# Patient Record
Sex: Male | Born: 1935 | Race: White | Hispanic: No | Marital: Married | Smoking: Former smoker
Health system: Southern US, Community
[De-identification: ages and names within clinical notes are randomized; demographics above are authoritative.]

## PROBLEM LIST (undated history)

## (undated) DIAGNOSIS — E785 Hyperlipidemia, unspecified: Secondary | ICD-10-CM

## (undated) DIAGNOSIS — Z860101 Personal history of adenomatous and serrated colon polyps: Secondary | ICD-10-CM

## (undated) DIAGNOSIS — J302 Other seasonal allergic rhinitis: Secondary | ICD-10-CM

## (undated) DIAGNOSIS — E669 Obesity, unspecified: Secondary | ICD-10-CM

## (undated) DIAGNOSIS — E119 Type 2 diabetes mellitus without complications: Secondary | ICD-10-CM

## (undated) DIAGNOSIS — L409 Psoriasis, unspecified: Secondary | ICD-10-CM

## (undated) DIAGNOSIS — N529 Male erectile dysfunction, unspecified: Secondary | ICD-10-CM

## (undated) DIAGNOSIS — R059 Cough, unspecified: Secondary | ICD-10-CM

## (undated) DIAGNOSIS — F17200 Nicotine dependence, unspecified, uncomplicated: Secondary | ICD-10-CM

## (undated) DIAGNOSIS — H3530 Unspecified macular degeneration: Secondary | ICD-10-CM

## (undated) DIAGNOSIS — R233 Spontaneous ecchymoses: Secondary | ICD-10-CM

## (undated) DIAGNOSIS — I10 Essential (primary) hypertension: Secondary | ICD-10-CM

## (undated) DIAGNOSIS — C4491 Basal cell carcinoma of skin, unspecified: Secondary | ICD-10-CM

## (undated) DIAGNOSIS — R05 Cough: Secondary | ICD-10-CM

## (undated) DIAGNOSIS — R238 Other skin changes: Secondary | ICD-10-CM

## (undated) DIAGNOSIS — Z86010 Personal history of colonic polyps: Secondary | ICD-10-CM

## (undated) HISTORY — DX: Hyperlipidemia, unspecified: E78.5

## (undated) HISTORY — PX: TONSILLECTOMY: SUR1361

## (undated) HISTORY — DX: Essential (primary) hypertension: I10

## (undated) HISTORY — DX: Nicotine dependence, unspecified, uncomplicated: F17.200

## (undated) HISTORY — DX: Personal history of adenomatous and serrated colon polyps: Z86.0101

## (undated) HISTORY — PX: POLYPECTOMY: SHX149

## (undated) HISTORY — DX: Psoriasis, unspecified: L40.9

## (undated) HISTORY — DX: Basal cell carcinoma of skin, unspecified: C44.91

## (undated) HISTORY — DX: Personal history of colonic polyps: Z86.010

## (undated) HISTORY — DX: Obesity, unspecified: E66.9

## (undated) HISTORY — DX: Male erectile dysfunction, unspecified: N52.9

---

## 1991-06-29 HISTORY — PX: SKIN CANCER EXCISION: SHX779

## 2003-06-29 HISTORY — PX: COLONOSCOPY: SHX174

## 2003-10-28 LAB — HM COLONOSCOPY

## 2006-04-21 ENCOUNTER — Ambulatory Visit: Payer: Self-pay | Admitting: Family Medicine

## 2007-11-02 ENCOUNTER — Ambulatory Visit: Payer: Self-pay | Admitting: Family Medicine

## 2008-12-02 ENCOUNTER — Ambulatory Visit: Payer: Self-pay | Admitting: Family Medicine

## 2009-01-28 ENCOUNTER — Ambulatory Visit: Payer: Self-pay | Admitting: Family Medicine

## 2009-02-04 ENCOUNTER — Ambulatory Visit: Payer: Self-pay | Admitting: Family Medicine

## 2009-04-22 ENCOUNTER — Ambulatory Visit: Payer: Self-pay | Admitting: Family Medicine

## 2009-09-16 ENCOUNTER — Ambulatory Visit: Payer: Self-pay | Admitting: Family Medicine

## 2010-01-07 ENCOUNTER — Ambulatory Visit: Payer: Self-pay | Admitting: Family Medicine

## 2010-08-03 ENCOUNTER — Ambulatory Visit: Payer: Self-pay | Admitting: Adult Health

## 2011-05-12 ENCOUNTER — Other Ambulatory Visit (INDEPENDENT_AMBULATORY_CARE_PROVIDER_SITE_OTHER): Payer: Medicare Other

## 2011-05-12 DIAGNOSIS — Z23 Encounter for immunization: Secondary | ICD-10-CM

## 2011-06-29 HISTORY — PX: CATARACT EXTRACTION, BILATERAL: SHX1313

## 2011-10-05 ENCOUNTER — Encounter: Payer: Self-pay | Admitting: Internal Medicine

## 2011-10-14 ENCOUNTER — Ambulatory Visit (INDEPENDENT_AMBULATORY_CARE_PROVIDER_SITE_OTHER): Payer: Medicare Other | Admitting: Family Medicine

## 2011-10-14 ENCOUNTER — Encounter: Payer: Self-pay | Admitting: Family Medicine

## 2011-10-14 ENCOUNTER — Telehealth: Payer: Self-pay | Admitting: Internal Medicine

## 2011-10-14 VITALS — BP 150/90 | HR 80 | Ht 73.0 in | Wt 245.0 lb

## 2011-10-14 DIAGNOSIS — Z720 Tobacco use: Secondary | ICD-10-CM | POA: Insufficient documentation

## 2011-10-14 DIAGNOSIS — M25519 Pain in unspecified shoulder: Secondary | ICD-10-CM

## 2011-10-14 DIAGNOSIS — Z86010 Personal history of colonic polyps: Secondary | ICD-10-CM

## 2011-10-14 DIAGNOSIS — J301 Allergic rhinitis due to pollen: Secondary | ICD-10-CM

## 2011-10-14 DIAGNOSIS — Z860101 Personal history of adenomatous and serrated colon polyps: Secondary | ICD-10-CM

## 2011-10-14 DIAGNOSIS — E785 Hyperlipidemia, unspecified: Secondary | ICD-10-CM

## 2011-10-14 DIAGNOSIS — E669 Obesity, unspecified: Secondary | ICD-10-CM

## 2011-10-14 DIAGNOSIS — F17200 Nicotine dependence, unspecified, uncomplicated: Secondary | ICD-10-CM

## 2011-10-14 DIAGNOSIS — I10 Essential (primary) hypertension: Secondary | ICD-10-CM

## 2011-10-14 DIAGNOSIS — E1169 Type 2 diabetes mellitus with other specified complication: Secondary | ICD-10-CM | POA: Insufficient documentation

## 2011-10-14 DIAGNOSIS — Z87891 Personal history of nicotine dependence: Secondary | ICD-10-CM | POA: Insufficient documentation

## 2011-10-14 DIAGNOSIS — M25512 Pain in left shoulder: Secondary | ICD-10-CM

## 2011-10-14 DIAGNOSIS — L408 Other psoriasis: Secondary | ICD-10-CM

## 2011-10-14 DIAGNOSIS — E1159 Type 2 diabetes mellitus with other circulatory complications: Secondary | ICD-10-CM | POA: Insufficient documentation

## 2011-10-14 DIAGNOSIS — L409 Psoriasis, unspecified: Secondary | ICD-10-CM | POA: Insufficient documentation

## 2011-10-14 LAB — COMPREHENSIVE METABOLIC PANEL
Albumin: 4.7 g/dL (ref 3.5–5.2)
BUN: 15 mg/dL (ref 6–23)
CO2: 26 mEq/L (ref 19–32)
Calcium: 9.3 mg/dL (ref 8.4–10.5)
Glucose, Bld: 100 mg/dL — ABNORMAL HIGH (ref 70–99)
Potassium: 4.6 mEq/L (ref 3.5–5.3)
Sodium: 138 mEq/L (ref 135–145)
Total Protein: 7 g/dL (ref 6.0–8.3)

## 2011-10-14 LAB — CBC WITH DIFFERENTIAL/PLATELET
Lymphocytes Relative: 33 % (ref 12–46)
Lymphs Abs: 2 10*3/uL (ref 0.7–4.0)
MCV: 94.8 fL (ref 78.0–100.0)
Neutro Abs: 3 10*3/uL (ref 1.7–7.7)
Neutrophils Relative %: 52 % (ref 43–77)
Platelets: 272 10*3/uL (ref 150–400)
RBC: 4.84 MIL/uL (ref 4.22–5.81)
WBC: 5.9 10*3/uL (ref 4.0–10.5)

## 2011-10-14 LAB — LIPID PANEL
Cholesterol: 201 mg/dL — ABNORMAL HIGH (ref 0–200)
HDL: 38 mg/dL — ABNORMAL LOW (ref >39)
Triglycerides: 186 mg/dL — ABNORMAL HIGH (ref <150)

## 2011-10-14 MED ORDER — AMLODIPINE BESYLATE 5 MG PO TABS: 5.0000 mg | ORAL_TABLET | Freq: Every day | ORAL | Status: DC

## 2011-10-14 MED ORDER — PIROXICAM 20 MG PO CAPS: 20.0000 mg | ORAL_CAPSULE | Freq: Every day | ORAL | Status: DC

## 2011-10-14 NOTE — Telephone Encounter (Signed)
[place] please schedule this pt to see Dr. Russella Dar. Leone Payor and Russella Dar approved the switch.

## 2011-10-14 NOTE — Telephone Encounter (Signed)
Dr. Russella Dar will you accept this patient? See request below. Please advise.

## 2011-10-14 NOTE — Progress Notes (Signed)
Subjective:    Patient ID: Alex Edwards, male    DOB: [birth year], 76 y.o.   MRN: [id number]  HPI He is here for an interval evaluation. He has not been here in several years. He was on medications for blood pressure but stopped due to unacceptable side effects. He does complain of left shoulder pain he states he's had for 25 years. He was told that there was some calcium in it and he states that Feldene use for approximately one month in the past did help with this. He states the pain does get better when he slightly abducts it. He also has a left neck mass that has been there for over 20 years. He states that it does tend to come and go but never go away completely. He does have underlying psoriasis and uses over-the-counter medications for this. He does have underlying allergies and treats this with OTC medications. He does not complain of any erectile dysfunction. He continues to smoke and has no desire to quit. His marriage is going well. He would like to get his eyes checked since it has been quite some time.   Review of Systems Negative except as above.    Objective:   Physical Exam BP 150/90  Pulse 80  Ht 6\' 1"  (1.854 m)  Wt 245 lb (111.131 kg)  BMI 32.32 kg/m2  General Appearance:    Alert, cooperative, no distress, appears stated age  Head:    Normocephalic, without obvious abnormality, atraumatic  Eyes:    PERRL, conjunctiva/corneas clear, EOM's intact, fundi    benign  Ears:    Normal TM's and external ear canals  Nose:   Nares normal, mucosa normal, no drainage or sinus   tenderness  Throat:   Lips, mucosa, and tongue normal; teeth and gums normal  Neck:   Supple, no lymphadenopathy;  thyroid:  no   enlargement/tenderness/nodules; no carotid   bruit or JVD.left neck area has a 3 x 5 cm smooth round fluctuant lesion   Back:    Spine nontender, no curvature, ROM normal, no CVA     tenderness  Lungs:     Clear to auscultation bilaterally without wheezes, rales or     ronchi;  respirations unlabored  Chest Wall:    No tenderness or deformity   Heart:    Regular rate and rhythm, S1 and S2 normal, no murmur, rub   or gallop  Breast Exam:    No chest wall tenderness, masses or gynecomastia  Abdomen:     Soft, non-tender, nondistended, normoactive bowel sounds,    no masses, no hepatosplenomegaly  Genitalia:   deferred   Rectal:   deferred   Extremities:   No clubbing, cyanosis or edema.left shoulder exam shows good motion without crepitus or laxity.   Pulses:   2+ and symmetric all extremities  Skin:   Skin color, texture, turgor normal, no rashes or lesions.2 cm somewhat firm round smooth lesion noted on the mid upper back area.   Lymph nodes:   Cervical, supraclavicular, and axillary nodes normal  Neurologic:   CNII-XII intact, normal strength, sensation and gait; reflexes 2+ and symmetric throughout          Psych:   Normal mood, affect, hygiene and grooming.           Assessment & Plan:   1. Hypertension  Ambulatory referral to Ophthalmology, amLODipine (NORVASC) 5 MG tablet, CBC with Differential, Comprehensive metabolic panel, Lipid panel  2. Hyperlipidemia LDL goal <  100  Lipid panel  3. Psoriasis  CBC with Differential, Comprehensive metabolic panel  4. Obesity (BMI 30-39.9)  CBC with Differential, Comprehensive metabolic panel, Lipid panel  5. Allergic rhinitis due to pollen    6. Current smoker    7. Hx of adenomatous colonic polyps  [facility] COLONOSCOPY  8. Left shoulder pain  piroxicam (FELDENE) 20 MG capsule  9  left neck mass                                                      thus further options concerning this with him and he would prefer not to pursue any further evaluation at this time. He is to let me know if he has any difficulty with his blood pressure medication. At this time he has no interest in quitting smoking.

## 2011-10-14 NOTE — Telephone Encounter (Signed)
OK with me.

## 2011-10-14 NOTE — Patient Instructions (Signed)
Use the Feldene for one month to see if this will help with your shoulder pain. If not, we will get an x-ray. If you have problems with the blood pressure medication, let me know otherwise I will see you in one month.

## 2011-10-14 NOTE — Telephone Encounter (Signed)
Sure

## 2011-10-14 NOTE — Telephone Encounter (Signed)
Dr. Leone Payor, pt would like to switch care to Dr. Russella Dar because that is the doctor his spouse sees. Will you approve the switch? Please advise.

## 2011-10-15 ENCOUNTER — Encounter: Payer: Self-pay | Admitting: Gastroenterology

## 2011-10-15 NOTE — Telephone Encounter (Signed)
Pt scheduled with Russella Dar for colonoscopy and previsit

## 2011-11-10 ENCOUNTER — Encounter: Payer: Self-pay | Admitting: Gastroenterology

## 2011-11-10 ENCOUNTER — Ambulatory Visit (AMBULATORY_SURGERY_CENTER): Payer: Medicare Other | Admitting: *Deleted

## 2011-11-10 VITALS — Ht 73.0 in | Wt 246.5 lb

## 2011-11-10 DIAGNOSIS — Z1211 Encounter for screening for malignant neoplasm of colon: Secondary | ICD-10-CM

## 2011-11-10 MED ORDER — MOVIPREP 100 G PO SOLR: ORAL | Status: DC

## 2011-11-17 ENCOUNTER — Ambulatory Visit (INDEPENDENT_AMBULATORY_CARE_PROVIDER_SITE_OTHER): Payer: Medicare Other | Admitting: Family Medicine

## 2011-11-17 ENCOUNTER — Encounter: Payer: Self-pay | Admitting: Family Medicine

## 2011-11-17 VITALS — BP 136/78 | HR 87 | Wt 247.0 lb

## 2011-11-17 DIAGNOSIS — Z79899 Other long term (current) drug therapy: Secondary | ICD-10-CM

## 2011-11-17 DIAGNOSIS — I10 Essential (primary) hypertension: Secondary | ICD-10-CM

## 2011-11-17 NOTE — Progress Notes (Signed)
  Subjective:    Patient ID: Alex Edwards, male    DOB: Feb 14, 1936, 76 y.o.   MRN: [id number]  HPI He is here for recheck. His had no difficulty from taking Norvasc.   Review of Systems     Objective:   Physical Exam Alert and in no distress. Blood pressure is recorded.       Assessment & Plan:   1. Hypertension   2. Encounter for long-term (current) use of other medications    continue on present medication dosing. Recheck this fall for flu shot

## 2011-11-24 ENCOUNTER — Ambulatory Visit (AMBULATORY_SURGERY_CENTER): Payer: Medicare Other | Admitting: Gastroenterology

## 2011-11-24 ENCOUNTER — Other Ambulatory Visit: Payer: Self-pay | Admitting: Gastroenterology

## 2011-11-24 ENCOUNTER — Encounter: Payer: Self-pay | Admitting: Gastroenterology

## 2011-11-24 VITALS — BP 113/66 | HR 85 | Temp 97.7°F | Resp 20 | Ht 73.0 in | Wt 246.0 lb

## 2011-11-24 DIAGNOSIS — Z860100 Personal history of colon polyps, unspecified: Secondary | ICD-10-CM

## 2011-11-24 DIAGNOSIS — D126 Benign neoplasm of colon, unspecified: Secondary | ICD-10-CM

## 2011-11-24 DIAGNOSIS — Z1211 Encounter for screening for malignant neoplasm of colon: Secondary | ICD-10-CM

## 2011-11-24 DIAGNOSIS — D129 Benign neoplasm of anus and anal canal: Secondary | ICD-10-CM

## 2011-11-24 DIAGNOSIS — D128 Benign neoplasm of rectum: Secondary | ICD-10-CM

## 2011-11-24 MED ORDER — SODIUM CHLORIDE 0.9 % IV SOLN: 500.0000 mL | INTRAVENOUS | Status: DC

## 2011-11-24 NOTE — Progress Notes (Signed)
Patient did not have preoperative order for IV antibiotic SSI prophylaxis. (G8918)  Patient did not experience any of the following events: a burn prior to discharge; a fall within the facility; wrong site/side/patient/procedure/implant event; or a hospital transfer or hospital admission upon discharge from the facility. (G8907)  

## 2011-11-24 NOTE — Patient Instructions (Signed)
YOU HAD AN ENDOSCOPIC PROCEDURE TODAY AT THE [facility]: Refer to the procedure report that was given to you for any specific questions about what was found during the examination.  If the procedure report does not answer your questions, please call your gastroenterologist to clarify.  If you requested that your care partner not be given the details of your procedure findings, then the procedure report has been included in a sealed envelope for you to review at your convenience later.  YOU SHOULD EXPECT: Some feelings of bloating in the abdomen. Passage of more gas than usual.  Walking can help get rid of the air that was put into your GI tract during the procedure and reduce the bloating. If you had a lower endoscopy (such as a colonoscopy or flexible sigmoidoscopy) you may notice spotting of blood in your stool or on the toilet paper. If you underwent a bowel prep for your procedure, then you may not have a normal bowel movement for a few days.  DIET: Your first meal following the procedure should be a light meal and then it is ok to progress to your normal diet.  A half-sandwich or bowl of soup is an example of a good first meal.  Heavy or fried foods are harder to digest and may make you feel nauseous or bloated.  Likewise meals heavy in dairy and vegetables can cause extra gas to form and this can also increase the bloating.  Drink plenty of fluids but you should avoid alcoholic beverages for 24 hours.  ACTIVITY: Your care partner should take you home directly after the procedure.  You should plan to take it easy, moving slowly for the rest of the day.  You can resume normal activity the day after the procedure however you should NOT DRIVE or use heavy machinery for 24 hours (because of the sedation medicines used during the test).    SYMPTOMS TO REPORT IMMEDIATELY: A gastroenterologist can be reached at any hour.  During normal business hours, 8:30 AM to 5:00 PM Monday through Friday,  call [phone].  After hours and on weekends, please call the GI answering service at [phone] who will take a message and have the physician on call contact you.   Following lower endoscopy (colonoscopy or flexible sigmoidoscopy):  Excessive amounts of blood in the stool  Significant tenderness or worsening of abdominal pains  Swelling of the abdomen that is new, acute  Fever of 100F or higher    FOLLOW UP: If any biopsies were taken you will be contacted by phone or by letter within the next 1-3 weeks.  Call your gastroenterologist if you have not heard about the biopsies in 3 weeks.  Our staff will call the home number listed on your records the next business day following your procedure to check on you and address any questions or concerns that you may have at that time regarding the information given to you following your procedure. This is a courtesy call and so if there is no answer at the home number and we have not heard from you through the emergency physician on call, we will assume that you have returned to your regular daily activities without incident.  SIGNATURES/CONFIDENTIALITY: You and/or your care partner have signed paperwork which will be entered into your electronic medical record.  These signatures attest to the fact that that the information above on your After Visit Summary has been reviewed and is understood.  Full responsibility of the confidentiality   of this discharge information lies with you and/or your care-partner.     

## 2011-11-24 NOTE — Op Note (Signed)
[facility] [address] [place], [place]  [address]  COLONOSCOPY PROCEDURE REPORT PATIENT:  Alex Edwards, Alex Edwards  MR#:  [id number] BIRTHDATE:  [birth year], 76 yrs. old  GENDER:  male ENDOSCOPIST:  Judie Petit T. Russella Dar, MD, [facility]  PROCEDURE DATE:  11/24/2011 PROCEDURE:  Colonoscopy with snare polypectomy ASA CLASS:  Class II INDICATIONS:  1) surveillance and high-risk screening  2) history of pre-cancerous (adenomatous) colon polyps : 2005 MEDICATIONS:   MAC sedation, administered by CRNA, propofol (Diprivan) 200 mg IV DESCRIPTION OF PROCEDURE:   After the risks benefits and alternatives of the procedure were thoroughly explained, informed consent was obtained.  Digital rectal exam was performed and revealed no abnormalities.   The LB CF-[id number] endoscope was introduced through the anus and advanced to the cecum, which was identified by both the appendix and ileocecal valve, without limitations.  The quality of the prep was good, using MoviPrep. The instrument was then slowly withdrawn as the colon was fully examined. <<PROCEDUREIMAGES>> FINDINGS:  A sessile polyp was found in the cecum. It was 6 mm in size. Polyp was snared without cautery. Retrieval was successful. A sessile polyp was found in the sigmoid colon. It was 4 mm in size. Polyp was snared without cautery. Retrieval was successful. A pedunculated polyp was found in the rectum. It was 12 mm in size. Polyp was snared, then cauterized with monopolar cautery. Retrieval was successful. Moderate diverticulosis was found in the sigmoid colon. Otherwise normal colonoscopy without other polyps, masses, vascular ectasias, or inflammatory changes. Retroflexed views in the rectum revealed no abnormalities.  The time to cecum =  2.75  minutes. The scope was then withdrawn (time =  11.75 min) from the patient and the procedure completed. COMPLICATIONS:  None  ENDOSCOPIC IMPRESSION: 1) 6 mm sessile polyp in the cecum 2) 4 mm  sessile polyp in the sigmoid colon 3) 12 mm pedunculated polyp in the rectum 4) Moderate diverticulosis in the sigmoid colon  RECOMMENDATIONS: 1) Hold aspirin, aspirin products, and anti-inflammatory medication for 2 weeks. 2) Await pathology results 3) High fiber diet with liberal fluid intake. 4) Repeat Colonoscopy in 3 years.  Venita Lick. Russella Dar, MD, [facility]  CC:  Sharlot Gowda, MD  n. Rosalie DoctorVenita Lick. Tyrena Gohr at 11/24/2011 11:23 AM  Dorie Rank, [phone]

## 2011-11-25 ENCOUNTER — Telehealth: Payer: Self-pay | Admitting: *Deleted

## 2011-11-25 NOTE — Telephone Encounter (Signed)
  Follow up Call-  Call back number 11/24/2011  Post procedure Call Back phone  # [phone]  Permission to leave phone message Yes     Patient questions:  Do you have a fever, pain , or abdominal swelling? no Pain Score  0 *  Have you tolerated food without any problems? yes  Have you been able to return to your normal activities? yes  Do you have any questions about your discharge instructions: Diet   no Medications  no Follow up visit  no  Do you have questions or concerns about your Care? no  Actions: * If pain score is 4 or above: No action needed, pain <4.

## 2011-11-29 ENCOUNTER — Encounter: Payer: Self-pay | Admitting: Gastroenterology

## 2012-02-11 ENCOUNTER — Ambulatory Visit (INDEPENDENT_AMBULATORY_CARE_PROVIDER_SITE_OTHER): Payer: Medicare Other | Admitting: Medical

## 2012-02-11 ENCOUNTER — Encounter: Payer: Self-pay | Admitting: Medical

## 2012-02-11 VITALS — BP 140/80 | HR 93 | Temp 98.1°F | Resp 16 | Wt 244.0 lb

## 2012-02-11 DIAGNOSIS — Z23 Encounter for immunization: Secondary | ICD-10-CM

## 2012-02-11 DIAGNOSIS — S91109A Unspecified open wound of unspecified toe(s) without damage to nail, initial encounter: Secondary | ICD-10-CM

## 2012-02-11 DIAGNOSIS — S91139A Puncture wound without foreign body of unspecified toe(s) without damage to nail, initial encounter: Secondary | ICD-10-CM

## 2012-02-11 MED ORDER — AMOXICILLIN-POT CLAVULANATE 875-125 MG PO TABS: 1.0000 | ORAL_TABLET | Freq: Two times a day (BID) | ORAL | Status: AC

## 2012-02-11 NOTE — Progress Notes (Signed)
Subjective: Here for wound.  He was out in the shrubs at his house this morning and stepped on a roofing nail.  He just had his roof replaced recently.  The nail entered his left great toe.  He cleaned the toe and came in here for tetanus booster.   No other c/o.  He does report having right eye cataract surgery recently and had an injection in his eye for macular degeneration.    ROS as noted in HPI  Objective: Gen: wd, wn, nad Skin: left great toe volar surface with 2mm diameter puncture wound.  Some bloody drainage.  No pus, erythema, induration, fluctuance.  Rest of toes intact.  Normal pedal pulse and normal cap refill  Assessment: Encounter Diagnoses  Name Primary?  . Puncture wound of toe Yes  . Need for Tdap vaccination    Plan: Puncture wound -advised he clean the area with soap and water, use epsom salt soaks, updated Tdap today.  Wrote script for Augmentin in the event he begins to have signs of infection as discussed.  otherwise elevate the leg, and call/return if worse or not improving.

## 2012-03-22 ENCOUNTER — Ambulatory Visit: Payer: Medicare Other | Admitting: Family Medicine

## 2012-03-28 ENCOUNTER — Ambulatory Visit
Admission: RE | Admit: 2012-03-28 | Discharge: 2012-03-28 | Disposition: A | Discharge status: Home / Self Care | Payer: Medicare Other | Source: Ambulatory Visit | Attending: Family Medicine | Admitting: Family Medicine

## 2012-03-28 ENCOUNTER — Encounter: Payer: Self-pay | Admitting: Family Medicine

## 2012-03-28 ENCOUNTER — Ambulatory Visit (INDEPENDENT_AMBULATORY_CARE_PROVIDER_SITE_OTHER): Payer: Medicare Other | Admitting: Family Medicine

## 2012-03-28 VITALS — BP 140/80 | HR 84 | Wt 247.0 lb

## 2012-03-28 DIAGNOSIS — M25552 Pain in left hip: Secondary | ICD-10-CM

## 2012-03-28 DIAGNOSIS — N529 Male erectile dysfunction, unspecified: Secondary | ICD-10-CM

## 2012-03-28 DIAGNOSIS — M25559 Pain in unspecified hip: Secondary | ICD-10-CM

## 2012-03-28 DIAGNOSIS — F17200 Nicotine dependence, unspecified, uncomplicated: Secondary | ICD-10-CM

## 2012-03-28 DIAGNOSIS — Z23 Encounter for immunization: Secondary | ICD-10-CM

## 2012-03-28 MED ORDER — INFLUENZA VIRUS VACC SPLIT PF IM SUSP: 0.5000 mL | Freq: Once | INTRAMUSCULAR | Status: DC

## 2012-03-28 MED ORDER — SILDENAFIL CITRATE 100 MG PO TABS: 50.0000 mg | ORAL_TABLET | Freq: Every day | ORAL | Status: DC | PRN

## 2012-03-28 NOTE — Progress Notes (Signed)
  Subjective:    Patient ID: Alex Edwards, male    DOB: 1936-02-26, 76 y.o.   MRN: [id number]  HPI He is here for a followup visit. He has noted increased difficulty with left hip pain but does get relief with using Aleve on an as-needed basis. He also has had difficulty with left shoulder pain and states he has a previous history of calcific tendinitis. At this time he is not having a great deal of difficulty with ADLs. He also has erectile dysfunction and would like to try Viagra. He has tried Levitra in the past without success. He continues to smoke and at this time is not interested in quitting.  Review of Systems     Objective:   Physical Exam Alert and in no distress. Good motion of the shoulder. No palpable tenderness noted. Hip motion shows some lack of abduction but otherwise good motion.       Assessment & Plan:   1. Need for prophylactic vaccination and inoculation against influenza  influenza  inactive virus vaccine (FLUZONE/FLUARIX) injection 0.5 mL, influenza  inactive virus vaccine (FLUZONE/FLUARIX) injection 0.5 mL  2. Current smoker    3. ED (erectile dysfunction)  sildenafil (VIAGRA) 100 MG tablet  4. Left hip pain  DG Hip Complete Left   hip x-ray shows mild degenerative changes. Recommend conservative care. Also recommend conservative care for the shoulder which is in agreement with. He is not interested in quitting smoking. Sample of Viagra with instructions on use and possible side effects was given.

## 2012-03-28 NOTE — Patient Instructions (Signed)
You can take 2 Tylenol 4 times per day if you need to for the pain as well as Aleve

## 2012-04-10 ENCOUNTER — Telehealth: Payer: Self-pay | Admitting: Family Medicine

## 2012-04-10 ENCOUNTER — Other Ambulatory Visit: Payer: Self-pay | Admitting: Family Medicine

## 2012-04-10 NOTE — Telephone Encounter (Signed)
Pt request [facility]

## 2012-04-10 NOTE — Telephone Encounter (Signed)
If he is having as much difficulty, refer him to orthopedics

## 2012-04-10 NOTE — Telephone Encounter (Signed)
Pt called and stated that he is still having a lot of pain with is hip. He states that he needs "releif". Pt want to know what next. Are you going to treat him or does he need to go to a specialist? Please call pt. Pt uses [facility].

## 2012-06-26 ENCOUNTER — Encounter: Payer: Self-pay | Admitting: Medical

## 2012-06-26 ENCOUNTER — Ambulatory Visit (INDEPENDENT_AMBULATORY_CARE_PROVIDER_SITE_OTHER): Payer: Medicare Other | Admitting: Medical

## 2012-06-26 VITALS — BP 152/70 | HR 80 | Temp 98.0°F | Resp 16 | Wt 259.0 lb

## 2012-06-26 DIAGNOSIS — R21 Rash and other nonspecific skin eruption: Secondary | ICD-10-CM

## 2012-06-26 MED ORDER — METHYLPREDNISOLONE ACETATE 80 MG/ML IJ SUSP
60.0000 mg | Freq: Once | INTRAMUSCULAR | Status: AC
  Administered 2012-06-26: 60 mg via INTRAMUSCULAR

## 2012-06-26 MED ORDER — HYDROCORTISONE 2.5 % EX CREA: TOPICAL_CREAM | Freq: Two times a day (BID) | CUTANEOUS | Status: DC

## 2012-06-26 NOTE — Progress Notes (Signed)
Subjective: Here for rash on both hands.  He notes hx/o psoriasis, usually with problems of scalp and elbows, but for the last few weeks having rash on both hands, raw red and irritated.  He has been using selsun blue shampoo on scalp and hand rash seemed to get worse after this.   He also had an Fish farm manager at his house recently on Christmas eve, used Government social research officer and the right hand rash worsened after this.   Used some halobetasol but this causes skin to peel off.   He sees Dr. Margo Aye dermatology in the past and had similar hand rash in the past that cleared with steroid shot which is what he requests today.  other prior improvements include "glove in a bottle" lotion.   Objective: Gen: wd, wn, nad Skin: bilat hands dorsla and volar surfaces throughout with erythema, some flaky/scaly skin of dorsal fingers, but mostly irritated inflamed appearing fingers and hands.     Assessment: Encounter Diagnosis  Name Primary?  . Rash Yes   Plan: Depo medrol 60mg  IM given today, script for Hydrocortisone 2.5% cream topically advised to hands.   In general use warm gloves when out in the cold. . Use daily moisturizing lotion such as gloves in a bottle or lubriderm.  Avoid cold temperatures in general.   Recheck if worse or not improving.

## 2012-07-10 ENCOUNTER — Encounter: Payer: Self-pay | Admitting: Medical

## 2012-07-10 ENCOUNTER — Ambulatory Visit (INDEPENDENT_AMBULATORY_CARE_PROVIDER_SITE_OTHER): Payer: Medicare Other | Admitting: Medical

## 2012-07-10 VITALS — BP 130/70 | HR 72 | Temp 97.8°F | Resp 16 | Wt 245.0 lb

## 2012-07-10 DIAGNOSIS — R21 Rash and other nonspecific skin eruption: Secondary | ICD-10-CM

## 2012-07-10 DIAGNOSIS — R221 Localized swelling, mass and lump, neck: Secondary | ICD-10-CM

## 2012-07-10 DIAGNOSIS — M129 Arthropathy, unspecified: Secondary | ICD-10-CM

## 2012-07-10 DIAGNOSIS — J40 Bronchitis, not specified as acute or chronic: Secondary | ICD-10-CM

## 2012-07-10 DIAGNOSIS — R220 Localized swelling, mass and lump, head: Secondary | ICD-10-CM

## 2012-07-10 DIAGNOSIS — M1990 Unspecified osteoarthritis, unspecified site: Secondary | ICD-10-CM

## 2012-07-10 MED ORDER — CLARITHROMYCIN ER 500 MG PO TB24: 1000.0000 mg | ORAL_TABLET | Freq: Every day | ORAL | Status: DC

## 2012-07-10 MED ORDER — ACETAMINOPHEN-CODEINE #3 300-30 MG PO TABS: ORAL_TABLET | ORAL | Status: DC

## 2012-07-10 MED ORDER — METHYLPREDNISOLONE ACETATE 40 MG/ML INJ SUSP (RADIOLOG
120.0000 mg | Freq: Once | INTRAMUSCULAR | Status: AC
  Administered 2012-07-10: 120 mg via INTRAMUSCULAR

## 2012-07-10 MED ORDER — AZITHROMYCIN 250 MG PO TABS: ORAL_TABLET | ORAL | Status: DC

## 2012-07-10 NOTE — Progress Notes (Signed)
I called out [facility] per Crosby Oyster PA-C because the Biaxin medication cost to much. CLS

## 2012-07-10 NOTE — Progress Notes (Signed)
Subjective: Here for 2+ week hx/o cough, chest congestion, some colored sputum.  Wife has same.  She was seen by me today for the same.   Neither of them seem to be getting over it.   She is a little worse than him.    He wants refill on tylenol #3.  Uses occasionally for arthritis pain, not daily though.    Still has rash on hands.   After last visit had some improvement, but not as good as in the past with stronger steroid shot.  He did use the hydrocortisone cream.   Would like another round of shot. Has hx/o psoriasis, usually with problems of scalp and elbows, but for the last few weeks having rash on both hands, raw red and irritated.    Has lump of left neck x 12 years.  curious about this.   Doesn't bother him.  Had discussed with Dr. Susann Givens before, but just curious about this.    Objective: Gen: wd, wn, nad Heent: sinus nontender, TMs pearly, nares patent, pharynx normal Heart: RRR, normal S1, S2, no mumrus Lungs; left lower rhonchi, no rales, no wheezes, no consolidations or dullness to percussion Neck: left neck with 4+cm round large mass, seems round and cystic like, possible cyst or enlarged lymph node, nontender, but no erythema, warmth, fluctuance, otherwise no thyromegaly or mass, nontender Skin: bilat hands dorsal and volar surfaces throughout with erythema, some flaky/scaly skin of dorsal fingers, but mostly irritated inflamed appearing fingers and hands.     Assessment: Encounter Diagnoses  Name Primary?  . Bronchitis Yes  . Mass of left side of neck   . Rash of hands   . Arthritis    Plan: Bronchitis - shot of depo medrol today, begin Biaxin, call/return if not much improved by end of the week  Mass of neck - offered soft tissue ultrasound, but he declines today.   Rash of hands - depo medrol 120mg  today.  F/u with dermatology if this doesn't clear.  C/t daily moisturizing lotion, wears gloves in the cold  Arthritis - refill on tylenol #3.  Return soon for  physical

## 2012-07-10 NOTE — Addendum Note (Signed)
Addended by: Janeice Robinson on: 07/10/2012 03:29 PM   Modules accepted: Orders

## 2012-09-08 ENCOUNTER — Encounter: Payer: Self-pay | Admitting: Family Medicine

## 2012-09-08 ENCOUNTER — Ambulatory Visit: Payer: Medicare Other | Admitting: Family Medicine

## 2012-09-08 VITALS — BP 170/80 | HR 80 | Wt 249.0 lb

## 2012-09-08 DIAGNOSIS — L258 Unspecified contact dermatitis due to other agents: Secondary | ICD-10-CM

## 2012-09-08 DIAGNOSIS — L309 Dermatitis, unspecified: Secondary | ICD-10-CM

## 2012-09-08 MED ORDER — METHYLPREDNISOLONE SODIUM SUCC 125 MG IJ SOLR: 125.0000 mg | Freq: Once | INTRAMUSCULAR | Status: DC

## 2012-09-08 MED ORDER — PREDNISONE 5 MG PO KIT: PACK | ORAL | Status: DC

## 2012-09-08 MED ORDER — METHYLPREDNISOLONE SODIUM SUCC 125 MG IJ SOLR
125.0000 mg | Freq: Once | INTRAMUSCULAR | Status: AC
  Administered 2012-09-08: 125 mg via INTRAMUSCULAR

## 2012-09-08 NOTE — Progress Notes (Signed)
  Subjective:    Patient ID: Alex Edwards, male    DOB: 28-Mar-1936, 78 y.o.   MRN: [id number]  HPI He is here for evaluation of continued difficulty with redness swelling dryness and cracking of his hands. He has been seen in the past by Dr. Margo Aye. Apparently the only thing that helps clear the problem is steroids orally.   Review of Systems     Objective:   Physical Exam Both hands are erythematous slightly warm, dry with fissuring on the palmar surface.       Assessment & Plan:  Dermatitis - Plan: PredniSONE 5 MG KIT, methylPREDNISolone sodium succinate (SOLU-MEDROL) 125 mg/2 mL injection 125 mg, methylPREDNISolone sodium succinate (SOLU-MEDROL) 125 mg/2 mL injection 125 mg, DISCONTINUED: methylPREDNISolone sodium succinate (SOLU-MEDROL) 125 mg/2 mL injection, CANCELED: PR METHYLPREDNISOLONE INJECTION if he continues to have difficulty, I will refer to dermatology

## 2013-01-12 ENCOUNTER — Ambulatory Visit (INDEPENDENT_AMBULATORY_CARE_PROVIDER_SITE_OTHER): Payer: Medicare Other | Admitting: Family Medicine

## 2013-01-12 ENCOUNTER — Encounter: Payer: Self-pay | Admitting: Family Medicine

## 2013-01-12 ENCOUNTER — Telehealth: Payer: Self-pay | Admitting: Family Medicine

## 2013-01-12 VITALS — BP 150/80 | HR 82 | Temp 98.0°F | Resp 16 | Wt 248.0 lb

## 2013-01-12 DIAGNOSIS — J301 Allergic rhinitis due to pollen: Secondary | ICD-10-CM

## 2013-01-12 DIAGNOSIS — F17200 Nicotine dependence, unspecified, uncomplicated: Secondary | ICD-10-CM

## 2013-01-12 DIAGNOSIS — L409 Psoriasis, unspecified: Secondary | ICD-10-CM

## 2013-01-12 DIAGNOSIS — Z79899 Other long term (current) drug therapy: Secondary | ICD-10-CM

## 2013-01-12 DIAGNOSIS — I10 Essential (primary) hypertension: Secondary | ICD-10-CM

## 2013-01-12 DIAGNOSIS — L408 Other psoriasis: Secondary | ICD-10-CM

## 2013-01-12 NOTE — Telephone Encounter (Signed)
WILL BE SENT MONDAY

## 2013-01-12 NOTE — Progress Notes (Signed)
  Subjective:    Patient ID: Alex Edwards, male    DOB: 1936-04-17, 77 y.o.   MRN: [id number]  HPI He is here for medication check. He continues on his blood pressure medications without problem although he did stop it for a while thinking it might be contributing to his skin problem. His allergies are under good control. He continues to smoke and is not interested in quitting. He has been having difficulty with psoriasis especially of the hands. He has seen his dermatologist was placed in a Sterapred dose pack which did help however followup steroid creams apparently did not. He is interested in another steroid dose pack.   Review of Systems     Objective:   Physical Exam Alert and in no distress. Both hands show redness, scaling and dryness with some cracking. He also has psoriatic lesions present on his arms and legs.       Assessment & Plan:  Psoriasis - Plan: CBC with Differential, Comprehensive metabolic panel  Hypertension - Plan: CBC with Differential, Comprehensive metabolic panel  Allergic rhinitis due to pollen  Smoker  Encounter for long-term (current) use of other medications - Plan: CBC with Differential, Comprehensive metabolic panel  I explained that I did not feel comfortable giving him another steroid dose pack and have him check with his dermatologist. She is considering using methotrexate and I explained that I thought that would be a good option. He will continue on his other medications.

## 2013-01-13 LAB — CBC WITH DIFFERENTIAL/PLATELET
Basophils Relative: 0 % (ref 0–1)
Eosinophils Absolute: 0.2 10*3/uL (ref 0.0–0.7)
Eosinophils Relative: 5 % (ref 0–5)
Lymphs Abs: 1.5 10*3/uL (ref 0.7–4.0)
MCH: 31.4 pg (ref 26.0–34.0)
MCHC: 34.7 g/dL (ref 30.0–36.0)
MCV: 90.5 fL (ref 78.0–100.0)
Monocytes Relative: 13 % — ABNORMAL HIGH (ref 3–12)
Neutrophils Relative %: 49 % (ref 43–77)
Platelets: 237 10*3/uL (ref 150–400)
RBC: 4.72 MIL/uL (ref 4.22–5.81)

## 2013-01-13 LAB — COMPREHENSIVE METABOLIC PANEL
Alkaline Phosphatase: 58 U/L (ref 39–117)
CO2: 28 mEq/L (ref 19–32)
Creat: 0.95 mg/dL (ref 0.50–1.35)
Glucose, Bld: 98 mg/dL (ref 70–99)
Sodium: 138 mEq/L (ref 135–145)
Total Bilirubin: 0.5 mg/dL (ref 0.3–1.2)

## 2013-01-15 NOTE — Progress Notes (Signed)
Quick Note:  MAILED PT LETTER WITH LAB RESULTS PER PT REQUEST ______

## 2013-01-16 NOTE — Telephone Encounter (Signed)
DONE

## 2013-04-02 ENCOUNTER — Ambulatory Visit (INDEPENDENT_AMBULATORY_CARE_PROVIDER_SITE_OTHER): Payer: Medicare Other | Admitting: Family Medicine

## 2013-04-02 DIAGNOSIS — Z23 Encounter for immunization: Secondary | ICD-10-CM | POA: Diagnosis not present

## 2013-04-17 ENCOUNTER — Encounter: Payer: Self-pay | Admitting: Family Medicine

## 2013-04-17 ENCOUNTER — Ambulatory Visit (INDEPENDENT_AMBULATORY_CARE_PROVIDER_SITE_OTHER): Payer: Medicare Other | Admitting: Family Medicine

## 2013-04-17 VITALS — BP 132/78 | HR 108 | Temp 99.4°F | Wt 246.0 lb

## 2013-04-17 DIAGNOSIS — J209 Acute bronchitis, unspecified: Secondary | ICD-10-CM

## 2013-04-17 MED ORDER — AMOXICILLIN 875 MG PO TABS: 875.0000 mg | ORAL_TABLET | Freq: Two times a day (BID) | ORAL | Status: DC

## 2013-04-17 NOTE — Progress Notes (Signed)
  Subjective:    Patient ID: Alex Frederickson Sr., male    DOB: 01-May-1936, 77 y.o.   MRN: [id number]  HPI About 8 days ago he started having difficulty with chest congestion and coughing some fever and chills. No sore or earache. Today he did note difficulty with shortness of breath when he was in the shower . He states that he quit smoking 2 days ago.   Review of Systems     Objective:   Physical Exam alert and in no distress. Tympanic membranes and canals are normal. Throat is clear. Tonsils are normal. Neck is supple without adenopathy or thyromegaly. Cardiac exam shows a regular sinus rhythm without murmurs or gallops. Lungs are clear to auscultation.        Assessment & Plan:  Acute bronchitis - Plan: amoxicillin (AMOXIL) 875 MG tablet  he will call if not entirely better.

## 2013-04-17 NOTE — Patient Instructions (Signed)
Take all the medicine and if not any better, call me.

## 2013-04-18 ENCOUNTER — Encounter: Payer: Self-pay | Admitting: Family Medicine

## 2013-04-19 ENCOUNTER — Encounter: Payer: Self-pay | Admitting: Family Medicine

## 2013-05-14 ENCOUNTER — Other Ambulatory Visit: Payer: Self-pay | Admitting: Family Medicine

## 2013-11-23 ENCOUNTER — Other Ambulatory Visit (HOSPITAL_COMMUNITY): Payer: Self-pay | Admitting: Orthopaedic Surgery

## 2013-12-19 ENCOUNTER — Encounter (HOSPITAL_COMMUNITY): Payer: Self-pay | Admitting: Pharmacy Technician

## 2013-12-25 ENCOUNTER — Telehealth: Payer: Self-pay

## 2013-12-25 ENCOUNTER — Encounter (HOSPITAL_COMMUNITY)
Admission: RE | Admit: 2013-12-25 | Discharge: 2013-12-25 | Disposition: A | Discharge status: Home / Self Care | Payer: Medicare Other | Source: Ambulatory Visit | Attending: Orthopaedic Surgery | Admitting: Orthopaedic Surgery

## 2013-12-25 ENCOUNTER — Encounter (INDEPENDENT_AMBULATORY_CARE_PROVIDER_SITE_OTHER): Payer: Self-pay

## 2013-12-25 ENCOUNTER — Ambulatory Visit (HOSPITAL_COMMUNITY)
Admission: RE | Admit: 2013-12-25 | Discharge: 2013-12-25 | Disposition: A | Discharge status: Home / Self Care | Payer: Medicare Other | Source: Ambulatory Visit | Attending: Orthopaedic Surgery | Admitting: Orthopaedic Surgery

## 2013-12-25 ENCOUNTER — Encounter (HOSPITAL_COMMUNITY): Payer: Self-pay

## 2013-12-25 DIAGNOSIS — Z01818 Encounter for other preprocedural examination: Secondary | ICD-10-CM | POA: Insufficient documentation

## 2013-12-25 DIAGNOSIS — Z01812 Encounter for preprocedural laboratory examination: Secondary | ICD-10-CM | POA: Insufficient documentation

## 2013-12-25 DIAGNOSIS — Z01810 Encounter for preprocedural cardiovascular examination: Secondary | ICD-10-CM | POA: Insufficient documentation

## 2013-12-25 HISTORY — DX: Cough, unspecified: R05.9

## 2013-12-25 HISTORY — DX: Cough: R05

## 2013-12-25 LAB — SURGICAL PCR SCREEN
MRSA, PCR: NEGATIVE
STAPHYLOCOCCUS AUREUS: NEGATIVE

## 2013-12-25 LAB — BASIC METABOLIC PANEL
BUN: 16 mg/dL (ref 6–23)
CALCIUM: 9.5 mg/dL (ref 8.4–10.5)
CHLORIDE: 101 meq/L (ref 96–112)
CO2: 26 mEq/L (ref 19–32)
Creatinine, Ser: 0.94 mg/dL (ref 0.50–1.35)
GFR calc non Af Amer: 79 mL/min — ABNORMAL LOW (ref >90)
Glucose, Bld: 88 mg/dL (ref 70–99)
Potassium: 4.6 mEq/L (ref 3.7–5.3)
Sodium: 139 mEq/L (ref 137–147)

## 2013-12-25 LAB — URINALYSIS, ROUTINE W REFLEX MICROSCOPIC
Bilirubin Urine: NEGATIVE
Glucose, UA: NEGATIVE mg/dL
Hgb urine dipstick: NEGATIVE
Ketones, ur: NEGATIVE mg/dL
LEUKOCYTES UA: NEGATIVE
Nitrite: NEGATIVE
Protein, ur: NEGATIVE mg/dL
SPECIFIC GRAVITY, URINE: 1.003 — AB (ref 1.005–1.030)
UROBILINOGEN UA: 0.2 mg/dL (ref 0.0–1.0)
pH: 6 (ref 5.0–8.0)

## 2013-12-25 LAB — PROTIME-INR
INR: 0.92 (ref 0.00–1.49)
Prothrombin Time: 12.4 seconds (ref 11.6–15.2)

## 2013-12-25 LAB — CBC
HCT: 45.1 % (ref 39.0–52.0)
Hemoglobin: 15.6 g/dL (ref 13.0–17.0)
MCH: 31.9 pg (ref 26.0–34.0)
MCHC: 34.6 g/dL (ref 30.0–36.0)
MCV: 92.2 fL (ref 78.0–100.0)
Platelets: 237 10*3/uL (ref 150–400)
RBC: 4.89 MIL/uL (ref 4.22–5.81)
RDW: 12.4 % (ref 11.5–15.5)
WBC: 5.2 10*3/uL (ref 4.0–10.5)

## 2013-12-25 LAB — APTT: aPTT: 33 seconds (ref 24–37)

## 2013-12-25 NOTE — Pre-Procedure Instructions (Signed)
12-25-13 1635 CXR abnormal results- viewable in Epic.

## 2013-12-25 NOTE — Telephone Encounter (Signed)
CALLED AND TALKED WITH PT THAT DR.LALONDE WANTED HIM TO COME IN AND DISCUSS THE OSA PT SAID THERE WAS NOTHING TO DISCUSS IF HE WANTED HIM TO COME IN FOR SOMETHING ELSE HE WOULD BUT HE IS HAVING LEFT HIP REPLACEMENT ON 01/04/14

## 2013-12-25 NOTE — Patient Instructions (Addendum)
[place]  12/25/2013   Your procedure is scheduled on:  7-10 -2015  Enter through [facility] Entrance and follow signs to [facility]. Arrive at    0930    AM.  Call this number if you have problems the morning of surgery: [phone]  Or Presurgical Testing [phone]([place]) For Living Will and/or Health Care Power Attorney Forms: please provide copy for your medical record,may bring AM of surgery(Forms should be already notarized -we do not provide this service).(12-25-13 No information preferred today).     Do not eat food:After Midnight.    Take these medicines the morning of surgery with A SIP OF WATER: Amlodipine. Loratidine.  Pain med   Do not wear jewelry, make-up or nail polish.  Do not wear lotions, powders, or perfumes. You may wear deodorant.  Do not shave 48 hours(2 days) prior to first CHG shower(legs and under arms).(Shaving face and neck okay.)  Do not bring valuables to the hospital.(Hospital is not responsible for lost valuables).  Contacts, dentures or removable bridgework, body piercing, hair pins may not be worn into surgery.  Leave suitcase in the car. After surgery it may be brought to your room.  For patients admitted to the hospital, checkout time is 11:00 AM the day of discharge.(Restricted visitors-Any Persons displaying flu-like symptoms or illness).    Patients discharged the day of surgery will not be allowed to drive home. Must have responsible person with you x 24 hours once discharged.  Name and phone number of your driver: Cyndia Diver [phone] spouse  Special Instructions: CHG(Chlorhedine 4%-"Hibiclens","Betasept","Aplicare") Shower Use Special Wash: see special instructions.(avoid face and genitals)   Please read over the following fact sheets that you were given: MRSA Information, Blood Transfusion fact sheet, Incentive Spirometry Instruction.  Remember : Type/Screen "Blue armbands" - may not be removed once  applied(would result in being retested AM of surgery, if removed).    ________________________    [facility] - Preparing for Surgery Before surgery, you can play an important role.  Because skin is not sterile, your skin needs to be as free of germs as possible.  You can reduce the number of germs on your skin by washing with CHG (chlorahexidine gluconate) soap before surgery.  CHG is an antiseptic cleaner which kills germs and bonds with the skin to continue killing germs even after washing. Please DO NOT use if you have an allergy to CHG or antibacterial soaps.  If your skin becomes reddened/irritated stop using the CHG and inform your nurse when you arrive at Short Stay. Do not shave (including legs and underarms) for at least 48 hours prior to the first CHG shower.  You may shave your face/neck. Please follow these instructions carefully:  1.  Shower with CHG Soap the night before surgery and the  morning of Surgery.  2.  If you choose to wash your hair, wash your hair first as usual with your  normal  shampoo.  3.  After you shampoo, rinse your hair and body thoroughly to remove the  shampoo.                           4.  Use CHG as you would any other liquid soap.  You can apply chg directly  to the skin and wash  Gently with a scrungie or clean washcloth.  5.  Apply the CHG Soap to your body ONLY FROM THE NECK DOWN.   Do not use on face/ open                           Wound or open sores. Avoid contact with eyes, ears mouth and genitals (private parts).                       Wash face,  Genitals (private parts) with your normal soap.             6.  Wash thoroughly, paying special attention to the area where your surgery  will be performed.  7.  Thoroughly rinse your body with warm water from the neck down.  8.  DO NOT shower/wash with your normal soap after using and rinsing off  the CHG Soap.                9.  Pat yourself dry with a clean towel.            10.   Wear clean pajamas.            11.  Place clean sheets on your bed the night of your first shower and do not  sleep with pets. Day of Surgery : Do not apply any lotions/deodorants the morning of surgery.  Please wear clean clothes to the hospital/surgery center.  FAILURE TO FOLLOW THESE INSTRUCTIONS MAY RESULT IN THE CANCELLATION OF YOUR SURGERY PATIENT SIGNATURE_________________________________  NURSE SIGNATURE__________________________________  ________________________________________________________________________   Adam Phenix  An incentive spirometer is a tool that can help keep your lungs clear and active. This tool measures how well you are filling your lungs with each breath. Taking long deep breaths may help reverse or decrease the chance of developing breathing (pulmonary) problems (especially infection) following:  A long period of time when you are unable to move or be active. BEFORE THE PROCEDURE   If the spirometer includes an indicator to show your best effort, your nurse or respiratory therapist will set it to a desired goal.  If possible, sit up straight or lean slightly forward. Try not to slouch.  Hold the incentive spirometer in an upright position. INSTRUCTIONS FOR USE  1. Sit on the edge of your bed if possible, or sit up as far as you can in bed or on a chair. 2. Hold the incentive spirometer in an upright position. 3. Breathe out normally. 4. Place the mouthpiece in your mouth and seal your lips tightly around it. 5. Breathe in slowly and as deeply as possible, raising the piston or the ball toward the top of the column. 6. Hold your breath for 3-5 seconds or for as long as possible. Allow the piston or ball to fall to the bottom of the column. 7. Remove the mouthpiece from your mouth and breathe out normally. 8. Rest for a few seconds and repeat Steps 1 through 7 at least 10 times every 1-2 hours when you are awake. Take your time and take a few  normal breaths between deep breaths. 9. The spirometer may include an indicator to show your best effort. Use the indicator as a goal to work toward during each repetition. 10. After each set of 10 deep breaths, practice coughing to be sure your lungs are clear. If you have an incision (the cut made at the time of  surgery), support your incision when coughing by placing a pillow or rolled up towels firmly against it. Once you are able to get out of bed, walk around indoors and cough well. You may stop using the incentive spirometer when instructed by your caregiver.  RISKS AND COMPLICATIONS  Take your time so you do not get dizzy or light-headed.  If you are in pain, you may need to take or ask for pain medication before doing incentive spirometry. It is harder to take a deep breath if you are having pain. AFTER USE  Rest and breathe slowly and easily.  It can be helpful to keep track of a log of your progress. Your caregiver can provide you with a simple table to help with this. If you are using the spirometer at home, follow these instructions: [place] IF:   You are having difficultly using the spirometer.  You have trouble using the spirometer as often as instructed.  Your pain medication is not giving enough relief while using the spirometer.  You develop fever of 100.5 F (38.1 C) or higher. SEEK IMMEDIATE MEDICAL CARE IF:   You cough up bloody sputum that had not been present before.  You develop fever of 102 F (38.9 C) or greater.  You develop worsening pain at or near the incision site. MAKE SURE YOU:   Understand these instructions.  Will watch your condition.  Will get help right away if you are not doing well or get worse. Document Released: 10/25/2006 Document Revised: 09/06/2011 Document Reviewed: 12/26/2006 Exit[employer] Patient Information 2014 Exit[employer], [place].   ________________________________________________________________________  WHAT IS A  BLOOD TRANSFUSION? Blood Transfusion Information  A transfusion is the replacement of blood or some of its parts. Blood is made up of multiple cells which provide different functions.  Red blood cells carry oxygen and are used for blood loss replacement.  White blood cells fight against infection.  Platelets control bleeding.  Plasma helps clot blood.  Other blood products are available for specialized needs, such as hemophilia or other clotting disorders. BEFORE THE TRANSFUSION  Who gives blood for transfusions?   Healthy volunteers who are fully evaluated to make sure their blood is safe. This is blood bank blood. Transfusion therapy is the safest it has ever been in the practice of medicine. Before blood is taken from a donor, a complete history is taken to make sure that person has no history of diseases nor engages in risky social behavior (examples are intravenous drug use or sexual activity with multiple partners). The donor's travel history is screened to minimize risk of transmitting infections, such as malaria. The donated blood is tested for signs of infectious diseases, such as HIV and hepatitis. The blood is then tested to be sure it is compatible with you in order to minimize the chance of a transfusion reaction. If you or a relative donates blood, this is often done in anticipation of surgery and is not appropriate for emergency situations. It takes many days to process the donated blood. RISKS AND COMPLICATIONS Although transfusion therapy is very safe and saves many lives, the main dangers of transfusion include:   Getting an infectious disease.  Developing a transfusion reaction. This is an allergic reaction to something in the blood you were given. Every precaution is taken to prevent this. The decision to have a blood transfusion has been considered carefully by your caregiver before blood is given. Blood is not given unless the benefits outweigh the risks. AFTER THE  TRANSFUSION  Right after receiving a blood transfusion, you will usually feel much better and more energetic. This is especially true if your red blood cells have gotten low (anemic). The transfusion raises the level of the red blood cells which carry oxygen, and this usually causes an energy increase.  The nurse administering the transfusion will monitor you carefully for complications. HOME CARE INSTRUCTIONS  No special instructions are needed after a transfusion. You may find your energy is better. Speak with your caregiver about any limitations on activity for underlying diseases you may have. SEEK MEDICAL CARE IF:   Your condition is not improving after your transfusion.  You develop redness or irritation at the intravenous (IV) site. SEEK IMMEDIATE MEDICAL CARE IF:  Any of the following symptoms occur over the next 12 hours:  Shaking chills.  You have a temperature by mouth above 102 F (38.9 C), not controlled by medicine.  Chest, back, or muscle pain.  People around you feel you are not acting correctly or are confused.  Shortness of breath or difficulty breathing.  Dizziness and fainting.  You get a rash or develop hives.  You have a decrease in urine output.  Your urine turns a dark color or changes to pink, red, or brown. Any of the following symptoms occur over the next 10 days:  You have a temperature by mouth above 102 F (38.9 C), not controlled by medicine.  Shortness of breath.  Weakness after normal activity.  The white part of the eye turns yellow (jaundice).  You have a decrease in the amount of urine or are urinating less often.  Your urine turns a dark color or changes to pink, red, or brown. Document Released: 06/11/2000 Document Revised: 09/06/2011 Document Reviewed: 01/29/2008 [facility] Patient Information 2014 [place], [place].  _______________________________________________________________________

## 2013-12-25 NOTE — Progress Notes (Signed)
Your Pt has screened with an elevated risk for obstructive sleep apnea using the Stop-Bang tool during a presurgical  Visit. A score of four or greater is an elevated risk.

## 2013-12-26 ENCOUNTER — Encounter: Payer: Self-pay | Admitting: Family Medicine

## 2013-12-26 ENCOUNTER — Ambulatory Visit (INDEPENDENT_AMBULATORY_CARE_PROVIDER_SITE_OTHER): Payer: Medicare Other | Admitting: Family Medicine

## 2013-12-26 VITALS — BP 120/70 | HR 94

## 2013-12-26 DIAGNOSIS — R0989 Other specified symptoms and signs involving the circulatory and respiratory systems: Secondary | ICD-10-CM

## 2013-12-26 DIAGNOSIS — R0683 Snoring: Secondary | ICD-10-CM

## 2013-12-26 DIAGNOSIS — R0609 Other forms of dyspnea: Secondary | ICD-10-CM

## 2013-12-26 NOTE — Patient Instructions (Signed)
Use Flonase nasal spray regularly for the next several weeks see what that does for your breathing

## 2013-12-26 NOTE — Progress Notes (Signed)
   Subjective:    Patient ID: Alex Edwards, male    DOB: [birth year], 78 y.o.   MRN: [id number]  HPI Is here for consult. He recently had an evaluation prior to hip surgery and there is a question of sleep apnea. He does admit to snoring and especially having difficulty lying on the right side.   Review of Systems     Objective:   Physical Exam Alert and in no distress. Epworth Sleepiness Scale was 2       Assessment & Plan:  Snoring  I seriously doubt that he has sleep apnea. He did recommend that he use Flonase to see if this will help with nasal breathing and cut down on his snoring.

## 2013-12-27 ENCOUNTER — Other Ambulatory Visit: Payer: Self-pay | Admitting: Orthopaedic Surgery

## 2013-12-27 DIAGNOSIS — R918 Other nonspecific abnormal finding of lung field: Secondary | ICD-10-CM

## 2014-01-03 ENCOUNTER — Ambulatory Visit
Admission: RE | Admit: 2014-01-03 | Discharge: 2014-01-03 | Disposition: A | Discharge status: Home / Self Care | Payer: Medicare Other | Source: Ambulatory Visit | Attending: Orthopaedic Surgery | Admitting: Orthopaedic Surgery

## 2014-01-03 DIAGNOSIS — R918 Other nonspecific abnormal finding of lung field: Secondary | ICD-10-CM

## 2014-01-03 MED ORDER — IOHEXOL 300 MG/ML  SOLN
75.0000 mL | Freq: Once | INTRAMUSCULAR | Status: AC | PRN
  Administered 2014-01-03: 75 mL via INTRAVENOUS

## 2014-01-04 ENCOUNTER — Encounter (HOSPITAL_COMMUNITY): Payer: Self-pay | Admitting: *Deleted

## 2014-01-04 ENCOUNTER — Inpatient Hospital Stay (HOSPITAL_COMMUNITY): Payer: Medicare Other

## 2014-01-04 ENCOUNTER — Inpatient Hospital Stay (HOSPITAL_COMMUNITY): Payer: Medicare Other | Admitting: Anesthesiology

## 2014-01-04 ENCOUNTER — Encounter (HOSPITAL_COMMUNITY): Payer: Medicare Other | Admitting: Anesthesiology

## 2014-01-04 ENCOUNTER — Inpatient Hospital Stay (HOSPITAL_COMMUNITY)
Admission: RE | Admit: 2014-01-04 | Discharge: 2014-01-06 | DRG: 470 | Disposition: A | Discharge status: Home Health | Payer: Medicare Other | Source: Ambulatory Visit | Attending: Orthopaedic Surgery | Admitting: Orthopaedic Surgery

## 2014-01-04 ENCOUNTER — Encounter (HOSPITAL_COMMUNITY)
Admission: RE | Disposition: A | Discharge status: Home Health | Payer: Self-pay | Source: Ambulatory Visit | Attending: Orthopaedic Surgery

## 2014-01-04 DIAGNOSIS — E785 Hyperlipidemia, unspecified: Secondary | ICD-10-CM | POA: Diagnosis present

## 2014-01-04 DIAGNOSIS — Z6832 Body mass index (BMI) 32.0-32.9, adult: Secondary | ICD-10-CM | POA: Diagnosis not present

## 2014-01-04 DIAGNOSIS — M169 Osteoarthritis of hip, unspecified: Secondary | ICD-10-CM | POA: Diagnosis present

## 2014-01-04 DIAGNOSIS — Z96649 Presence of unspecified artificial hip joint: Secondary | ICD-10-CM

## 2014-01-04 DIAGNOSIS — M1612 Unilateral primary osteoarthritis, left hip: Secondary | ICD-10-CM

## 2014-01-04 DIAGNOSIS — Z860100 Personal history of colon polyps, unspecified: Secondary | ICD-10-CM

## 2014-01-04 DIAGNOSIS — I10 Essential (primary) hypertension: Secondary | ICD-10-CM | POA: Diagnosis present

## 2014-01-04 DIAGNOSIS — M25559 Pain in unspecified hip: Secondary | ICD-10-CM | POA: Diagnosis present

## 2014-01-04 DIAGNOSIS — M1610 Unilateral primary osteoarthritis, unspecified hip: Secondary | ICD-10-CM | POA: Diagnosis present

## 2014-01-04 HISTORY — PX: TOTAL HIP ARTHROPLASTY: SHX124

## 2014-01-04 LAB — TYPE AND SCREEN
ABO/RH(D): O POS
Antibody Screen: NEGATIVE

## 2014-01-04 LAB — ABO/RH: ABO/RH(D): O POS

## 2014-01-04 SURGERY — ARTHROPLASTY, HIP, TOTAL, ANTERIOR APPROACH
Anesthesia: Spinal | Laterality: Left

## 2014-01-04 MED ORDER — ZOLPIDEM TARTRATE 5 MG PO TABS: 5.0000 mg | ORAL_TABLET | Freq: Every evening | ORAL | Status: DC | PRN

## 2014-01-04 MED ORDER — OXYCODONE HCL 5 MG PO TABS
5.0000 mg | ORAL_TABLET | ORAL | Status: DC | PRN
  Administered 2014-01-04: 5 mg via ORAL
  Administered 2014-01-04 – 2014-01-06 (×5): 10 mg via ORAL
  Filled 2014-01-04 (×2): qty 1
  Filled 2014-01-04 (×2): qty 2
  Filled 2014-01-04: qty 1
  Filled 2014-01-04 (×2): qty 2

## 2014-01-04 MED ORDER — DOCUSATE SODIUM 100 MG PO CAPS
100.0000 mg | ORAL_CAPSULE | Freq: Two times a day (BID) | ORAL | Status: DC
  Administered 2014-01-04 – 2014-01-06 (×4): 100 mg via ORAL

## 2014-01-04 MED ORDER — SODIUM CHLORIDE 0.9 % IR SOLN
Status: DC | PRN
  Administered 2014-01-04 (×2): 1000 mL

## 2014-01-04 MED ORDER — MENTHOL 3 MG MT LOZG: 1.0000 | LOZENGE | OROMUCOSAL | Status: DC | PRN

## 2014-01-04 MED ORDER — AMLODIPINE BESYLATE 5 MG PO TABS
5.0000 mg | ORAL_TABLET | Freq: Every morning | ORAL | Status: DC
  Filled 2014-01-04 (×2): qty 1

## 2014-01-04 MED ORDER — ONDANSETRON HCL 4 MG/2ML IJ SOLN: 4.0000 mg | Freq: Four times a day (QID) | INTRAMUSCULAR | Status: DC | PRN

## 2014-01-04 MED ORDER — ACETAMINOPHEN 325 MG PO TABS
650.0000 mg | ORAL_TABLET | Freq: Four times a day (QID) | ORAL | Status: DC | PRN
  Filled 2014-01-04: qty 2

## 2014-01-04 MED ORDER — PROPOFOL INFUSION 10 MG/ML OPTIME
INTRAVENOUS | Status: DC | PRN
  Administered 2014-01-04: 75 ug/kg/min via INTRAVENOUS

## 2014-01-04 MED ORDER — DIPHENHYDRAMINE HCL 12.5 MG/5ML PO ELIX: 12.5000 mg | ORAL_SOLUTION | ORAL | Status: DC | PRN

## 2014-01-04 MED ORDER — TRANEXAMIC ACID 100 MG/ML IV SOLN
1000.0000 mg | INTRAVENOUS | Status: AC
  Administered 2014-01-04: 1000 mg via INTRAVENOUS
  Filled 2014-01-04: qty 10

## 2014-01-04 MED ORDER — PROPOFOL 10 MG/ML IV BOLUS
INTRAVENOUS | Status: AC
  Filled 2014-01-04: qty 20

## 2014-01-04 MED ORDER — PHENYLEPHRINE 40 MCG/ML (10ML) SYRINGE FOR IV PUSH (FOR BLOOD PRESSURE SUPPORT)
PREFILLED_SYRINGE | INTRAVENOUS | Status: AC
  Filled 2014-01-04: qty 10

## 2014-01-04 MED ORDER — MIDAZOLAM HCL 2 MG/2ML IJ SOLN
INTRAMUSCULAR | Status: AC
  Filled 2014-01-04: qty 2

## 2014-01-04 MED ORDER — MIDAZOLAM HCL 5 MG/5ML IJ SOLN
INTRAMUSCULAR | Status: DC | PRN
  Administered 2014-01-04 (×2): 1 mg via INTRAVENOUS

## 2014-01-04 MED ORDER — DEXAMETHASONE SODIUM PHOSPHATE 10 MG/ML IJ SOLN
INTRAMUSCULAR | Status: DC | PRN
  Administered 2014-01-04: 10 mg via INTRAVENOUS

## 2014-01-04 MED ORDER — ONDANSETRON HCL 4 MG PO TABS: 4.0000 mg | ORAL_TABLET | Freq: Four times a day (QID) | ORAL | Status: DC | PRN

## 2014-01-04 MED ORDER — ONDANSETRON HCL 4 MG/2ML IJ SOLN
INTRAMUSCULAR | Status: DC | PRN
  Administered 2014-01-04: 4 mg via INTRAVENOUS

## 2014-01-04 MED ORDER — 0.9 % SODIUM CHLORIDE (POUR BTL) OPTIME
TOPICAL | Status: DC | PRN
  Administered 2014-01-04: 1000 mL

## 2014-01-04 MED ORDER — LACTATED RINGERS IV SOLN
INTRAVENOUS | Status: DC
  Administered 2014-01-04 (×2): via INTRAVENOUS
  Administered 2014-01-04: 1000 mL via INTRAVENOUS

## 2014-01-04 MED ORDER — CEFAZOLIN SODIUM-DEXTROSE 2-3 GM-% IV SOLR
2.0000 g | INTRAVENOUS | Status: AC
  Administered 2014-01-04: 2 g via INTRAVENOUS

## 2014-01-04 MED ORDER — PHENYLEPHRINE HCL 10 MG/ML IJ SOLN
INTRAMUSCULAR | Status: DC | PRN
  Administered 2014-01-04 (×4): 80 ug via INTRAVENOUS

## 2014-01-04 MED ORDER — METHOCARBAMOL 1000 MG/10ML IJ SOLN
500.0000 mg | Freq: Four times a day (QID) | INTRAVENOUS | Status: DC | PRN
  Administered 2014-01-04: 500 mg via INTRAVENOUS
  Filled 2014-01-04: qty 5

## 2014-01-04 MED ORDER — FENTANYL CITRATE 0.05 MG/ML IJ SOLN
INTRAMUSCULAR | Status: AC
  Filled 2014-01-04: qty 5

## 2014-01-04 MED ORDER — LORATADINE 10 MG PO TABS
10.0000 mg | ORAL_TABLET | Freq: Every day | ORAL | Status: DC
  Administered 2014-01-05 – 2014-01-06 (×2): 10 mg via ORAL
  Filled 2014-01-04 (×2): qty 1

## 2014-01-04 MED ORDER — ASPIRIN EC 325 MG PO TBEC
325.0000 mg | DELAYED_RELEASE_TABLET | Freq: Two times a day (BID) | ORAL | Status: DC
  Administered 2014-01-05 – 2014-01-06 (×3): 325 mg via ORAL
  Filled 2014-01-04 (×5): qty 1

## 2014-01-04 MED ORDER — FENTANYL CITRATE 0.05 MG/ML IJ SOLN
INTRAMUSCULAR | Status: AC
  Filled 2014-01-04: qty 2

## 2014-01-04 MED ORDER — SODIUM CHLORIDE 0.9 % IV SOLN
INTRAVENOUS | Status: DC
  Administered 2014-01-04: 17:00:00 via INTRAVENOUS

## 2014-01-04 MED ORDER — ONDANSETRON HCL 4 MG/2ML IJ SOLN
INTRAMUSCULAR | Status: AC
  Filled 2014-01-04: qty 2

## 2014-01-04 MED ORDER — HYDROMORPHONE HCL PF 1 MG/ML IJ SOLN: 0.2500 mg | INTRAMUSCULAR | Status: DC | PRN

## 2014-01-04 MED ORDER — EPHEDRINE SULFATE 50 MG/ML IJ SOLN
INTRAMUSCULAR | Status: DC | PRN
  Administered 2014-01-04 (×2): 10 mg via INTRAVENOUS

## 2014-01-04 MED ORDER — VITAMIN C 500 MG PO TABS
1000.0000 mg | ORAL_TABLET | Freq: Every day | ORAL | Status: DC
  Administered 2014-01-05 – 2014-01-06 (×2): 1000 mg via ORAL
  Filled 2014-01-04 (×2): qty 2

## 2014-01-04 MED ORDER — FERROUS SULFATE 325 (65 FE) MG PO TABS
325.0000 mg | ORAL_TABLET | Freq: Three times a day (TID) | ORAL | Status: DC
  Administered 2014-01-05 (×2): 325 mg via ORAL
  Filled 2014-01-04 (×4): qty 1

## 2014-01-04 MED ORDER — PHENOL 1.4 % MT LIQD: 1.0000 | OROMUCOSAL | Status: DC | PRN

## 2014-01-04 MED ORDER — METHOCARBAMOL 500 MG PO TABS
500.0000 mg | ORAL_TABLET | Freq: Four times a day (QID) | ORAL | Status: DC | PRN
  Administered 2014-01-04 – 2014-01-05 (×3): 500 mg via ORAL
  Filled 2014-01-04 (×3): qty 1

## 2014-01-04 MED ORDER — DEXAMETHASONE SODIUM PHOSPHATE 10 MG/ML IJ SOLN
INTRAMUSCULAR | Status: AC
  Filled 2014-01-04: qty 1

## 2014-01-04 MED ORDER — METOCLOPRAMIDE HCL 5 MG/ML IJ SOLN: 5.0000 mg | Freq: Three times a day (TID) | INTRAMUSCULAR | Status: DC | PRN

## 2014-01-04 MED ORDER — CEFAZOLIN SODIUM 1-5 GM-% IV SOLN
1.0000 g | Freq: Four times a day (QID) | INTRAVENOUS | Status: AC
  Administered 2014-01-04 – 2014-01-05 (×2): 1 g via INTRAVENOUS
  Filled 2014-01-04 (×2): qty 50

## 2014-01-04 MED ORDER — METOCLOPRAMIDE HCL 10 MG PO TABS: 5.0000 mg | ORAL_TABLET | Freq: Three times a day (TID) | ORAL | Status: DC | PRN

## 2014-01-04 MED ORDER — ALUM & MAG HYDROXIDE-SIMETH 200-200-20 MG/5ML PO SUSP: 30.0000 mL | ORAL | Status: DC | PRN

## 2014-01-04 MED ORDER — PROMETHAZINE HCL 25 MG/ML IJ SOLN: 6.2500 mg | INTRAMUSCULAR | Status: DC | PRN

## 2014-01-04 MED ORDER — LACTATED RINGERS IV SOLN: INTRAVENOUS | Status: DC

## 2014-01-04 MED ORDER — POLYETHYLENE GLYCOL 3350 17 G PO PACK: 17.0000 g | PACK | Freq: Every day | ORAL | Status: DC | PRN

## 2014-01-04 MED ORDER — HYDROMORPHONE HCL PF 1 MG/ML IJ SOLN
1.0000 mg | INTRAMUSCULAR | Status: DC | PRN
  Administered 2014-01-04: 1 mg via INTRAVENOUS
  Filled 2014-01-04: qty 1

## 2014-01-04 MED ORDER — FENTANYL CITRATE 0.05 MG/ML IJ SOLN
INTRAMUSCULAR | Status: DC | PRN
  Administered 2014-01-04 (×2): 50 ug via INTRAVENOUS

## 2014-01-04 MED ORDER — ACETAMINOPHEN 650 MG RE SUPP: 650.0000 mg | Freq: Four times a day (QID) | RECTAL | Status: DC | PRN

## 2014-01-04 SURGICAL SUPPLY — 40 items
BAG ZIPLOCK 12X15 (MISCELLANEOUS) IMPLANT
BENZOIN TINCTURE PRP APPL 2/3 (GAUZE/BANDAGES/DRESSINGS) ×3 IMPLANT
BLADE SAW SGTL 18X1.27X75 (BLADE) ×2 IMPLANT
BLADE SAW SGTL 18X1.27X75MM (BLADE) ×1
CAPT HIP PF COP ×3 IMPLANT
CELLS DAT CNTRL 66122 CELL SVR (MISCELLANEOUS) ×1 IMPLANT
CLOSURE WOUND 1/2 X4 (GAUZE/BANDAGES/DRESSINGS) ×1
COVER PERINEAL POST (MISCELLANEOUS) ×3 IMPLANT
DRAPE C-ARM 42X120 X-RAY (DRAPES) ×3 IMPLANT
DRAPE STERI IOBAN 125X83 (DRAPES) ×3 IMPLANT
DRAPE U-SHAPE 47X51 STRL (DRAPES) ×9 IMPLANT
DRSG AQUACEL AG ADV 3.5X10 (GAUZE/BANDAGES/DRESSINGS) ×3 IMPLANT
DURAPREP 26ML APPLICATOR (WOUND CARE) ×3 IMPLANT
ELECT BLADE TIP CTD 4 INCH (ELECTRODE) ×3 IMPLANT
ELECT REM PT RETURN 9FT ADLT (ELECTROSURGICAL) ×3
ELECTRODE REM PT RTRN 9FT ADLT (ELECTROSURGICAL) ×1 IMPLANT
FACESHIELD WRAPAROUND (MASK) ×12 IMPLANT
GAUZE XEROFORM 1X8 LF (GAUZE/BANDAGES/DRESSINGS) IMPLANT
GLOVE BIO SURGEON STRL SZ7.5 (GLOVE) ×3 IMPLANT
GLOVE BIOGEL PI IND STRL 8 (GLOVE) ×2 IMPLANT
GLOVE BIOGEL PI INDICATOR 8 (GLOVE) ×4
GLOVE ECLIPSE 8.0 STRL XLNG CF (GLOVE) ×3 IMPLANT
GOWN STRL REUS W/TWL XL LVL3 (GOWN DISPOSABLE) ×6 IMPLANT
HANDPIECE INTERPULSE COAX TIP (DISPOSABLE) ×2
KIT BASIN OR (CUSTOM PROCEDURE TRAY) ×3 IMPLANT
PACK TOTAL JOINT (CUSTOM PROCEDURE TRAY) ×3 IMPLANT
RTRCTR WOUND ALEXIS 18CM MED (MISCELLANEOUS) ×3
SET HNDPC FAN SPRY TIP SCT (DISPOSABLE) ×1 IMPLANT
STAPLER VISISTAT 35W (STAPLE) IMPLANT
STRIP CLOSURE SKIN 1/2X4 (GAUZE/BANDAGES/DRESSINGS) ×2 IMPLANT
SUT ETHIBOND NAB CT1 #1 30IN (SUTURE) ×3 IMPLANT
SUT ETHILON 3 0 PS 1 (SUTURE) ×3 IMPLANT
SUT MNCRL AB 4-0 PS2 18 (SUTURE) ×3 IMPLANT
SUT VIC AB 0 CT1 36 (SUTURE) ×3 IMPLANT
SUT VIC AB 1 CT1 36 (SUTURE) ×3 IMPLANT
SUT VIC AB 2-0 CT1 27 (SUTURE) ×4
SUT VIC AB 2-0 CT1 TAPERPNT 27 (SUTURE) ×2 IMPLANT
TOWEL OR 17X26 10 PK STRL BLUE (TOWEL DISPOSABLE) ×3 IMPLANT
TOWEL OR NON WOVEN STRL DISP B (DISPOSABLE) ×3 IMPLANT
TRAY FOLEY CATH 14FRSI W/METER (CATHETERS) IMPLANT

## 2014-01-04 NOTE — Anesthesia Postprocedure Evaluation (Signed)
Anesthesia Post Note  Patient: Alex Edwards  Procedure(s) Performed: Procedure(s) (LRB): LEFT TOTAL HIP ARTHROPLASTY ANTERIOR APPROACH (Left)  Anesthesia type: Spinal  Patient location: PACU  Post pain: Pain level controlled  Post assessment: Post-op Vital signs reviewed  Last Vitals:  Filed Vitals:   01/04/14 1728  BP: 161/83  Pulse: 87  Temp: 36.6 C  Resp: 20    Post vital signs: Reviewed  Level of consciousness: sedated  Complications: No apparent anesthesia complications

## 2014-01-04 NOTE — Anesthesia Preprocedure Evaluation (Signed)
Anesthesia Evaluation  Patient identified by MRN, date of birth, ID band Patient awake    Reviewed: Allergy & Precautions, H&P , NPO status , Patient's Chart, lab work & pertinent test results  Airway Mallampati: III TM Distance: >3 FB Neck ROM: Full    Dental  (+) Edentulous Upper, Edentulous Lower   Pulmonary Current Smoker, former smoker,  breath sounds clear to auscultation  Pulmonary exam normal       Cardiovascular hypertension, Pt. on medications Rhythm:Regular Rate:Normal     Neuro/Psych negative neurological ROS  negative psych ROS   GI/Hepatic negative GI ROS, Neg liver ROS,   Endo/Other  Morbid obesity  Renal/GU negative Renal ROS  negative genitourinary   Musculoskeletal negative musculoskeletal ROS (+)   Abdominal   Peds negative pediatric ROS (+)  Hematology negative hematology ROS (+)   Anesthesia Other Findings   Reproductive/Obstetrics                           Anesthesia Physical Anesthesia Plan  ASA: III  Anesthesia Plan: Spinal   Post-op Pain Management:    Induction: Intravenous  Airway Management Planned: Simple Face Mask  Additional Equipment:   Intra-op Plan:   Post-operative Plan:   Informed Consent: I have reviewed the patients History and Physical, chart, labs and discussed the procedure including the risks, benefits and alternatives for the proposed anesthesia with the patient or authorized representative who has indicated his/her understanding and acceptance.   Dental advisory given  Plan Discussed with: CRNA  Anesthesia Plan Comments:         Anesthesia Quick Evaluation

## 2014-01-04 NOTE — Brief Op Note (Signed)
01/04/2014  2:47 PM  PATIENT:  Marcelino Duster  78 y.o. male  PRE-OPERATIVE DIAGNOSIS:  Severe osteoarthritis left hip  POST-OPERATIVE DIAGNOSIS:  Severe osteoarthritis left hip  PROCEDURE:  Procedure(s): LEFT TOTAL HIP ARTHROPLASTY ANTERIOR APPROACH (Left)  SURGEON:  Surgeon(s) and Role:    * Mcarthur Rossetti, MD - Primary  PHYSICIAN ASSISTANT: Benita Stabile, PA-C  ANESTHESIA:   spinal  EBL:  Total I/O In: 2000 [I.V.:2000] Out: 400 [Urine:100; Blood:300]  BLOOD ADMINISTERED:none  DRAINS: none   LOCAL MEDICATIONS USED:  NONE  SPECIMEN:  No Specimen  DISPOSITION OF SPECIMEN:  N/A  COUNTS:  YES  TOURNIQUET:  * No tourniquets in log *  DICTATION: .Other Dictation: Dictation Number [id number]  PLAN OF CARE: Admit to inpatient   PATIENT DISPOSITION:  PACU - hemodynamically stable.   Delay start of Pharmacological VTE agent (>24hrs) due to surgical blood loss or risk of bleeding: no

## 2014-01-04 NOTE — H&P (Signed)
TOTAL HIP ADMISSION H&P  Patient is admitted for left total hip arthroplasty.  Subjective:  Chief Complaint: left hip pain  HPI: Alex Edwards, 78 y.o. male, has a history of pain and functional disability in the left hip(s) due to arthritis and patient has failed non-surgical conservative treatments for greater than 12 weeks to include NSAID's and/or analgesics, corticosteriod injections, use of assistive devices, weight reduction as appropriate and activity modification.  Onset of symptoms was gradual starting 4 years ago with gradually worsening course since that time.The patient noted no past surgery on the left hip(s).  Patient currently rates pain in the left hip at 10 out of 10 with activity. Patient has night pain, worsening of pain with activity and weight bearing, trendelenberg gait, pain that interfers with activities of daily living, pain with passive range of motion and crepitus. Patient has evidence of subchondral cysts, subchondral sclerosis, periarticular osteophytes and joint space narrowing by imaging studies. This condition presents safety issues increasing the risk of falls.  There is no current active infection.  Patient Active Problem List   Diagnosis Date Noted  . Arthritis of left hip 01/04/2014  . Hypertension 10/14/2011  . Hyperlipidemia LDL goal < 100 10/14/2011  . Psoriasis 10/14/2011  . Obesity (BMI 30-39.9) 10/14/2011  . Allergic rhinitis due to pollen 10/14/2011  . Former smoker 10/14/2011   Past Medical History  Diagnosis Date  . BCE (basal cell epithelioma)   . Dyslipidemia   . Hx of adenomatous colonic polyps   . Psoriasis   . ED (erectile dysfunction)   . Allergic rhinitis   . Smoker   . Obesity   . Hypertension   . Cancer     SQ cell  . Cough     smokers cough or perfurmes    Past Surgical History  Procedure Laterality Date  . Colonoscopy  2005    [facility]  . Polypectomy    . Cataract extraction, bilateral Bilateral     No prescriptions  prior to admission   No Known Allergies  History  Substance Use Topics  . Smoking status: Former Smoker    Quit date: 04/15/2013  . Smokeless tobacco: Never Used     Comment: started back, now trying to quit again 12-25-13  . Alcohol Use: Yes     Comment: rare    Family History  Problem Relation Age of Onset  . Heart disease Brother      Review of Systems  Musculoskeletal: Positive for joint pain.  All other systems reviewed and are negative.   Objective:  Physical Exam  Constitutional: He is oriented to person, place, and time. He appears well-developed and well-nourished.  HENT:  Head: Normocephalic and atraumatic.  Eyes: EOM are normal. Pupils are equal, round, and reactive to light.  Neck: Normal range of motion. Neck supple.  Cardiovascular: Normal rate and regular rhythm.   Respiratory: Effort normal and breath sounds normal.  GI: Soft. Bowel sounds are normal.  Musculoskeletal:       Left hip: He exhibits decreased range of motion, decreased strength, tenderness, bony tenderness and crepitus.  Neurological: He is alert and oriented to person, place, and time.  Skin: Skin is warm and dry.  Psychiatric: He has a normal mood and affect.    Vital signs in last 24 hours:    Labs:   Estimated body mass index is 32.46 kg/(m^2) as calculated from the following:   Height as of 11/24/11: 6\' 1"  (1.854 m).   Weight  as of 04/17/13: 111.585 kg (246 lb).   Imaging Review Plain radiographs demonstrate severe degenerative joint disease of the left hip(s). The bone quality appears to be good for age and reported activity level.  Assessment/Plan:  End stage arthritis, left hip(s)  The patient history, physical examination, clinical judgement of the provider and imaging studies are consistent with end stage degenerative joint disease of the left hip(s) and total hip arthroplasty is deemed medically necessary. The treatment options including medical management, injection  therapy, arthroscopy and arthroplasty were discussed at length. The risks and benefits of total hip arthroplasty were presented and reviewed. The risks due to aseptic loosening, infection, stiffness, dislocation/subluxation,  thromboembolic complications and other imponderables were discussed.  The patient acknowledged the explanation, agreed to proceed with the plan and consent was signed. Patient is being admitted for inpatient treatment for surgery, pain control, PT, OT, prophylactic antibiotics, VTE prophylaxis, progressive ambulation and ADL's and discharge planning.The patient is planning to be discharged home with home health services

## 2014-01-04 NOTE — Transfer of Care (Signed)
Immediate Anesthesia Transfer of Care Note  Patient: Alex Edwards  Procedure(s) Performed: Procedure(s) (LRB): LEFT TOTAL HIP ARTHROPLASTY ANTERIOR APPROACH (Left)  Patient Location: PACU  Anesthesia Type: Spinal  Level of Consciousness: sedated, patient cooperative and responds to stimulation  Airway & Oxygen Therapy: Patient Spontanous Breathing and Patient connected to face mask oxgen  Post-op Assessment: Report given to PACU RN and Post -op Vital signs reviewed and stable  Post vital signs: Reviewed and stable  Complications: No apparent anesthesia complications

## 2014-01-04 NOTE — H&P (Deleted)
Alex Edwards is an 78 y.o. male.   Chief Complaint:   Right elbow/arm mass HPI:   78 yo male with slow growing mass on his right elbow area just medial.  He states that it has been there at least 12 years, but has gotten bigger.  It does not hurt him and there is no evidence of infection of other malignant process.  He wishes now to have it removed due to its size.  He understands fully the risks of surgery given his age and cardia issues in the past.  Past Medical History  Diagnosis Date  . BCE (basal cell epithelioma)   . Dyslipidemia   . Hx of adenomatous colonic polyps   . Psoriasis   . ED (erectile dysfunction)   . Allergic rhinitis   . Smoker   . Obesity   . Hypertension   . Cancer     SQ cell  . Cough     smokers cough or perfurmes    Past Surgical History  Procedure Laterality Date  . Colonoscopy  2005    Gessner  . Polypectomy    . Cataract extraction, bilateral Bilateral     Family History  Problem Relation Age of Onset  . Heart disease Brother    Social History:  reports that he quit smoking about 8 months ago. He has never used smokeless tobacco. He reports that he drinks alcohol. He reports that he does not use illicit drugs.  Allergies: No Known Allergies  No prescriptions prior to admission    No results found for this or any previous visit (from the past 48 hour(s)). Ct Chest W Contrast  01/03/2014   CLINICAL DATA:  Followup 5 mm nodule.  EXAM: CT CHEST WITH CONTRAST  TECHNIQUE: Multidetector CT imaging of the chest was performed during intravenous contrast administration.  CONTRAST:  4mL OMNIPAQUE IOHEXOL 300 MG/ML  SOLN  COMPARISON:  Chest x-ray 12/25/2013  FINDINGS: Thoracic aorta is atherosclerotic. No aneurysm or dissection. Pulmonary arteries are normal. Heart size normal. Coronary artery disease.  Shotty mediastinal lymph nodes are present. Thoracic esophagus unremarkable.  Large airways are patent. Calcified pulmonary nodules noted right mid lung  field. One of the calcified nodules corresponds to the lesion identified on recent chest x-ray . These findings are consistent with calcified granulomas. Tiny calcified nodule consistent with granuloma right lung base. Biapical pleural thickening noted consistent with scarring.  1.8 cm nodular density in the left adrenal. Statistically this is most likely an adrenal adenoma. MRI can be obtained for confirmation.  Thyroid is unremarkable. Small axillary lymph nodes are present. Degenerative changes thoracic spine. Old left rib fractures present.  IMPRESSION: 1. Previously identified nodule right midlung field represents calcified nodule. Multiple calcified nodules are present and consistent with granulomas. No further evaluation of the pulmonary nodules needed. 2. A 1.8 cm nodular density left adrenal, statistically this is most likely an adrenal adenoma. Confirmation with adrenal MRI can be obtained.   Electronically Signed   By: Marcello Moores  Register   On: 01/03/2014 13:44    Review of Systems  All other systems reviewed and are negative.   There were no vitals taken for this visit. Physical Exam  Constitutional: He is oriented to person, place, and time. He appears well-developed and well-nourished.  HENT:  Head: Normocephalic and atraumatic.  Eyes: EOM are normal. Pupils are equal, round, and reactive to light.  Neck: Normal range of motion. Neck supple.  Cardiovascular: Normal rate.   Respiratory: Effort normal  and breath sounds normal.  GI: Soft. Bowel sounds are normal.  Musculoskeletal:       Arms: Neurological: He is alert and oriented to person, place, and time.  Skin: Skin is warm and dry.  Psychiatric: He has a normal mood and affect.    The right elbow mass is about the size of a golf ball.   Assessment/Plan Right elbow/arm with slow growing soft-tissue mass 1)  To the OR today for exploration and excision of his right arm/elbow mass.  Will send the specimen for ID by  Pathology.  Kensli Bowley Y 01/04/2014, 7:29 AM

## 2014-01-05 LAB — BASIC METABOLIC PANEL
Anion gap: 12 (ref 5–15)
BUN: 17 mg/dL (ref 6–23)
CHLORIDE: 99 meq/L (ref 96–112)
CO2: 26 mEq/L (ref 19–32)
Calcium: 8.8 mg/dL (ref 8.4–10.5)
Creatinine, Ser: 0.87 mg/dL (ref 0.50–1.35)
GFR calc non Af Amer: 82 mL/min — ABNORMAL LOW (ref >90)
Glucose, Bld: 172 mg/dL — ABNORMAL HIGH (ref 70–99)
POTASSIUM: 4.8 meq/L (ref 3.7–5.3)
Sodium: 137 mEq/L (ref 137–147)

## 2014-01-05 LAB — CBC
HEMATOCRIT: 38.5 % — AB (ref 39.0–52.0)
HEMOGLOBIN: 13.2 g/dL (ref 13.0–17.0)
MCH: 31.7 pg (ref 26.0–34.0)
MCHC: 34.3 g/dL (ref 30.0–36.0)
MCV: 92.5 fL (ref 78.0–100.0)
Platelets: 233 10*3/uL (ref 150–400)
RBC: 4.16 MIL/uL — AB (ref 4.22–5.81)
RDW: 12.4 % (ref 11.5–15.5)
WBC: 15.1 10*3/uL — AB (ref 4.0–10.5)

## 2014-01-05 MED ORDER — OXYCODONE-ACETAMINOPHEN 5-325 MG PO TABS: 1.0000 | ORAL_TABLET | ORAL | Status: DC | PRN

## 2014-01-05 MED ORDER — ASPIRIN 325 MG PO TBEC: 325.0000 mg | DELAYED_RELEASE_TABLET | Freq: Two times a day (BID) | ORAL | Status: DC

## 2014-01-05 NOTE — Op Note (Signed)
NAMEDOYT, CASTELLANA NO.:  [id number]  MEDICAL RECORD NO.:  [id number]  LOCATION:  25                         FACILITY:  [facility]  PHYSICIAN:  Lind Guest. Ninfa Linden, M.D.DATE OF BIRTH:  14-Dec-1935  DATE OF PROCEDURE:  01/04/2014 DATE OF DISCHARGE:                              OPERATIVE REPORT   PREOPERATIVE DIAGNOSIS:  Severe end-stage arthritis and degenerative joint disease, left hip.  POSTOPERATIVE DIAGNOSIS:  Severe end-stage arthritis and degenerative joint disease, left hip.  PROCEDURE:  Left total hip arthroplasty through direct anterior approach.  IMPLANTS:  DePuy Sector Gription acetabular component size 58, 36 +4 neutral polyethylene liner, size 15 Corail femoral component with standard offset, size 36+ 8.5 hip ball.  SURGEON:  Lind Guest. Ninfa Linden, MD  ASSISTANTS: 1. Erskine [name], PA-C 2. Shary Decamp, physician assistant student II.  ANESTHESIA:  Spinal.  ANTIBIOTICS:  A 2 g IV Ancef.  BLOOD LOSS:  250 mL.  COMPLICATIONS:  None.  INDICATIONS:  Alex Edwards is a 78 year old gentleman well known to me.  He had at least a 4-5 year history of worsening left hip pain.  His x-rays had showed complete loss of his left joint space with periarticular osteophytes and sclerotic change as well.  His pain is daily and his affecting his mobility and quality of life.  At this point, he wished to proceed with a total hip arthroplasty given failure of conservative treatment.  He understands the risks of acute blood loss anemia, nerve and vessel injury, infection, fracture, dislocation and DVT.  He understands the goals are decreased pain, improved mobility, and overall improved quality of life.  PROCEDURE DESCRIPTION:  After informed consent was obtained, appropriate left hip was marked, he was brought to the operating room and spinal anesthesia was obtained while he was on a stretcher.  A Foley catheter was placed and then both feet had traction  boots applied.  He was placed supine on the Hana fracture table with perineal post in place and both legs in inline skeletal traction devices but no traction applied.  His left operative hip was then prepped and draped with DuraPrep and sterile drapes.  A time-out was called to identify the correct patient and correct left hip.  I then made an incision just inferior and posterior to the anterior-superior iliac spine and carried this obliquely down the leg.  I dissected down to the tensor fascia lata muscle and tensor fascia was then divided longitudinally so I could proceed with a direct anterior approach to the hip.  I placed a Cobra retractor around the lateral neck and up underneath the rectus femoris, placed the Cobra retractor medially.  I cauterized the lateral femoral circumflex vessels.  I then opened up the hip capsule in an L-type format finding a large joint effusion.  I placed my Cobra retractor within the joint capsule.  I then made my femoral neck cut proximal to the lesser trochanter using an oscillating saw and completed this with an osteotome.  I placed a corkscrew guide the femoral head and removed the femoral head in its entirety and found to be significantly devoid of cartilage.  It was very large as well.  I then cleaned the remnants of acetabulum, labrum and debris.  I placed a bent Hohmann medially and a Cobra retractor laterally.  I then began reaming from a size 42 reamer in 2 mm increments under direct visualization all the way up to a size 58 with fall reamers under direct visualization, the last reamer also under direct fluoroscopy so we could obtain our depth of inclination and anteversion.  Once this was done, I placed a real DePuy Sector Gription acetabular component size 58 and apex hole eliminator guide and a 36+ 4 neutral polyethylene liner for size 58 cup.  Attention was then turned to the femur.  With the leg externally rotated to 100 degrees,  extended and adducted, I then placed Mueller retractor medially and a Cobra retractor behind the greater trochanter.  I used a box cutting osteotome in the femoral canal.  I also released the lateral joint capsule.  I used a rongeur to lateralize the medium broaching from a size 8 broach using Corail broaching system all the way up to a size 15.  With the size 15 broach in place, I placed a standard neck and a 36+ 1.5 hip ball.  We reduced it in the acetabulum and it was decently stable.  We felt like it was significantly short so I dislocated the hip and trialed all the way up to a size 8.5 hip ball.  I was definitely pleased with this once we reduced in acetabulum in terms of offset, leg length and stability.  We then dislocated the hip and removed the trial components. We placed the real Corail femoral component size 15 with standard offset.  We also placed the real 36+ 8.5 ceramic hip ball.  We reduced this back and the acetabulum was completely stable.  We copiously irrigated the soft tissues with normal saline solution using pulsatile lavage.  We closed the joint capsule with interrupted #1 Ethibond suture followed by a running #1 Vicryl in the tensor fascia, 0 Vicryl in the deep tissue, 2-0 Vicryl in subcutaneous tissue, 4-0 Monocryl subcuticular stitch and Steri-Strips on the skin.  An Aquacel dressing was applied.  He was taken off the Hana table to the recovery room in stable condition.  Of note, Alex Stabile, PA-C assisted in the entire and his assistance was crucial for facilitating this case throughout all aspects.     Lind Guest. Ninfa Linden, M.D.     CYB/MEDQ  D:  01/04/2014  T:  01/05/2014  Job:  [id number]

## 2014-01-05 NOTE — Plan of Care (Signed)
Problem: Phase III Progression Outcomes Goal: Anticoagulant follow-up in place Outcome: Not Applicable Date Met:  01/05/14 asa     

## 2014-01-05 NOTE — Discharge Summary (Signed)
Patient ID: Alex Edwards MRN: [id number] DOB/AGE: [birth year] 76 y.o.  Admit date: 01/04/2014 Discharge date: 01/05/2014  Admission Diagnoses:  Principal Problem:   Arthritis of left hip Active Problems:   Status post THR (total hip replacement)   Discharge Diagnoses:  Same  Past Medical History  Diagnosis Date  . BCE (basal cell epithelioma)   . Dyslipidemia   . Hx of adenomatous colonic polyps   . Psoriasis   . ED (erectile dysfunction)   . Allergic rhinitis   . Smoker   . Obesity   . Hypertension   . Cancer     SQ cell  . Cough     smokers cough or perfurmes    Surgeries: Procedure(s): LEFT TOTAL HIP ARTHROPLASTY ANTERIOR APPROACH on 01/04/2014   Consultants:    Discharged Condition: Improved  Hospital Course: Alex Edwards is an 78 y.o. male who was admitted 01/04/2014 for operative treatment ofArthritis of left hip. Patient has severe unremitting pain that affects sleep, daily activities, and work/hobbies. After pre-op clearance the patient was taken to the operating room on 01/04/2014 and underwent  Procedure(s): LEFT TOTAL HIP ARTHROPLASTY ANTERIOR APPROACH.    Patient was given perioperative antibiotics: Anti-infectives   Start     Dose/Rate Route Frequency Ordered Stop   01/04/14 2000  ceFAZolin (ANCEF) IVPB 1 g/50 mL premix     1 g 100 mL/hr over 30 Minutes Intravenous Every 6 hours 01/04/14 1629 01/05/14 0236   01/04/14 0934  ceFAZolin (ANCEF) IVPB 2 g/50 mL premix     2 g 100 mL/hr over 30 Minutes Intravenous On call to O.R. 01/04/14 0934 01/04/14 1315       Patient was given sequential compression devices, early ambulation, and chemoprophylaxis to prevent DVT.  Patient benefited maximally from hospital stay and there were no complications.    Recent vital signs: Patient Vitals for the past 24 hrs:  BP Temp Temp src Pulse Resp SpO2 Height Weight  01/05/14 0900 150/79 mmHg 97.8 F (36.6 C) Oral 100 18 98 % - -  01/05/14 0500 102/67 mmHg 98.7 F (37.1  C) Oral 86 18 96 % - -  01/05/14 0210 119/72 mmHg 97.6 F (36.4 C) Oral 89 18 95 % - -  01/04/14 2205 131/72 mmHg 97.5 F (36.4 C) Oral 95 18 95 % - -  01/04/14 1938 143/76 mmHg 97.8 F (36.6 C) Oral 88 19 97 % - -  01/04/14 1830 160/86 mmHg 98.1 F (36.7 C) Oral 92 18 97 % - -  01/04/14 1728 161/83 mmHg 97.9 F (36.6 C) Axillary 87 20 99 % - -  01/04/14 1621 130/74 mmHg 98.1 F (36.7 C) Oral 78 - 95 % 6\' 1"  (1.854 m) 114.873 kg (253 lb 4 oz)  01/04/14 1600 126/64 mmHg - - 75 22 98 % - -  01/04/14 1545 123/65 mmHg 97.9 F (36.6 C) - 75 22 98 % - -  01/04/14 1530 115/55 mmHg - - 74 18 99 % - -  01/04/14 1515 116/61 mmHg - - 75 21 97 % - -  01/04/14 1510 - - - 74 20 99 % - -  01/04/14 1503 - 97.5 F (36.4 C) - 75 17 100 % - -  01/04/14 1500 128/57 mmHg - - - - - - -     Recent laboratory studies:  Recent Labs  01/05/14 0416  WBC 15.1*  HGB 13.2  HCT 38.5*  PLT 233  NA 137  K 4.8  CL  99  CO2 26  BUN 17  CREATININE 0.87  GLUCOSE 172*  CALCIUM 8.8     Discharge Medications:     Medication List    STOP taking these medications       naproxen sodium 220 MG tablet  Commonly known as:  ANAPROX      TAKE these medications       amLODipine 5 MG tablet  Commonly known as:  NORVASC  Take 5 mg by mouth every morning.     aspirin 325 MG EC tablet  Take 1 tablet (325 mg total) by mouth 2 (two) times daily after a meal.     clobetasol cream 0.05 %  Commonly known as:  TEMOVATE  Apply 1 application topically daily. For psoriasis on hands     [employer] CRANBERRY 84-20 MG Caps  Generic drug:  Cranberry-Vitamin C  Take 1 tablet by mouth daily.     loratadine 10 MG tablet  Commonly known as:  CLARITIN  Take 10 mg by mouth daily.     LUCENTIS IO  Place into the right eye as directed. Every 5 to 6 weeks injections     oxyCODONE-acetaminophen 5-325 MG per tablet  Commonly known as:  ROXICET  Take 1-2 tablets by mouth every 4 (four) hours as needed.     vitamin C  1000 MG tablet  Take 1,000 mg by mouth daily.        Diagnostic Studies: Dg Chest 2 View  12/25/2013   CLINICAL DATA:  Preop total left hip arthroplasty  EXAM: CHEST  2 VIEW  COMPARISON:  None.  FINDINGS: The heart size and vascular pattern are normal. There is no infiltrate or consolidation. Old left posterior lateral rib deformities suggest prior fracture. 5 mm nodular opacity right mid lung zone. Left lung is clear.  IMPRESSION: Consider CT thorax for 5 mm nodule right midlung.   Electronically Signed   By: Skipper Cliche M.D.   On: 12/25/2013 15:24   Dg Hip Complete Left  01/04/2014   CLINICAL DATA:  Total hip arthroplasty  EXAM: DG C-ARM 1-60 MIN - NRPT [facility]; LEFT HIP - COMPLETE 2+ VIEW  COMPARISON:  March 28, 2012  FINDINGS: Frontal left hip image and frontal pubic symphysis image obtained. There is a total hip prosthesis on the left which appears well seated. No fracture or dislocation. No lesions are identified in either pubic ramus area.  IMPRESSION: Left total hip prosthesis appears well seated. No acute fracture or dislocation.   Electronically Signed   By: Lowella Grip M.D.   On: 01/04/2014 14:43   Ct Chest W Contrast  01/03/2014   CLINICAL DATA:  Followup 5 mm nodule.  EXAM: CT CHEST WITH CONTRAST  TECHNIQUE: Multidetector CT imaging of the chest was performed during intravenous contrast administration.  CONTRAST:  82mL OMNIPAQUE IOHEXOL 300 MG/ML  SOLN  COMPARISON:  Chest x-ray 12/25/2013  FINDINGS: Thoracic aorta is atherosclerotic. No aneurysm or dissection. Pulmonary arteries are normal. Heart size normal. Coronary artery disease.  Shotty mediastinal lymph nodes are present. Thoracic esophagus unremarkable.  Large airways are patent. Calcified pulmonary nodules noted right mid lung field. One of the calcified nodules corresponds to the lesion identified on recent chest x-ray . These findings are consistent with calcified granulomas. Tiny calcified nodule consistent with granuloma  right lung base. Biapical pleural thickening noted consistent with scarring.  1.8 cm nodular density in the left adrenal. Statistically this is most likely an adrenal adenoma. MRI can be obtained  for confirmation.  Thyroid is unremarkable. Small axillary lymph nodes are present. Degenerative changes thoracic spine. Old left rib fractures present.  IMPRESSION: 1. Previously identified nodule right midlung field represents calcified nodule. Multiple calcified nodules are present and consistent with granulomas. No further evaluation of the pulmonary nodules needed. 2. A 1.8 cm nodular density left adrenal, statistically this is most likely an adrenal adenoma. Confirmation with adrenal MRI can be obtained.   Electronically Signed   By: Marcello Moores  Register   On: 01/03/2014 13:44   Dg Pelvis Portable  01/04/2014   CLINICAL DATA:  Postop left hip replacement.  EXAM: PORTABLE PELVIS 1-2 VIEWS  COMPARISON:  None.  FINDINGS: Patient is status post left total hip arthroplasty. The prosthesis is located. The acetabular and femoral components are well seated. There is gas in the joint space, as expected. The visualized pelvis is otherwise unremarkable.  IMPRESSION: Left hip total arthroplasty without radiographic evidence for complication.   Electronically Signed   By: Lawrence Santiago M.D.   On: 01/04/2014 15:36   Dg Hip Portable 1 View Left  01/04/2014   CLINICAL DATA:  Status post left hip arthroplasty  EXAM: PORTABLE LEFT HIP - 1 VIEW  COMPARISON:  Fluoro spot films of today's date  FINDINGS: A single cross-table lateral view of the left hip prosthesis reveals normal positioning of the prosthetic components. The interface with the native bone is normal.  IMPRESSION: The patient is status post left hip arthroplasty without evidence of immediate postprocedure complication.   Electronically Signed   By: David  Martinique   On: 01/04/2014 15:38   Dg C-arm 1-60 Min-no Report  01/04/2014   CLINICAL DATA:  Total hip arthroplasty   EXAM: DG C-ARM 1-60 MIN - NRPT [facility]; LEFT HIP - COMPLETE 2+ VIEW  COMPARISON:  March 28, 2012  FINDINGS: Frontal left hip image and frontal pubic symphysis image obtained. There is a total hip prosthesis on the left which appears well seated. No fracture or dislocation. No lesions are identified in either pubic ramus area.  IMPRESSION: Left total hip prosthesis appears well seated. No acute fracture or dislocation.   Electronically Signed   By: Lowella Grip M.D.   On: 01/04/2014 14:43    Disposition: to home      Discharge Instructions   Call MD / Call 911    Complete by:  As directed   If you experience chest pain or shortness of breath, CALL 911 and be transported to the hospital emergency room.  If you develope a fever above 101 F, pus (white drainage) or increased drainage or redness at the wound, or calf pain, call your surgeon's office.     Constipation Prevention    Complete by:  As directed   Drink plenty of fluids.  Prune juice may be helpful.  You may use a stool softener, such as Colace (over the counter) 100 mg twice a day.  Use MiraLax (over the counter) for constipation as needed.     Diet - low sodium heart healthy    Complete by:  As directed      Discharge instructions    Complete by:  As directed   Increase your activities as comfort allows. Expect left thigh and knee pain and swelling. Ice as needed. You can get your current dressing wet daily in the shower; leave your current dressing on for the next 6 days. In 6 days, you can remove your current dressing and start getting your actual incision and steristreips  wet daily; then place a new dry dressing after each shower. Take an over-the-counter stool softener daily     Increase activity slowly as tolerated    Complete by:  As directed            Follow-up Information   Follow up with Mcarthur Rossetti, MD In 2 weeks.   Specialty:  Orthopedic Surgery   Contact information:   [place] [address] [phone]        Signed: Mcarthur Rossetti 01/05/2014, 1:15 PM

## 2014-01-05 NOTE — Progress Notes (Signed)
OT Cancellation Note  Patient Details Name: Alex Edwards MRN: [id number] DOB: 1936/04/24   Cancelled Treatment:    Reason Eval/Treat Not Completed: OT screened, no needs identified, will sign off. Pt and pt's wife feel comfortable with ADLs postop.  Latonya Nelon A 01/05/2014, 11:07 AM

## 2014-01-05 NOTE — Progress Notes (Signed)
Patient ID: Alex Edwards, male   DOB: 1935-12-07, 78 y.o.   MRN: [id number] Postoperative day 1 status post left total hip arthroplasty. Patient is progressing well with therapy anticipate discharge to home in a day or 2.

## 2014-01-05 NOTE — Progress Notes (Signed)
Physical Therapy Treatment Patient Details Name: Alex Edwards MRN: [id number] DOB: Sep 14, 1935 Today's Date: February 02, 2014    History of Present Illness L THR    PT Comments    Progressing well and highly motivated to d/c in am  Follow Up Recommendations  Home health PT     Equipment Recommendations  None recommended by PT    Recommendations for Other Services OT consult     Precautions / Restrictions Precautions Precautions: Fall Restrictions Weight Bearing Restrictions: No Other Position/Activity Restrictions: WBAT    Mobility  Bed Mobility                  Transfers Overall transfer level: Needs assistance Equipment used: Rolling walker (2 wheeled) Transfers: Sit to/from Stand Sit to Stand: Min guard         General transfer comment: cues for LE management and use of UEs to self assist  Ambulation/Gait Ambulation/Gait assistance: Min guard Ambulation Distance (Feet): 222 Feet Assistive device: Rolling walker (2 wheeled) Gait Pattern/deviations: Step-to pattern;Step-through pattern;Shuffle;Trunk flexed     General Gait Details: cues for posture, position from RW and initial sequence   Stairs Stairs: Yes Stairs assistance: Min assist Stair Management: Two rails;Forwards;Step to pattern Number of Stairs: 2    Wheelchair Mobility    Modified Rankin (Stroke Patients Only)       Balance                                    Cognition Arousal/Alertness: Awake/alert Behavior During Therapy: WFL for tasks assessed/performed Overall Cognitive Status: Within Functional Limits for tasks assessed                      Exercises      General Comments        Pertinent Vitals/Pain 3/10; premed, ice packs provided    Home Living                      Prior Function            PT Goals (current goals can now be found in the care plan section) Acute Rehab PT Goals Patient Stated Goal: Resume previous lifestyle  with decreased pain PT Goal Formulation: With patient Time For Goal Achievement: 01/12/14 Potential to Achieve Goals: Good Progress towards PT goals: Progressing toward goals    Frequency  7X/week    PT Plan Current plan remains appropriate    Co-evaluation             End of Session Equipment Utilized During Treatment: Gait belt Activity Tolerance: Patient tolerated treatment well Patient left: Other (comment) (sitting at EOB)     Time: 1191-4782 PT Time Calculation (min): 15 min  Charges:  $Gait Training: 8-22 mins                    G Codes:      Alex Edwards Feb 02, 2014, 4:56 PM

## 2014-01-05 NOTE — Evaluation (Signed)
Physical Therapy Evaluation Patient Details Name: Alex Edwards MRN: [id number] DOB: 1936-02-17 Today's Date: 01/05/2014   History of Present Illness  L THR  Clinical Impression  Pt s/p L THR presents with decreased L LE strength/ROM and post op pain limiting functional mobility.  Pt should progress well to d/c home with family assist and HHPt follow up.    Follow Up Recommendations Home health PT    Equipment Recommendations  None recommended by PT    Recommendations for Other Services OT consult     Precautions / Restrictions Precautions Precautions: Fall Restrictions Weight Bearing Restrictions: No Other Position/Activity Restrictions: WBAT      Mobility  Bed Mobility Overal bed mobility: Needs Assistance Bed Mobility: Supine to Sit;Sit to Supine     Supine to sit: Min assist Sit to supine: Min assist   General bed mobility comments: cues for sequence and use of R LE to self assist  Transfers Overall transfer level: Needs assistance Equipment used: Rolling walker (2 wheeled) Transfers: Sit to/from Stand Sit to Stand: Min assist         General transfer comment: cues for LE management and use of UEs to self assist  Ambulation/Gait Ambulation/Gait assistance: Min assist Ambulation Distance (Feet): 147 Feet Assistive device: Rolling walker (2 wheeled) Gait Pattern/deviations: Step-to pattern;Decreased step length - right;Decreased step length - left;Shuffle;Trunk flexed     General Gait Details: cues for posture, position from RW and initial sequence  Stairs            Wheelchair Mobility    Modified Rankin (Stroke Patients Only)       Balance                                             Pertinent Vitals/Pain 4/10; premed, ice pack provided    Home Living Family/patient expects to be discharged to:: Private residence Living Arrangements: Spouse/significant other Available Help at Discharge: Family Type of Home:  House Home Access: Stairs to enter Entrance Stairs-Rails: None Entrance Stairs-Number of Steps: 2 Home Layout: One level Home Equipment: Environmental consultant - 2 wheels;Cane - single point;Bedside commode      Prior Function Level of Independence: Independent               Hand Dominance   Dominant Hand: Right    Extremity/Trunk Assessment   Upper Extremity Assessment: Overall WFL for tasks assessed           Lower Extremity Assessment: LLE deficits/detail   LLE Deficits / Details: 3-15 hip strength with AAROM at hip to 80 flex and 15 abd  Cervical / Trunk Assessment: Normal  Communication   Communication: No difficulties  Cognition Arousal/Alertness: Awake/alert Behavior During Therapy: WFL for tasks assessed/performed Overall Cognitive Status: Within Functional Limits for tasks assessed                      General Comments      Exercises Total Joint Exercises Ankle Circles/Pumps: AROM;Both;15 reps;Supine Quad Sets: AROM;Both;10 reps;Supine Heel Slides: AAROM;15 reps;Supine;Left Hip ABduction/ADduction: AAROM;Left;Supine;10 reps      Assessment/Plan    PT Assessment Patient needs continued PT services  PT Diagnosis Difficulty walking   PT Problem List Decreased activity tolerance;Decreased range of motion;Decreased strength;Decreased mobility;Decreased knowledge of use of DME;Pain  PT Treatment Interventions DME instruction;Gait training;Stair training;Functional mobility training;Therapeutic activities;Therapeutic exercise;Patient/family education  PT Goals (Current goals can be found in the Care Plan section) Acute Rehab PT Goals Patient Stated Goal: Resume previous lifestyle with decreased pain PT Goal Formulation: With patient Time For Goal Achievement: 01/12/14 Potential to Achieve Goals: Good    Frequency 7X/week   Barriers to discharge        Co-evaluation               End of Session Equipment Utilized During Treatment: Gait  belt Activity Tolerance: Patient tolerated treatment well Patient left: in chair;with call bell/phone within reach;with family/visitor present Nurse Communication: Mobility status         Time: 4742-5956 PT Time Calculation (min): 33 min   Charges:   PT Evaluation $Initial PT Evaluation Tier I: 1 Procedure PT Treatments $Gait Training: 8-22 mins $Therapeutic Exercise: 8-22 mins   PT G Codes:          Alex Edwards 01/05/2014, 1:14 PM

## 2014-01-06 LAB — CBC
HCT: 34.7 % — ABNORMAL LOW (ref 39.0–52.0)
HEMOGLOBIN: 11.7 g/dL — AB (ref 13.0–17.0)
MCH: 31.7 pg (ref 26.0–34.0)
MCHC: 33.7 g/dL (ref 30.0–36.0)
MCV: 94 fL (ref 78.0–100.0)
Platelets: 195 10*3/uL (ref 150–400)
RBC: 3.69 MIL/uL — AB (ref 4.22–5.81)
RDW: 12.6 % (ref 11.5–15.5)
WBC: 8.2 10*3/uL (ref 4.0–10.5)

## 2014-01-06 NOTE — Progress Notes (Addendum)
01/06/2014 1030 NCM spoke to pt and states no DME is needed. He has RW at home. Notified [facility] of pt dc home today. Jonnie Finner RN CCM Case Mgmt phone [phone]

## 2014-01-06 NOTE — Plan of Care (Signed)
Problem: Discharge Progression Outcomes Goal: Anticoagulant follow-up in place Outcome: Not Applicable Date Met:  01/06/14 asa     

## 2014-01-06 NOTE — Discharge Summary (Signed)
Physician Discharge Summary  Patient ID: KJELL BRANNEN MRN: [id number] DOB/AGE: February 16, 1936 78 y.o.  Admit date: 01/04/2014 Discharge date: 01/06/2014  Admission Diagnoses: Osteoarthritis left hip  Discharge Diagnoses:  Principal Problem:   Arthritis of left hip Active Problems:   Status post THR (total hip replacement)   Discharged Condition: stable  Hospital Course: Patient's hospital course was essentially unremarkable. He underwent total hip arthroplasty. Postoperatively he progressed well and was discharged to home in stable condition.  Consults: None  Significant Diagnostic Studies: labs: Routine labs  Treatments: surgery: See operative note  Discharge Exam: Blood pressure 140/70, pulse 94, temperature 98.3 F (36.8 C), temperature source Oral, resp. rate 20, height 6\' 1"  (1.854 m), weight 114.873 kg (253 lb 4 oz), SpO2 96.00%. Incision/Wound: clean dry and intact  Disposition: Final discharge disposition not confirmed  Discharge Instructions   Call MD / Call 911    Complete by:  As directed   If you experience chest pain or shortness of breath, CALL 911 and be transported to the hospital emergency room.  If you develope a fever above 101 F, pus (white drainage) or increased drainage or redness at the wound, or calf pain, call your surgeon's office.     Call MD / Call 911    Complete by:  As directed   If you experience chest pain or shortness of breath, CALL 911 and be transported to the hospital emergency room.  If you develope a fever above 101 F, pus (white drainage) or increased drainage or redness at the wound, or calf pain, call your surgeon's office.     Constipation Prevention    Complete by:  As directed   Drink plenty of fluids.  Prune juice may be helpful.  You may use a stool softener, such as Colace (over the counter) 100 mg twice a day.  Use MiraLax (over the counter) for constipation as needed.     Constipation Prevention    Complete by:  As directed   Drink plenty of fluids.  Prune juice may be helpful.  You may use a stool softener, such as Colace (over the counter) 100 mg twice a day.  Use MiraLax (over the counter) for constipation as needed.     Diet - low sodium heart healthy    Complete by:  As directed      Diet - low sodium heart healthy    Complete by:  As directed      Discharge instructions    Complete by:  As directed   Increase your activities as comfort allows. Expect left thigh and knee pain and swelling. Ice as needed. You can get your current dressing wet daily in the shower; leave your current dressing on for the next 6 days. In 6 days, you can remove your current dressing and start getting your actual incision and steristreips wet daily; then place a new dry dressing after each shower. Take an over-the-counter stool softener daily     Increase activity slowly as tolerated    Complete by:  As directed      Increase activity slowly as tolerated    Complete by:  As directed             Medication List    STOP taking these medications       naproxen sodium 220 MG tablet  Commonly known as:  ANAPROX      TAKE these medications       amLODipine 5 MG tablet  Commonly  known as:  NORVASC  Take 5 mg by mouth every morning.     aspirin 325 MG EC tablet  Take 1 tablet (325 mg total) by mouth 2 (two) times daily after a meal.     clobetasol cream 0.05 %  Commonly known as:  TEMOVATE  Apply 1 application topically daily. For psoriasis on hands     [employer] CRANBERRY 84-20 MG Caps  Generic drug:  Cranberry-Vitamin C  Take 1 tablet by mouth daily.     loratadine 10 MG tablet  Commonly known as:  CLARITIN  Take 10 mg by mouth daily.     LUCENTIS IO  Place into the right eye as directed. Every 5 to 6 weeks injections     oxyCODONE-acetaminophen 5-325 MG per tablet  Commonly known as:  ROXICET  Take 1-2 tablets by mouth every 4 (four) hours as needed.     vitamin C 1000 MG tablet  Take 1,000 mg by mouth daily.            Follow-up Information   Follow up with Mcarthur Rossetti, MD In 2 weeks.   Specialty:  Orthopedic Surgery   Contact information:   [place] [address] [phone]       Follow up with [facility])    Contact information:   [address] [place] [address] [phone]       Signed: Newt Minion 01/06/2014, 9:39 AM

## 2014-01-06 NOTE — Progress Notes (Signed)
Discharged from floor via w/c, family with pt. No changes in assessment. Alex Edwards  

## 2014-01-06 NOTE — Progress Notes (Signed)
Physical Therapy Treatment Patient Details Name: Alex Edwards MRN: [id number] DOB: 10-Nov-1935 Today's Date: 01/06/2014    History of Present Illness L THR    PT Comments    Pt tolerated well today and ready for Dc home with wife and son assisting.    Follow Up Recommendations  Home health PT     Equipment Recommendations  None recommended by PT    Recommendations for Other Services       Precautions / Restrictions Precautions Precautions: Fall Restrictions Weight Bearing Restrictions: No Other Position/Activity Restrictions: WBAT    Mobility  Bed Mobility                  Transfers Overall transfer level: Needs assistance Equipment used: Rolling walker (2 wheeled) Transfers: Sit to/from Stand Sit to Stand: Supervision            Ambulation/Gait Ambulation/Gait assistance: Supervision Ambulation Distance (Feet): 10 Feet Assistive device: Rolling walker (2 wheeled) Gait Pattern/deviations: Step-to pattern     General Gait Details: limited distance today for pt preference due to quad sore and buring and pt fine with walking ability    Stairs Stairs: Yes Stairs assistance: Min assist Stair Management: No rails;Forwards Number of Stairs: 2 General stair comments: practiced one up and one down with RW placed on step and supervision , cues for sequencing. then attemped without RW due to may not fit on step and used crutch and RW on one side. and Son and wife present.   Wheelchair Mobility    Modified Rankin (Stroke Patients Only)       Balance                                    Cognition Arousal/Alertness: Awake/alert Behavior During Therapy: WFL for tasks assessed/performed Overall Cognitive Status: Within Functional Limits for tasks assessed                      Exercises Total Joint Exercises Ankle Circles/Pumps:  (deducated on all exercises and handout given and reviewed)    General Comments General comments  (skin integrity, edema, etc.): Educated on car transfer, bed mobilkity , positioning throughout the day, activity during day withn DC home, and       Pertinent Vitals/Pain Pt with more pain today in L quad areas describes as burning. Told MD during our session. Was icing it and educated on mobility in moderation and positioning as well.      Home Living                      Prior Function            PT Goals (current goals can now be found in the care plan section) Acute Rehab PT Goals Patient Stated Goal: Resume previous lifestyle with decreased pain PT Goal Formulation: With patient Time For Goal Achievement: 01/12/14 Progress towards PT goals: Progressing toward goals    Frequency  7X/week    PT Plan Current plan remains appropriate    Co-evaluation             End of Session   Activity Tolerance: Patient tolerated treatment well Patient left: in chair     Time: 2233-6122 PT Time Calculation (min): 35 min  Charges:  $Gait Training: 8-22 mins $Therapeutic Exercise: 8-22 mins  G CodesClide Dales 01/06/2014, 10:14 AM Clide Dales, PT Pager: [phone] 01/06/2014

## 2014-02-11 ENCOUNTER — Other Ambulatory Visit (HOSPITAL_COMMUNITY): Payer: Self-pay | Admitting: Orthopaedic Surgery

## 2014-02-11 NOTE — Patient Instructions (Addendum)
[place]  02/12/2014   Your procedure is scheduled on: Thursday 02/14/14  Report to [facility] at 05:30 AM.  Call this number if you have problems the morning of surgery 336-: [phone]   Remember:   Do not eat food or drink liquids After Midnight.     Take these medicines the morning of surgery with A SIP OF WATER: claritin   Do not wear jewelry, make-up or nail polish.  Do not wear lotions, powders, or perfumes. You may wear deodorant.  Do not shave 48 hours prior to surgery. Men may shave face and neck.  Do not bring valuables to the hospital.  Contacts, dentures or bridgework may not be worn into surgery.  Leave suitcase in the car. After surgery it may be brought to your room.  For patients admitted to the hospital, checkout time is 11:00 AM the day of discharge.   Please read over the following fact sheets that you were given:[facility] [place], RN  pre op nurse call if needed [phone]    [facility] - Preparing for Surgery Before surgery, you can play an important role.  Because skin is not sterile, your skin needs to be as free of germs as possible.  You can reduce the number of germs on your skin by washing with CHG (chlorahexidine gluconate) soap before surgery.  CHG is an antiseptic cleaner which kills germs and bonds with the skin to continue killing germs even after washing. Please DO NOT use if you have an allergy to CHG or antibacterial soaps.  If your skin becomes reddened/irritated stop using the CHG and inform your nurse when you arrive at Short Stay. Do not shave (including legs and underarms) for at least 48 hours prior to the first CHG shower.  You may shave your face/neck. Please follow these instructions carefully:  1.  Shower with CHG Soap the night before surgery and the  morning of Surgery.  2.  If you choose to wash your hair, wash your hair first as usual with your  normal  shampoo.  3.  After you shampoo, rinse your  hair and body thoroughly to remove the  shampoo.                            4.  Use CHG as you would any other liquid soap.  You can apply chg directly  to the skin and wash                       Gently with a scrungie or clean washcloth.  5.  Apply the CHG Soap to your body ONLY FROM THE NECK DOWN.   Do not use on face/ open                           Wound or open sores. Avoid contact with eyes, ears mouth and genitals (private parts).                       Wash face,  Genitals (private parts) with your normal soap.             6.  Wash thoroughly, paying special attention to the area where your surgery  will be performed.  7.  Thoroughly rinse your body with warm water from the neck down.  8.  DO NOT  shower/wash with your normal soap after using and rinsing off  the CHG Soap.                9.  Pat yourself dry with a clean towel.            10.  Wear clean pajamas.            11.  Place clean sheets on your bed the night of your first shower and do not  sleep with pets. Day of Surgery : Do not apply any lotions/deodorants the morning of surgery.  Please wear clean clothes to the hospital/surgery center.  FAILURE TO FOLLOW THESE INSTRUCTIONS MAY RESULT IN THE CANCELLATION OF YOUR SURGERY PATIENT SIGNATURE_________________________________  NURSE SIGNATURE__________________________________  ________________________________________________________________________  WHAT IS A BLOOD TRANSFUSION? Blood Transfusion Information  A transfusion is the replacement of blood or some of its parts. Blood is made up of multiple cells which provide different functions.  Red blood cells carry oxygen and are used for blood loss replacement.  White blood cells fight against infection.  Platelets control bleeding.  Plasma helps clot blood.  Other blood products are available for specialized needs, such as hemophilia or other clotting disorders. BEFORE THE TRANSFUSION  Who gives blood for  transfusions?   Healthy volunteers who are fully evaluated to make sure their blood is safe. This is blood bank blood. Transfusion therapy is the safest it has ever been in the practice of medicine. Before blood is taken from a donor, a complete history is taken to make sure that person has no history of diseases nor engages in risky social behavior (examples are intravenous drug use or sexual activity with multiple partners). The donor's travel history is screened to minimize risk of transmitting infections, such as malaria. The donated blood is tested for signs of infectious diseases, such as HIV and hepatitis. The blood is then tested to be sure it is compatible with you in order to minimize the chance of a transfusion reaction. If you or a relative donates blood, this is often done in anticipation of surgery and is not appropriate for emergency situations. It takes many days to process the donated blood. RISKS AND COMPLICATIONS Although transfusion therapy is very safe and saves many lives, the main dangers of transfusion include:   Getting an infectious disease.  Developing a transfusion reaction. This is an allergic reaction to something in the blood you were given. Every precaution is taken to prevent this. The decision to have a blood transfusion has been considered carefully by your caregiver before blood is given. Blood is not given unless the benefits outweigh the risks. AFTER THE TRANSFUSION  Right after receiving a blood transfusion, you will usually feel much better and more energetic. This is especially true if your red blood cells have gotten low (anemic). The transfusion raises the level of the red blood cells which carry oxygen, and this usually causes an energy increase.  The nurse administering the transfusion will monitor you carefully for complications. HOME CARE INSTRUCTIONS  No special instructions are needed after a transfusion. You may find your energy is better. Speak with  your caregiver about any limitations on activity for underlying diseases you may have. SEEK MEDICAL CARE IF:   Your condition is not improving after your transfusion.  You develop redness or irritation at the intravenous (IV) site. SEEK IMMEDIATE MEDICAL CARE IF:  Any of the following symptoms occur over the next 12 hours:  Shaking chills.  You have a  temperature by mouth above 102 F (38.9 C), not controlled by medicine.  Chest, back, or muscle pain.  People around you feel you are not acting correctly or are confused.  Shortness of breath or difficulty breathing.  Dizziness and fainting.  You get a rash or develop hives.  You have a decrease in urine output.  Your urine turns a dark color or changes to pink, red, or brown. Any of the following symptoms occur over the next 10 days:  You have a temperature by mouth above 102 F (38.9 C), not controlled by medicine.  Shortness of breath.  Weakness after normal activity.  The white part of the eye turns yellow (jaundice).  You have a decrease in the amount of urine or are urinating less often.  Your urine turns a dark color or changes to pink, red, or brown. Document Released: 06/11/2000 Document Revised: 09/06/2011 Document Reviewed: 01/29/2008 [facility] Patient Information 2014 [place], [place].  _______________________________________________________________________

## 2014-02-11 NOTE — Progress Notes (Signed)
Surgery 02-14-14, pre op 02-12-14, please release orders into Epic Thanks

## 2014-02-12 ENCOUNTER — Encounter (HOSPITAL_COMMUNITY): Payer: Self-pay | Admitting: Pharmacy Technician

## 2014-02-12 ENCOUNTER — Encounter (HOSPITAL_COMMUNITY)
Admission: RE | Admit: 2014-02-12 | Discharge: 2014-02-12 | Disposition: A | Discharge status: Home / Self Care | Payer: Medicare Other | Source: Ambulatory Visit | Attending: Orthopaedic Surgery | Admitting: Orthopaedic Surgery

## 2014-02-12 ENCOUNTER — Encounter (HOSPITAL_COMMUNITY): Payer: Self-pay

## 2014-02-12 HISTORY — DX: Unspecified macular degeneration: H35.30

## 2014-02-12 HISTORY — DX: Spontaneous ecchymoses: R23.3

## 2014-02-12 HISTORY — DX: Other seasonal allergic rhinitis: J30.2

## 2014-02-12 HISTORY — DX: Other skin changes: R23.8

## 2014-02-12 LAB — BASIC METABOLIC PANEL
Anion gap: 12 (ref 5–15)
BUN: 9 mg/dL (ref 6–23)
CO2: 26 meq/L (ref 19–32)
Calcium: 9.8 mg/dL (ref 8.4–10.5)
Chloride: 101 mEq/L (ref 96–112)
Creatinine, Ser: 1 mg/dL (ref 0.50–1.35)
GFR calc Af Amer: 82 mL/min — ABNORMAL LOW (ref >90)
GFR calc non Af Amer: 71 mL/min — ABNORMAL LOW (ref >90)
GLUCOSE: 83 mg/dL (ref 70–99)
POTASSIUM: 4.8 meq/L (ref 3.7–5.3)
Sodium: 139 mEq/L (ref 137–147)

## 2014-02-12 LAB — CBC
HCT: 42.8 % (ref 39.0–52.0)
HEMOGLOBIN: 14 g/dL (ref 13.0–17.0)
MCH: 30.6 pg (ref 26.0–34.0)
MCHC: 32.7 g/dL (ref 30.0–36.0)
MCV: 93.4 fL (ref 78.0–100.0)
Platelets: 281 10*3/uL (ref 150–400)
RBC: 4.58 MIL/uL (ref 4.22–5.81)
RDW: 12.8 % (ref 11.5–15.5)
WBC: 5.7 10*3/uL (ref 4.0–10.5)

## 2014-02-12 LAB — URINALYSIS, ROUTINE W REFLEX MICROSCOPIC
Bilirubin Urine: NEGATIVE
Glucose, UA: NEGATIVE mg/dL
HGB URINE DIPSTICK: NEGATIVE
Ketones, ur: NEGATIVE mg/dL
Leukocytes, UA: NEGATIVE
NITRITE: NEGATIVE
PROTEIN: NEGATIVE mg/dL
Specific Gravity, Urine: 1.005 (ref 1.005–1.030)
Urobilinogen, UA: 0.2 mg/dL (ref 0.0–1.0)
pH: 6.5 (ref 5.0–8.0)

## 2014-02-12 LAB — PROTIME-INR
INR: 1.02 (ref 0.00–1.49)
Prothrombin Time: 13.4 seconds (ref 11.6–15.2)

## 2014-02-12 LAB — SURGICAL PCR SCREEN
MRSA, PCR: NEGATIVE
Staphylococcus aureus: NEGATIVE

## 2014-02-12 LAB — APTT: aPTT: 34 seconds (ref 24–37)

## 2014-02-12 NOTE — Progress Notes (Signed)
EKG 12/25/13 on EPIC, Chest x-ray 12/25/13 on EPIC, CT chest 01/03/14 on EPIC

## 2014-02-12 NOTE — Progress Notes (Signed)
02/12/14 1404  OBSTRUCTIVE SLEEP APNEA  Have you ever been diagnosed with sleep apnea through a sleep study? No  Do you snore loudly (loud enough to be heard through closed doors)?  0  Do you often feel tired, fatigued, or sleepy during the daytime? 0  Has anyone observed you stop breathing during your sleep? 0  Do you have, or are you being treated for high blood pressure? 1  BMI more than 35 kg/m2? 0  Age over 76 years old? 1  Neck circumference greater than 40 cm/16 inches? 1  Gender: 1  Obstructive Sleep Apnea Score 4  Score 4 or greater  Results sent to PCP

## 2014-02-13 NOTE — Anesthesia Preprocedure Evaluation (Addendum)
Anesthesia Evaluation  Patient identified by MRN, date of birth, ID band Patient awake    Reviewed: Allergy & Precautions, H&P , NPO status , Patient's Chart, lab work & pertinent test results  Airway Mallampati: II TM Distance: >3 FB Neck ROM: Full    Dental no notable dental hx.    Pulmonary Current Smoker,  breath sounds clear to auscultation  Pulmonary exam normal       Cardiovascular hypertension, Rhythm:Regular Rate:Normal     Neuro/Psych negative neurological ROS  negative psych ROS   GI/Hepatic negative GI ROS, Neg liver ROS,   Endo/Other  negative endocrine ROS  Renal/GU negative Renal ROS  negative genitourinary   Musculoskeletal negative musculoskeletal ROS (+)   Abdominal   Peds negative pediatric ROS (+)  Hematology negative hematology ROS (+)   Anesthesia Other Findings   Reproductive/Obstetrics negative OB ROS                        Anesthesia Physical Anesthesia Plan  ASA: III  Anesthesia Plan: Spinal   Post-op Pain Management:    Induction: Intravenous  Airway Management Planned: Simple Face Mask  Additional Equipment:   Intra-op Plan:   Post-operative Plan:   Informed Consent: I have reviewed the patients History and Physical, chart, labs and discussed the procedure including the risks, benefits and alternatives for the proposed anesthesia with the patient or authorized representative who has indicated his/her understanding and acceptance.   Dental advisory given  Plan Discussed with: CRNA and Surgeon  Anesthesia Plan Comments:         Anesthesia Quick Evaluation

## 2014-02-14 ENCOUNTER — Encounter (HOSPITAL_COMMUNITY)
Admission: RE | Disposition: A | Discharge status: Home / Self Care | Payer: Self-pay | Source: Ambulatory Visit | Attending: Orthopaedic Surgery

## 2014-02-14 ENCOUNTER — Inpatient Hospital Stay (HOSPITAL_COMMUNITY)
Admission: RE | Admit: 2014-02-14 | Discharge: 2014-02-15 | DRG: 482 | Disposition: A | Discharge status: Home / Self Care | Payer: Medicare Other | Source: Ambulatory Visit | Attending: Orthopaedic Surgery | Admitting: Orthopaedic Surgery

## 2014-02-14 ENCOUNTER — Encounter (HOSPITAL_COMMUNITY): Payer: Medicare Other | Admitting: Anesthesiology

## 2014-02-14 ENCOUNTER — Encounter (HOSPITAL_COMMUNITY): Payer: Self-pay | Admitting: *Deleted

## 2014-02-14 ENCOUNTER — Inpatient Hospital Stay (HOSPITAL_COMMUNITY): Payer: Medicare Other | Admitting: Anesthesiology

## 2014-02-14 DIAGNOSIS — Y831 Surgical operation with implant of artificial internal device as the cause of abnormal reaction of the patient, or of later complication, without mention of misadventure at the time of the procedure: Secondary | ICD-10-CM | POA: Diagnosis present

## 2014-02-14 DIAGNOSIS — J410 Simple chronic bronchitis: Secondary | ICD-10-CM | POA: Diagnosis present

## 2014-02-14 DIAGNOSIS — T8149XA Infection following a procedure, other surgical site, initial encounter: Secondary | ICD-10-CM

## 2014-02-14 DIAGNOSIS — Z860100 Personal history of colon polyps, unspecified: Secondary | ICD-10-CM

## 2014-02-14 DIAGNOSIS — E669 Obesity, unspecified: Secondary | ICD-10-CM | POA: Diagnosis present

## 2014-02-14 DIAGNOSIS — N529 Male erectile dysfunction, unspecified: Secondary | ICD-10-CM | POA: Diagnosis present

## 2014-02-14 DIAGNOSIS — Z6832 Body mass index (BMI) 32.0-32.9, adult: Secondary | ICD-10-CM | POA: Diagnosis not present

## 2014-02-14 DIAGNOSIS — Z85828 Personal history of other malignant neoplasm of skin: Secondary | ICD-10-CM

## 2014-02-14 DIAGNOSIS — Z7982 Long term (current) use of aspirin: Secondary | ICD-10-CM | POA: Diagnosis not present

## 2014-02-14 DIAGNOSIS — IMO0001 Reserved for inherently not codable concepts without codable children: Secondary | ICD-10-CM

## 2014-02-14 DIAGNOSIS — T8489XA Other specified complication of internal orthopedic prosthetic devices, implants and grafts, initial encounter: Secondary | ICD-10-CM | POA: Diagnosis present

## 2014-02-14 DIAGNOSIS — I10 Essential (primary) hypertension: Secondary | ICD-10-CM | POA: Diagnosis present

## 2014-02-14 DIAGNOSIS — Z96649 Presence of unspecified artificial hip joint: Secondary | ICD-10-CM | POA: Diagnosis not present

## 2014-02-14 DIAGNOSIS — B999 Unspecified infectious disease: Secondary | ICD-10-CM

## 2014-02-14 DIAGNOSIS — T8450XA Infection and inflammatory reaction due to unspecified internal joint prosthesis, initial encounter: Secondary | ICD-10-CM | POA: Diagnosis present

## 2014-02-14 DIAGNOSIS — F17200 Nicotine dependence, unspecified, uncomplicated: Secondary | ICD-10-CM | POA: Diagnosis present

## 2014-02-14 DIAGNOSIS — T814XXS Infection following a procedure, sequela: Secondary | ICD-10-CM

## 2014-02-14 DIAGNOSIS — E785 Hyperlipidemia, unspecified: Secondary | ICD-10-CM | POA: Diagnosis present

## 2014-02-14 DIAGNOSIS — T814XXA Infection following a procedure, initial encounter: Secondary | ICD-10-CM

## 2014-02-14 HISTORY — PX: TOTAL HIP ARTHROPLASTY: SHX124

## 2014-02-14 LAB — C-REACTIVE PROTEIN: CRP: <0.5 mg/dL — ABNORMAL LOW (ref <0.60)

## 2014-02-14 LAB — SEDIMENTATION RATE: SED RATE: 9 mm/h (ref 0–16)

## 2014-02-14 SURGERY — ARTHROPLASTY, HIP, TOTAL, ANTERIOR APPROACH
Anesthesia: Spinal | Site: Hip | Laterality: Left

## 2014-02-14 MED ORDER — DEXAMETHASONE SODIUM PHOSPHATE 10 MG/ML IJ SOLN
INTRAMUSCULAR | Status: AC
  Filled 2014-02-14: qty 1

## 2014-02-14 MED ORDER — FENTANYL CITRATE 0.05 MG/ML IJ SOLN
INTRAMUSCULAR | Status: DC | PRN
  Administered 2014-02-14: 50 ug via INTRAVENOUS

## 2014-02-14 MED ORDER — VANCOMYCIN HCL IN DEXTROSE 1-5 GM/200ML-% IV SOLN
INTRAVENOUS | Status: AC
  Filled 2014-02-14: qty 200

## 2014-02-14 MED ORDER — PROPOFOL 10 MG/ML IV BOLUS
INTRAVENOUS | Status: AC
  Filled 2014-02-14: qty 20

## 2014-02-14 MED ORDER — OXYCODONE HCL 5 MG PO TABS
5.0000 mg | ORAL_TABLET | ORAL | Status: DC | PRN
  Administered 2014-02-14: 10 mg via ORAL
  Filled 2014-02-14: qty 2

## 2014-02-14 MED ORDER — LACTATED RINGERS IV SOLN
INTRAVENOUS | Status: DC | PRN
  Administered 2014-02-14: 07:00:00 via INTRAVENOUS

## 2014-02-14 MED ORDER — ZOLPIDEM TARTRATE 5 MG PO TABS: 5.0000 mg | ORAL_TABLET | Freq: Every evening | ORAL | Status: DC | PRN

## 2014-02-14 MED ORDER — METOCLOPRAMIDE HCL 5 MG/ML IJ SOLN: 5.0000 mg | Freq: Three times a day (TID) | INTRAMUSCULAR | Status: DC | PRN

## 2014-02-14 MED ORDER — METHOCARBAMOL 500 MG PO TABS
500.0000 mg | ORAL_TABLET | Freq: Four times a day (QID) | ORAL | Status: DC | PRN
Start: 2014-02-14 — End: 2014-02-15

## 2014-02-14 MED ORDER — METOCLOPRAMIDE HCL 10 MG PO TABS: 5.0000 mg | ORAL_TABLET | Freq: Three times a day (TID) | ORAL | Status: DC | PRN

## 2014-02-14 MED ORDER — LIDOCAINE HCL (CARDIAC) 20 MG/ML IV SOLN
INTRAVENOUS | Status: DC | PRN
  Administered 2014-02-14: 100 mg via INTRAVENOUS

## 2014-02-14 MED ORDER — ONDANSETRON HCL 4 MG/2ML IJ SOLN
INTRAMUSCULAR | Status: DC | PRN
  Administered 2014-02-14: 4 mg via INTRAVENOUS

## 2014-02-14 MED ORDER — DEXTROSE 5 % IV SOLN
500.0000 mg | Freq: Four times a day (QID) | INTRAVENOUS | Status: DC | PRN
  Administered 2014-02-14: 500 mg via INTRAVENOUS
  Filled 2014-02-14: qty 5

## 2014-02-14 MED ORDER — LIDOCAINE HCL (CARDIAC) 20 MG/ML IV SOLN
INTRAVENOUS | Status: AC
  Filled 2014-02-14: qty 5

## 2014-02-14 MED ORDER — SODIUM CHLORIDE 0.9 % IR SOLN
Status: DC | PRN
  Administered 2014-02-14: 6000 mL

## 2014-02-14 MED ORDER — ATROPINE SULFATE 0.4 MG/ML IJ SOLN
INTRAMUSCULAR | Status: AC
  Filled 2014-02-14: qty 2

## 2014-02-14 MED ORDER — HYDROMORPHONE HCL PF 1 MG/ML IJ SOLN
0.2500 mg | INTRAMUSCULAR | Status: DC | PRN
  Administered 2014-02-14 (×4): 0.5 mg via INTRAVENOUS

## 2014-02-14 MED ORDER — HYDROMORPHONE HCL PF 1 MG/ML IJ SOLN
INTRAMUSCULAR | Status: AC
  Filled 2014-02-14: qty 1

## 2014-02-14 MED ORDER — LORATADINE 10 MG PO TABS
10.0000 mg | ORAL_TABLET | Freq: Every day | ORAL | Status: DC
  Administered 2014-02-14 – 2014-02-15 (×2): 10 mg via ORAL
  Filled 2014-02-14 (×2): qty 1

## 2014-02-14 MED ORDER — DIPHENHYDRAMINE HCL 12.5 MG/5ML PO ELIX: 12.5000 mg | ORAL_SOLUTION | ORAL | Status: DC | PRN

## 2014-02-14 MED ORDER — PROMETHAZINE HCL 25 MG/ML IJ SOLN: 6.2500 mg | INTRAMUSCULAR | Status: DC | PRN

## 2014-02-14 MED ORDER — MIDAZOLAM HCL 2 MG/2ML IJ SOLN
INTRAMUSCULAR | Status: AC
  Filled 2014-02-14: qty 2

## 2014-02-14 MED ORDER — ONDANSETRON HCL 4 MG/2ML IJ SOLN
INTRAMUSCULAR | Status: AC
  Filled 2014-02-14: qty 2

## 2014-02-14 MED ORDER — EPHEDRINE SULFATE 50 MG/ML IJ SOLN
INTRAMUSCULAR | Status: DC | PRN
  Administered 2014-02-14 (×3): 5 mg via INTRAVENOUS

## 2014-02-14 MED ORDER — MORPHINE SULFATE 2 MG/ML IJ SOLN: 2.0000 mg | INTRAMUSCULAR | Status: DC | PRN

## 2014-02-14 MED ORDER — DEXAMETHASONE SODIUM PHOSPHATE 10 MG/ML IJ SOLN
INTRAMUSCULAR | Status: DC | PRN
  Administered 2014-02-14: 10 mg via INTRAVENOUS

## 2014-02-14 MED ORDER — POLYETHYLENE GLYCOL 3350 17 G PO PACK
17.0000 g | PACK | Freq: Every day | ORAL | Status: DC | PRN
Start: 2014-02-14 — End: 2014-02-15

## 2014-02-14 MED ORDER — DOCUSATE SODIUM 100 MG PO CAPS
100.0000 mg | ORAL_CAPSULE | Freq: Two times a day (BID) | ORAL | Status: DC
  Administered 2014-02-14 – 2014-02-15 (×2): 100 mg via ORAL

## 2014-02-14 MED ORDER — SODIUM CHLORIDE 0.9 % IJ SOLN
INTRAMUSCULAR | Status: AC
  Filled 2014-02-14: qty 10

## 2014-02-14 MED ORDER — HYDROCODONE-ACETAMINOPHEN 5-325 MG PO TABS: 1.0000 | ORAL_TABLET | ORAL | Status: DC | PRN

## 2014-02-14 MED ORDER — PROPOFOL INFUSION 10 MG/ML OPTIME
INTRAVENOUS | Status: DC | PRN
  Administered 2014-02-14: 75 ug/kg/min via INTRAVENOUS

## 2014-02-14 MED ORDER — FENTANYL CITRATE 0.05 MG/ML IJ SOLN
INTRAMUSCULAR | Status: AC
  Filled 2014-02-14: qty 2

## 2014-02-14 MED ORDER — ASPIRIN EC 325 MG PO TBEC
325.0000 mg | DELAYED_RELEASE_TABLET | Freq: Two times a day (BID) | ORAL | Status: DC
  Administered 2014-02-14 – 2014-02-15 (×2): 325 mg via ORAL
  Filled 2014-02-14 (×4): qty 1

## 2014-02-14 MED ORDER — VANCOMYCIN HCL 1000 MG IV SOLR
1000.0000 mg | Freq: Two times a day (BID) | INTRAVENOUS | Status: DC
  Filled 2014-02-14: qty 1000

## 2014-02-14 MED ORDER — BUPIVACAINE IN DEXTROSE 0.75-8.25 % IT SOLN
INTRATHECAL | Status: DC | PRN
  Administered 2014-02-14: 2 mL via INTRATHECAL

## 2014-02-14 MED ORDER — ONDANSETRON HCL 4 MG/2ML IJ SOLN
4.0000 mg | Freq: Four times a day (QID) | INTRAMUSCULAR | Status: DC | PRN
Start: 2014-02-14 — End: 2014-02-15

## 2014-02-14 MED ORDER — VANCOMYCIN HCL 1000 MG IV SOLR
1000.0000 mg | INTRAVENOUS | Status: DC | PRN
  Administered 2014-02-14: 1000 mg via INTRAVENOUS

## 2014-02-14 MED ORDER — EPHEDRINE SULFATE 50 MG/ML IJ SOLN
INTRAMUSCULAR | Status: AC
  Filled 2014-02-14: qty 1

## 2014-02-14 MED ORDER — MIDAZOLAM HCL 5 MG/5ML IJ SOLN
INTRAMUSCULAR | Status: DC | PRN
  Administered 2014-02-14: 2 mg via INTRAVENOUS

## 2014-02-14 MED ORDER — SODIUM CHLORIDE 0.9 % IV SOLN
INTRAVENOUS | Status: DC
  Administered 2014-02-14: 12:00:00 via INTRAVENOUS

## 2014-02-14 MED ORDER — VITAMIN C 500 MG PO TABS
1000.0000 mg | ORAL_TABLET | Freq: Every day | ORAL | Status: DC
  Administered 2014-02-14 – 2014-02-15 (×2): 1000 mg via ORAL
  Filled 2014-02-14 (×2): qty 2

## 2014-02-14 MED ORDER — VANCOMYCIN HCL IN DEXTROSE 1-5 GM/200ML-% IV SOLN
1000.0000 mg | Freq: Two times a day (BID) | INTRAVENOUS | Status: DC
  Administered 2014-02-14 – 2014-02-15 (×3): 1000 mg via INTRAVENOUS
  Filled 2014-02-14 (×3): qty 200

## 2014-02-14 MED ORDER — ONDANSETRON HCL 4 MG PO TABS: 4.0000 mg | ORAL_TABLET | Freq: Four times a day (QID) | ORAL | Status: DC | PRN

## 2014-02-14 SURGICAL SUPPLY — 32 items
BLADE SAW SGTL 18X1.27X75 (BLADE) ×2 IMPLANT
BLADE SAW SGTL 18X1.27X75MM (BLADE) ×1
CELLS DAT CNTRL 66122 CELL SVR (MISCELLANEOUS) ×1 IMPLANT
COVER PERINEAL POST (MISCELLANEOUS) ×3 IMPLANT
DRAPE STERI IOBAN 125X83 (DRAPES) ×3 IMPLANT
DRAPE U-SHAPE 47X51 STRL (DRAPES) ×9 IMPLANT
DRSG AQUACEL AG ADV 3.5X10 (GAUZE/BANDAGES/DRESSINGS) ×3 IMPLANT
DURAPREP 26ML APPLICATOR (WOUND CARE) ×3 IMPLANT
ELECT BLADE TIP CTD 4 INCH (ELECTRODE) ×3 IMPLANT
ELECT REM PT RETURN 9FT ADLT (ELECTROSURGICAL) ×3
ELECTRODE REM PT RTRN 9FT ADLT (ELECTROSURGICAL) ×1 IMPLANT
FACESHIELD WRAPAROUND (MASK) ×12 IMPLANT
GAUZE XEROFORM 1X8 LF (GAUZE/BANDAGES/DRESSINGS) ×3 IMPLANT
GLOVE BIO SURGEON STRL SZ7.5 (GLOVE) ×3 IMPLANT
GLOVE BIOGEL PI IND STRL 8 (GLOVE) ×2 IMPLANT
GLOVE BIOGEL PI INDICATOR 8 (GLOVE) ×4
GLOVE ECLIPSE 8.0 STRL XLNG CF (GLOVE) ×3 IMPLANT
GOWN STRL REUS W/TWL XL LVL3 (GOWN DISPOSABLE) ×6 IMPLANT
HANDPIECE INTERPULSE COAX TIP (DISPOSABLE) ×2
KIT BASIN OR (CUSTOM PROCEDURE TRAY) ×3 IMPLANT
PACK TOTAL JOINT (CUSTOM PROCEDURE TRAY) ×3 IMPLANT
RTRCTR WOUND ALEXIS 18CM MED (MISCELLANEOUS) ×3
SET HNDPC FAN SPRY TIP SCT (DISPOSABLE) ×1 IMPLANT
STAPLER VISISTAT 35W (STAPLE) ×3 IMPLANT
SUT ETHIBOND NAB CT1 #1 30IN (SUTURE) IMPLANT
SUT VIC AB 0 CT1 36 (SUTURE) ×6 IMPLANT
SUT VIC AB 1 CT1 36 (SUTURE) ×6 IMPLANT
SUT VIC AB 2-0 CT1 27 (SUTURE) ×4
SUT VIC AB 2-0 CT1 TAPERPNT 27 (SUTURE) ×2 IMPLANT
SWAB COLLECTION DEVICE MRSA (MISCELLANEOUS) ×3 IMPLANT
TOWEL OR 17X26 10 PK STRL BLUE (TOWEL DISPOSABLE) ×3 IMPLANT
TOWEL OR NON WOVEN STRL DISP B (DISPOSABLE) ×3 IMPLANT

## 2014-02-14 NOTE — Anesthesia Procedure Notes (Signed)

## 2014-02-14 NOTE — Care Management Note (Unsigned)
    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     Action/Plan:   discharge planning   Anticipated DC Date:  02/16/2014   Anticipated DC Plan:  [place]  CM consult      [facility] Choice  HOME HEALTH   Choice offered to / List presented to:          [facility] arranged  HH-1 RN      [place]   Status of service:  In process, will continue to follow Medicare Important Message given?   (If response is "NO", the following Medicare IM given date fields will be blank) Date Medicare IM given:   Medicare IM given by:   Date Additional Medicare IM given:   Additional Medicare IM given by:    Discharge Disposition:    Per UR Regulation:    If discussed at Long Length of Stay Meetings, dates discussed:    Comments:  02/14/14 CM made aware by [facility] rep, pt is set up with [facility] and will probably go home with IV ABX.  CM will follow as discharge plans progress.  Mariane Masters, BSN, CM [phone].

## 2014-02-14 NOTE — Transfer of Care (Signed)
Immediate Anesthesia Transfer of Care Note  Patient: Alex Edwards  Procedure(s) Performed: Procedure(s): Irrigation and Debridement left hip (Left)  Patient Location: PACU  Anesthesia Type:MAC and Spinal  Level of Consciousness: awake, alert , oriented and patient cooperative  Airway & Oxygen Therapy: Patient Spontanous Breathing and Patient connected to face mask oxygen  Post-op Assessment: Report given to PACU RN and Post -op Vital signs reviewed and stable  Post vital signs: Reviewed and stable  Complications: No apparent anesthesia complications

## 2014-02-14 NOTE — Progress Notes (Signed)
ANTIBIOTIC CONSULT NOTE - INITIAL  Pharmacy Consult for vancomycin Indication: cellulitis at site of L THA  Allergies  Allergen Reactions  . Latex Other (See Comments)    "possibly allergic- dry, itching, rash"    Patient Measurements:   02/12/14:  Wt 112.5 kg Ht 185 cm  Vital Signs: Temp: 97.7 F (36.5 C) (08/20 1013) Temp src: Oral (08/20 0531) BP: 153/80 mmHg (08/20 1013) Pulse Rate: 78 (08/20 1013) Intake/Output from previous day:   Intake/Output from this shift: Total I/O In: 1600 [I.V.:1300; IV Piggyback:300] Out: 120 [Urine:100; Blood:20]  Labs:  Recent Labs  02/12/14 1435  WBC 5.7  HGB 14.0  PLT 281  CREATININE 1.00   The CrCl is unknown because both a height and weight (above a minimum accepted value) are required for this calculation. No results found for this basename: VANCOTROUGH, Corlis Leak, VANCORANDOM, GENTTROUGH, GENTPEAK, GENTRANDOM, TOBRATROUGH, TOBRAPEAK, TOBRARND, AMIKACINPEAK, AMIKACINTROU, AMIKACIN,  in the last 72 hours   Microbiology: Recent Results (from the past 720 hour(s))  SURGICAL PCR SCREEN     Status: None   Collection Time    02/12/14  2:27 PM      Result Value Ref Range Status   MRSA, PCR NEGATIVE  NEGATIVE Final   Staphylococcus aureus NEGATIVE  NEGATIVE Final   Comment:            The Xpert SA Assay (FDA     approved for NASAL specimens     in patients over 76 years of age),     is one component of     a comprehensive surveillance     program.  Test performance has     been validated by [employer] for patients greater     than or equal to 62 year old.     It is not intended     to diagnose infection nor to     guide or monitor treatment.    Medical History: Past Medical History  Diagnosis Date  . Dyslipidemia   . Hx of adenomatous colonic polyps   . Psoriasis   . ED (erectile dysfunction)   . Smoker   . Obesity   . Cough     smokers cough or perfurmes  . Seasonal allergies   . Degeneration macular     right eye  . Hypertension     borderline  . BCE (basal cell epithelioma)   . Bruises easily     on hands    Medications:  Scheduled:  . aspirin EC  325 mg Oral BID PC  . docusate sodium  100 mg Oral BID  . HYDROmorphone      . HYDROmorphone      . loratadine  10 mg Oral Daily  . vitamin C  1,000 mg Oral Daily   Infusions:  . sodium chloride     PRN: diphenhydrAMINE, HYDROcodone-acetaminophen, methocarbamol (ROBAXIN) IV, methocarbamol, metoCLOPramide (REGLAN) injection, metoCLOPramide, morphine injection, ondansetron (ZOFRAN) IV, ondansetron, oxyCODONE, polyethylene glycol, zolpidem  Assessment: 78 y/o M 4 weeks s/p L THA by anterior approach, one week ago developed incisional breakdown with seroma and erythema at incision site without any pain, gross purulence, fever, or chills. Was treated as OP with oral doxycycline and local wound care. Underwent I&D 8/20 to wash out the operative site and assess for any deep infection. Vancomycin 1 gram IV was administered ~0800 in OR.  Postoperative orders received to continue vancomycin for cellulitis with pharmacy dosing assistance.   Goal of  Therapy:  Appropriate antibiotic dosing for renal function; eradication of infection. Vancomycin trough 10-15  Plan:  1. Will not attempt to complete the usual full loading dose from nomogram given the apparent absence of deep infection and the potential for nephrotoxicity in 78-year-old patient. 2. Vancomycin 1000 mg IV q12h as maintenance regimen.  Next dose at 2pm (approximately 6 hours after initial dose in OR). 3. Follow culture results, serum creatinine, clinical course. 4. If anticipated duration of vancomycin therapy is >72 hours, will check trough at steady-state.   Clayburn Pert, PharmD, BCPS Pager: [phone] 02/14/2014  10:53 AM

## 2014-02-14 NOTE — Anesthesia Postprocedure Evaluation (Signed)
  Anesthesia Post-op Note  Patient: Alex Edwards  Procedure(s) Performed: Procedure(s) (LRB): Irrigation and Debridement left hip (Left)  Patient Location: PACU  Anesthesia Type: Spinal  Level of Consciousness: awake and alert   Airway and Oxygen Therapy: Patient Spontanous Breathing  Post-op Pain: mild  Post-op Assessment: Post-op Vital signs reviewed, Patient's Cardiovascular Status Stable, Respiratory Function Stable, Patent Airway and No signs of Nausea or vomiting  Last Vitals:  Filed Vitals:   02/14/14 0930  BP: 147/70  Pulse: 74  Temp:   Resp: 23    Post-op Vital Signs: stable   Complications: No apparent anesthesia complications

## 2014-02-14 NOTE — Op Note (Signed)
Alex Edwards, Alex Edwards NO.:  [id number]  MEDICAL RECORD NO.:  [id number]  LOCATION:  [facility]                         FACILITY:  [facility]  PHYSICIAN:  Lind Guest. Ninfa Linden, M.D.DATE OF BIRTH:  Mar 26, 1936  DATE OF PROCEDURE:  02/14/2014 DATE OF DISCHARGE:                              OPERATIVE REPORT   PREOPERATIVE DIAGNOSIS:  Questionable left hip infection with recurrent seroma, 5 weeks status post total hip arthroplasty.  POSTOPERATIVE DIAGNOSIS:  Questionable left hip infection with recurrent seroma, 5 weeks status post total hip arthroplasty.  PROCEDURE:  Irrigation of left hip, superficial and deep tissues including the joint itself.  FINDINGS:  Seroma fluid only with no gross purulence at all and cultures pending, no necrotic tissue found.  SURGEON:  Lind Guest. Ninfa Linden, M.D.  ASSISTANT:  Erskine [name], PA-C.  ANESTHESIA:  Spinal.  ESTIMATED BLOOD LOSS:  Less than 100 mL.  COMPLICATIONS:  None.  ANTIBIOTICS:  An 1 g vancomycin following cultures.  INDICATIONS:  [name] is a very pleasant 78 year old gentleman who on January 05, 2014, underwent a direct anterior hip replacement of his left hip.  This was an uncomplicated surgery, and we did see him back regularly in the office.  At his 2-week followup visit, the incision looked good.  We removed the staples and placed Steri-Strips.  A little over a week later, he came back to the office and had a seroma that he developed and a small wound dehiscence.  We were able to drain the seroma fluid off the hip easily.  He had no pain, no fevers or chills, and a normal white blood cell count.  We treated the wound dehiscence with Bactroban ointment, and he was put on oral antibiotics.  We then saw him back in a week and into the next 2 weeks and his redness has gone away.  He still had just some slight induration and the wound had healed over, but some firmness of the soft tissues and still  recurrent seroma, although he continued to have no pain and seemed to be clinically improving.  I felt though it would be best at this point, 5 weeks postop, to at least assess the hip and to hopefully ward off any deep infection and to wash it out completely at this point.  He understands that if we do find gross purulence, then we would likely change out his hip ball and liner, pending our OR findings.  He is feeling better.  His peripheral white blood cell count is only 7 and his wound on the day of surgery has healed over completely and shows now just minimal redness which is more of bruising.  There is no drainage and there is no induration today.  PROCEDURE DESCRIPTION:  After informed consent was obtained, appropriate left hip was marked.  He was brought to the operating room and spinal anesthesia was obtained while he was on a stretcher.  Foley catheter was placed and he was placed supine on the Hana fracture table with both feet in traction boots, but no traction applied.  His left operative hip was prepped and draped with DuraPrep and sterile drapes.  Time-out was called  to identify the correct left hip.  I then made incision directly over his previous incision and dissected through the superficial tissue. I found minimal seroma fluid, but we did send this off for Gram stain and culture.  We then copiously irrigated the superficial tissues all the way down to the tensor fascia with 3 L normal saline solution.  I found no tissue necrosis.  We then opened up the tensor fascia and went down the hip joint and found appropriate fluid collection in this area, consistent again with postoperative changes in seroma, and then we easily suctioned this out, and again after changing out all instrumentation, thoroughly irrigated the hip joint, even pulling traction to open up the joint a little bit more, so we could irrigate an additional 3 L normal saline solution.  Once this was completed,  I closed the tensor fascia with interrupted #1 Vicryl suture followed by 0 Vicryl in the deep tissue, 2-0 Vicryl in subcutaneous tissue, interrupted staples on the skin.  I chose not to put any type of antibiotic beads down into the hip due to the condition we found on the hip and chose not to change out any components.  Xeroform and Aquacel dressing were applied.  He was taken off the Hana table to the recovery room in stable condition.  All final counts were correct and no complications noted.  Postoperatively, we will admit him for a continued IV antibiotics, to follow cultures with hopes of sending him home on a month's worth of oral antibiotics if this shows to be negative infection.     Lind Guest. Ninfa Linden, M.D.     CYB/MEDQ  D:  02/14/2014  T:  02/14/2014  Job:  [id number]

## 2014-02-14 NOTE — H&P (Signed)
Alex Edwards is an 78 y.o. male.   Chief Complaint:   Left hip incision redness and recurrent seroma post-op HPI:  78 yo male who is over 4 week out from a direct atnerior hip replacement of his left hip.Marland Kitchen Post-operatively at about 3 weeks out, he developed some incisional breakdown, a seroma, and redness of his incision.  We had no pain, no gross purulence, and no fever/chills.  Treated with local wound care and oral antibiotics.  Has improved greatly, but I would feel more comfortable washing out his hip and assessing for any deep infection.  He understands this fully and gives informed consent for surgery.  Past Medical History  Diagnosis Date  . Dyslipidemia   . Hx of adenomatous colonic polyps   . Psoriasis   . ED (erectile dysfunction)   . Smoker   . Obesity   . Cough     smokers cough or perfurmes  . Seasonal allergies   . Degeneration macular     right eye  . Hypertension     borderline  . BCE (basal cell epithelioma)   . Bruises easily     on hands    Past Surgical History  Procedure Laterality Date  . Colonoscopy  2005    [facility]  . Polypectomy    . Cataract extraction, bilateral Bilateral 2013  . Total hip arthroplasty Left 01/04/2014    Procedure: LEFT TOTAL HIP ARTHROPLASTY ANTERIOR APPROACH;  Surgeon: Mcarthur Rossetti, MD;  Location: [facility];  Service: Orthopedics;  Laterality: Left;  . Skin cancer excision Right 1993    under eye-MOHS, freeze multiple places freq  . Tonsillectomy  as child    Family History  Problem Relation Age of Onset  . Heart disease Brother    Social History:  reports that he has been smoking Cigarettes.  He has been smoking about 0.50 packs per day. He has never used smokeless tobacco. He reports that he does not drink alcohol or use illicit drugs.  Allergies:  Allergies  Allergen Reactions  . Latex Other (See Comments)    "possibly allergic- dry, itching, rash"    Medications Prior to Admission  Medication Sig Dispense  Refill  . Ascorbic Acid (VITAMIN C) 1000 MG tablet Take 1,000 mg by mouth daily.      . clobetasol cream (TEMOVATE) 7.34 % Apply 1 application topically daily. For psoriasis on hands      . Cranberry-Vitamin C ([employer] CRANBERRY) 84-20 MG CAPS Take 1 tablet by mouth daily.      Marland Kitchen doxycycline (DORYX) 100 MG EC tablet Take 100 mg by mouth 2 (two) times daily.      Marland Kitchen loratadine (CLARITIN) 10 MG tablet Take 10 mg by mouth daily.      Marland Kitchen oxyCODONE-acetaminophen (ROXICET) 5-325 MG per tablet Take 1-2 tablets by mouth every 4 (four) hours as needed.  60 tablet  0  . Ranibizumab (LUCENTIS IO) Place into the right eye as directed. Every 5 to 6 weeks injections      . aspirin EC 325 MG EC tablet Take 1 tablet (325 mg total) by mouth 2 (two) times daily after a meal.  30 tablet  0    Results for orders placed during the hospital encounter of 02/12/14 (from the past 48 hour(s))  URINALYSIS, ROUTINE W REFLEX MICROSCOPIC     Status: None   Collection Time    02/12/14  2:27 PM      Result Value Ref Range   Color,  Urine YELLOW  YELLOW   APPearance CLEAR  CLEAR   Specific Gravity, Urine 1.005  1.005 - 1.030   pH 6.5  5.0 - 8.0   Glucose, UA NEGATIVE  NEGATIVE mg/dL   Hgb urine dipstick NEGATIVE  NEGATIVE   Bilirubin Urine NEGATIVE  NEGATIVE   Ketones, ur NEGATIVE  NEGATIVE mg/dL   Protein, ur NEGATIVE  NEGATIVE mg/dL   Urobilinogen, UA 0.2  0.0 - 1.0 mg/dL   Nitrite NEGATIVE  NEGATIVE   Leukocytes, UA NEGATIVE  NEGATIVE   Comment: MICROSCOPIC NOT DONE ON URINES WITH NEGATIVE PROTEIN, BLOOD, LEUKOCYTES, NITRITE, OR GLUCOSE <1000 mg/dL.  SURGICAL PCR SCREEN     Status: None   Collection Time    02/12/14  2:27 PM      Result Value Ref Range   MRSA, PCR NEGATIVE  NEGATIVE   Staphylococcus aureus NEGATIVE  NEGATIVE   Comment:            The Xpert SA Assay (FDA     approved for NASAL specimens     in patients over 6 years of age),     is one component of     a comprehensive surveillance     program.   Test performance has     been validated by [employer] for patients greater     than or equal to 81 year old.     It is not intended     to diagnose infection nor to     guide or monitor treatment.  CBC     Status: None   Collection Time    02/12/14  2:35 PM      Result Value Ref Range   WBC 5.7  4.0 - 10.5 K/uL   RBC 4.58  4.22 - 5.81 MIL/uL   Hemoglobin 14.0  13.0 - 17.0 g/dL   HCT 42.8  39.0 - 52.0 %   MCV 93.4  78.0 - 100.0 fL   MCH 30.6  26.0 - 34.0 pg   MCHC 32.7  30.0 - 36.0 g/dL   RDW 12.8  11.5 - 15.5 %   Platelets 281  150 - 400 K/uL  BASIC METABOLIC PANEL     Status: Abnormal   Collection Time    02/12/14  2:35 PM      Result Value Ref Range   Sodium 139  137 - 147 mEq/L   Potassium 4.8  3.7 - 5.3 mEq/L   Chloride 101  96 - 112 mEq/L   CO2 26  19 - 32 mEq/L   Glucose, Bld 83  70 - 99 mg/dL   BUN 9  6 - 23 mg/dL   Creatinine, Ser 1.00  0.50 - 1.35 mg/dL   Calcium 9.8  8.4 - 10.5 mg/dL   GFR calc non Af Amer 71 (*) >90 mL/min   GFR calc Af Amer 82 (*) >90 mL/min   Comment: (NOTE)     The eGFR has been calculated using the CKD EPI equation.     This calculation has not been validated in all clinical situations.     eGFR's persistently <90 mL/min signify possible Chronic Kidney     Disease.   Anion gap 12  5 - 15  PROTIME-INR     Status: None   Collection Time    02/12/14  2:35 PM      Result Value Ref Range   Prothrombin Time 13.4  11.6 - 15.2 seconds   INR  1.02  0.00 - 1.49  APTT     Status: None   Collection Time    02/12/14  2:35 PM      Result Value Ref Range   aPTT 34  24 - 37 seconds   No results found.  Review of Systems  All other systems reviewed and are negative.   Blood pressure 156/80, pulse 103, temperature 98.3 F (36.8 C), temperature source Oral, resp. rate 20, SpO2 93.00%. Physical Exam  Constitutional: He is oriented to person, place, and time. He appears well-developed and well-nourished.  HENT:  Head: Atraumatic.  Eyes:  EOM are normal. Pupils are equal, round, and reactive to light.  Neck: Normal range of motion. Neck supple.  Cardiovascular: Normal rate and regular rhythm.   Respiratory: Effort normal and breath sounds normal.  GI: Soft. Bowel sounds are normal.  Musculoskeletal:       Legs: Neurological: He is alert and oriented to person, place, and time.  Skin: Skin is warm and dry.  Psychiatric: He has a normal mood and affect.     Assessment/Plan Left hip with recurrent seroma status-post left total hip arthroplasty with questionable infection 1)  To the OR today for irrigation of his left hip superficial and deep.  May change out hip ball and liner pending intra-operative findings. 2)  Admission as an inpatient following surgery for IV antibiotics.  Elbony Mcclimans Y 02/14/2014, 7:16 AM

## 2014-02-14 NOTE — Brief Op Note (Signed)
02/14/2014  9:06 AM  PATIENT:  Alex Edwards  78 y.o. male  PRE-OPERATIVE DIAGNOSIS:  Left hip wound dehiscence s/p total joint, possible infection  POST-OPERATIVE DIAGNOSIS:  Left hip wound dehiscence s/p total joint  PROCEDURE:  Procedure(s): Irrigation and Debridement left hip (Left)  SURGEON:  Surgeon(s) and Role:    * Mcarthur Rossetti, MD - Primary  PHYSICIAN ASSISTANT: Benita Stabile, PA-C  ANESTHESIA:   spinal  EBL:  Total I/O In: 1400 [I.V.:1150; IV Piggyback:250] Out: 120 [Urine:100; Blood:20]  BLOOD ADMINISTERED:none  DRAINS: none   COUNTS:  YES  TOURNIQUET:  * No tourniquets in log *  DICTATION: .Other Dictation: Dictation Number [id number]  PLAN OF CARE: Admit to inpatient   PATIENT DISPOSITION:  PACU - hemodynamically stable.   Delay start of Pharmacological VTE agent (>24hrs) due to surgical blood loss or risk of bleeding: no

## 2014-02-15 ENCOUNTER — Encounter (HOSPITAL_COMMUNITY): Payer: Self-pay | Admitting: Orthopaedic Surgery

## 2014-02-15 LAB — CBC
HCT: 35.3 % — ABNORMAL LOW (ref 39.0–52.0)
Hemoglobin: 11.8 g/dL — ABNORMAL LOW (ref 13.0–17.0)
MCH: 31.1 pg (ref 26.0–34.0)
MCHC: 33.4 g/dL (ref 30.0–36.0)
MCV: 92.9 fL (ref 78.0–100.0)
PLATELETS: 242 10*3/uL (ref 150–400)
RBC: 3.8 MIL/uL — ABNORMAL LOW (ref 4.22–5.81)
RDW: 12.8 % (ref 11.5–15.5)
WBC: 9.3 10*3/uL (ref 4.0–10.5)

## 2014-02-15 LAB — BASIC METABOLIC PANEL
ANION GAP: 11 (ref 5–15)
BUN: 13 mg/dL (ref 6–23)
CALCIUM: 8.8 mg/dL (ref 8.4–10.5)
CHLORIDE: 101 meq/L (ref 96–112)
CO2: 25 mEq/L (ref 19–32)
CREATININE: 0.97 mg/dL (ref 0.50–1.35)
GFR calc non Af Amer: 78 mL/min — ABNORMAL LOW (ref >90)
Glucose, Bld: 137 mg/dL — ABNORMAL HIGH (ref 70–99)
Potassium: 4.6 mEq/L (ref 3.7–5.3)
Sodium: 137 mEq/L (ref 137–147)

## 2014-02-15 MED ORDER — DOXYCYCLINE HYCLATE 100 MG PO TBEC: 100.0000 mg | DELAYED_RELEASE_TABLET | Freq: Two times a day (BID) | ORAL | Status: DC

## 2014-02-15 MED ORDER — HYDROCODONE-ACETAMINOPHEN 5-325 MG PO TABS: 1.0000 | ORAL_TABLET | ORAL | Status: DC | PRN

## 2014-02-15 NOTE — Progress Notes (Signed)
Subjective: 1 Day Post-Op Procedure(s) (LRB): Irrigation and Debridement left hip (Left) Patient reports pain as mild.  Gram stain and cultures negative.  ESR, CR-P, WBC all normal.  Objective: Vital signs in last 24 hours: Temp:  [97.7 F (36.5 C)-98.7 F (37.1 C)] 98.5 F (36.9 C) (08/21 0625) Pulse Rate:  [74-98] 80 (08/21 0625) Resp:  [15-18] 18 (08/21 0625) BP: (110-153)/(53-88) 132/88 mmHg (08/21 0625) SpO2:  [94 %-100 %] 98 % (08/21 0625) Weight:  [112.492 kg (248 lb)] 112.492 kg (248 lb) (08/20 1015)  Intake/Output from previous day: 08/20 0701 - 08/21 0700 In: 2980 [P.O.:960; I.V.:1520; IV Piggyback:500] Out: 3151 [Urine:3225; Blood:20] Intake/Output this shift: Total I/O In: 360 [P.O.:360] Out: 550 [Urine:550]   Recent Labs  02/12/14 1435 02/15/14 0455  HGB 14.0 11.8*    Recent Labs  02/12/14 1435 02/15/14 0455  WBC 5.7 9.3  RBC 4.58 3.80*  HCT 42.8 35.3*  PLT 281 242    Recent Labs  02/12/14 1435 02/15/14 0455  NA 139 137  K 4.8 4.6  CL 101 101  CO2 26 25  BUN 9 13  CREATININE 1.00 0.97  GLUCOSE 83 137*  CALCIUM 9.8 8.8    Recent Labs  02/12/14 1435  INR 1.02    Sensation intact distally Intact pulses distally Dorsiflexion/Plantar flexion intact Incision: no drainage No cellulitis present Compartment soft  Assessment/Plan: 1 Day Post-Op Procedure(s) (LRB): Irrigation and Debridement left hip (Left) Can discharge to home today on oral antibiotics.  There is no evidence of active infection and he is doing well.  Has follow-up in 2 weeks.  BLACKMAN,CHRISTOPHER Y 02/15/2014, 9:56 AM

## 2014-02-15 NOTE — Discharge Summary (Signed)
Patient ID: Alex Edwards MRN: [id number] DOB/AGE: 1936-05-07 78 y.o.  Admit date: 02/14/2014 Discharge date: 02/15/2014  Admission Diagnoses:  Principal Problem:   Questionable left hip postoperative wound infection   Discharge Diagnoses:  Same  Past Medical History  Diagnosis Date  . Dyslipidemia   . Hx of adenomatous colonic polyps   . Psoriasis   . ED (erectile dysfunction)   . Smoker   . Obesity   . Cough     smokers cough or perfurmes  . Seasonal allergies   . Degeneration macular     right eye  . Hypertension     borderline  . BCE (basal cell epithelioma)   . Bruises easily     on hands    Surgeries: Procedure(s): Irrigation and Debridement left hip on 02/14/2014   Consultants:    Discharged Condition: Improved  Hospital Course: Alex Edwards is an 78 y.o. male who was admitted 02/14/2014 for operative treatment ofLeft hip postoperative wound infection. Patient has severe unremitting pain that affects sleep, daily activities, and work/hobbies. After pre-op clearance the patient was taken to the operating room on 02/14/2014 and underwent  Procedure(s): Irrigation and Debridement left hip.    Patient was given perioperative antibiotics: Anti-infectives   Start     Dose/Rate Route Frequency Ordered Stop   02/15/14 0000  doxycycline (DORYX) 100 MG EC tablet     100 mg Oral 2 times daily 02/15/14 0958     02/14/14 1400  vancomycin (VANCOCIN) IVPB 1000 mg/200 mL premix     1,000 mg 200 mL/hr over 60 Minutes Intravenous Every 12 hours 02/14/14 1049     02/14/14 1000  vancomycin (VANCOCIN) 1,000 mg in sodium chloride 0.9 % 250 mL IVPB  Status:  Discontinued     1,000 mg 250 mL/hr over 60 Minutes Intravenous Every 12 hours 02/14/14 0759 02/14/14 1014       Patient was given sequential compression devices, early ambulation, and chemoprophylaxis to prevent DVT.  Patient benefited maximally from hospital stay and there were no complications.  His infectious parameter  labs were all normal and he showed no signs of infection.  Recent vital signs: Patient Vitals for the past 24 hrs:  BP Temp Temp src Pulse Resp SpO2 Height Weight  02/15/14 0625 132/88 mmHg 98.5 F (36.9 C) Oral 80 18 98 % - -  02/15/14 0144 110/57 mmHg 98.2 F (36.8 C) Oral 75 17 95 % - -  02/14/14 2159 123/53 mmHg 98.7 F (37.1 C) Oral 84 17 94 % - -  02/14/14 1814 141/54 mmHg 98.3 F (36.8 C) Oral 98 18 96 % - -  02/14/14 1312 150/59 mmHg 98.2 F (36.8 C) Oral 92 16 96 % - -  02/14/14 1214 152/68 mmHg 98.1 F (36.7 C) Oral 89 16 95 % - -  02/14/14 1117 148/68 mmHg 97.9 F (36.6 C) Oral 78 16 96 % - -  02/14/14 1015 153/80 mmHg 98.1 F (36.7 C) Oral 74 16 97 % 6\' 1"  (1.854 m) 112.492 kg (248 lb)  02/14/14 1013 153/80 mmHg 97.7 F (36.5 C) - 78 - 97 % - -     Recent laboratory studies:  Recent Labs  02/12/14 1435 02/15/14 0455  WBC 5.7 9.3  HGB 14.0 11.8*  HCT 42.8 35.3*  PLT 281 242  NA 139 137  K 4.8 4.6  CL 101 101  CO2 26 25  BUN 9 13  CREATININE 1.00 0.97  GLUCOSE 83 137*  INR  1.02  --   CALCIUM 9.8 8.8     Discharge Medications:     Medication List    STOP taking these medications       oxyCODONE-acetaminophen 5-325 MG per tablet  Commonly known as:  ROXICET      TAKE these medications       aspirin 325 MG EC tablet  Take 1 tablet (325 mg total) by mouth 2 (two) times daily after a meal.     clobetasol cream 0.05 %  Commonly known as:  TEMOVATE  Apply 1 application topically daily. For psoriasis on hands     [employer] CRANBERRY 84-20 MG Caps  Generic drug:  Cranberry-Vitamin C  Take 1 tablet by mouth daily.     doxycycline 100 MG EC tablet  Commonly known as:  DORYX  Take 1 tablet (100 mg total) by mouth 2 (two) times daily.     HYDROcodone-acetaminophen 5-325 MG per tablet  Commonly known as:  NORCO/VICODIN  Take 1-2 tablets by mouth every 4 (four) hours as needed for moderate pain.     loratadine 10 MG tablet  Commonly known as:   CLARITIN  Take 10 mg by mouth daily.     LUCENTIS IO  Place into the right eye as directed. Every 5 to 6 weeks injections     vitamin C 1000 MG tablet  Take 1,000 mg by mouth daily.        Diagnostic Studies: No results found.  Disposition: to home      Discharge Instructions   Call MD / Call 911    Complete by:  As directed   If you experience chest pain or shortness of breath, CALL 911 and be transported to the hospital emergency room.  If you develope a fever above 101 F, pus (white drainage) or increased drainage or redness at the wound, or calf pain, call your surgeon's office.     Constipation Prevention    Complete by:  As directed   Drink plenty of fluids.  Prune juice may be helpful.  You may use a stool softener, such as Colace (over the counter) 100 mg twice a day.  Use MiraLax (over the counter) for constipation as needed.     Diet - low sodium heart healthy    Complete by:  As directed      Discharge instructions    Complete by:  As directed   Increase activities as comfort allows. Can get current dressing wet daily in the shower. Leave current dressing in place until your outpatient follow-up.     Discharge patient    Complete by:  As directed      Increase activity slowly as tolerated    Complete by:  As directed      Weight bearing as tolerated    Complete by:  As directed            Follow-up Information   Follow up with Mcarthur Rossetti, MD In 2 weeks.   Specialty:  Orthopedic Surgery   Contact information:   [place] [address] [phone]        Signed: Mcarthur Rossetti 02/15/2014, 10:00 AM

## 2014-02-15 NOTE — Progress Notes (Signed)
Physical Therapy Treatment Patient Details Name: CHAD DONOGHUE MRN: [id number] DOB: 02-Jul-1935 Today's Date: 02-28-2014    History of Present Illness L THR I&D    PT Comments    Motivated and very aware of abilities and limitations  Follow Up Recommendations  No PT follow up     Equipment Recommendations  None recommended by PT    Recommendations for Other Services       Precautions / Restrictions Precautions Precautions: Fall Restrictions Weight Bearing Restrictions: No    Mobility  Bed Mobility Overal bed mobility: Modified Independent                Transfers Overall transfer level: Modified independent   Transfers: Sit to/from Stand Sit to Stand: Modified independent (Device/Increase time)         General transfer comment: cues for use of UEs to self assist  Ambulation/Gait Ambulation/Gait assistance: Min guard;Supervision Ambulation Distance (Feet): 300 Feet Assistive device: Straight cane Gait Pattern/deviations: Step-through pattern;Shuffle;Antalgic     General Gait Details: Pt carried cane but did not utilize   Stairs Stairs: Yes Stairs assistance: Min guard Stair Management: No rails;Step to pattern;With cane Number of Stairs: 4 General stair comments: min cues for cane placement  Wheelchair Mobility    Modified Rankin (Stroke Patients Only)       Balance                                    Cognition Arousal/Alertness: Awake/alert Behavior During Therapy: WFL for tasks assessed/performed Overall Cognitive Status: Within Functional Limits for tasks assessed                      Exercises      General Comments        Pertinent Vitals/Pain Pain Assessment: 0-10 Pain Score: 3  Pain Location: L hip Pain Descriptors / Indicators: Aching Pain Intervention(s): Limited activity within patient's tolerance;Monitored during session    Home Living Family/patient expects to be discharged to:: Private  residence Living Arrangements: Spouse/significant other Available Help at Discharge: Family Type of Home: House Home Access: Stairs to enter Entrance Stairs-Rails: None Home Layout: One level Home Equipment: Environmental consultant - 2 wheels;Cane - single point;Bedside commode      Prior Function Level of Independence: Independent          PT Goals (current goals can now be found in the care plan section) Acute Rehab PT Goals Patient Stated Goal: Pick up where I left off with rehab PT Goal Formulation: With patient Time For Goal Achievement: 02/22/14 Potential to Achieve Goals: Good Progress towards PT goals: Progressing toward goals    Frequency  7X/week    PT Plan Current plan remains appropriate    Co-evaluation             End of Session Equipment Utilized During Treatment: Gait belt Activity Tolerance: Patient tolerated treatment well Patient left: in chair;with call bell/phone within reach;with family/visitor present     Time: 8676-7209 PT Time Calculation (min): 10 min  Charges:  $Gait Training: 8-22 mins                    G Codes:      Makeila Yamaguchi February 28, 2014, 1:07 PM

## 2014-02-15 NOTE — Evaluation (Signed)
Physical Therapy Evaluation Patient Details Name: Alex Edwards MRN: [id number] DOB: [birth year] Today's Date: 02/15/2014   History of Present Illness  L THR I&D  Clinical Impression  Pt s/p I&D L hip and mobilizing at supervision level.  Will follow up for stair training.  Pt will not need additional equipment or follow up HHPT.    Follow Up Recommendations No PT follow up    Equipment Recommendations  None recommended by PT    Recommendations for Other Services       Precautions / Restrictions Precautions Precautions: Fall Restrictions Weight Bearing Restrictions: No      Mobility  Bed Mobility Overal bed mobility: Modified Independent                Transfers Overall transfer level: Needs assistance   Transfers: Sit to/from Stand Sit to Stand: Supervision         General transfer comment: cues for use of UEs to self assist  Ambulation/Gait Ambulation/Gait assistance: Min guard;Supervision Ambulation Distance (Feet): 450 Feet Assistive device: None Gait Pattern/deviations: Step-through pattern;Shuffle;Antalgic     General Gait Details: Pt carried cane but did not utilize  Financial trader Rankin (Stroke Patients Only)       Balance                                             Pertinent Vitals/Pain Pain Assessment: 0-10 Pain Score: 2  Pain Location: L hip Pain Descriptors / Indicators: Aching Pain Intervention(s): Limited activity within patient's tolerance;Monitored during session    Home Living Family/patient expects to be discharged to:: Private residence Living Arrangements: Spouse/significant other Available Help at Discharge: Family Type of Home: House Home Access: Stairs to enter Entrance Stairs-Rails: None Entrance Stairs-Number of Steps: 2 Home Layout: One level Home Equipment: Environmental consultant - 2 wheels;Cane - single point;Bedside commode      Prior Function Level of  Independence: Independent               Hand Dominance   Dominant Hand: Right    Extremity/Trunk Assessment   Upper Extremity Assessment: Overall WFL for tasks assessed           Lower Extremity Assessment: LLE deficits/detail   LLE Deficits / Details: Min 3+/5 with AAROM WFL - pt able to don shoes unassisted  Cervical / Trunk Assessment: Normal  Communication   Communication: No difficulties  Cognition Arousal/Alertness: Awake/alert Behavior During Therapy: WFL for tasks assessed/performed Overall Cognitive Status: Within Functional Limits for tasks assessed                      General Comments      Exercises        Assessment/Plan    PT Assessment Patient needs continued PT services  PT Diagnosis Difficulty walking   PT Problem List Decreased strength;Decreased activity tolerance;Decreased mobility;Pain  PT Treatment Interventions DME instruction;Gait training;Stair training;Functional mobility training;Therapeutic activities;Therapeutic exercise;Patient/family education   PT Goals (Current goals can be found in the Care Plan section) Acute Rehab PT Goals Patient Stated Goal: Pick up where I left off with rehab PT Goal Formulation: With patient Time For Goal Achievement: 02/22/14 Potential to Achieve Goals: Good    Frequency 7X/week   Barriers to discharge  Co-evaluation               End of Session Equipment Utilized During Treatment: Gait belt Activity Tolerance: Patient tolerated treatment well Patient left: in chair;with call bell/phone within reach;with family/visitor present Nurse Communication: Mobility status         Time: 1828-8337 PT Time Calculation (min): 18 min   Charges:   PT Evaluation $Initial PT Evaluation Tier I: 1 Procedure     PT G Codes:          Alex Edwards 02/15/2014, 12:35 PM

## 2014-02-16 LAB — WOUND CULTURE: CULTURE: NO GROWTH

## 2014-02-17 ENCOUNTER — Emergency Department (HOSPITAL_COMMUNITY)
Admission: EM | Admit: 2014-02-17 | Discharge: 2014-02-17 | Disposition: A | Discharge status: Home / Self Care | Payer: Medicare Other | Attending: Emergency Medicine | Admitting: Emergency Medicine

## 2014-02-17 ENCOUNTER — Encounter (HOSPITAL_COMMUNITY): Payer: Self-pay | Admitting: Emergency Medicine

## 2014-02-17 DIAGNOSIS — Z79899 Other long term (current) drug therapy: Secondary | ICD-10-CM | POA: Diagnosis not present

## 2014-02-17 DIAGNOSIS — Z87448 Personal history of other diseases of urinary system: Secondary | ICD-10-CM | POA: Diagnosis not present

## 2014-02-17 DIAGNOSIS — Z872 Personal history of diseases of the skin and subcutaneous tissue: Secondary | ICD-10-CM | POA: Diagnosis not present

## 2014-02-17 DIAGNOSIS — Z8669 Personal history of other diseases of the nervous system and sense organs: Secondary | ICD-10-CM | POA: Insufficient documentation

## 2014-02-17 DIAGNOSIS — Z7982 Long term (current) use of aspirin: Secondary | ICD-10-CM | POA: Diagnosis not present

## 2014-02-17 DIAGNOSIS — T368X5A Adverse effect of other systemic antibiotics, initial encounter: Secondary | ICD-10-CM | POA: Diagnosis not present

## 2014-02-17 DIAGNOSIS — Z860100 Personal history of colon polyps, unspecified: Secondary | ICD-10-CM | POA: Insufficient documentation

## 2014-02-17 DIAGNOSIS — R21 Rash and other nonspecific skin eruption: Secondary | ICD-10-CM | POA: Diagnosis not present

## 2014-02-17 DIAGNOSIS — F17200 Nicotine dependence, unspecified, uncomplicated: Secondary | ICD-10-CM | POA: Insufficient documentation

## 2014-02-17 DIAGNOSIS — E669 Obesity, unspecified: Secondary | ICD-10-CM | POA: Diagnosis not present

## 2014-02-17 DIAGNOSIS — IMO0002 Reserved for concepts with insufficient information to code with codable children: Secondary | ICD-10-CM | POA: Diagnosis not present

## 2014-02-17 DIAGNOSIS — T7840XA Allergy, unspecified, initial encounter: Secondary | ICD-10-CM

## 2014-02-17 DIAGNOSIS — Z91040 Latex allergy status: Secondary | ICD-10-CM | POA: Insufficient documentation

## 2014-02-17 DIAGNOSIS — I10 Essential (primary) hypertension: Secondary | ICD-10-CM | POA: Diagnosis not present

## 2014-02-17 MED ORDER — FAMOTIDINE 20 MG PO TABS
20.0000 mg | ORAL_TABLET | Freq: Once | ORAL | Status: AC
  Administered 2014-02-17: 20 mg via ORAL
  Filled 2014-02-17: qty 1

## 2014-02-17 MED ORDER — LORATADINE 10 MG PO TABS
10.0000 mg | ORAL_TABLET | ORAL | Status: AC
  Administered 2014-02-17: 10 mg via ORAL
  Filled 2014-02-17: qty 1

## 2014-02-17 MED ORDER — PREDNISONE 20 MG PO TABS: ORAL_TABLET | ORAL | Status: DC

## 2014-02-17 MED ORDER — FAMOTIDINE 20 MG PO TABS
20.0000 mg | ORAL_TABLET | Freq: Two times a day (BID) | ORAL | Status: DC
Start: 2014-02-17 — End: 2014-04-06

## 2014-02-17 MED ORDER — PREDNISONE 20 MG PO TABS
60.0000 mg | ORAL_TABLET | Freq: Once | ORAL | Status: AC
  Administered 2014-02-17: 60 mg via ORAL
  Filled 2014-02-17: qty 3

## 2014-02-17 NOTE — ED Provider Notes (Signed)
CSN: [id number]     Arrival date & time 02/17/14  1114 History   First MD Initiated Contact with Patient 02/17/14 1133     Chief Complaint  Patient presents with  . Allergic Reaction     (Consider location/radiation/quality/duration/timing/severity/associated sxs/prior Treatment) Patient is a 78 y.o. male presenting with allergic reaction. The history is provided by the patient.  Allergic Reaction Presenting symptoms: rash   Severity:  Mild Prior allergic episodes:  No prior episodes Context comment:  Recent vancomycin administration Relieved by:  Nothing Worsened by:  Nothing tried Ineffective treatments:  Antihistamines   Past Medical History  Diagnosis Date  . Dyslipidemia   . Hx of adenomatous colonic polyps   . Psoriasis   . ED (erectile dysfunction)   . Smoker   . Obesity   . Cough     smokers cough or perfurmes  . Seasonal allergies   . Degeneration macular     right eye  . Hypertension     borderline  . BCE (basal cell epithelioma)   . Bruises easily     on hands   Past Surgical History  Procedure Laterality Date  . Colonoscopy  2005    [facility]  . Polypectomy    . Cataract extraction, bilateral Bilateral 2013  . Total hip arthroplasty Left 01/04/2014    Procedure: LEFT TOTAL HIP ARTHROPLASTY ANTERIOR APPROACH;  Surgeon: Mcarthur Rossetti, MD;  Location: [facility];  Service: Orthopedics;  Laterality: Left;  . Skin cancer excision Right 1993    under eye-MOHS, freeze multiple places freq  . Tonsillectomy  as child  . Total hip arthroplasty Left 02/14/2014    Procedure: Irrigation and Debridement left hip;  Surgeon: Mcarthur Rossetti, MD;  Location: [facility];  Service: Orthopedics;  Laterality: Left;   Family History  Problem Relation Age of Onset  . Heart disease Brother    History  Substance Use Topics  . Smoking status: Current Every Day Smoker -- 0.50 packs/day    Types: Cigarettes  . Smokeless tobacco: Never Used  . Alcohol Use: No     Review of Systems  Constitutional: Negative for fever.  HENT: Negative for drooling and rhinorrhea.   Eyes: Negative for pain.  Respiratory: Negative for cough and shortness of breath.   Cardiovascular: Negative for chest pain and leg swelling.  Gastrointestinal: Negative for nausea, vomiting, abdominal pain and diarrhea.  Genitourinary: Negative for dysuria and hematuria.  Musculoskeletal: Negative for gait problem and neck pain.  Skin: Positive for rash. Negative for color change.  Neurological: Negative for numbness and headaches.  Hematological: Negative for adenopathy.  Psychiatric/Behavioral: Negative for behavioral problems.  All other systems reviewed and are negative.     Allergies  Latex  Home Medications   Prior to Admission medications   Medication Sig Start Date End Date Taking? Authorizing Provider  Ascorbic Acid (VITAMIN C) 1000 MG tablet Take 1,000 mg by mouth daily.    Historical Provider, MD  aspirin EC 325 MG EC tablet Take 1 tablet (325 mg total) by mouth 2 (two) times daily after a meal. 01/05/14   Mcarthur Rossetti, MD  clobetasol cream (TEMOVATE) 1.06 % Apply 1 application topically daily. For psoriasis on hands    Historical Provider, MD  Cranberry-Vitamin C ([facility] CRANBERRY) 84-20 MG CAPS Take 1 tablet by mouth daily.    Historical Provider, MD  doxycycline (DORYX) 100 MG EC tablet Take 1 tablet (100 mg total) by mouth 2 (two) times daily. 02/15/14  Erskine [name], PA-C  HYDROcodone-acetaminophen (NORCO/VICODIN) 5-325 MG per tablet Take 1-2 tablets by mouth every 4 (four) hours as needed for moderate pain. 02/15/14   Erskine [name], PA-C  loratadine (CLARITIN) 10 MG tablet Take 10 mg by mouth daily.    Historical Provider, MD  Ranibizumab (LUCENTIS IO) Place into the right eye as directed. Every 5 to 6 weeks injections    Historical Provider, MD   BP 173/85  Pulse 113  Temp(Src) 98.9 F (37.2 C) (Oral)  Resp 22  SpO2 97% Physical Exam  Nursing  note and vitals reviewed. Constitutional: He is oriented to person, place, and time. He appears well-developed and well-nourished.  HENT:  Head: Normocephalic and atraumatic.  Right Ear: External ear normal.  Left Ear: External ear normal.  Nose: Nose normal.  Mouth/Throat: Oropharynx is clear and moist. No oropharyngeal exudate.  Eyes: Conjunctivae and EOM are normal. Pupils are equal, round, and reactive to light.  Neck: Normal range of motion. Neck supple.  Cardiovascular: Normal rate, regular rhythm, normal heart sounds and intact distal pulses.  Exam reveals no gallop and no friction rub.   No murmur heard. Pulmonary/Chest: Effort normal and breath sounds normal. No respiratory distress. He has no wheezes.  Abdominal: Soft. Bowel sounds are normal. He exhibits no distension. There is no tenderness. There is no rebound and no guarding.  Musculoskeletal: Normal range of motion. He exhibits no edema and no tenderness.  Surgical wound to left lateral hip with dressing applied. Very faint surrounding erythema.  Neurological: He is alert and oriented to person, place, and time.  Skin: Skin is warm and dry. Rash noted.  Macular erythematous rash noted most prominently on the face and upper trunk. Also seen in the lower trunk as well.  Some mild desquamation on the palms and dorsal aspect of his feet which the patient states is not significantly changed from baseline.  Psychiatric: He has a normal mood and affect. His behavior is normal.    ED Course  Procedures (including critical care time) Labs Review Labs Reviewed - No data to display  Imaging Review No results found.   EKG Interpretation None      MDM   Final diagnoses:  Allergic reaction, initial encounter    11:57 AM 78 y.o. male w hx of left hip replacement in July '15 and subsequent washout of hip several days ago who pw redness and itching. I reviewed the surgical report which notes a low suspicion for infection and  suspected likely seroma. The wound culture thus far is negative. The patient is otherwise well appearing. I reviewed the notes from his admission and it appears that he got 3 doses of IV vancomycin every 12 hours while hospitalized Thursday and Friday of this week. He notes that he started seeing some redness of his face upon discharge on Friday. He slowly developed mild increase in the redness and swelling of his face and his lower trunk over the next day. He believes it to be slowly resolving currently. He denies any nausea, vomiting, diarrhea, shortness of breath, or chest pain. He is afebrile and mildly tachycardic here on initial triage vitals. He otherwise feels well and appears well on exam. Likely "red man" syndrome from the vancomycin. Although IgE mediated anaphylaxis should be considered due to the mildly delayed presentation I feel that this is less likely given his well appearance and improving symptoms. Will give steroids, pepcid, claritin here.   12:32 PM: Will discharge home with prescription for Pepcid,  steroids, and recommend antihistamines as needed. We'll have him return for any worsening. I have discussed the diagnosis/risks/treatment options with the patient and family and believe the pt to be eligible for discharge home to follow-up with Dr. Ninfa Linden tomorrow. We also discussed returning to the ED immediately if new or worsening sx occur. We discussed the sx which are most concerning (e.g., sob, inc redness or swelling, vomiting, diarrhea) that necessitate immediate return. Medications administered to the patient during their visit and any new prescriptions provided to the patient are listed below.  Medications given during this visit Medications  loratadine (CLARITIN) tablet 10 mg (not administered)  predniSONE (DELTASONE) tablet 60 mg (60 mg Oral Given 02/17/14 1225)  famotidine (PEPCID) tablet 20 mg (20 mg Oral Given 02/17/14 1225)    Discharge Medication List as of 02/17/2014 12:38  PM    START taking these medications   Details  famotidine (PEPCID) 20 MG tablet Take 1 tablet (20 mg total) by mouth 2 (two) times daily., Starting 02/17/2014, Until Discontinued, Print    predniSONE (DELTASONE) 20 MG tablet 2 tabs po daily x 3 days, Print           Pamella Pert, MD 02/17/14 1254

## 2014-02-17 NOTE — ED Notes (Signed)
He states he underwent I & D of left hip incision by Dr. Ninfa Linden three days ago, at which time her received IV antibiotics.  He noted a red discoloration of his face and upper torso two days ago, which persists, along with some itching and edema of face.  He denies any throat tightness, nor difficulty breathing and is in no distress.

## 2014-02-17 NOTE — ED Notes (Signed)
Patient up to bathroom.  No difficulty walking, uses cane.  No SOB, or other breathing difficulties.  Facial skin reddened, neck, and a few spots on his abdomen and legs.

## 2014-02-17 NOTE — Discharge Instructions (Signed)
Drug Allergy °Allergic reactions to medicines are common. Some allergic reactions are mild. A delayed type of drug allergy that occurs 1 week or more after exposure to a medicine or vaccine is called serum sickness. A life-threatening, sudden (acute) allergic reaction that involves the whole body is called anaphylaxis. °CAUSES  °"True" drug allergies occur when there is an allergic reaction to a medicine. This is caused by overactivity of the immune system. First, the body becomes sensitized. The immune system is triggered by your first exposure to the medicine. Following this first exposure, future exposure to the same medicine may be life-threatening. °Almost any medicine can cause an allergic reaction. Common ones are: °· Penicillin. °· Sulfonamides (sulfa drugs). °· Local anesthetics. °· X-ray dyes that contain iodine. °SYMPTOMS  °Common symptoms of a minor allergic reaction are: °· Swelling around the mouth. °· An itchy red rash or hives. °· Vomiting or diarrhea. °Anaphylaxis can cause swelling of the mouth and throat. This makes it difficult to breathe and swallow. Severe reactions can be fatal within seconds, even after exposure to only a trace amount of the drug that causes the reaction. °HOME CARE INSTRUCTIONS  °· If you are unsure of what caused your reaction, keep a diary of foods and medicines used. Include the symptoms that followed. Avoid anything that causes reactions. °· You may want to follow up with an allergy specialist after the reaction has cleared in order to be tested to confirm the allergy. It is important to confirm that your reaction is an allergy, not just a side effect to the medicine. If you have a true allergy to a medicine, this may prevent that medicine and related medicines from being given to you when you are very ill. °· If you have hives or a rash: °¨ Take medicines as directed by your caregiver. °¨ You may use an over-the-counter antihistamine (diphenhydramine) as  needed. °¨ Apply cold compresses to the skin or take baths in cool water. Avoid hot baths or showers. °· If you are severely allergic: °¨ Continuous observation after a severe reaction may be needed. Hospitalization is often required. °¨ Wear a medical alert bracelet or necklace stating your allergy. °¨ You and your family must learn how to use an anaphylaxis kit or give an epinephrine injection to temporarily treat an emergency allergic reaction. If you have had a severe reaction, always carry your epinephrine injection or anaphylaxis kit with you. This can be lifesaving if you have a severe reaction. °· Do not drive or perform tasks after treatment until the medicines used to treat your reaction have worn off, or until your caregiver says it is okay. °SEEK MEDICAL CARE IF:  °· You think you had an allergic reaction. Symptoms usually start within 30 minutes after exposure. °· Symptoms are getting worse rather than better. °· You develop new symptoms. °· The symptoms that brought you to your caregiver return. °SEEK IMMEDIATE MEDICAL CARE IF:  °· You have swelling of the mouth, difficulty breathing, or wheezing. °· You have a tight feeling in your chest or throat. °· You develop hives, swelling, or itching all over your body. °· You develop severe vomiting or diarrhea. °· You feel faint or pass out. °This is an emergency. Use your epinephrine injection or anaphylaxis kit as you have been instructed. Call for emergency medical help. Even if you improve after the injection, you need to be examined at a hospital emergency department. °MAKE SURE YOU:  °· Understand these instructions. °· Will watch   your condition.  Will get help right away if you are not doing well or get worse. Document Released: 06/14/2005 Document Revised: 09/06/2011 Document Reviewed: 11/18/2010 [facility] Patient Information 2015 [place], [place]. This information is not intended to replace advice given to you by your health care provider. Make  sure you discuss any questions you have with your health care provider.  Take antihistamines such as Benadryl or Zyrtec/Claritin/Allegra as needed.

## 2014-04-04 ENCOUNTER — Encounter: Payer: Self-pay | Admitting: Family Medicine

## 2014-04-04 ENCOUNTER — Ambulatory Visit (HOSPITAL_COMMUNITY)
Admission: RE | Admit: 2014-04-04 | Discharge: 2014-04-04 | Disposition: A | Discharge status: Home / Self Care | Payer: Medicare Other | Source: Ambulatory Visit | Attending: Vascular Surgery | Admitting: Vascular Surgery

## 2014-04-04 ENCOUNTER — Ambulatory Visit (INDEPENDENT_AMBULATORY_CARE_PROVIDER_SITE_OTHER): Payer: Medicare Other | Admitting: Family Medicine

## 2014-04-04 VITALS — BP 122/70 | HR 82 | Temp 98.3°F | Ht 72.0 in | Wt 246.0 lb

## 2014-04-04 DIAGNOSIS — M79662 Pain in left lower leg: Secondary | ICD-10-CM | POA: Insufficient documentation

## 2014-04-04 DIAGNOSIS — M7989 Other specified soft tissue disorders: Secondary | ICD-10-CM

## 2014-04-04 DIAGNOSIS — I10 Essential (primary) hypertension: Secondary | ICD-10-CM

## 2014-04-04 DIAGNOSIS — Z96649 Presence of unspecified artificial hip joint: Secondary | ICD-10-CM

## 2014-04-04 DIAGNOSIS — R21 Rash and other nonspecific skin eruption: Secondary | ICD-10-CM

## 2014-04-04 DIAGNOSIS — Z9660 Presence of unspecified orthopedic joint implant: Secondary | ICD-10-CM

## 2014-04-04 DIAGNOSIS — L409 Psoriasis, unspecified: Secondary | ICD-10-CM

## 2014-04-04 LAB — CBC WITH DIFFERENTIAL/PLATELET
BASOS ABS: 0 10*3/uL (ref 0.0–0.1)
BASOS PCT: 0 % (ref 0–1)
Eosinophils Absolute: 0.8 10*3/uL — ABNORMAL HIGH (ref 0.0–0.7)
Eosinophils Relative: 11 % — ABNORMAL HIGH (ref 0–5)
HEMATOCRIT: 45.9 % (ref 39.0–52.0)
Hemoglobin: 16.1 g/dL (ref 13.0–17.0)
LYMPHS PCT: 29 % (ref 12–46)
Lymphs Abs: 2.1 10*3/uL (ref 0.7–4.0)
MCH: 31.5 pg (ref 26.0–34.0)
MCHC: 35.1 g/dL (ref 30.0–36.0)
MCV: 89.8 fL (ref 78.0–100.0)
MONO ABS: 0.5 10*3/uL (ref 0.1–1.0)
Monocytes Relative: 7 % (ref 3–12)
NEUTROS PCT: 53 % (ref 43–77)
Neutro Abs: 3.8 10*3/uL (ref 1.7–7.7)
PLATELETS: 310 10*3/uL (ref 150–400)
RBC: 5.11 MIL/uL (ref 4.22–5.81)
RDW: 14 % (ref 11.5–15.5)
WBC: 7.1 10*3/uL (ref 4.0–10.5)

## 2014-04-04 LAB — SEDIMENTATION RATE: SED RATE: 4 mm/h (ref 0–16)

## 2014-04-04 LAB — D-DIMER, QUANTITATIVE (NOT AT ARMC): D-Dimer, Quant: 5.77 ug/mL-FEU — ABNORMAL HIGH (ref 0.00–0.48)

## 2014-04-04 MED ORDER — METHYLPREDNISOLONE ACETATE 80 MG/ML IJ SUSP
80.0000 mg | Freq: Once | INTRAMUSCULAR | Status: AC
  Administered 2014-04-04: 80 mg via INTRAMUSCULAR

## 2014-04-04 MED ORDER — CEFTRIAXONE SODIUM 1 G IJ SOLR
1.0000 g | Freq: Once | INTRAMUSCULAR | Status: AC
  Administered 2014-04-04: 1 g via INTRAMUSCULAR

## 2014-04-04 NOTE — Progress Notes (Signed)
Chief Complaint  Patient presents with  . Edema    LEFT LEG HAD HIP REPLACEMENT 01/04/14 FOLLOW UP SURGERY FOR INFECTION   . Skin Problem    LEFT LEG   He has a history of psoriasis, which had been under fair control with clobetasol cream until about 8-9 days ago.  He then developed flare of psoriasis, worse on his hands.  He then developed left leg swelling, and rash spreading up thighs, and to arms and into armpits.  When he developed left leg swelling (earlier in the week), he saw Dr. Ninfa Linden who recommended compression stocking.  He wore that only for a day, but he states "it made it worse"--increased redness and swelling at the ankle.  The ankle started weeping yesterday, draining serosanguinous drainage.  He denies any pain in the leg, just significant swelling and drainage.  Hands have gotten a lot worse in the last 3-4 days, with cracking--bleeding some today.  Psoriasis is also flaring on his legs  He has a rash under his armpits, both thighs, lower legs--it is very itchy.  He denies any change in products, soaps.  He uses Dial antibacterial soap only at scar at left hip, not elsewhere--uses Dove elsewhere.  He thinks Dial gives him rashes when used all over..   He had L THR in 12/2013, and re-admitted 8/20 with post-op wound infection, where he had irrigation and debridement of the left hip. He developed red man syndrome from vancomycin.  He was treated with prednisone "but it wasn't enough to clear it up". He was supposed to be treated with doxycycline for a month. He admits that he stopped taking it when he had Red Man syndrome, and only restarted the doxycycline 5 days ago.  He stopped taking it yesterday. (He was advised by Dr. Ninfa Linden on Tuesday to keep taking the antibiotic).  He states that ankle swelling in his left ankle has persisted since his surgery.  He had gotten a little more swollen, but the worsening of swelling started after wearing compression stocking recommended by Dr.  Ninfa Linden  Denies any calf pain, just itching.  Has been keeping the leg elevated. Swelling is much less in the mornings, worse later in the day.  Hypertension.  He reports that he stopped taking his medication due to cough.  Review of the chart shows that he was on amlodipine, not on ACEI.    Past Medical History  Diagnosis Date  . Dyslipidemia   . Hx of adenomatous colonic polyps   . Psoriasis   . ED (erectile dysfunction)   . Smoker   . Obesity   . Cough     smokers cough or perfurmes  . Seasonal allergies   . Degeneration macular     right eye  . Hypertension     borderline  . BCE (basal cell epithelioma)   . Bruises easily     on hands   Past Surgical History  Procedure Laterality Date  . Colonoscopy  2005    Gessner  . Polypectomy    . Cataract extraction, bilateral Bilateral 2013  . Total hip arthroplasty Left 01/04/2014    Procedure: LEFT TOTAL HIP ARTHROPLASTY ANTERIOR APPROACH;  Surgeon: Mcarthur Rossetti, MD;  Location: [facility];  Service: Orthopedics;  Laterality: Left;  . Skin cancer excision Right 1993    under eye-MOHS, freeze multiple places freq  . Tonsillectomy  as child  . Total hip arthroplasty Left 02/14/2014    Procedure: Irrigation and Debridement left hip;  Surgeon: Mcarthur Rossetti, MD;  Location: [facility];  Service: Orthopedics;  Laterality: Left;   History   Social History  . Marital Status: Married    Spouse Name: N/A    Number of Children: N/A  . Years of Education: N/A   Occupational History  . Not on file.   Social History Main Topics  . Smoking status: Current Every Day Smoker -- 0.50 packs/day    Types: Cigarettes  . Smokeless tobacco: Never Used  . Alcohol Use: No  . Drug Use: No  . Sexual Activity: Yes   Other Topics Concern  . Not on file   Social History Narrative  . No narrative on file   Outpatient Encounter Prescriptions as of 04/04/2014  Medication Sig Note  . Ascorbic Acid (VITAMIN C) 1000 MG tablet Take  1,000 mg by mouth daily.   Marland Kitchen aspirin EC 325 MG EC tablet Take 1 tablet (325 mg total) by mouth 2 (two) times daily after a meal. 02/12/2014: Stopped for procedure  . clobetasol cream (TEMOVATE) 4.01 % Apply 1 application topically daily. For psoriasis on hands   . Cranberry-Vitamin C ([employer] CRANBERRY) 84-20 MG CAPS Take 1 tablet by mouth daily.   Marland Kitchen loratadine (CLARITIN) 10 MG tablet Take 10 mg by mouth daily.   . Ranibizumab (LUCENTIS IO) Place into the right eye as directed. Every 5 to 6 weeks injections 02/12/2014: last dose on 01/30/14  . doxycycline (DORYX) 100 MG EC tablet Take 1 tablet (100 mg total) by mouth 2 (two) times daily.   . famotidine (PEPCID) 20 MG tablet Take 1 tablet (20 mg total) by mouth 2 (two) times daily.   Marland Kitchen HYDROcodone-acetaminophen (NORCO/VICODIN) 5-325 MG per tablet Take 1-2 tablets by mouth every 4 (four) hours as needed for moderate pain.   . predniSONE (DELTASONE) 20 MG tablet 2 tabs po daily x 3 days   . [EXPIRED] cefTRIAXone (ROCEPHIN) injection 1 g    . [EXPIRED] methylPREDNISolone acetate (DEPO-MEDROL) injection 80 mg     Allergies  Allergen Reactions  . Latex Other (See Comments)    "possibly allergic- dry, itching, rash"   ROS: Denies any fever, chills.  Denies hip pain, calf/leg pain.  He denies chest pain, shortness of breath. He denies URI symptoms, cough.  No nausea, vomiting, or other complaints, except as noted in HPI  PHYSICAL EXAM: BP 122/70  Pulse 82  Temp(Src) 98.3 F (36.8 C) (Oral)  Ht 6' (1.829 m)  Wt 246 lb (111.585 kg)  BMI 33.36 kg/m2 Pleasant, elderly male in no acute distress Heart: regular rate and rhythm Lungs: clear Abdomen: soft, nontender Extremities: Left leg is very firm and indurated throughout the entire calf, and mildly into lower thigh. Weeping of serosanguinous fluid anteriorly at ankle from multiple areas. 2+ pulses noted in the LLE Skin:  There is erythema noted at the upper calf, no streaking down the leg Diffuse  rash, with some areas c/w guttate psoriasis, other areas had more generalized, reticular pattern to rash.  In axilla it appeared to have some central scaling.  ASSESSMENT/PLAN:  Left leg swelling - r/o DVT.  DDx includes allergic reaction to the compression hose; psoriasis flare with secondary infection, DVT - Plan: CBC with Differential, Sedimentation Rate, D-dimer, quantitative, methylPREDNISolone acetate (DEPO-MEDROL) injection 80 mg  Pain of left lower leg - Plan: Lower Extremity Venous Duplex Left  Psoriasis  Rash and nonspecific skin eruption - Plan: cefTRIAXone (ROCEPHIN) injection 1 g  Status post THR (total hip replacement)  Essential hypertension - he has been off his meds, and BP was normal today.  recommended periodically monitoring.   ?if stocking had any latex in it that could be contributing to swelling and rash  CBC, ESR, d-dimer STAT  U/S to r/o DVT--today  Rocephin 1gram given, and pt advised to restart his doxycycline today. Depo medrol 59m given.  F/u by phone with lab and u/s results.

## 2014-04-04 NOTE — Patient Instructions (Signed)
We are sending you for an ultrasound to evaluate for blood clot in your left leg. We have given you injection of antibiotic and steroids (to treat possible infection, and allergic reaction, as well as psoriasis flare). We are doing some stat labs.  If your labs are abnormal, or if you have a blood clot, we will be sending you to the hospital/emergency room.  We will be in touch with your lab results later this afternoon/evening.

## 2014-04-04 NOTE — Progress Notes (Signed)
VASCULAR LAB PRELIMINARY  PRELIMINARY  PRELIMINARY  PRELIMINARY  Left lower extremity venous duplex completed.    Preliminary report:  Left:  No evidence of DVT, superficial thrombosis, or Baker's cyst.  Alex Edwards, RVS 04/04/2014, 5:27 PM

## 2014-04-06 ENCOUNTER — Emergency Department (HOSPITAL_COMMUNITY)
Admission: EM | Admit: 2014-04-06 | Discharge: 2014-04-06 | Disposition: A | Discharge status: Home / Self Care | Payer: Medicare Other | Attending: Emergency Medicine | Admitting: Emergency Medicine

## 2014-04-06 ENCOUNTER — Encounter (HOSPITAL_COMMUNITY): Payer: Self-pay | Admitting: Emergency Medicine

## 2014-04-06 DIAGNOSIS — Y939 Activity, unspecified: Secondary | ICD-10-CM | POA: Diagnosis not present

## 2014-04-06 DIAGNOSIS — Z96649 Presence of unspecified artificial hip joint: Secondary | ICD-10-CM | POA: Diagnosis not present

## 2014-04-06 DIAGNOSIS — Z7952 Long term (current) use of systemic steroids: Secondary | ICD-10-CM | POA: Diagnosis not present

## 2014-04-06 DIAGNOSIS — Z86010 Personal history of colonic polyps: Secondary | ICD-10-CM | POA: Diagnosis not present

## 2014-04-06 DIAGNOSIS — L409 Psoriasis, unspecified: Secondary | ICD-10-CM | POA: Insufficient documentation

## 2014-04-06 DIAGNOSIS — Z79899 Other long term (current) drug therapy: Secondary | ICD-10-CM | POA: Insufficient documentation

## 2014-04-06 DIAGNOSIS — Z87448 Personal history of other diseases of urinary system: Secondary | ICD-10-CM | POA: Diagnosis not present

## 2014-04-06 DIAGNOSIS — Z720 Tobacco use: Secondary | ICD-10-CM | POA: Diagnosis not present

## 2014-04-06 DIAGNOSIS — Z91040 Latex allergy status: Secondary | ICD-10-CM | POA: Diagnosis not present

## 2014-04-06 DIAGNOSIS — Z792 Long term (current) use of antibiotics: Secondary | ICD-10-CM | POA: Insufficient documentation

## 2014-04-06 DIAGNOSIS — Z8669 Personal history of other diseases of the nervous system and sense organs: Secondary | ICD-10-CM | POA: Diagnosis not present

## 2014-04-06 DIAGNOSIS — I10 Essential (primary) hypertension: Secondary | ICD-10-CM | POA: Diagnosis not present

## 2014-04-06 DIAGNOSIS — E669 Obesity, unspecified: Secondary | ICD-10-CM | POA: Insufficient documentation

## 2014-04-06 DIAGNOSIS — X58XXXA Exposure to other specified factors, initial encounter: Secondary | ICD-10-CM | POA: Insufficient documentation

## 2014-04-06 DIAGNOSIS — L509 Urticaria, unspecified: Secondary | ICD-10-CM | POA: Diagnosis not present

## 2014-04-06 DIAGNOSIS — Y929 Unspecified place or not applicable: Secondary | ICD-10-CM | POA: Insufficient documentation

## 2014-04-06 DIAGNOSIS — R21 Rash and other nonspecific skin eruption: Secondary | ICD-10-CM | POA: Diagnosis present

## 2014-04-06 DIAGNOSIS — Z7982 Long term (current) use of aspirin: Secondary | ICD-10-CM | POA: Insufficient documentation

## 2014-04-06 DIAGNOSIS — T7840XA Allergy, unspecified, initial encounter: Secondary | ICD-10-CM

## 2014-04-06 DIAGNOSIS — Z853 Personal history of malignant neoplasm of breast: Secondary | ICD-10-CM | POA: Diagnosis not present

## 2014-04-06 LAB — BASIC METABOLIC PANEL
Anion gap: 14 (ref 5–15)
BUN: 13 mg/dL (ref 6–23)
CALCIUM: 8.8 mg/dL (ref 8.4–10.5)
CO2: 23 mEq/L (ref 19–32)
Chloride: 100 mEq/L (ref 96–112)
Creatinine, Ser: 0.99 mg/dL (ref 0.50–1.35)
GFR calc Af Amer: 90 mL/min — ABNORMAL LOW (ref >90)
GFR, EST NON AFRICAN AMERICAN: 78 mL/min — AB (ref >90)
Glucose, Bld: 97 mg/dL (ref 70–99)
Potassium: 4.2 mEq/L (ref 3.7–5.3)
SODIUM: 137 meq/L (ref 137–147)

## 2014-04-06 LAB — CBC WITH DIFFERENTIAL/PLATELET
Basophils Absolute: 0 10*3/uL (ref 0.0–0.1)
Basophils Relative: 0 % (ref 0–1)
EOS ABS: 1.6 10*3/uL — AB (ref 0.0–0.7)
EOS PCT: 18 % — AB (ref 0–5)
HCT: 45.9 % (ref 39.0–52.0)
Hemoglobin: 15.6 g/dL (ref 13.0–17.0)
Lymphocytes Relative: 22 % (ref 12–46)
Lymphs Abs: 1.9 10*3/uL (ref 0.7–4.0)
MCH: 31.3 pg (ref 26.0–34.0)
MCHC: 34 g/dL (ref 30.0–36.0)
MCV: 92.2 fL (ref 78.0–100.0)
Monocytes Absolute: 0.7 10*3/uL (ref 0.1–1.0)
Monocytes Relative: 8 % (ref 3–12)
Neutro Abs: 4.6 10*3/uL (ref 1.7–7.7)
Neutrophils Relative %: 52 % (ref 43–77)
PLATELETS: 271 10*3/uL (ref 150–400)
RBC: 4.98 MIL/uL (ref 4.22–5.81)
RDW: 13.4 % (ref 11.5–15.5)
WBC: 8.7 10*3/uL (ref 4.0–10.5)

## 2014-04-06 MED ORDER — DIPHENHYDRAMINE HCL 50 MG/ML IJ SOLN
25.0000 mg | Freq: Once | INTRAMUSCULAR | Status: AC
  Administered 2014-04-06: 25 mg via INTRAVENOUS
  Filled 2014-04-06: qty 1

## 2014-04-06 MED ORDER — DIPHENHYDRAMINE HCL 25 MG PO TABS: 25.0000 mg | ORAL_TABLET | Freq: Four times a day (QID) | ORAL | Status: DC

## 2014-04-06 MED ORDER — PREDNISONE 10 MG PO TABS: 60.0000 mg | ORAL_TABLET | Freq: Every day | ORAL | Status: DC

## 2014-04-06 MED ORDER — FAMOTIDINE 40 MG PO TABS: 40.0000 mg | ORAL_TABLET | Freq: Two times a day (BID) | ORAL | Status: DC

## 2014-04-06 MED ORDER — FAMOTIDINE IN NACL 20-0.9 MG/50ML-% IV SOLN
20.0000 mg | Freq: Once | INTRAVENOUS | Status: AC
  Administered 2014-04-06: 20 mg via INTRAVENOUS
  Filled 2014-04-06: qty 50

## 2014-04-06 MED ORDER — LORATADINE 10 MG PO TABS: 10.0000 mg | ORAL_TABLET | Freq: Every day | ORAL | Status: DC

## 2014-04-06 MED ORDER — METHYLPREDNISOLONE SODIUM SUCC 125 MG IJ SOLR
125.0000 mg | Freq: Once | INTRAMUSCULAR | Status: AC
  Administered 2014-04-06: 125 mg via INTRAVENOUS
  Filled 2014-04-06: qty 2

## 2014-04-06 NOTE — Discharge Instructions (Signed)
Hives Hives are itchy, red, swollen areas of the skin. They can vary in size and location on your body. Hives can come and go for hours or several days (acute hives) or for several weeks (chronic hives). Hives do not spread from person to person (noncontagious). They may get worse with scratching, exercise, and emotional stress. CAUSES   Allergic reaction to food, additives, or drugs.  Infections, including the common cold.  Illness, such as vasculitis, lupus, or thyroid disease.  Exposure to sunlight, heat, or cold.  Exercise.  Stress.  Contact with chemicals. SYMPTOMS   Red or white swollen patches on the skin. The patches may change size, shape, and location quickly and repeatedly.  Itching.  Swelling of the hands, feet, and face. This may occur if hives develop deeper in the skin. DIAGNOSIS  Your caregiver can usually tell what is wrong by performing a physical exam. Skin or blood tests may also be done to determine the cause of your hives. In some cases, the cause cannot be determined. TREATMENT  Mild cases usually get better with medicines such as antihistamines. Severe cases may require an emergency epinephrine injection. If the cause of your hives is known, treatment includes avoiding that trigger.  HOME CARE INSTRUCTIONS   Avoid causes that trigger your hives.  Take antihistamines as directed by your caregiver to reduce the severity of your hives. Non-sedating or low-sedating antihistamines are usually recommended. Do not drive while taking an antihistamine.  Take any other medicines prescribed for itching as directed by your caregiver.  Wear loose-fitting clothing.  Keep all follow-up appointments as directed by your caregiver. SEEK MEDICAL CARE IF:   You have persistent or severe itching that is not relieved with medicine.  You have painful or swollen joints. SEEK IMMEDIATE MEDICAL CARE IF:   You have a fever.  Your tongue or lips are swollen.  You have  trouble breathing or swallowing.  You feel tightness in the throat or chest.  You have abdominal pain. These problems may be the first sign of a life-threatening allergic reaction. Call your local emergency services (911 in [place] MAKE SURE YOU:   Understand these instructions.  Will watch your condition.  Will get help right away if you are not doing well or get worse. Document Released: 06/14/2005 Document Revised: 06/19/2013 Document Reviewed: 09/07/2011 Exit[employer] Patient Information 2015 [employer]. This information is not intended to replace advice given to you by your health care provider. Make sure you discuss any questions you have with your health care provider.  

## 2014-04-06 NOTE — ED Notes (Signed)
Pt reports previous prednisone cleared up facial swelling and redness, but BLE and BUE rash/blisters are increasing.  Pt reports doppler performed on LLE x 2 days ago and resulted negative.

## 2014-04-06 NOTE — ED Provider Notes (Signed)
CSN: [id number]     Arrival date & time 04/06/14  1135 History   First MD Initiated Contact with Patient 04/06/14 1257     Chief Complaint  Patient presents with  . Rash     HPI Patient reports ongoing diffuse rash since September when he went for a washout of his recent left hip replacement.  He states that it was initially thought to be "red man syndrome" secondary to the vancomycin was given.  He presents the emergency department today because of ongoing diffuse rash involving both of his upper and lower extremities as well as his abdomen and chest.  He states that the itching.  He states it seems to improve with prednisone but currently he is off prednisone.  He was scheduled to see a dermatologist last week but canceled his appointment due to some increasing swelling of his left lower extremity which is the side of his hip replacement.  He states he has diffuse itching and redness of his left leg as well.  No fevers or chills.  He was seen and evaluated in the primary care office and was prescribed antibiotics and was ordered of left lower extremity duplex to evaluate for DVT.  This was completed 2 days ago and his duplex was negative for deep vein thrombosis.  He states he presents mainly today because of diffuse itching that does not seem to responding to standard Benadryl.  He has no chest pain or shortness of breath.   Past Medical History  Diagnosis Date  . Dyslipidemia   . Hx of adenomatous colonic polyps   . Psoriasis   . ED (erectile dysfunction)   . Smoker   . Obesity   . Cough     smokers cough or perfurmes  . Seasonal allergies   . Degeneration macular     right eye  . Hypertension     borderline  . BCE (basal cell epithelioma)   . Bruises easily     on hands   Past Surgical History  Procedure Laterality Date  . Colonoscopy  2005    Gessner  . Polypectomy    . Cataract extraction, bilateral Bilateral 2013  . Total hip arthroplasty Left 01/04/2014    Procedure:  LEFT TOTAL HIP ARTHROPLASTY ANTERIOR APPROACH;  Surgeon: Mcarthur Rossetti, MD;  Location: [facility];  Service: Orthopedics;  Laterality: Left;  . Skin cancer excision Right 1993    under eye-MOHS, freeze multiple places freq  . Tonsillectomy  as child  . Total hip arthroplasty Left 02/14/2014    Procedure: Irrigation and Debridement left hip;  Surgeon: Mcarthur Rossetti, MD;  Location: [facility];  Service: Orthopedics;  Laterality: Left;   Family History  Problem Relation Age of Onset  . Heart disease Brother    History  Substance Use Topics  . Smoking status: Current Every Day Smoker -- 0.50 packs/day    Types: Cigarettes  . Smokeless tobacco: Never Used  . Alcohol Use: No    Review of Systems  All other systems reviewed and are negative.     Allergies  Vancomycin and Latex  Home Medications   Prior to Admission medications   Medication Sig Start Date End Date Taking? Authorizing Provider  Ascorbic Acid (VITAMIN C) 1000 MG tablet Take 1,000 mg by mouth daily.   Yes Historical Provider, MD  aspirin EC 325 MG tablet Take 325 mg by mouth 2 (two) times daily.   Yes Historical Provider, MD  clobetasol cream (TEMOVATE) 0.05 %  Apply 1 application topically daily. For psoriasis on hands   Yes Historical Provider, MD  Cranberry-Vitamin C ([employer] CRANBERRY) 84-20 MG CAPS Take 2 tablets by mouth daily.    Yes Historical Provider, MD  diphenhydrAMINE (SOMINEX) 25 MG tablet Take 50 mg by mouth once as needed for sleep.   Yes Historical Provider, MD  doxycycline (DORYX) 100 MG EC tablet Take 100 mg by mouth 2 (two) times daily.   Yes Historical Provider, MD  HYDROcodone-acetaminophen (NORCO/VICODIN) 5-325 MG per tablet Take 1 tablet by mouth every 4 (four) hours as needed for moderate pain.   Yes Historical Provider, MD  loratadine (CLARITIN) 10 MG tablet Take 10 mg by mouth daily.   Yes Historical Provider, MD  PRESCRIPTION MEDICATION Inject 1 each into the skin once. Prednisone shot.    Yes Historical Provider, MD  PRESCRIPTION MEDICATION Inject 1 each into the skin once. Antibiotic Injection.   Yes Historical Provider, MD  Ranibizumab (LUCENTIS IO) Place into the right eye as directed. Every 5 to 6 weeks injections   Yes Historical Provider, MD  Tetrahydrozoline HCl (VISINE OP) Apply 1-2 drops to eye every morning.   Yes Historical Provider, MD   BP 158/91  Pulse 97  Temp(Src) 98.2 F (36.8 C)  Resp 18  SpO2 100% Physical Exam  Nursing note and vitals reviewed. Constitutional: He is oriented to person, place, and time. He appears well-developed and well-nourished.  HENT:  Head: Normocephalic and atraumatic.  Eyes: EOM are normal.  Neck: Normal range of motion.  Cardiovascular: Normal rate, regular rhythm, normal heart sounds and intact distal pulses.   Pulmonary/Chest: Effort normal and breath sounds normal. No respiratory distress.  Abdominal: Soft. He exhibits no distension. There is no tenderness.  Musculoskeletal: Normal range of motion.  Full range of motion bilateral hips  Neurological: He is alert and oriented to person, place, and time.  Skin:  Diffuse hives and urticaria.  No significant area to suggest cellulitis.  Psychiatric: He has a normal mood and affect. Judgment normal.    ED Course  Procedures (including critical care time)  VASCULAR LAB  PRELIMINARY PRELIMINARY PRELIMINARY PRELIMINARY  Left lower extremity venous duplex completed.  Preliminary report: Left: No evidence of DVT, superficial thrombosis, or Baker's cyst.  SLAUGHTER, [place], RVS  04/04/2014, 5:27 PM   Labs Review Labs Reviewed  CBC WITH DIFFERENTIAL - Abnormal; Notable for the following:    Eosinophils Relative 18 (*)    Eosinophils Absolute 1.6 (*)    All other components within normal limits  BASIC METABOLIC PANEL - Abnormal; Notable for the following:    GFR calc non Af Amer 78 (*)    GFR calc Af Amer 90 (*)    All other components within normal limits  CULTURE,  BLOOD (ROUTINE X 2)  CULTURE, BLOOD (ROUTINE X 2)    Imaging Review No results found.   EKG Interpretation None      MDM   Final diagnoses:  Allergic reaction, initial encounter   Patient feels much better he is to be treated as allergic reaction and hives.  I'm not convinced that this demonstrates infection in his left lower extremity.  DVT was not present on vascular study 2 days ago.  No indication for repeat duplex today.  Patient will continue his antibiotics and continue following up with his primary care physician.  I will prescribe the patient prednisone, and Benadryl, Pepcid, Claritin.    Hoy Morn, MD 04/07/14 1336

## 2014-04-06 NOTE — ED Notes (Signed)
Initial contact-went for hip replacement 7/10. In August MD thought patient has MRSA. Went in for washout of hip and was given Vancomycin IV. Diagnosed with Red-Man Syndrome in September. Given Prednisone taper but says there was no alleviation of symptoms. Saw MD Blackmon and was given compression stockings and Doxycycline. Still taking Doxycycline and 325 mg Aspirin per day. Seen recently seen Thursday and received an "injection of steroids and antibiotics." Had left lower extremity US done-negative for blood clot. Left leg noticeably swollen with generalized rash over entire body. Patient also has Psoriasis and is currently having flare-up. Reports left lower extremity has been "weeping." Took two Benadryl around 0900. Denies fevers, SOB, chest pain, dizziness. No other complaints/concerns.

## 2014-04-09 ENCOUNTER — Encounter: Payer: Self-pay | Admitting: Family Medicine

## 2014-04-09 ENCOUNTER — Ambulatory Visit (INDEPENDENT_AMBULATORY_CARE_PROVIDER_SITE_OTHER): Payer: Medicare Other | Admitting: Family Medicine

## 2014-04-09 VITALS — BP 150/90 | HR 84 | Ht 73.0 in | Wt 251.0 lb

## 2014-04-09 DIAGNOSIS — Z96649 Presence of unspecified artificial hip joint: Secondary | ICD-10-CM

## 2014-04-09 DIAGNOSIS — Z9660 Presence of unspecified orthopedic joint implant: Secondary | ICD-10-CM

## 2014-04-09 DIAGNOSIS — L279 Dermatitis due to unspecified substance taken internally: Secondary | ICD-10-CM

## 2014-04-09 DIAGNOSIS — B999 Unspecified infectious disease: Secondary | ICD-10-CM

## 2014-04-09 DIAGNOSIS — L270 Generalized skin eruption due to drugs and medicaments taken internally: Secondary | ICD-10-CM

## 2014-04-09 DIAGNOSIS — L409 Psoriasis, unspecified: Secondary | ICD-10-CM

## 2014-04-09 DIAGNOSIS — IMO0001 Reserved for inherently not codable concepts without codable children: Secondary | ICD-10-CM

## 2014-04-09 DIAGNOSIS — T814XXS Infection following a procedure, sequela: Secondary | ICD-10-CM

## 2014-04-09 NOTE — Progress Notes (Signed)
   Subjective:    Patient ID: Alex Edwards, male    DOB: [birth year], 78 y.o.   MRN: [id number]  HPI He is here for followup visit after recent hospital visit for treatment of rash. He was placed on a Dosepak of prednisone. He was also to continue to take his doxycycline for treatment of postoperative skin infection. He states that now he is roughly 60% better. That the rash causes his psoriasis to a flareup.   Review of Systems     Objective:   Physical Exam Exam of the left leg does show a distal dryness and slight erythema. There is also some erythema at the wound site on the left hip. It is not warm or tender.       Assessment & Plan:  Psoriasis  Red man syndrome  Status post THR (total hip replacement)  Left hip postoperative wound infection, sequela  He is to continue on his doxycycline as previously recommended. He will finish his prednisone taper. She continues to have difficulty, further dermatologic evaluation may be necessary.

## 2014-04-12 LAB — CULTURE, BLOOD (ROUTINE X 2)
Culture: NO GROWTH
Culture: NO GROWTH

## 2014-04-23 ENCOUNTER — Other Ambulatory Visit: Payer: Medicare Other

## 2014-04-23 ENCOUNTER — Encounter: Payer: Self-pay | Admitting: Family Medicine

## 2014-04-23 ENCOUNTER — Ambulatory Visit (INDEPENDENT_AMBULATORY_CARE_PROVIDER_SITE_OTHER): Payer: Medicare Other | Admitting: Family Medicine

## 2014-04-23 VITALS — BP 130/70 | HR 85 | Wt 244.0 lb

## 2014-04-23 DIAGNOSIS — L723 Sebaceous cyst: Secondary | ICD-10-CM

## 2014-04-23 DIAGNOSIS — I10 Essential (primary) hypertension: Secondary | ICD-10-CM

## 2014-04-23 DIAGNOSIS — L570 Actinic keratosis: Secondary | ICD-10-CM

## 2014-04-23 DIAGNOSIS — L409 Psoriasis, unspecified: Secondary | ICD-10-CM

## 2014-04-23 DIAGNOSIS — Z23 Encounter for immunization: Secondary | ICD-10-CM

## 2014-04-23 NOTE — Progress Notes (Signed)
   Subjective:    Patient ID: Alex Edwards, male    DOB: [birth year], 78 y.o.   MRN: [id number]  HPI He is here for consult concerning multiple issues. He is having difficulty with dry cough and leg cramps and thinks this could be related to amlodipine. He did stop the medication and his symptoms went away. He then started it again and is having no difficulty. He will keep me informed concerning this. Apparently he has had difficulty in the past with other medications causing the same problem. He also has a cystic lesion on his upper back that he thinks will eventually need to be removed. He also is having some facial lesions that he has concerns over. He has a history of psoriasis and recently had a re-exacerbation of his symptoms after suffering from red man syndrome. He states that multiple lotions causes difficulty with his skin although he has been able to use clobetasol and coordinate.   Review of Systems     Objective:   Physical Exam Alert and in no distress. Several erythematous dry scaly lesions are noted on his face. His arms and hands do show erythema and drying. Exam of his back does show a 3 cm round smooth movable lesion in the upper mid back area.       Assessment & Plan:  Essential hypertension  Need for prophylactic vaccination against Streptococcus pneumoniae (pneumococcus) - Plan: Pneumococcal conjugate vaccine 13-valent  Need for prophylactic vaccination and inoculation against influenza - Plan: Flu vaccine HIGH DOSE PF (Fluzone Tri High dose)  Actinic keratosis  Psoriasis  Sebaceous cyst  he will follow-up with his dermatologist concerning his psoriasis as well as actinic keratosis. He will return here for cyst excision when it starts to give him trouble specifically get infected. He will let me know how he is doing using the amlodipine. He is to call back with the name of the medications that also caused the dry cough and leg cramps.

## 2014-06-11 ENCOUNTER — Other Ambulatory Visit: Payer: Self-pay | Admitting: Family Medicine

## 2014-07-02 DIAGNOSIS — H35051 Retinal neovascularization, unspecified, right eye: Secondary | ICD-10-CM | POA: Diagnosis not present

## 2014-07-02 DIAGNOSIS — H3532 Exudative age-related macular degeneration: Secondary | ICD-10-CM | POA: Diagnosis not present

## 2014-07-08 DIAGNOSIS — M1612 Unilateral primary osteoarthritis, left hip: Secondary | ICD-10-CM | POA: Diagnosis not present

## 2014-07-23 DIAGNOSIS — H35052 Retinal neovascularization, unspecified, left eye: Secondary | ICD-10-CM | POA: Diagnosis not present

## 2014-07-23 DIAGNOSIS — H3532 Exudative age-related macular degeneration: Secondary | ICD-10-CM | POA: Diagnosis not present

## 2014-08-05 DIAGNOSIS — H3532 Exudative age-related macular degeneration: Secondary | ICD-10-CM | POA: Diagnosis not present

## 2014-08-05 DIAGNOSIS — H35051 Retinal neovascularization, unspecified, right eye: Secondary | ICD-10-CM | POA: Diagnosis not present

## 2014-08-27 DIAGNOSIS — H35051 Retinal neovascularization, unspecified, right eye: Secondary | ICD-10-CM | POA: Diagnosis not present

## 2014-08-27 DIAGNOSIS — H3532 Exudative age-related macular degeneration: Secondary | ICD-10-CM | POA: Diagnosis not present

## 2014-09-30 ENCOUNTER — Encounter: Payer: Self-pay | Admitting: Gastroenterology

## 2014-10-01 DIAGNOSIS — H3532 Exudative age-related macular degeneration: Secondary | ICD-10-CM | POA: Diagnosis not present

## 2014-10-01 DIAGNOSIS — H3531 Nonexudative age-related macular degeneration: Secondary | ICD-10-CM | POA: Diagnosis not present

## 2014-10-01 DIAGNOSIS — H35051 Retinal neovascularization, unspecified, right eye: Secondary | ICD-10-CM | POA: Diagnosis not present

## 2014-10-28 ENCOUNTER — Encounter: Payer: Self-pay | Admitting: Family Medicine

## 2014-10-28 ENCOUNTER — Ambulatory Visit (INDEPENDENT_AMBULATORY_CARE_PROVIDER_SITE_OTHER): Payer: Medicare Other | Admitting: Family Medicine

## 2014-10-28 VITALS — BP 160/84 | HR 80 | Temp 98.4°F | Ht 73.0 in | Wt 254.8 lb

## 2014-10-28 DIAGNOSIS — L089 Local infection of the skin and subcutaneous tissue, unspecified: Secondary | ICD-10-CM

## 2014-10-28 DIAGNOSIS — L723 Sebaceous cyst: Secondary | ICD-10-CM

## 2014-10-28 DIAGNOSIS — I10 Essential (primary) hypertension: Secondary | ICD-10-CM

## 2014-10-28 MED ORDER — CEPHALEXIN 500 MG PO CAPS: 500.0000 mg | ORAL_CAPSULE | Freq: Three times a day (TID) | ORAL | Status: DC

## 2014-10-28 NOTE — Progress Notes (Addendum)
Chief Complaint  Patient presents with  . Cyst    has had cyst left shoulder x 10 years. Thinks it has been infected x 1 week.    Over the last week he has had soreness in the area of his cyst.  He has noticed that it has gotten "hard", whereas it had been soft.  He also notices some redness starting over the area.  He previously had cyst excision lower in his back without recurrence.  No fevers or chills.  Hypertension:  He has leg swelling and edema from amlodipine, and when it flares, he stops using it.  He only restarted it about 3 days ago.  He has also had other medication he didn't tolerate.  He hasn't been checking BP's elsewhere (has machine at home).  PMH, PSH, SH reviewed.  Current Outpatient Prescriptions on File Prior to Visit  Medication Sig Dispense Refill  . amLODipine (NORVASC) 5 MG tablet TAKE 1 TABLET ONCE DAILY. 30 tablet 3  . Ascorbic Acid (VITAMIN C) 1000 MG tablet Take 1,000 mg by mouth daily.    . Cranberry-Vitamin C ([facility] CRANBERRY) 84-20 MG CAPS Take 2 tablets by mouth daily.     Marland Kitchen loratadine (CLARITIN) 10 MG tablet Take 1 tablet (10 mg total) by mouth daily. 10 tablet 0  . Ranibizumab (LUCENTIS IO) Place into the right eye as directed. Every 5 to 6 weeks injections    . Tetrahydrozoline HCl (VISINE OP) Apply 1-2 drops to eye every morning.    . clobetasol cream (TEMOVATE) 1.61 % Apply 1 application topically daily. For psoriasis on hands     No current facility-administered medications on file prior to visit.   He states he is no longer using clobetasol cream "too strong", but is using triamcinolone cream instead.  Allergies  Allergen Reactions  . Vancomycin     Red Man Syndrome   . Latex Other (See Comments)    "possibly allergic- dry, itching, rash"   ROS: no fever, chills, dizziness, headache, chest pain, nausea, vomiting.  Psoriasis is controlled. Intermittent edema related to amlodipine.  Some ongoing numbness in this thigh related to prior illness  (he states since red man syndrome).  PHYSICAL EXAM: BP 160/84 mmHg  Pulse 80  Temp(Src) 98.4 F (36.9 C) (Tympanic)  Ht '6\' 1"'$  (1.854 m)  Wt 254 lb 12.8 oz (115.577 kg)  BMI 33.62 kg/m2  Well developed, pleasant male, accompanied by his wife, in no distress.  Well-appearing Left upper back/shoulder: 3.5 x 3.5 cm soft tissue mass on upper left posterior shoulder/back.  Central area is fluctuant, measuring 1.5 cm.  Slight erythema of skin superiorly, no streaking.  Verbal informed consent was obtained--discussed risks (bleeding, infection, scar, recurrence) and alternatives, and elected to proceed. The area was cleansed with alcohol and then anesthetized with 2% lidocaine with epinephrine.  The area was then prepped with betadine, and incised with 11 blade.  A very large amount of very thick white, odorous material was drained, with small amount of thinner yellow-green purulent drainage.  The patient tolerated the procedure well.  There was a large defect/hole left, and iodoform was loosely packed--mostly to act as a wick.  ASSESSMENT/PLAN:  Infected sebaceous cyst - s/p I&D today. leave packing in for 3 days. Wound care reviewed - Plan: cephALEXin (KEFLEX) 500 MG capsule, PR DRAIN SKIN ABSCESS SIMPLE  Essential hypertension - monitor BP at home, and f/u with Dr. Redmond School to discuss tolerability issues with his amlodipine.   Start checking your blood  pressure at home, and bring list to your next visit. Continue the amlodipine for now (your blood pressure was high today). Schedule an appointment with Dr. Redmond School to discuss your blood pressure medication--you might want to switch to a medication that you can tolerate better--that would be safer than periodically stopping your medications.

## 2014-10-28 NOTE — Patient Instructions (Addendum)
Epidermal Cyst An epidermal cyst is sometimes called a sebaceous cyst, epidermal inclusion cyst, or infundibular cyst. These cysts usually contain a substance that looks "pasty" or "cheesy" and may have a bad smell. This substance is a protein called keratin. Epidermal cysts are usually found on the face, neck, or trunk. They may also occur in the vaginal area or other parts of the genitalia of both men and women. Epidermal cysts are usually small, painless, slow-growing bumps or lumps that move freely under the skin. It is important not to try to pop them. This may cause an infection and lead to tenderness and swelling. CAUSES  Epidermal cysts may be caused by a deep penetrating injury to the skin or a plugged hair follicle, often associated with acne. SYMPTOMS  Epidermal cysts can become inflamed and cause:  Redness.  Tenderness.  Increased temperature of the skin over the bumps or lumps.  Grayish-white, bad smelling material that drains from the bump or lump. DIAGNOSIS  Epidermal cysts are easily diagnosed by your caregiver during an exam. Rarely, a tissue sample (biopsy) may be taken to rule out other conditions that may resemble epidermal cysts. TREATMENT   Epidermal cysts often get better and disappear on their own. They are rarely ever cancerous.  If a cyst becomes infected, it may become inflamed and tender. This may require opening and draining the cyst. Treatment with antibiotics may be necessary. When the infection is gone, the cyst may be removed with minor surgery.  Small, inflamed cysts can often be treated with antibiotics or by injecting steroid medicines.  Sometimes, epidermal cysts become large and bothersome. If this happens, surgical removal in your caregiver's office may be necessary. HOME CARE INSTRUCTIONS  Only take over-the-counter or prescription medicines as directed by your caregiver.  Take your antibiotics as directed. Finish them even if you start to feel  better. SEEK MEDICAL CARE IF:   Your cyst becomes tender, red, or swollen.  Your condition is not improving or is getting worse.  You have any other questions or concerns. MAKE SURE YOU:  Understand these instructions.  Will watch your condition.  Will get help right away if you are not doing well or get worse. Document Released: 05/15/2004 Document Revised: 09/06/2011 Document Reviewed: 12/21/2010 [facility] Patient Information 2015 [place], [place]. This information is not intended to replace advice given to you by your health care provider. Make sure you discuss any questions you have with your health care provider.  Incision and Drainage Care After Refer to this sheet in the next few weeks. These instructions provide you with information on caring for yourself after your procedure. Your caregiver may also give you more specific instructions. Your treatment has been planned according to current medical practices, but problems sometimes occur. Call your caregiver if you have any problems or questions after your procedure. HOME CARE INSTRUCTIONS   If antibiotic medicine is given, take it as directed. Finish it even if you start to feel better.  Only take over-the-counter or prescription medicines for pain, discomfort, or fever as directed by your caregiver.  Keep all follow-up appointments as directed by your caregiver.  Change any bandages (dressings) as directed by your caregiver. Replace old dressings with clean dressings.  Wash your hands before and after caring for your wound. You will receive specific instructions for cleansing and caring for your wound.  SEEK MEDICAL CARE IF:   You have increased pain, swelling, or redness around the wound.  You have increased drainage, smell, or  bleeding from the wound.  You have muscle aches, chills, or you feel generally sick.  You have a fever. MAKE SURE YOU:   Understand these instructions.  Will watch your condition.  Will  get help right away if you are not doing well or get worse. Document Released: 09/06/2011 Document Reviewed: 09/06/2011 [facility] Patient Information 2015 [place]. This information is not intended to replace advice given to you by your health care provider. Make sure you discuss any questions you have with your health care provider.  There has been packing placed in the wound.  The edge of this is helping keep the skin open so that it can continue to drain.  After 3 days, you may remove the packing.  I recommend warm soaks to the area 2-3 times daily until the wound has completely healed. Expect that the cyst might eventually fill back up.  Consider seeing a surgeon for excision prior to it getting so large or infected.   Start checking your blood pressure at home, and bring list to your next visit. Continue the amlodipine for now (your blood pressure was high today). Schedule an appointment with Dr. Redmond School to discuss your blood pressure medication--you might want to switch to a medication that you can tolerate better--that would be safer than periodically stopping your medications.

## 2014-10-31 DIAGNOSIS — H3570 Unspecified separation of retinal layers: Secondary | ICD-10-CM | POA: Diagnosis not present

## 2014-10-31 DIAGNOSIS — H35051 Retinal neovascularization, unspecified, right eye: Secondary | ICD-10-CM | POA: Diagnosis not present

## 2014-10-31 DIAGNOSIS — H3532 Exudative age-related macular degeneration: Secondary | ICD-10-CM | POA: Diagnosis not present

## 2014-11-12 NOTE — Progress Notes (Signed)
   Subjective:    Patient ID: Alex Edwards, male    DOB: [birth year], 79 y.o.   MRN: [id number]  HPI    Review of Systems     Objective:   Physical Exam        Assessment & Plan:  Error

## 2014-11-26 ENCOUNTER — Encounter: Payer: Medicare Other | Admitting: Family Medicine

## 2014-12-05 DIAGNOSIS — H3532 Exudative age-related macular degeneration: Secondary | ICD-10-CM | POA: Diagnosis not present

## 2014-12-09 ENCOUNTER — Ambulatory Visit (INDEPENDENT_AMBULATORY_CARE_PROVIDER_SITE_OTHER): Payer: Medicare Other | Admitting: Family Medicine

## 2014-12-09 ENCOUNTER — Encounter: Payer: Self-pay | Admitting: Family Medicine

## 2014-12-09 VITALS — BP 126/80 | HR 70 | Wt 259.0 lb

## 2014-12-09 DIAGNOSIS — I10 Essential (primary) hypertension: Secondary | ICD-10-CM | POA: Diagnosis not present

## 2014-12-09 DIAGNOSIS — L570 Actinic keratosis: Secondary | ICD-10-CM | POA: Diagnosis not present

## 2014-12-09 DIAGNOSIS — Z9660 Presence of unspecified orthopedic joint implant: Secondary | ICD-10-CM

## 2014-12-09 DIAGNOSIS — Z96649 Presence of unspecified artificial hip joint: Secondary | ICD-10-CM

## 2014-12-09 DIAGNOSIS — E669 Obesity, unspecified: Secondary | ICD-10-CM

## 2014-12-09 DIAGNOSIS — L409 Psoriasis, unspecified: Secondary | ICD-10-CM | POA: Diagnosis not present

## 2014-12-09 DIAGNOSIS — J301 Allergic rhinitis due to pollen: Secondary | ICD-10-CM | POA: Diagnosis not present

## 2014-12-09 DIAGNOSIS — E785 Hyperlipidemia, unspecified: Secondary | ICD-10-CM

## 2014-12-09 MED ORDER — METOPROLOL TARTRATE 50 MG PO TABS: 50.0000 mg | ORAL_TABLET | Freq: Two times a day (BID) | ORAL | Status: DC

## 2014-12-09 NOTE — Progress Notes (Signed)
   Subjective:    Patient ID: Alex Edwards, male    DOB: 30-Jul-1935, 79 y.o.   MRN: [id number]  HPI He is here for an interval evaluation. He thinks that amlodipine is causing difficulty with leg cramps as well as a dry cough and leg swelling. He has not been on the medication in approximately one month. He also has a history of actinic keratoses and several on his face. He would like my opinion on treatment. He has psoriasis mainly of the hands and presently is using triamcinolone with good results. His allergies seem to be under good control. He did have difficulty with THR and infection but at this point surgery has been done and he is now back to normal. He is a former smoker. His marriage is going well. He admits to overeating. Immunizations were reviewed. He refuses to have the shingles vaccine. He also is not interested in an advanced directive. He has had no difficulty with chest pain, abdominal distress.   Review of Systems  All other systems reviewed and are negative.      Objective:   Physical Exam Alert and in no distress. Exam of the face and scalp does show multiple erythematous patchy lesions.Tympanic membranes and canals are normal. Pharyngeal area is normal. Neck is supple without adenopathy or thyromegaly. Cardiac exam shows a regular sinus rhythm without murmurs or gallops. Lungs are clear to auscultation.Abdominal exam shows no masses or tenderness. Exam of his hands does show slight erythema and drying with scaling.        Assessment & Plan:  Essential hypertension - Plan: metoprolol (LOPRESSOR) 50 MG tablet  Hyperlipidemia with target LDL less than 100  Psoriasis  Obesity (BMI 30-39.9)  Allergic rhinitis due to pollen  Status post THR (total hip replacement)  Actinic keratosis of multiple sites of head and neck I will stop his amlodipine and switch him to Lopressor at his request. He will return here in one month for follow-up. He will continue to use the  triamcinolone on his hands. Recommend he discuss using a topical medication on his actinic keratosis. Explained that this will also help some of a subclinical lesions as well as ones that he can see. He will continue on his allergy medications.

## 2015-01-09 DIAGNOSIS — H3532 Exudative age-related macular degeneration: Secondary | ICD-10-CM | POA: Diagnosis not present

## 2015-01-13 ENCOUNTER — Ambulatory Visit (INDEPENDENT_AMBULATORY_CARE_PROVIDER_SITE_OTHER): Payer: Medicare Other | Admitting: Family Medicine

## 2015-01-13 VITALS — BP 134/76 | HR 76 | Ht 73.0 in | Wt 250.0 lb

## 2015-01-13 DIAGNOSIS — Z86010 Personal history of colonic polyps: Secondary | ICD-10-CM | POA: Insufficient documentation

## 2015-01-13 DIAGNOSIS — I10 Essential (primary) hypertension: Secondary | ICD-10-CM | POA: Diagnosis not present

## 2015-01-13 DIAGNOSIS — L409 Psoriasis, unspecified: Secondary | ICD-10-CM | POA: Diagnosis not present

## 2015-01-13 DIAGNOSIS — D692 Other nonthrombocytopenic purpura: Secondary | ICD-10-CM

## 2015-01-13 DIAGNOSIS — Z720 Tobacco use: Secondary | ICD-10-CM | POA: Diagnosis not present

## 2015-01-13 DIAGNOSIS — F17200 Nicotine dependence, unspecified, uncomplicated: Secondary | ICD-10-CM

## 2015-01-13 NOTE — Progress Notes (Signed)
   Subjective:    Patient ID: Alex Edwards, male    DOB: 01-06-1936, 79 y.o.   MRN: [id number]  HPI He is here for recheck on his blood pressure. He also admits to starting smoking again. He continues have difficulty with psoriasis mainly of his hands. He had an outbreak of contact dermatitis and since then has had worsening of his psoriasis it affects mainly the palms of his hands. He also has noted some purpuric lesions present on his arms. These of been getting worse over the last several years. He also has questions concerning repeating the colonoscopy. He would like to wait a few years. He does have a history of tubular adenoma   Review of Systems     Objective:   Physical Exam Alert and in no distress. Purpuric lesions noted on both arms mainly on the dorsal surface of the hands. Blood pressure is recorded.       Assessment & Plan:  Essential hypertension  Psoriasis  Smoker  Senile purpura  Hx of adenomatous colonic polyps  he will continue on his blood pressure medication. Encouraged him to quit smoking again and he will consider it. Discussed the senile purpura with him at this time no therapy necessary. Also encouraged him to follow-up with his gastroenterologist concerning repeat colonoscopy. He will continue to use the steroids preparation for his psoriasis.

## 2015-02-13 DIAGNOSIS — H3532 Exudative age-related macular degeneration: Secondary | ICD-10-CM | POA: Diagnosis not present

## 2015-03-20 DIAGNOSIS — H3532 Exudative age-related macular degeneration: Secondary | ICD-10-CM | POA: Diagnosis not present

## 2015-04-09 ENCOUNTER — Other Ambulatory Visit (INDEPENDENT_AMBULATORY_CARE_PROVIDER_SITE_OTHER): Payer: Medicare Other

## 2015-04-09 DIAGNOSIS — Z23 Encounter for immunization: Secondary | ICD-10-CM

## 2015-04-16 ENCOUNTER — Other Ambulatory Visit: Payer: Self-pay | Admitting: Family Medicine

## 2015-04-24 DIAGNOSIS — H353211 Exudative age-related macular degeneration, right eye, with active choroidal neovascularization: Secondary | ICD-10-CM | POA: Diagnosis not present

## 2015-05-29 DIAGNOSIS — H353211 Exudative age-related macular degeneration, right eye, with active choroidal neovascularization: Secondary | ICD-10-CM | POA: Diagnosis not present

## 2015-06-02 ENCOUNTER — Encounter: Payer: Self-pay | Admitting: Family Medicine

## 2015-06-02 ENCOUNTER — Ambulatory Visit (INDEPENDENT_AMBULATORY_CARE_PROVIDER_SITE_OTHER): Payer: Medicare Other | Admitting: Family Medicine

## 2015-06-02 VITALS — BP 122/70 | HR 92 | Temp 98.1°F | Ht 73.0 in | Wt 253.8 lb

## 2015-06-02 DIAGNOSIS — J209 Acute bronchitis, unspecified: Secondary | ICD-10-CM

## 2015-06-02 MED ORDER — AZITHROMYCIN 250 MG PO TABS: ORAL_TABLET | ORAL | Status: DC

## 2015-06-02 NOTE — Patient Instructions (Signed)
Drink plenty of fluid. Consider using a nasal saline spray to help with dryness/congestion. I recommend using guaifenesin (found in robitussin and mucinex)--this is an expectorant that will loosen up the phlegm and mucus.  Take the antibiotic as directed--you need this due to focal abnormality on your lung exam (wheezing on the right, suggestion infection). Call/return later this week if you are getting worse rather than better.  Call after 10 days if you aren't 100% better.  Congratulations on quitting smoking!

## 2015-06-02 NOTE — Progress Notes (Signed)
Chief Complaint  Patient presents with  . Cough    feels like he has had some SOB, but is somewhat better. Feels like his cough is dry but mentions that his mucus is yellow when he is able to cough something up. No headaches, no fever, chills or body aches. His symptoms have been a little over a week.    8-9 days ago he started with congestion, cough, sneezing. Never had fever or chills.  His nose has been stopped up, but today the right nostril opened up and got yellow drainage.  He is coughing up yellow phlegm, and complains of wheezing.  He feels like the phlegm has gotten lighter over the last couple of days.  He got a little short of breath after a coughing spell.  Denies DOE  He has been taking Allegra, and used Flonase just once.  No other OTC medications.  +sick contacts--son with pneumonia, and grandson with a cold. Wife is now also sick (he was sick first).  PMH, PSH, SH reviewed.  Outpatient Encounter Prescriptions as of 06/02/2015  Medication Sig Note  . amLODipine (NORVASC) 5 MG tablet Take 5 mg by mouth daily.  06/02/2015: Received from: External Pharmacy  . Ascorbic Acid (VITAMIN C) 1000 MG tablet Take 1,000 mg by mouth daily.   . Cranberry-Vitamin C ([facility] CRANBERRY) 84-20 MG CAPS Take 2 tablets by mouth daily.    . fexofenadine (ALLEGRA) 180 MG tablet Take 180 mg by mouth daily.   . Ranibizumab (LUCENTIS IO) Place into the right eye as directed. Every 5 to 6 weeks injections   . Tetrahydrozoline HCl (VISINE OP) Apply 1-2 drops to eye every morning.   . triamcinolone cream (KENALOG) 0.1 % Apply 1 application topically 2 (two) times daily.   Marland Kitchen loratadine (CLARITIN) 10 MG tablet Take 1 tablet (10 mg total) by mouth daily. (Patient not taking: Reported on 06/02/2015)   . metoprolol (LOPRESSOR) 50 MG tablet TAKE 1 TABLET TWICE DAILY. (Patient not taking: Reported on 06/02/2015) 06/02/2015: Stopped taking it last month "took myself off of it"   No facility-administered encounter  medications on file as of 06/02/2015.   Allergies  Allergen Reactions  . Vancomycin     Red Man Syndrome   . Latex Other (See Comments)    "possibly allergic- dry, itching, rash"    Denies fever, chills, ear pain, nausea, vomiting.  He had some diarrhea initially with recent illness, resolved.  Denies sore throat, bleeding, bruising, rash.  Denies any headaches, chest pain.  Shortness of breath only after a coughing spell, no dyspnea with exertion.  PHYSICAL EXAM: BP 122/70 mmHg  Pulse 92  Temp(Src) 98.1 F (36.7 C) (Tympanic)  Ht '6\' 1"'$  (1.854 m)  Wt 253 lb 12.8 oz (115.123 kg)  BMI 33.49 kg/m2  SpO2 97%  Well appearing male, with hoarse voice, occasional sniffling and cough.  Speaking easily in no distress HEENT: PERRL, EOMI, conjunctiva clear.  TM's and EAC's are normal.  Nasal mucosa is only mildly edematous, no purulence.  OP is clear.  Sinuses are nontender Neck:  4 x 2 cm soft tissue mass in the left submandibular area.  This is soft, nontender.  He reports it has been there for 20 years and hasn't changed. No lymphadenopathy otherwise noted Heart; regular rate and rhythm Lungs: lungs have good air movement.  There is focal wheezing on the right side of the chest.  No ronchi or rales. Skin: normal turgor, no rash Neuro: alert and oriented, normal  strength, cranial nerves  ASSESSMENT/PLAN:  Acute bronchitis, unspecified organism - Plan: azithromycin (ZITHROMAX) 250 MG tablet   URI, with suspected bacterial component on the right side (bronchitis). May have started out as viral. Former smoker.    Drink plenty of fluid. Consider using a nasal saline spray to help with dryness/congestion. I recommend using guaifenesin (found in robitussin and mucinex)--this is an expectorant that will loosen up the phlegm and mucus.  Take the antibiotic as directed--you need this due to focal abnormality on your lung exam (wheezing on the right, suggestion infection). Call/return later  this week if you are getting worse rather than better.  Call after 10 days if you aren't 100% better.  Congratulations on quitting smoking!

## 2015-07-09 ENCOUNTER — Other Ambulatory Visit: Payer: Self-pay | Admitting: Family Medicine

## 2015-07-22 DIAGNOSIS — H353211 Exudative age-related macular degeneration, right eye, with active choroidal neovascularization: Secondary | ICD-10-CM | POA: Diagnosis not present

## 2015-08-12 DIAGNOSIS — H353112 Nonexudative age-related macular degeneration, right eye, intermediate dry stage: Secondary | ICD-10-CM | POA: Diagnosis not present

## 2015-08-12 DIAGNOSIS — H3570 Unspecified separation of retinal layers: Secondary | ICD-10-CM | POA: Diagnosis not present

## 2015-08-12 DIAGNOSIS — G4730 Sleep apnea, unspecified: Secondary | ICD-10-CM | POA: Diagnosis not present

## 2015-08-12 DIAGNOSIS — H353211 Exudative age-related macular degeneration, right eye, with active choroidal neovascularization: Secondary | ICD-10-CM | POA: Diagnosis not present

## 2015-08-26 DIAGNOSIS — H353211 Exudative age-related macular degeneration, right eye, with active choroidal neovascularization: Secondary | ICD-10-CM | POA: Diagnosis not present

## 2015-08-27 DIAGNOSIS — R0683 Snoring: Secondary | ICD-10-CM | POA: Diagnosis not present

## 2015-08-29 DIAGNOSIS — R0683 Snoring: Secondary | ICD-10-CM | POA: Diagnosis not present

## 2015-10-01 DIAGNOSIS — C44729 Squamous cell carcinoma of skin of left lower limb, including hip: Secondary | ICD-10-CM | POA: Diagnosis not present

## 2015-10-01 DIAGNOSIS — D225 Melanocytic nevi of trunk: Secondary | ICD-10-CM | POA: Diagnosis not present

## 2015-10-01 DIAGNOSIS — C44319 Basal cell carcinoma of skin of other parts of face: Secondary | ICD-10-CM | POA: Diagnosis not present

## 2015-10-08 DIAGNOSIS — H353211 Exudative age-related macular degeneration, right eye, with active choroidal neovascularization: Secondary | ICD-10-CM | POA: Diagnosis not present

## 2015-10-29 DIAGNOSIS — Z85828 Personal history of other malignant neoplasm of skin: Secondary | ICD-10-CM | POA: Diagnosis not present

## 2015-10-29 DIAGNOSIS — Z08 Encounter for follow-up examination after completed treatment for malignant neoplasm: Secondary | ICD-10-CM | POA: Diagnosis not present

## 2015-11-13 DIAGNOSIS — H353112 Nonexudative age-related macular degeneration, right eye, intermediate dry stage: Secondary | ICD-10-CM | POA: Diagnosis not present

## 2015-11-13 DIAGNOSIS — H353211 Exudative age-related macular degeneration, right eye, with active choroidal neovascularization: Secondary | ICD-10-CM | POA: Diagnosis not present

## 2015-12-11 DIAGNOSIS — H353211 Exudative age-related macular degeneration, right eye, with active choroidal neovascularization: Secondary | ICD-10-CM | POA: Diagnosis not present

## 2016-02-10 DIAGNOSIS — L562 Photocontact dermatitis [berloque dermatitis]: Secondary | ICD-10-CM | POA: Diagnosis not present

## 2016-02-10 DIAGNOSIS — Z85828 Personal history of other malignant neoplasm of skin: Secondary | ICD-10-CM | POA: Diagnosis not present

## 2016-02-10 DIAGNOSIS — L820 Inflamed seborrheic keratosis: Secondary | ICD-10-CM | POA: Diagnosis not present

## 2016-02-10 DIAGNOSIS — Z08 Encounter for follow-up examination after completed treatment for malignant neoplasm: Secondary | ICD-10-CM | POA: Diagnosis not present

## 2016-02-10 DIAGNOSIS — C44319 Basal cell carcinoma of skin of other parts of face: Secondary | ICD-10-CM | POA: Diagnosis not present

## 2016-03-16 ENCOUNTER — Other Ambulatory Visit (INDEPENDENT_AMBULATORY_CARE_PROVIDER_SITE_OTHER): Payer: Medicare Other

## 2016-03-16 DIAGNOSIS — Z23 Encounter for immunization: Secondary | ICD-10-CM | POA: Diagnosis not present

## 2016-03-23 DIAGNOSIS — X32XXXD Exposure to sunlight, subsequent encounter: Secondary | ICD-10-CM | POA: Diagnosis not present

## 2016-03-23 DIAGNOSIS — L570 Actinic keratosis: Secondary | ICD-10-CM | POA: Diagnosis not present

## 2016-03-23 DIAGNOSIS — C44319 Basal cell carcinoma of skin of other parts of face: Secondary | ICD-10-CM | POA: Diagnosis not present

## 2016-06-01 ENCOUNTER — Other Ambulatory Visit: Payer: Self-pay | Admitting: Family Medicine

## 2016-07-26 ENCOUNTER — Other Ambulatory Visit: Payer: Self-pay | Admitting: Family Medicine

## 2016-07-26 ENCOUNTER — Encounter: Payer: Self-pay | Admitting: Family Medicine

## 2016-07-26 ENCOUNTER — Ambulatory Visit (INDEPENDENT_AMBULATORY_CARE_PROVIDER_SITE_OTHER): Payer: Medicare Other | Admitting: Family Medicine

## 2016-07-26 VITALS — BP 130/80 | HR 76 | Ht 73.0 in | Wt 259.0 lb

## 2016-07-26 DIAGNOSIS — J3089 Other allergic rhinitis: Secondary | ICD-10-CM

## 2016-07-26 DIAGNOSIS — Z136 Encounter for screening for cardiovascular disorders: Secondary | ICD-10-CM

## 2016-07-26 DIAGNOSIS — Z0000 Encounter for general adult medical examination without abnormal findings: Secondary | ICD-10-CM | POA: Diagnosis not present

## 2016-07-26 DIAGNOSIS — E785 Hyperlipidemia, unspecified: Secondary | ICD-10-CM

## 2016-07-26 DIAGNOSIS — I10 Essential (primary) hypertension: Secondary | ICD-10-CM

## 2016-07-26 DIAGNOSIS — Z86010 Personal history of colonic polyps: Secondary | ICD-10-CM

## 2016-07-26 DIAGNOSIS — Z96642 Presence of left artificial hip joint: Secondary | ICD-10-CM

## 2016-07-26 DIAGNOSIS — D692 Other nonthrombocytopenic purpura: Secondary | ICD-10-CM | POA: Diagnosis not present

## 2016-07-26 DIAGNOSIS — R7309 Other abnormal glucose: Secondary | ICD-10-CM | POA: Diagnosis not present

## 2016-07-26 DIAGNOSIS — R220 Localized swelling, mass and lump, head: Secondary | ICD-10-CM | POA: Diagnosis not present

## 2016-07-26 DIAGNOSIS — E669 Obesity, unspecified: Secondary | ICD-10-CM

## 2016-07-26 LAB — CBC WITH DIFFERENTIAL/PLATELET
Basophils Absolute: 0 cells/uL (ref 0–200)
Basophils Relative: 0 %
EOS ABS: 204 {cells}/uL (ref 15–500)
Eosinophils Relative: 3 %
HEMATOCRIT: 45.6 % (ref 38.5–50.0)
Hemoglobin: 15.5 g/dL (ref 13.2–17.1)
Lymphocytes Relative: 30 %
Lymphs Abs: 2040 cells/uL (ref 850–3900)
MCH: 31.6 pg (ref 27.0–33.0)
MCHC: 34 g/dL (ref 32.0–36.0)
MCV: 92.9 fL (ref 80.0–100.0)
MPV: 10.1 fL (ref 7.5–12.5)
Monocytes Absolute: 816 cells/uL (ref 200–950)
Monocytes Relative: 12 %
NEUTROS PCT: 55 %
Neutro Abs: 3740 cells/uL (ref 1500–7800)
Platelets: 258 10*3/uL (ref 140–400)
RBC: 4.91 MIL/uL (ref 4.20–5.80)
RDW: 13.2 % (ref 11.0–15.0)
WBC: 6.8 10*3/uL (ref 4.0–10.5)

## 2016-07-26 LAB — COMPREHENSIVE METABOLIC PANEL
ALT: 25 U/L (ref 9–46)
AST: 23 U/L (ref 10–35)
Albumin: 4.5 g/dL (ref 3.6–5.1)
Alkaline Phosphatase: 53 U/L (ref 40–115)
BUN: 16 mg/dL (ref 7–25)
CHLORIDE: 101 mmol/L (ref 98–110)
CO2: 26 mmol/L (ref 20–31)
Calcium: 9.6 mg/dL (ref 8.6–10.3)
Creat: 1.11 mg/dL (ref 0.70–1.18)
GLUCOSE: 125 mg/dL — AB (ref 65–99)
POTASSIUM: 4.6 mmol/L (ref 3.5–5.3)
Sodium: 137 mmol/L (ref 135–146)
Total Bilirubin: 0.7 mg/dL (ref 0.2–1.2)
Total Protein: 7.5 g/dL (ref 6.1–8.1)

## 2016-07-26 LAB — LIPID PANEL
Cholesterol: 209 mg/dL — ABNORMAL HIGH (ref <200)
HDL: 36 mg/dL — ABNORMAL LOW (ref >40)
LDL CALC: 127 mg/dL — AB (ref <100)
Total CHOL/HDL Ratio: 5.8 Ratio — ABNORMAL HIGH (ref <5.0)
Triglycerides: 230 mg/dL — ABNORMAL HIGH (ref <150)
VLDL: 46 mg/dL — ABNORMAL HIGH (ref <30)

## 2016-07-26 MED ORDER — AMLODIPINE BESYLATE 5 MG PO TABS: 5.0000 mg | ORAL_TABLET | Freq: Every day | ORAL | 3 refills | Status: DC

## 2016-07-26 MED ORDER — METOPROLOL TARTRATE 50 MG PO TABS: 50.0000 mg | ORAL_TABLET | Freq: Two times a day (BID) | ORAL | 3 refills | Status: DC

## 2016-07-26 NOTE — Patient Instructions (Signed)
A good multivitamin every day like Centrum or Theragran-M

## 2016-07-26 NOTE — Progress Notes (Addendum)
Subjective:   HPI  Alex Edwards is a 81 y.o. male who presents for a complete physical.  Medical care team includes:  Dr.Rankin [facility]  Dr.Hall allergy   Preventative care: Last ophthalmology visit: 11/2015 Last dental visit: N/a Last colonoscopy:11/24/11 Last prostate exam: Redmond School Last EKG: 12/25/13 Last labs:01/12/13  Prior vaccinations: TD or Tdap:02/11/12 Influenza:03/16/16 Pneumococcal: 23:03/29/05 13: 04/23/14 Shingles/Zostavax: Advanced directive: Health care power of attorney: Living will:No. Information given.  Concerns: No particular concerns or complaints other than noting more fatigue as he gets older.Marland Kitchen He is doing quite well after his hip replacement. He did have difficulty with an infection after that but is now doing well. Continues on his blood pressure medications. He does have a history of adenomatous colonic polyps. His allergies seem to be under good control. He does keep himself physically active with walking several times per week for at least 20 minutes. He has been married for over 5 years. He does see the dermatologist and has had several skin cancers removed. Reviewed their medical, surgical, family, social, medication, and allergy history and updated chart as appropriate.  Review of Systems Constitutional: -fever, -chills, -sweats, -unexpected weight change, -decreased appetite, -fatigue Allergy: -sneezing, -itching, -congestion Dermatology: -changing moles, --rash, -lumps ENT: -runny nose, -ear pain, -sore throat, -hoarseness, -sinus pain, -teeth pain, - ringing in ears, -hearing loss, -nosebleeds Cardiology: -chest pain, -palpitations, -swelling, -difficulty breathing when lying flat, -waking up short of breath Respiratory: -cough, -shortness of breath, -difficulty breathing with exercise or exertion, -wheezing, -coughing up blood Gastroenterology: -abdominal pain, -nausea, -vomiting, -diarrhea, -constipation, -blood in stool, -changes in bowel movement,  -difficulty swallowing or eating Hematology: -bleeding, -bruising  Musculoskeletal: -joint aches, -muscle aches, -joint swelling, -back pain, -neck pain, -cramping, -changes in gait Ophthalmology: denies vision changes, eye redness, itching, discharge Urology: -burning with urination, -difficulty urinating, -blood in urine, -urinary frequency, -urgency, -incontinence Neurology: -headache, -weakness, -tingling, -numbness, -memory loss, -falls, -dizziness Psychology: -depressed mood, -agitation, -sleep problems     Objective:   Physical Exam   General appearance: alert, no distress, WD/WN,  Skin: Benign purpuric lesions noted on his forearms. HEENT: normocephalic, conjunctiva/corneas normal, sclerae anicteric, PERRLA, EOMi, nares patent, no discharge or erythema, pharynx normal rate cerumen was removed from the left ear. Oral cavity: MMM, tongue normal, teeth normal Neck: supple, no lymphadenopathy, no thyromegaly, 3 cm cystic submandibular mass noted normal ROM Chest: non tender, normal shape and expansion Heart: RRR, normal S1, S2, no murmurs Lungs: CTA bilaterally, no wheezes, rhonchi, or rales Abdomen: +bs, soft, non tender, non distended, no masses, no hepatomegaly, no splenomegaly, no bruits  Musculoskeletal: upper extremities non tender, no obvious deformity, normal ROM throughout, lower extremities non tender, no obvious deformity, normal ROM throughout Extremities: no edema, no cyanosis, no clubbing Pulses: 2+ symmetric, upper and lower extremities, normal cap refill Neurological: alert, oriented x 3, CN2-12 intact, strength normal upper extremities and lower extremities, sensation normal throughout, DTRs 2+ throughout, no cerebellar signs, gait normal Psychiatric: normal affect, behavior normal, pleasant    Assessment and Plan :  Status post total replacement of left hip  Senile purpura (HCC)  Obesity (BMI 30-39.9) - Plan: CBC with Differential/Platelet, Comprehensive  metabolic panel, Lipid panel  Essential hypertension - Plan: CBC with Differential/Platelet, Comprehensive metabolic panel, amLODipine (NORVASC) 5 MG tablet, metoprolol (LOPRESSOR) 50 MG tablet  Hyperlipidemia with target LDL less than 100 - Plan: Lipid panel  Hx of adenomatous colonic polyps  Chronic nonseasonal allergic rhinitis due to pollen  Screening for AAA (  abdominal aortic aneurysm) - Plan: US ABDOMINAL AORTA SCREENING AAA He will continue on his present medications. Encouraged him to remain physically active. Discussed the colonic polyps follow-up and at this point he is not interested in getting another colonoscopy done. He will continue on his allergy medications. The submandibular mass has been there for over 20 years.  Physical exam - discussed healthy lifestyle, diet, exercise, preventative care, vaccinations, and addressed their concerns.    Follow-up as needed

## 2016-07-28 LAB — HEMOGLOBIN A1C
Hgb A1c MFr Bld: 6.7 % — ABNORMAL HIGH (ref <5.7)
MEAN PLASMA GLUCOSE: 146 mg/dL

## 2016-07-30 ENCOUNTER — Ambulatory Visit (INDEPENDENT_AMBULATORY_CARE_PROVIDER_SITE_OTHER): Payer: Medicare Other | Admitting: Family Medicine

## 2016-07-30 ENCOUNTER — Other Ambulatory Visit: Payer: Self-pay

## 2016-07-30 DIAGNOSIS — E119 Type 2 diabetes mellitus without complications: Secondary | ICD-10-CM

## 2016-07-30 MED ORDER — GLUCOSE BLOOD VI STRP: ORAL_STRIP | 4 refills | Status: DC

## 2016-07-30 MED ORDER — ONETOUCH DELICA LANCETS FINE MISC: 4 refills | Status: AC

## 2016-07-30 NOTE — Progress Notes (Addendum)
   Subjective:    Patient ID: Rolland Steinert, male    DOB: [birth year], 81 y.o.   MRN: [id number]  HPI he is here for consult concerning new onset diabetes. His most recent blood work did show slightly elevated blood sugar and A1c was 6.7. He is here for further consultation concerning that. He also has a previous history of difficulty with statin intolerance. He apparently has tried Lipitor, Crestor and Zocor with not much success. He also is not interested in paying large co-pays for medication.  Review of Systems     Objective:   Physical Exam Alert and in no distress otherwise not examined       Assessment & Plan:  New onset type 2 diabetes mellitus ([place]) I discussed the diabetes diagnosis with him in regard to diet, exercise, foot care, eye care, and blood sugars regularly.I discussed the natural history of diabetes. Discussed the need for blood pressure control, cholesterol and down the road blood sugar monitoring and control. Offered referral to diabetes and nutrition however he declined. His wife has diabetes. Also discussed risk of heart disease from this. Glucometer given. He is to return here on one month for recheck. Over 25 minutes, the entire session spent in counseling and coordination of care. A sample of Livalo 4 mg given. He is to take half a pill and let me know he can tolerate it.

## 2016-07-30 NOTE — Patient Instructions (Signed)
Go to [email or web address]

## 2016-08-05 ENCOUNTER — Ambulatory Visit
Admission: RE | Admit: 2016-08-05 | Discharge: 2016-08-05 | Disposition: A | Discharge status: Home / Self Care | Payer: Medicare Other | Source: Ambulatory Visit | Attending: Family Medicine | Admitting: Family Medicine

## 2016-08-05 ENCOUNTER — Ambulatory Visit (INDEPENDENT_AMBULATORY_CARE_PROVIDER_SITE_OTHER): Payer: Medicare Other | Admitting: Family Medicine

## 2016-08-05 ENCOUNTER — Encounter: Payer: Self-pay | Admitting: Family Medicine

## 2016-08-05 VITALS — BP 130/80 | HR 80 | Temp 98.3°F | Wt 256.0 lb

## 2016-08-05 DIAGNOSIS — J209 Acute bronchitis, unspecified: Secondary | ICD-10-CM | POA: Diagnosis not present

## 2016-08-05 DIAGNOSIS — J111 Influenza due to unidentified influenza virus with other respiratory manifestations: Secondary | ICD-10-CM

## 2016-08-05 DIAGNOSIS — Z136 Encounter for screening for cardiovascular disorders: Secondary | ICD-10-CM

## 2016-08-05 MED ORDER — AZITHROMYCIN 500 MG PO TABS: 500.0000 mg | ORAL_TABLET | Freq: Every day | ORAL | 0 refills | Status: DC

## 2016-08-05 NOTE — Progress Notes (Signed)
   Subjective:    Patient ID: Alex Edwards, male    DOB: [birth year], 81 y.o.   MRN: [id number]  HPI 5 days ago he started having difficulty with cough and congestion as well as headache and fever. On Tuesday of this week the coughing got much worse and he continues to have increasing difficulty with cough and congestion as well as headache. States in the last several days his cough and congestion got much worse.  Review of Systems     Objective:   Physical Exam Alert and in no distress. Tympanic membranes and canals are normal. Pharyngeal area is normal. Neck is supple without adenopathy or thyromegaly. Cardiac exam shows a regular sinus rhythm without murmurs or gallops. Lungs scattered rhonchi. resp rate is between 18 and 20       Assessment & Plan:  Influenza  Acute bronchitis, unspecified organism - Plan: azithromycin (ZITHROMAX) 500 MG tablet [facility] thought he had influenza but now worried about secondary bacterial infection. I will place him on Zithromax. Also told him to use albuterol that was called in recently for his wife. Caution him to call if he gets worse.

## 2016-08-27 ENCOUNTER — Ambulatory Visit: Payer: Medicare Other | Admitting: Family Medicine

## 2016-09-22 ENCOUNTER — Ambulatory Visit (INDEPENDENT_AMBULATORY_CARE_PROVIDER_SITE_OTHER): Payer: Medicare Other | Admitting: Family Medicine

## 2016-09-22 ENCOUNTER — Encounter: Payer: Self-pay | Admitting: Family Medicine

## 2016-09-22 VITALS — BP 122/80 | HR 76 | Wt 252.0 lb

## 2016-09-22 DIAGNOSIS — E119 Type 2 diabetes mellitus without complications: Secondary | ICD-10-CM | POA: Insufficient documentation

## 2016-09-22 DIAGNOSIS — Z789 Other specified health status: Secondary | ICD-10-CM | POA: Diagnosis not present

## 2016-09-22 NOTE — Patient Instructions (Signed)
Keep checking her blood sugars either before a meal or 2 hours after a meal. I want her blood sugar 2 hours after meal to be 180 or lower

## 2016-09-22 NOTE — Progress Notes (Signed)
   Subjective:    Patient ID: Alex Edwards, male    DOB: [birth year], 81 y.o.   MRN: [id number]  HPI he is here for recheck. He has been checking his blood sugars regularly and did bring in the readings. In general the numbers are in the low 100s. He has made some dietary and exercise changes. His wife is also diabetic so they're helping. He also states that Livalo is going to cost him over $90 a month and he is unwilling to pay that. He has had difficulty with other statins. So states that the amlodipine is causing some edema but he seems be handling this fairly well. Review of Systems     Objective:   Physical Exam Alert and in no distress otherwise not examined       Assessment & Plan:  New onset type 2 diabetes mellitus (HCC)  Statin intolerance At this point I do not see the need for adding a glycemic agent to his regimen. Instructed him to keep vigilant on his blood sugars and if they start to increase, we will readdress the issue. Otherwise see him back here in 3 months.

## 2017-01-25 ENCOUNTER — Ambulatory Visit (INDEPENDENT_AMBULATORY_CARE_PROVIDER_SITE_OTHER): Payer: Medicare Other | Admitting: Family Medicine

## 2017-01-25 ENCOUNTER — Encounter: Payer: Self-pay | Admitting: Family Medicine

## 2017-01-25 ENCOUNTER — Telehealth: Payer: Self-pay | Admitting: Family Medicine

## 2017-01-25 VITALS — BP 112/68 | HR 77 | Ht 73.0 in | Wt 249.6 lb

## 2017-01-25 DIAGNOSIS — J301 Allergic rhinitis due to pollen: Secondary | ICD-10-CM

## 2017-01-25 DIAGNOSIS — Z789 Other specified health status: Secondary | ICD-10-CM

## 2017-01-25 DIAGNOSIS — E785 Hyperlipidemia, unspecified: Secondary | ICD-10-CM

## 2017-01-25 DIAGNOSIS — E1159 Type 2 diabetes mellitus with other circulatory complications: Secondary | ICD-10-CM | POA: Diagnosis not present

## 2017-01-25 DIAGNOSIS — I10 Essential (primary) hypertension: Secondary | ICD-10-CM | POA: Diagnosis not present

## 2017-01-25 DIAGNOSIS — H3530 Unspecified macular degeneration: Secondary | ICD-10-CM | POA: Diagnosis not present

## 2017-01-25 DIAGNOSIS — E119 Type 2 diabetes mellitus without complications: Secondary | ICD-10-CM

## 2017-01-25 DIAGNOSIS — E1169 Type 2 diabetes mellitus with other specified complication: Secondary | ICD-10-CM | POA: Diagnosis not present

## 2017-01-25 DIAGNOSIS — E669 Obesity, unspecified: Secondary | ICD-10-CM | POA: Diagnosis not present

## 2017-01-25 DIAGNOSIS — Z86010 Personal history of colonic polyps: Secondary | ICD-10-CM

## 2017-01-25 DIAGNOSIS — Z9849 Cataract extraction status, unspecified eye: Secondary | ICD-10-CM | POA: Diagnosis not present

## 2017-01-25 LAB — POCT GLYCOSYLATED HEMOGLOBIN (HGB A1C): Hemoglobin A1C: 6.5

## 2017-01-25 LAB — POCT UA - MICROALBUMIN
Albumin/Creatinine Ratio, Urine, POC: 13
Creatinine, POC: 100.2 mg/dL
Microalbumin Ur, POC: 13 mg/L

## 2017-01-25 NOTE — Telephone Encounter (Signed)
Received requested Di[facility] exam [facility]. Sending back for review.

## 2017-01-25 NOTE — Progress Notes (Signed)
  Subjective:    Patient ID: Alex Edwards, male    DOB: Apr 25, 1936, 81 y.o.   MRN: [id number]  Alex Edwards is a 81 y.o. male who presents for follow-up of Type 2 diabetes mellitus.  Patient is checking home blood sugars.   Home blood sugar records: low 90 high 161 How often is blood sugars being checked: once a day Current symptoms/problems None Daily foot checks: yes    Any foot concerns: feet swellin Last eye exam: 12/2015 Dr.Rankin Exercise: walking 1 mile a day  Presently he is taking amlodipine as well as Lopressor. He has not been placed on an ace inhibitor yet. He does have statin intolerance. Does exercise regularly. He does not smoke cigarettes but is now smoking E cig intermittently. His allergies are under good control. He does alternate between various antihistamines. He does have macular degeneration and is being followed by Dr. Zadie Rhine and Dr. Katy Fitch. He has a history of colonic polyps but is not interested in pursuing this any further. The following portions of the patient's history were reviewed and updated as appropriate: allergies, current medications, past medical history, past social history and problem list.  ROS as in subjective above.     Objective:    Physical Exam Alert and in no distress otherwise not examined.   Lab Review Diabetic Labs Latest Ref Rng & Units 01/25/2017 07/26/2016 04/06/2014 02/15/2014 02/12/2014  HbA1c - 6.5 6.7(H) - - -  Microalbumin mg/L 13.0 - - - -  Micro/Creat Ratio - 13.0 - - - -  Chol <200 mg/dL - 209(H) - - -  HDL >40 mg/dL - 36(L) - - -  Calc LDL <100 mg/dL - 127(H) - - -  Triglycerides <150 mg/dL - 230(H) - - -  Creatinine 0.70 - 1.18 mg/dL - 1.11 0.99 0.97 1.00   BP/Weight 01/25/2017 09/22/2016 08/05/2016 07/26/2016 61/11/735  Systolic BP 106 269 485 462 703  Diastolic BP 68 80 80 80 70  Wt. (Lbs) 249.6 252 256 259 253.8  BMI 32.93 33.25 33.78 34.17 33.49   A1c is 6.5 Alex Edwards  reports that he quit smoking about 2 years ago. His  smoking use included Cigarettes. He has a 50.00 pack-year smoking history. He has never used smokeless tobacco. He reports that he does not drink alcohol or use drugs.     Assessment & Plan:    Controlled type 2 diabetes mellitus without complication, without long-term current use of insulin ([place])  New onset type 2 diabetes mellitus ([place]) - Plan: HgB A1c, POCT UA - Microalbumin  Statin intolerance  Obesity (BMI 30-39.9)  Hypertension associated with diabetes ([place])  Hx of adenomatous colonic polyps  Hyperlipidemia associated with type 2 diabetes mellitus ([place])  Non-seasonal allergic rhinitis due to pollen  Macular degeneration, unspecified laterality, unspecified type   1. Rx changes: When he runs out of the amlodipine I will then switch him to lisinopril for renal protection as well as blood pressure control 2. Education: Reviewed 'ABCs' of diabetes management (respective goals in parentheses):  A1C (<7), blood pressure (<130/80), and cholesterol (LDL <100). 3. Compliance at present is estimated to be good. Efforts to improve compliance (if necessary) will be directed at Continue present diet and exercise. 4. Follow up: 4 months

## 2017-01-26 NOTE — Telephone Encounter (Signed)
Looking at this report this eye exam is not a diabetic eye exam. [facility] and they have no diabetic eye exam for this pt.

## 2017-03-29 ENCOUNTER — Other Ambulatory Visit (INDEPENDENT_AMBULATORY_CARE_PROVIDER_SITE_OTHER): Payer: Medicare Other

## 2017-03-29 DIAGNOSIS — Z23 Encounter for immunization: Secondary | ICD-10-CM | POA: Diagnosis not present

## 2017-05-25 ENCOUNTER — Encounter: Payer: Self-pay | Admitting: Family Medicine

## 2017-05-25 ENCOUNTER — Ambulatory Visit (INDEPENDENT_AMBULATORY_CARE_PROVIDER_SITE_OTHER): Payer: Medicare Other | Admitting: Family Medicine

## 2017-05-25 VITALS — BP 150/70 | HR 72 | Resp 16 | Wt 251.6 lb

## 2017-05-25 DIAGNOSIS — Z789 Other specified health status: Secondary | ICD-10-CM

## 2017-05-25 DIAGNOSIS — E1169 Type 2 diabetes mellitus with other specified complication: Secondary | ICD-10-CM | POA: Diagnosis not present

## 2017-05-25 DIAGNOSIS — I152 Hypertension secondary to endocrine disorders: Secondary | ICD-10-CM

## 2017-05-25 DIAGNOSIS — J301 Allergic rhinitis due to pollen: Secondary | ICD-10-CM

## 2017-05-25 DIAGNOSIS — E119 Type 2 diabetes mellitus without complications: Secondary | ICD-10-CM | POA: Diagnosis not present

## 2017-05-25 DIAGNOSIS — I10 Essential (primary) hypertension: Secondary | ICD-10-CM

## 2017-05-25 DIAGNOSIS — H3530 Unspecified macular degeneration: Secondary | ICD-10-CM

## 2017-05-25 DIAGNOSIS — E669 Obesity, unspecified: Secondary | ICD-10-CM | POA: Diagnosis not present

## 2017-05-25 DIAGNOSIS — E1159 Type 2 diabetes mellitus with other circulatory complications: Secondary | ICD-10-CM

## 2017-05-25 DIAGNOSIS — L409 Psoriasis, unspecified: Secondary | ICD-10-CM

## 2017-05-25 DIAGNOSIS — E785 Hyperlipidemia, unspecified: Secondary | ICD-10-CM

## 2017-05-25 LAB — POCT GLYCOSYLATED HEMOGLOBIN (HGB A1C): Hemoglobin A1C: 6.6

## 2017-05-25 MED ORDER — LOSARTAN POTASSIUM 50 MG PO TABS: 50.0000 mg | ORAL_TABLET | Freq: Every day | ORAL | 3 refills | Status: DC

## 2017-05-25 NOTE — Patient Instructions (Signed)
If you have problems with the new medicine, let me know

## 2017-05-25 NOTE — Progress Notes (Signed)
  Subjective:    Patient ID: Alex Edwards, male    DOB: 03-28-1936, 81 y.o.   MRN: [id number]  Alex Edwards is a 81 y.o. male who presents for follow-up of Type 2 diabetes mellitus.  Patient is checking home blood sugars.   Home blood sugar records: BGs range between 98 and 115 How often is blood sugars being checked: daily Current symptoms/problems include none and have been unchanged. Daily foot checks: yes   Any foot concerns: no  Last eye exam: not this year.  Plans to see Dr. Zadie Rhine next year.  He does have macular edema Exercise: walks 1 mile per day, sit up and push ups.  He stopped taking amlodipine due to ankle edema.  He has a previous history of statin intolerance.  Presently he is taking metoprolol having no trouble with that.  He does continue to intermittently smoke and is not interested in quitting.  He does have an underlying history of psoriasis.  His allergies seem to be under good control. The following portions of the patient's history were reviewed and updated as appropriate: allergies, current medications, past medical history, past social history and problem list.  ROS as in subjective above.     Objective:    Physical Exam Alert and in no distress otherwise not examined.   Lab Review Diabetic Labs Latest Ref Rng & Units 01/25/2017 07/26/2016 04/06/2014 02/15/2014 02/12/2014  HbA1c - 6.5 6.7(H) - - -  Microalbumin mg/L 13.0 - - - -  Micro/Creat Ratio - 13.0 - - - -  Chol <200 mg/dL - 209(H) - - -  HDL >40 mg/dL - 36(L) - - -  Calc LDL <100 mg/dL - 127(H) - - -  Triglycerides <150 mg/dL - 230(H) - - -  Creatinine 0.70 - 1.18 mg/dL - 1.11 0.99 0.97 1.00   BP/Weight 01/25/2017 09/22/2016 08/05/2016 07/26/2016 86/12/6718  Systolic BP 947 096 283 662 947  Diastolic BP 68 80 80 80 70  Wt. (Lbs) 249.6 252 256 259 253.8  BMI 32.93 33.25 33.78 34.17 33.49  A1c is 6.6  Alex Edwards  reports that he quit smoking about 3 years ago. His smoking use included cigarettes. He has  a 50.00 pack-year smoking history. he has never used smokeless tobacco. He reports that he does not drink alcohol or use drugs.     Assessment & Plan:    Statin intolerance  Psoriasis  Obesity (BMI 30-39.9)  Non-seasonal allergic rhinitis due to pollen  Controlled type 2 diabetes mellitus without complication, without long-term current use of insulin (HCC)  Hyperlipidemia associated with type 2 diabetes mellitus ([place])  Hypertension associated with diabetes ([place])  Macular degeneration, unspecified laterality, unspecified type    1. Rx changes: Losartan 50 mg 2. Education: Reviewed 'ABCs' of diabetes management (respective goals in parentheses):  A1C (<7), blood pressure (<130/80), and cholesterol (LDL <100). 3. Compliance at present is estimated to be fair. Efforts to improve compliance (if necessary) will be directed at Continue with present medication and exercise regimen. 4. Follow up: 4 months  Encouraged him to call his ophthalmologist for follow-up appointment.

## 2017-07-24 ENCOUNTER — Other Ambulatory Visit: Payer: Self-pay | Admitting: Family Medicine

## 2017-07-24 DIAGNOSIS — I10 Essential (primary) hypertension: Secondary | ICD-10-CM

## 2017-07-25 NOTE — Telephone Encounter (Signed)
Per your last note pt was to change medicine from metoprolol tartrate 50 mg to losartan 50mg .  Pt pharmacy sent over refil request or the metoprolol. Please advise. Thanks [employer]

## 2017-09-26 ENCOUNTER — Ambulatory Visit (INDEPENDENT_AMBULATORY_CARE_PROVIDER_SITE_OTHER): Payer: Medicare Other | Admitting: Family Medicine

## 2017-09-26 ENCOUNTER — Encounter: Payer: Self-pay | Admitting: Family Medicine

## 2017-09-26 VITALS — BP 138/88 | HR 73 | Wt 252.6 lb

## 2017-09-26 DIAGNOSIS — J301 Allergic rhinitis due to pollen: Secondary | ICD-10-CM | POA: Diagnosis not present

## 2017-09-26 DIAGNOSIS — E669 Obesity, unspecified: Secondary | ICD-10-CM | POA: Diagnosis not present

## 2017-09-26 DIAGNOSIS — B351 Tinea unguium: Secondary | ICD-10-CM | POA: Insufficient documentation

## 2017-09-26 DIAGNOSIS — Z789 Other specified health status: Secondary | ICD-10-CM | POA: Diagnosis not present

## 2017-09-26 DIAGNOSIS — Z720 Tobacco use: Secondary | ICD-10-CM | POA: Diagnosis not present

## 2017-09-26 DIAGNOSIS — E1169 Type 2 diabetes mellitus with other specified complication: Secondary | ICD-10-CM

## 2017-09-26 DIAGNOSIS — E1159 Type 2 diabetes mellitus with other circulatory complications: Secondary | ICD-10-CM

## 2017-09-26 DIAGNOSIS — H3530 Unspecified macular degeneration: Secondary | ICD-10-CM | POA: Diagnosis not present

## 2017-09-26 DIAGNOSIS — E785 Hyperlipidemia, unspecified: Secondary | ICD-10-CM

## 2017-09-26 DIAGNOSIS — I10 Essential (primary) hypertension: Secondary | ICD-10-CM | POA: Diagnosis not present

## 2017-09-26 DIAGNOSIS — E119 Type 2 diabetes mellitus without complications: Secondary | ICD-10-CM

## 2017-09-26 DIAGNOSIS — I152 Hypertension secondary to endocrine disorders: Secondary | ICD-10-CM

## 2017-09-26 NOTE — Progress Notes (Signed)
  Subjective:    Patient ID: Alex Edwards, male    DOB: 1935-07-27, 82 y.o.   MRN: [id number]  Alex Edwards is a 82 y.o. male who presents for follow-up of Type 2 diabetes mellitus.  Patient is checking home blood sugars.   Home blood sugar records: record by meter How often is blood sugars being checked: once a day Current symptoms/problems include none and have been unchanged. Daily foot checks: yes   Any foot concerns: no Last eye exam: over a year Exercise: one hour a day walking He continues on Cozaar and metoprolol and doing well on that.   loops of Cozaar.  His allergies seem to be under good control.  He does continue to smoke and at this point is not interested in quitting.  He does have macular degeneration and plans to follow-up with Dr. Carolynn Sayers concerning this. The following portions of the patient's history were reviewed and updated as appropriate: allergies, current medications, past medical history, past social history and problem list.  ROS as in subjective above.     Objective:    Physical Exam Alert and in no distress otherwise not examined.  Foot exam done. Lab Review Diabetic Labs Latest Ref Rng & Units 05/25/2017 01/25/2017 07/26/2016 04/06/2014 02/15/2014  HbA1c - 6.6 6.5 6.7(H) - -  Microalbumin mg/L - 13.0 - - -  Micro/Creat Ratio - - 13.0 - - -  Chol <200 mg/dL - - 209(H) - -  HDL >40 mg/dL - - 36(L) - -  Calc LDL <100 mg/dL - - 127(H) - -  Triglycerides <150 mg/dL - - 230(H) - -  Creatinine 0.70 - 1.18 mg/dL - - 1.11 0.99 0.97   BP/Weight 05/25/2017 01/25/2017 09/22/2016 08/05/2016 2/33/0076  Systolic BP 226 333 545 625 638  Diastolic BP 70 68 80 80 80  Wt. (Lbs) 251.6 249.6 252 256 259  BMI 33.19 32.93 33.25 33.78 34.17   A1c is 6.8 Alex Edwards  reports that he has been smoking cigarettes.  He has a 50.00 pack-year smoking history. He has never used smokeless tobacco. He reports that he does not drink alcohol or use drugs.     Assessment & Plan:      1. Rx changes: none 2. Education: Reviewed 'ABCs' of diabetes management (respective goals in parentheses):  A1C (<7), blood pressure (<130/80), and cholesterol (LDL <100). 3. Compliance at present is estimated to be good. Efforts to improve compliance (if necessary) will be directed at increased exercise. 4. Follow up: 4 months Follow-up colonoscopy discussed with him however he is not interested. Also discussed treatment of the onychomycosis and at this point he is not interested.  We also discussed smoking cessation and again it is not a high priority for him.

## 2017-09-27 LAB — CBC WITH DIFFERENTIAL/PLATELET
BASOS: 0 %
Basophils Absolute: 0 10*3/uL (ref 0.0–0.2)
EOS (ABSOLUTE): 0.2 10*3/uL (ref 0.0–0.4)
EOS: 4 %
HEMATOCRIT: 46.5 % (ref 37.5–51.0)
Hemoglobin: 15.7 g/dL (ref 13.0–17.7)
IMMATURE GRANS (ABS): 0 10*3/uL (ref 0.0–0.1)
IMMATURE GRANULOCYTES: 1 %
LYMPHS: 34 %
Lymphocytes Absolute: 2.2 10*3/uL (ref 0.7–3.1)
MCH: 31.3 pg (ref 26.6–33.0)
MCHC: 33.8 g/dL (ref 31.5–35.7)
MCV: 93 fL (ref 79–97)
MONOS ABS: 0.8 10*3/uL (ref 0.1–0.9)
Monocytes: 12 %
NEUTROS PCT: 49 %
Neutrophils Absolute: 3.3 10*3/uL (ref 1.4–7.0)
Platelets: 249 10*3/uL (ref 150–379)
RBC: 5.02 x10E6/uL (ref 4.14–5.80)
RDW: 13.4 % (ref 12.3–15.4)
WBC: 6.6 10*3/uL (ref 3.4–10.8)

## 2017-09-27 LAB — COMPREHENSIVE METABOLIC PANEL
ALT: 24 IU/L (ref 0–44)
AST: 20 IU/L (ref 0–40)
Albumin/Globulin Ratio: 1.8 (ref 1.2–2.2)
Albumin: 4.6 g/dL (ref 3.5–4.8)
Alkaline Phosphatase: 66 IU/L (ref 39–117)
BUN/Creatinine Ratio: 13 (ref 10–24)
BUN: 14 mg/dL (ref 8–27)
Bilirubin Total: 0.5 mg/dL (ref 0.0–1.2)
CALCIUM: 9.9 mg/dL (ref 8.6–10.2)
CO2: 25 mmol/L (ref 20–29)
CREATININE: 1.11 mg/dL (ref 0.76–1.27)
Chloride: 101 mmol/L (ref 96–106)
GFR, EST AFRICAN AMERICAN: 73 mL/min/{1.73_m2} (ref >59)
GFR, EST NON AFRICAN AMERICAN: 63 mL/min/{1.73_m2} (ref >59)
GLOBULIN, TOTAL: 2.5 g/dL (ref 1.5–4.5)
Glucose: 91 mg/dL (ref 65–99)
Potassium: 4.9 mmol/L (ref 3.5–5.2)
Sodium: 141 mmol/L (ref 134–144)
TOTAL PROTEIN: 7.1 g/dL (ref 6.0–8.5)

## 2017-09-27 LAB — LIPID PANEL
CHOL/HDL RATIO: 6.2 ratio — AB (ref 0.0–5.0)
Cholesterol, Total: 199 mg/dL (ref 100–199)
HDL: 32 mg/dL — AB (ref >39)
LDL CALC: 110 mg/dL — AB (ref 0–99)
Triglycerides: 283 mg/dL — ABNORMAL HIGH (ref 0–149)
VLDL CHOLESTEROL CAL: 57 mg/dL — AB (ref 5–40)

## 2018-01-17 ENCOUNTER — Other Ambulatory Visit: Payer: Self-pay | Admitting: Family Medicine

## 2018-01-17 DIAGNOSIS — I10 Essential (primary) hypertension: Secondary | ICD-10-CM

## 2018-01-31 ENCOUNTER — Ambulatory Visit (INDEPENDENT_AMBULATORY_CARE_PROVIDER_SITE_OTHER): Payer: Medicare Other | Admitting: Family Medicine

## 2018-01-31 VITALS — BP 138/74 | HR 73 | Temp 98.0°F | Resp 16 | Ht 73.0 in | Wt 253.4 lb

## 2018-01-31 DIAGNOSIS — I10 Essential (primary) hypertension: Secondary | ICD-10-CM

## 2018-01-31 DIAGNOSIS — E1159 Type 2 diabetes mellitus with other circulatory complications: Secondary | ICD-10-CM

## 2018-01-31 DIAGNOSIS — Z789 Other specified health status: Secondary | ICD-10-CM

## 2018-01-31 DIAGNOSIS — E119 Type 2 diabetes mellitus without complications: Secondary | ICD-10-CM | POA: Diagnosis not present

## 2018-01-31 DIAGNOSIS — L409 Psoriasis, unspecified: Secondary | ICD-10-CM

## 2018-01-31 DIAGNOSIS — E1169 Type 2 diabetes mellitus with other specified complication: Secondary | ICD-10-CM

## 2018-01-31 DIAGNOSIS — E669 Obesity, unspecified: Secondary | ICD-10-CM | POA: Diagnosis not present

## 2018-01-31 DIAGNOSIS — E785 Hyperlipidemia, unspecified: Secondary | ICD-10-CM

## 2018-01-31 LAB — POCT GLYCOSYLATED HEMOGLOBIN (HGB A1C): HEMOGLOBIN A1C: 6.3 % — AB (ref 4.0–5.6)

## 2018-01-31 MED ORDER — TRIAMCINOLONE ACETONIDE 0.1 % EX CREA: 1.0000 "application " | TOPICAL_CREAM | Freq: Two times a day (BID) | CUTANEOUS | 0 refills | Status: DC

## 2018-01-31 NOTE — Progress Notes (Signed)
  Subjective:    Patient ID: Alex Edwards, male    DOB: 1935-12-28, 82 y.o.   MRN: [id number]  Alex Edwards is a 82 y.o. male who presents for follow-up of Type 2 diabetes mellitus.  Patient is checking home blood sugars.   Home blood sugar records: 90-117  How often is blood sugars being checked: 1 daily Current symptoms/problems include none and have been unchanged. Daily foot checks: yes   Any foot concerns: no Last eye exam: one year Exercise: Home exercise routine includes walking one hrs per day. He is statin intolerant.  He does continue on metoprolol and losartan and having no difficulty with them.  He would like a refill on his cream to help with the psoriasis The following portions of the patient's history were reviewed and updated as appropriate: allergies, current medications, past medical history, past social history and problem list.  ROS as in subjective above.     Objective:    Physical Exam Alert and in no distress otherwise not examined.   Lab Review Diabetic Labs Latest Ref Rng & Units 09/26/2017 05/25/2017 01/25/2017 07/26/2016 04/06/2014  HbA1c - - 6.6 6.5 6.7(H) -  Microalbumin mg/L - - 13.0 - -  Micro/Creat Ratio - - - 13.0 - -  Chol 100 - 199 mg/dL 199 - - 209(H) -  HDL >39 mg/dL 32(L) - - 36(L) -  Calc LDL 0 - 99 mg/dL 110(H) - - 127(H) -  Triglycerides 0 - 149 mg/dL 283(H) - - 230(H) -  Creatinine 0.76 - 1.27 mg/dL 1.11 - - 1.11 0.99   BP/Weight 01/31/2018 09/26/2017 05/25/2017 01/25/2017 07/07/3157  Systolic BP 458 592 924 462 863  Diastolic BP 74 88 70 68 80  Wt. (Lbs) 253.4 252.6 251.6 249.6 252  BMI 33.43 33.33 33.19 32.93 33.25   Foot/eye exam completion dates 09/26/2017  Foot Form Completion Done  A1c is 6.3 Alex Edwards  reports that he has been smoking cigarettes.  He has a 50.00 pack-year smoking history. He has never used smokeless tobacco. He reports that he does not drink alcohol or use drugs.     Assessment & Plan:    Controlled type 2  diabetes mellitus without complication, without long-term current use of insulin (HCC) - Plan: HgB A1c  Psoriasis - Plan: triamcinolone cream (KENALOG) 0.1 %  Statin intolerance  Obesity (BMI 30-39.9)  Hypertension associated with diabetes ([place])  Hyperlipidemia associated with type 2 diabetes mellitus ([place])   1. Rx changes: none 2. Education: Reviewed 'ABCs' of diabetes management (respective goals in parentheses):  A1C (<7), blood pressure (<130/80), and cholesterol (LDL <100). 3. Compliance at present is estimated to be good. Efforts to improve compliance (if necessary) will be directed at increased exercise. 4. Follow up: 4 months Discussed getting the shingles vaccine but he is adamantly opposed to that.

## 2018-02-14 ENCOUNTER — Ambulatory Visit (INDEPENDENT_AMBULATORY_CARE_PROVIDER_SITE_OTHER): Payer: Medicare Other | Admitting: Family Medicine

## 2018-02-14 ENCOUNTER — Ambulatory Visit
Admission: RE | Admit: 2018-02-14 | Discharge: 2018-02-14 | Disposition: A | Discharge status: Home / Self Care | Payer: Medicare Other | Source: Ambulatory Visit | Attending: Family Medicine | Admitting: Family Medicine

## 2018-02-14 ENCOUNTER — Encounter: Payer: Self-pay | Admitting: Family Medicine

## 2018-02-14 VITALS — BP 132/76 | HR 88 | Temp 97.9°F | Wt 246.2 lb

## 2018-02-14 DIAGNOSIS — E119 Type 2 diabetes mellitus without complications: Secondary | ICD-10-CM | POA: Diagnosis not present

## 2018-02-14 DIAGNOSIS — R05 Cough: Secondary | ICD-10-CM | POA: Diagnosis not present

## 2018-02-14 DIAGNOSIS — R0602 Shortness of breath: Secondary | ICD-10-CM

## 2018-02-14 DIAGNOSIS — I700 Atherosclerosis of aorta: Secondary | ICD-10-CM

## 2018-02-14 DIAGNOSIS — Z720 Tobacco use: Secondary | ICD-10-CM | POA: Diagnosis not present

## 2018-02-14 DIAGNOSIS — R059 Cough, unspecified: Secondary | ICD-10-CM

## 2018-02-14 LAB — CBC WITH DIFFERENTIAL/PLATELET
BASOS: 0 %
Basophils Absolute: 0 10*3/uL (ref 0.0–0.2)
EOS (ABSOLUTE): 0 10*3/uL (ref 0.0–0.4)
Eos: 0 %
Hematocrit: 41.7 % (ref 37.5–51.0)
Hemoglobin: 14.2 g/dL (ref 13.0–17.7)
LYMPHS ABS: 1.2 10*3/uL (ref 0.7–3.1)
Lymphs: 9 %
MCH: 31.5 pg (ref 26.6–33.0)
MCHC: 34.1 g/dL (ref 31.5–35.7)
MCV: 93 fL (ref 79–97)
MONOS ABS: 1.1 10*3/uL — AB (ref 0.1–0.9)
Monocytes: 8 %
NEUTROS ABS: 10.6 10*3/uL — AB (ref 1.4–7.0)
NEUTROS PCT: 83 %
PLATELETS: 241 10*3/uL (ref 150–450)
RBC: 4.51 x10E6/uL (ref 4.14–5.80)
RDW: 13 % (ref 12.3–15.4)
WBC: 12.9 10*3/uL — ABNORMAL HIGH (ref 3.4–10.8)

## 2018-02-14 MED ORDER — LEVOFLOXACIN 500 MG PO TABS: 500.0000 mg | ORAL_TABLET | Freq: Every day | ORAL | 0 refills | Status: DC

## 2018-02-14 NOTE — Progress Notes (Signed)
   Subjective:    Patient ID: Alex Edwards, male    DOB: Dec 07, 1935, 82 y.o.   MRN: [id number]  HPI Last Friday he noted the onset of chest congestion with cough, sore throat and temperature of 100.5 with some shortness of breath and fatigue.  The symptoms of continued and he is now having some rib pain from coughing. He continues to smoke but not in the last several days.  Does have an underlying history of diabetes  Review of Systems     Objective:   Physical Exam Alert and tachypneic.  Pulse ox 91 tympanic membranes and canals are normal. Pharyngeal area is normal. Neck is supple without adenopathy or thyromegaly. Cardiac exam shows a regular sinus rhythm without murmurs or gallops. Lungs show scattered rales        Assessment & Plan:  Cough - Plan: CBC with Differential/Platelet, DG Chest 2 View, levofloxacin (LEVAQUIN) 500 MG tablet  SOB (shortness of breath) - Plan: CBC with Differential/Platelet, DG Chest 2 View, levofloxacin (LEVAQUIN) 500 MG tablet  Current occasional smoker  Controlled type 2 diabetes mellitus without complication, without long-term current use of insulin (HCC)  Aortic atherosclerosis (HCC) Stat CBC and chest x-ray was ordered. The x-ray shows evidence of aortic atherosclerosis but no pneumonia.  He will start on the Levaquin and I instructed him to call me tomorrow.

## 2018-02-15 ENCOUNTER — Telehealth: Payer: Self-pay | Admitting: Family Medicine

## 2018-02-15 NOTE — Telephone Encounter (Signed)
Pt called to let Dr. Redmond School know that he is feeling a little better and he will continue to take the meds he was given

## 2018-04-17 ENCOUNTER — Other Ambulatory Visit (INDEPENDENT_AMBULATORY_CARE_PROVIDER_SITE_OTHER): Payer: Medicare Other

## 2018-04-17 DIAGNOSIS — Z23 Encounter for immunization: Secondary | ICD-10-CM

## 2018-04-25 ENCOUNTER — Other Ambulatory Visit: Payer: Self-pay | Admitting: Family Medicine

## 2018-04-25 DIAGNOSIS — I10 Essential (primary) hypertension: Secondary | ICD-10-CM

## 2018-05-09 ENCOUNTER — Encounter: Payer: Self-pay | Admitting: Medical

## 2018-05-09 ENCOUNTER — Ambulatory Visit (INDEPENDENT_AMBULATORY_CARE_PROVIDER_SITE_OTHER): Payer: Medicare Other | Admitting: Medical

## 2018-05-09 VITALS — BP 140/80 | HR 89 | Temp 98.0°F | Resp 18 | Ht 73.0 in | Wt 254.6 lb

## 2018-05-09 DIAGNOSIS — B356 Tinea cruris: Secondary | ICD-10-CM

## 2018-05-09 MED ORDER — KETOCONAZOLE 2 % EX CREA: 1.0000 "application " | TOPICAL_CREAM | Freq: Every day | CUTANEOUS | 0 refills | Status: DC

## 2018-05-09 NOTE — Progress Notes (Signed)
Subjective: Chief Complaint  Patient presents with  . rash    left side rash groin X sunday   Here for rash on left side of groin x 3 + days.   Not very itchy.  Hasn't used any particular treatment so far.  He notes hx/o staph infection in past for doxycycline.   Has also been on cipro in past for other infection.  No fever, no other rash.  No sick contact with similar.  No other aggravating or relieving factors. No other complaint.  Past Medical History:  Diagnosis Date  . BCE (basal cell epithelioma)   . Bruises easily    on hands  . Cough    smokers cough or perfurmes  . Degeneration macular    right eye  . Dyslipidemia   . ED (erectile dysfunction)   . Hx of adenomatous colonic polyps   . Hyperlipidemia   . Hypertension    borderline  . Obesity   . Psoriasis   . Seasonal allergies   . Smoker    former   Current Outpatient Medications on File Prior to Visit  Medication Sig Dispense Refill  . Ascorbic Acid (VITAMIN C) 1000 MG tablet Take 1,000 mg by mouth daily.    . Cranberry-Vitamin C ([employer] CRANBERRY) 84-20 MG CAPS Take 2 tablets by mouth daily.     . fexofenadine (ALLEGRA) 180 MG tablet Take 180 mg by mouth daily.    Marland Kitchen glucose blood test strip One touch verio  Patient is to test one time a day E11.9 100 each 4  . losartan (COZAAR) 50 MG tablet Take 1 tablet (50 mg total) by mouth daily. 90 tablet 3  . metoprolol tartrate (LOPRESSOR) 50 MG tablet TAKE 1 TABLET BY MOUTH TWICE DAILY. 180 tablet 0  . Multiple Vitamin (MULTI-VITAMIN PO) Take 1 tablet by mouth.    [place] Patient is to test one time a day DX: E11.9 100 each 4  . Polyethyl Glycol-Propyl Glycol (SYSTANE) 0.4-0.3 % GEL ophthalmic gel Place 1 application into both eyes.    Marland Kitchen triamcinolone cream (KENALOG) 0.1 % Apply 1 application topically 2 (two) times daily. 453.6 g 0   No current facility-administered medications on file prior to visit.    ROS as in subjective   Objective: BP  140/80   Pulse 89   Temp 98 F (36.7 C) (Oral)   Resp 18   Ht 6\' 1"  (1.854 m)   Wt 254 lb 9.6 oz (115.5 kg)   SpO2 95%   BMI 33.59 kg/m   Wt Readings from Last 3 Encounters:  05/09/18 254 lb 9.6 oz (115.5 kg)  02/14/18 246 lb 3.2 oz (111.7 kg)  01/31/18 253 lb 6.4 oz (114.9 kg)    Gen: wd, wn, nad Beefy red rash contiguous along left scrotum and left groin inter trigonous area.  No induration, no fluctuance, no warmth.    Assessment: Encounter Diagnosis  Name Primary?  . Tinea cruris Yes     Plan: We discussed the symptoms, exam findings, treatment that may take 1 to 2 weeks.  Advise if any worse symptoms in the meantime suggestive of secondary bacterial infection or just not responding to treatment in general then call or return  Follow-up next month for physical Dr. Redmond School as planned.  Alex Edwards was seen today for rash.  Diagnoses and all orders for this visit:  Tinea cruris  Other orders -     ketoconazole (NIZORAL) 2 % cream; Apply  1 application topically daily.

## 2018-05-22 ENCOUNTER — Other Ambulatory Visit: Payer: Self-pay | Admitting: Family Medicine

## 2018-05-22 DIAGNOSIS — I10 Essential (primary) hypertension: Principal | ICD-10-CM

## 2018-05-22 DIAGNOSIS — E1159 Type 2 diabetes mellitus with other circulatory complications: Secondary | ICD-10-CM

## 2018-06-05 ENCOUNTER — Encounter: Payer: Self-pay | Admitting: Family Medicine

## 2018-06-05 ENCOUNTER — Ambulatory Visit (INDEPENDENT_AMBULATORY_CARE_PROVIDER_SITE_OTHER): Payer: Medicare Other | Admitting: Family Medicine

## 2018-06-05 VITALS — BP 128/72 | HR 73 | Temp 97.8°F | Wt 253.8 lb

## 2018-06-05 DIAGNOSIS — E669 Obesity, unspecified: Secondary | ICD-10-CM

## 2018-06-05 DIAGNOSIS — Z720 Tobacco use: Secondary | ICD-10-CM

## 2018-06-05 DIAGNOSIS — J301 Allergic rhinitis due to pollen: Secondary | ICD-10-CM

## 2018-06-05 DIAGNOSIS — E1169 Type 2 diabetes mellitus with other specified complication: Secondary | ICD-10-CM | POA: Diagnosis not present

## 2018-06-05 DIAGNOSIS — E1159 Type 2 diabetes mellitus with other circulatory complications: Secondary | ICD-10-CM | POA: Diagnosis not present

## 2018-06-05 DIAGNOSIS — Z789 Other specified health status: Secondary | ICD-10-CM

## 2018-06-05 DIAGNOSIS — I10 Essential (primary) hypertension: Secondary | ICD-10-CM

## 2018-06-05 DIAGNOSIS — I152 Hypertension secondary to endocrine disorders: Secondary | ICD-10-CM

## 2018-06-05 DIAGNOSIS — E119 Type 2 diabetes mellitus without complications: Secondary | ICD-10-CM

## 2018-06-05 DIAGNOSIS — E785 Hyperlipidemia, unspecified: Secondary | ICD-10-CM

## 2018-06-05 LAB — POCT GLYCOSYLATED HEMOGLOBIN (HGB A1C): Hemoglobin A1C: 6.5 % — AB (ref 4.0–5.6)

## 2018-06-05 NOTE — Progress Notes (Signed)
  Subjective:    Patient ID: Alex Edwards, male    DOB: 1935-07-28, 81 y.o.   MRN: [id number]  Alex Edwards is a 82 y.o. male who presents for follow-up of Type 2 diabetes mellitus.  Patient is checking home blood sugars.   Home blood sugar records: meter records How often is blood sugars being checked: 110-115 qd fasting q week 2 hrs post meal Current symptoms/problems include none and have been unchanged. Daily foot checks: yes   Any foot concerns: none Last eye exam: Dr. Katy Fitch sept of 2020 Exercise: 45 min.- 1 hour walking He continues to intermittently smoke.  His allergies are fairly well controlled and he is not interested in any more medications for that.  He continues on losartan, metoprolol and is doing well on that.  He is statin intolerant.  He has tried multiple meds in the past all causing myalgias.  He keeps himself physically active. The following portions of the patient's history were reviewed and updated as appropriate: allergies, current medications, past medical history, past social history and problem list.  ROS as in subjective above.     Objective:    Physical Exam Alert and in no distress otherwise not examined.  Lab Review Diabetic Labs Latest Ref Rng & Units 01/31/2018 09/26/2017 05/25/2017 01/25/2017 07/26/2016  HbA1c 4.0 - 5.6 % 6.3(A) - 6.6 6.5 6.7(H)  Microalbumin mg/L - - - 13.0 -  Micro/Creat Ratio - - - - 13.0 -  Chol 100 - 199 mg/dL - 199 - - 209(H)  HDL >39 mg/dL - 32(L) - - 36(L)  Calc LDL 0 - 99 mg/dL - 110(H) - - 127(H)  Triglycerides 0 - 149 mg/dL - 283(H) - - 230(H)  Creatinine 0.76 - 1.27 mg/dL - 1.11 - - 1.11   BP/Weight 05/09/2018 02/14/2018 01/31/2018 09/26/2017 63/78/5885  Systolic BP 027 741 287 867 672  Diastolic BP 80 76 74 88 70  Wt. (Lbs) 254.6 246.2 253.4 252.6 251.6  BMI 33.59 32.48 33.43 33.33 33.19   Foot/eye exam completion dates 09/26/2017  Foot Form Completion Done  A1c is 6.5  Alex Edwards  reports that he has been smoking  cigarettes. He has a 50.00 pack-year smoking history. He has never used smokeless tobacco. He reports that he does not drink alcohol or use drugs.     Assessment & Plan:    Controlled type 2 diabetes mellitus without complication, without long-term current use of insulin (HCC) - Plan: POCT glycosylated hemoglobin (Hb A1C)  Statin intolerance  Hyperlipidemia associated with type 2 diabetes mellitus ([place])  Hypertension associated with diabetes ([place])  Obesity (BMI 30-39.9)   1. Rx changes: none 2. Education: Reviewed 'ABCs' of diabetes management (respective goals in parentheses):  A1C (<7), blood pressure (<130/80), and cholesterol (LDL <100). 3. Compliance at present is estimated to be excellent. Efforts to improve compliance (if necessary) will be directed at maintaining present course 4. Follow up: 4 months I discussed his smoking cessation with him and he is not interested in entirely quitting.

## 2018-07-21 ENCOUNTER — Other Ambulatory Visit: Payer: Self-pay | Admitting: Family Medicine

## 2018-07-21 DIAGNOSIS — I10 Essential (primary) hypertension: Secondary | ICD-10-CM

## 2018-08-21 ENCOUNTER — Other Ambulatory Visit: Payer: Self-pay | Admitting: Family Medicine

## 2018-08-21 DIAGNOSIS — E1159 Type 2 diabetes mellitus with other circulatory complications: Secondary | ICD-10-CM

## 2018-08-21 DIAGNOSIS — I10 Essential (primary) hypertension: Principal | ICD-10-CM

## 2018-10-12 ENCOUNTER — Other Ambulatory Visit: Payer: Self-pay | Admitting: Family Medicine

## 2018-10-12 ENCOUNTER — Ambulatory Visit: Payer: Medicare Other | Admitting: Family Medicine

## 2018-10-12 DIAGNOSIS — I10 Essential (primary) hypertension: Secondary | ICD-10-CM

## 2018-10-12 DIAGNOSIS — L409 Psoriasis, unspecified: Secondary | ICD-10-CM

## 2018-10-12 NOTE — Telephone Encounter (Signed)
Alex Edwards is requesting to fill pt triamcinoloe cream Please advise [facility]

## 2018-10-31 ENCOUNTER — Other Ambulatory Visit: Payer: Self-pay | Admitting: Family Medicine

## 2018-11-09 ENCOUNTER — Other Ambulatory Visit: Payer: Self-pay | Admitting: Family Medicine

## 2018-11-09 DIAGNOSIS — E1159 Type 2 diabetes mellitus with other circulatory complications: Secondary | ICD-10-CM

## 2018-11-09 DIAGNOSIS — I152 Hypertension secondary to endocrine disorders: Secondary | ICD-10-CM

## 2019-01-02 ENCOUNTER — Other Ambulatory Visit: Payer: Self-pay | Admitting: Family Medicine

## 2019-01-02 DIAGNOSIS — I10 Essential (primary) hypertension: Secondary | ICD-10-CM

## 2019-01-24 ENCOUNTER — Encounter: Payer: Self-pay | Admitting: Family Medicine

## 2019-01-24 ENCOUNTER — Other Ambulatory Visit: Payer: Self-pay

## 2019-01-24 ENCOUNTER — Ambulatory Visit (INDEPENDENT_AMBULATORY_CARE_PROVIDER_SITE_OTHER): Payer: Medicare Other | Admitting: Family Medicine

## 2019-01-24 VITALS — BP 138/84 | HR 82 | Temp 98.4°F | Ht 73.0 in | Wt 257.4 lb

## 2019-01-24 DIAGNOSIS — I700 Atherosclerosis of aorta: Secondary | ICD-10-CM

## 2019-01-24 DIAGNOSIS — Z86010 Personal history of colonic polyps: Secondary | ICD-10-CM

## 2019-01-24 DIAGNOSIS — E1159 Type 2 diabetes mellitus with other circulatory complications: Secondary | ICD-10-CM

## 2019-01-24 DIAGNOSIS — Z789 Other specified health status: Secondary | ICD-10-CM | POA: Diagnosis not present

## 2019-01-24 DIAGNOSIS — J301 Allergic rhinitis due to pollen: Secondary | ICD-10-CM

## 2019-01-24 DIAGNOSIS — D692 Other nonthrombocytopenic purpura: Secondary | ICD-10-CM

## 2019-01-24 DIAGNOSIS — I152 Hypertension secondary to endocrine disorders: Secondary | ICD-10-CM

## 2019-01-24 DIAGNOSIS — E1169 Type 2 diabetes mellitus with other specified complication: Secondary | ICD-10-CM

## 2019-01-24 DIAGNOSIS — E785 Hyperlipidemia, unspecified: Secondary | ICD-10-CM

## 2019-01-24 DIAGNOSIS — E119 Type 2 diabetes mellitus without complications: Secondary | ICD-10-CM

## 2019-01-24 DIAGNOSIS — Z0000 Encounter for general adult medical examination without abnormal findings: Secondary | ICD-10-CM

## 2019-01-24 DIAGNOSIS — I10 Essential (primary) hypertension: Secondary | ICD-10-CM

## 2019-01-24 DIAGNOSIS — Z9849 Cataract extraction status, unspecified eye: Secondary | ICD-10-CM

## 2019-01-24 DIAGNOSIS — R609 Edema, unspecified: Secondary | ICD-10-CM

## 2019-01-24 DIAGNOSIS — Z860101 Personal history of adenomatous and serrated colon polyps: Secondary | ICD-10-CM

## 2019-01-24 DIAGNOSIS — E669 Obesity, unspecified: Secondary | ICD-10-CM

## 2019-01-24 DIAGNOSIS — E1121 Type 2 diabetes mellitus with diabetic nephropathy: Secondary | ICD-10-CM

## 2019-01-24 LAB — POCT GLYCOSYLATED HEMOGLOBIN (HGB A1C): Hemoglobin A1C: 6.6 % — AB (ref 4.0–5.6)

## 2019-01-24 MED ORDER — ATORVASTATIN CALCIUM 10 MG PO TABS: ORAL_TABLET | ORAL | 3 refills | Status: DC

## 2019-01-24 MED ORDER — LOSARTAN POTASSIUM 50 MG PO TABS: 50.0000 mg | ORAL_TABLET | Freq: Every day | ORAL | 3 refills | Status: DC

## 2019-01-24 MED ORDER — METOPROLOL TARTRATE 50 MG PO TABS: 50.0000 mg | ORAL_TABLET | Freq: Two times a day (BID) | ORAL | 3 refills | Status: DC

## 2019-01-24 NOTE — Patient Instructions (Signed)
  [name] , Thank you for taking time to come for your Medicare Wellness Visit. I appreciate your ongoing commitment to your health goals. Please review the following plan we discussed and let me know if I can assist you in the future.   These are the goals we discussed: Elevate your legs and wear elastic hose  This is a list of the screening recommended for you and due dates:  Health Maintenance  Topic Date Due  . Eye exam for diabetics  10[birth year]01/1947  . Colon Cancer Screening  11/24/2014  . Complete foot exam   09/27/2018  . Hemoglobin A1C  12/05/2018  . Flu Shot  01/27/2019  . Tetanus Vaccine  02/10/2022  . Pneumonia vaccines  Completed

## 2019-01-24 NOTE — Progress Notes (Addendum)
Alex Edwards is a 83 y.o. male who presents for annual wellness visit ,CPE and follow-up on chronic medical conditions.  He has had difficulty recently with swelling of his feet is mainly at the end of the day.  When he wakes up the morning he is doing fine.  He has had no chest pain, shortness of breath, PND or DOE.  He does have underlying allergies and is using OTC meds for that.  He still has some difficulty with that.  He continues on his metoprolol, losartan and is doing well.  He is on no diabetes related medications.   Immunizations and Health Maintenance Immunization History  Administered Date(s) Administered  . Influenza Split 04/22/2009, 07/02/2010, 05/12/2011, 03/28/2012  . Influenza, High Dose Seasonal PF 04/02/2013, 04/23/2014, 04/09/2015, 03/16/2016, 03/29/2017, 04/17/2018  . Pneumococcal Conjugate-13 04/23/2014  . Pneumococcal Polysaccharide-23 03/29/2005  . Td 03/29/2005  . Tdap 02/11/2012   Health Maintenance Due  Topic Date Due  . OPHTHALMOLOGY EXAM  10/01/1945  . COLONOSCOPY  11/24/2014  . FOOT EXAM  09/27/2018  . HEMOGLOBIN A1C  12/05/2018    Last colonoscopy: 10-14-11 Last PSA: unkown Dentist: N/A Ophtho: 11/2017 Exercise: Work out 30 min QD  Other doctors caring for patient include: Dr Ninfa Linden ortho  [facility] [place] interested Does Patient Have a Medical Advance Directive?: No Would patient like information on creating a medical advance directive?: No - Patient declined  Depression screen:  See questionnaire below.     Depression screen [facility] 2/9 01/24/2019 01/31/2018 07/26/2016 12/09/2014 12/26/2013  Decreased Interest 0 0 0 0 0  Down, Depressed, Hopeless 0 0 0 0 0  PHQ - 2 Score 0 0 0 0 0    Fall Screen: See Questionaire below.   Fall Risk  01/24/2019 01/31/2018 07/26/2016 12/09/2014 12/26/2013  Falls in the past year? 0 No No No No    ADL screen:  See questionnaire below.  Functional Status Survey: Is the patient deaf or have difficulty  hearing?: No Does the patient have difficulty seeing, even when wearing glasses/contacts?: No Does the patient have difficulty concentrating, remembering, or making decisions?: No Does the patient have difficulty walking or climbing stairs?: No Does the patient have difficulty dressing or bathing?: No Does the patient have difficulty doing errands alone such as visiting a doctor's office or shopping?: No   Review of Systems  Constitutional: -, -unexpected weight change, -anorexia, -fatigue Allergy: -sneezing, -itching, -congestion Dermatology: denies changing moles, rash, lumps ENT: -runny nose, -ear pain, -sore throat,  Cardiology:  -chest pain, -palpitations, -orthopnea, Respiratory: -cough, -shortness of breath, -dyspnea on exertion, -wheezing,  Gastroenterology: -abdominal pain, -nausea, -vomiting, -diarrhea, -constipation, -dysphagia Hematology: -bleeding or bruising problems Musculoskeletal: -arthralgias, -myalgias, -joint swelling, -back pain, - Ophthalmology: -vision changes,  Urology: -dysuria, -difficulty urinating,  -urinary frequency, -urgency, incontinence Neurology: -, -numbness, , -memory loss, -falls, -dizziness    PHYSICAL EXAM:  General Appearance: Alert, cooperative, no distress, appears stated age Head: Normocephalic, without obvious abnormality, atraumatic Eyes: PERRL, conjunctiva/corneas clear, EOM's intact, fundi benign Ears: Normal TM's and external ear canals Nose: Nares normal, mucosa normal, no drainage or sinus   tenderness Throat: Lips, mucosa, and tongue normal; teeth and gums normal Neck: Supple, no lymphadenopathy, thyroid:no enlargement/tenderness/nodules; no carotid bruit or JVD Lungs: Clear to auscultation bilaterally without wheezes, rales or ronchi; respirations unlabored Heart: Regular rate and rhythm, S1 and S2 normal, no murmur, rub or gallop Abdomen: Soft, non-tender, nondistended, normoactive bowel sounds, no masses, no  hepatosplenomegaly Extremities: No clubbing, cyanosis  slight edema is noted in both lower extremities. Pulses: 2+ and symmetric all extremities Skin: Skin color, texture, turgor normal, purpuric lesions Lymph nodes: Cervical, supraclavicular, and axillary nodes normal Neurologic: CNII-XII intact, normal strength, sensation and gait; reflexes 2+ and symmetric throughout   Psych: Normal mood, affect, hygiene and grooming Hemoglobin A1c is 6.6  ASSESSMENT/PLAN:Routine general medical examination at a health care facility - Plan: CBC with Differential/Platelet, Comprehensive metabolic panel, Lipid panel,   Statin intolerance - Plan: atorvastatin (LIPITOR) 10 MG tablet, I convinced him to take a very low dose of Lipitor 3 days/week.  He will let me know how he does.  Controlled type 2 diabetes mellitus without complication, without long-term current use of insulin ([place]) - Plan: CBC with Differential/Platelet, Comprehensive metabolic panel, Lipid panel, POCT UA - Microalbumin, POCT glycosylated hemoglobin (Hb A1C), continues to do well on no diabetes medications.  Hyperlipidemia associated with type 2 diabetes mellitus ([place]) - Plan: atorvastatin (LIPITOR) 10 MG tablet,   Non-seasonal allergic rhinitis due to pollen - Plan: Add Flonase to his regimen for better control.  Obesity (BMI 30-39.9) - Plan: Continue with diet and exercise  Hypertension associated with diabetes ([place]) - Plan: CBC with Differential/Platelet, Comprehensive metabolic panel, losartan (COZAAR) 50 MG tablet,   Aortic atherosclerosis ([place]) - Plan: Monitor  History of cataract extraction, unspecified laterality -  Senile purpura ([place]) - Plan: No therapy  Hx of adenomatous colonic polyps - Plan: Not interested in follow-up colonoscopy  Essential hypertension - Plan: metoprolol tartrate (LOPRESSOR) 50 MG tablet,  Also recommend elevating his feet as much as possible to help with the dependent edema as well as use support  hose.  He is a/C ratio is now over 30 indicating nephropathy.  , recommended at least 30 minutes of aerobic activity at least 5 days/week;  reviewed; healthy diet and alcohol recommendations (less than or equal to 2 drinks/day) reviewed; regular seatbelt use; changing batteries in smoke detectors. Immunization recommendations discussed.  Colonoscopy recommendations reviewed.   Medicare Attestation I have personally reviewed: The patient's medical and social history Their use of alcohol, tobacco or illicit drugs Their current medications and supplements The patient's functional ability including ADLs,fall risks, home safety risks, cognitive, and hearing and visual impairment Diet and physical activities Evidence for depression or mood disorders  The patient's weight, height, and BMI have been recorded in the chart.  I have made referrals, counseling, and provided education to the patient based on review of the above and I have provided the patient with a written personalized care plan for preventive services.     Jill Alexanders, MD   01/24/2019

## 2019-01-25 LAB — CBC WITH DIFFERENTIAL/PLATELET
Basophils Absolute: 0 10*3/uL (ref 0.0–0.2)
Basos: 1 %
EOS (ABSOLUTE): 0.2 10*3/uL (ref 0.0–0.4)
Eos: 4 %
Hematocrit: 45.7 % (ref 37.5–51.0)
Hemoglobin: 15.2 g/dL (ref 13.0–17.7)
Immature Grans (Abs): 0 10*3/uL (ref 0.0–0.1)
Immature Granulocytes: 1 %
Lymphocytes Absolute: 1.8 10*3/uL (ref 0.7–3.1)
Lymphs: 28 %
MCH: 31.4 pg (ref 26.6–33.0)
MCHC: 33.3 g/dL (ref 31.5–35.7)
MCV: 94 fL (ref 79–97)
Monocytes Absolute: 0.7 10*3/uL (ref 0.1–0.9)
Monocytes: 10 %
Neutrophils Absolute: 3.6 10*3/uL (ref 1.4–7.0)
Neutrophils: 56 %
Platelets: 241 10*3/uL (ref 150–450)
RBC: 4.84 x10E6/uL (ref 4.14–5.80)
RDW: 12.4 % (ref 11.6–15.4)
WBC: 6.3 10*3/uL (ref 3.4–10.8)

## 2019-01-25 LAB — COMPREHENSIVE METABOLIC PANEL
ALT: 36 IU/L (ref 0–44)
AST: 31 IU/L (ref 0–40)
Albumin/Globulin Ratio: 1.7 (ref 1.2–2.2)
Albumin: 4.3 g/dL (ref 3.6–4.6)
Alkaline Phosphatase: 50 IU/L (ref 39–117)
BUN/Creatinine Ratio: 12 (ref 10–24)
BUN: 13 mg/dL (ref 8–27)
Bilirubin Total: 0.6 mg/dL (ref 0.0–1.2)
CO2: 25 mmol/L (ref 20–29)
Calcium: 9.4 mg/dL (ref 8.6–10.2)
Chloride: 102 mmol/L (ref 96–106)
Creatinine, Ser: 1.12 mg/dL (ref 0.76–1.27)
GFR calc Af Amer: 71 mL/min/{1.73_m2} (ref >59)
GFR calc non Af Amer: 61 mL/min/{1.73_m2} (ref >59)
Globulin, Total: 2.5 g/dL (ref 1.5–4.5)
Glucose: 111 mg/dL — ABNORMAL HIGH (ref 65–99)
Potassium: 5 mmol/L (ref 3.5–5.2)
Sodium: 142 mmol/L (ref 134–144)
Total Protein: 6.8 g/dL (ref 6.0–8.5)

## 2019-01-25 LAB — POCT UA - MICROALBUMIN
Albumin/Creatinine Ratio, Urine, POC: 32.6
Creatinine, POC: 164.3 mg/dL
Microalbumin Ur, POC: 53.6 mg/L

## 2019-01-25 LAB — LIPID PANEL
Chol/HDL Ratio: 5.8 ratio — ABNORMAL HIGH (ref 0.0–5.0)
Cholesterol, Total: 184 mg/dL (ref 100–199)
HDL: 32 mg/dL — ABNORMAL LOW (ref >39)
LDL Calculated: 111 mg/dL — ABNORMAL HIGH (ref 0–99)
Triglycerides: 206 mg/dL — ABNORMAL HIGH (ref 0–149)
VLDL Cholesterol Cal: 41 mg/dL — ABNORMAL HIGH (ref 5–40)

## 2019-04-10 ENCOUNTER — Other Ambulatory Visit: Payer: Self-pay

## 2019-04-10 ENCOUNTER — Other Ambulatory Visit (INDEPENDENT_AMBULATORY_CARE_PROVIDER_SITE_OTHER): Payer: Medicare Other

## 2019-04-10 DIAGNOSIS — Z23 Encounter for immunization: Secondary | ICD-10-CM | POA: Diagnosis not present

## 2019-04-13 ENCOUNTER — Encounter: Payer: Self-pay | Admitting: Internal Medicine

## 2019-05-14 ENCOUNTER — Other Ambulatory Visit: Payer: Self-pay

## 2019-05-14 NOTE — Patient Outreach (Signed)
[facility]) Care Management  05/14/2019  Alex Edwards 1936-05-11 [id number]   Medication Adherence call to Mr. [name] [facility] Identifiers Verify spoke with patient he is past due on Atorvastatin 10 mg,patient explain he has stop taking this medication because he was having side effects (leg cramps). Mr. [name] is showing past due under [place].   [facility] Direct Dial [phone]  Fax [phone] [email or web address]

## 2019-08-24 ENCOUNTER — Ambulatory Visit: Payer: Medicare Other | Attending: Internal Medicine

## 2019-08-24 DIAGNOSIS — Z23 Encounter for immunization: Secondary | ICD-10-CM | POA: Insufficient documentation

## 2019-08-24 NOTE — Progress Notes (Signed)
   Covid-19 Vaccination Clinic  Name:  Seabron Iannello    MRN: [id number] DOB: 1936/04/02  08/24/2019  Mr. Ray Sr was observed post Covid-19 immunization for 30 minutes based on pre-vaccination screening without incidence. He was provided with Vaccine Information Sheet and instruction to access the V-Safe system.   Mr. [name] Sr was instructed to call 911 with any severe reactions post vaccine: Marland Kitchen Difficulty breathing  . Swelling of your face and throat  . A fast heartbeat  . A bad rash all over your body  . Dizziness and weakness    Immunizations Administered    Name Date Dose VIS Date Route   Pfizer COVID-19 Vaccine 08/24/2019 11:07 AM 0.3 mL 06/08/2019 Intramuscular   Manufacturer: [place]   Lot: [id number]   [place]: [id number]

## 2019-09-18 ENCOUNTER — Ambulatory Visit: Payer: Medicare Other | Attending: Internal Medicine

## 2019-09-18 DIAGNOSIS — Z23 Encounter for immunization: Secondary | ICD-10-CM

## 2019-09-18 NOTE — Progress Notes (Signed)
   Covid-19 Vaccination Clinic  Name:  Alex Edwards    MRN: [id number] DOB: 11-Jul-1935  09/18/2019  [name] was observed post Covid-19 immunization for 15 minutes without incident. He was provided with Vaccine Information Sheet and instruction to access the V-Safe system.   [name] was instructed to call 911 with any severe reactions post vaccine: Marland Kitchen Difficulty breathing  . Swelling of face and throat  . A fast heartbeat  . A bad rash all over body  . Dizziness and weakness   Immunizations Administered    Name Date Dose VIS Date Route   Pfizer COVID-19 Vaccine 09/18/2019 12:51 PM 0.3 mL 06/08/2019 Intramuscular   Manufacturer: [place]   Lot: [id number]   [place]: [id number]

## 2019-11-21 ENCOUNTER — Other Ambulatory Visit: Payer: Self-pay | Admitting: Family Medicine

## 2019-12-07 ENCOUNTER — Other Ambulatory Visit: Payer: Self-pay | Admitting: Family Medicine

## 2019-12-07 DIAGNOSIS — L409 Psoriasis, unspecified: Secondary | ICD-10-CM

## 2019-12-07 NOTE — Telephone Encounter (Signed)
[facility] is requesting to fill pt triamcinolone. Please advise Kh

## 2019-12-18 ENCOUNTER — Other Ambulatory Visit: Payer: Self-pay | Admitting: Medical

## 2019-12-18 NOTE — Telephone Encounter (Signed)
[facility] is requesting to fill pt ketoconazole. Please advise [facility]

## 2020-01-11 ENCOUNTER — Other Ambulatory Visit: Payer: Self-pay

## 2020-01-11 ENCOUNTER — Encounter: Payer: Self-pay | Admitting: Medical

## 2020-01-11 ENCOUNTER — Ambulatory Visit (INDEPENDENT_AMBULATORY_CARE_PROVIDER_SITE_OTHER): Payer: Medicare Other | Admitting: Medical

## 2020-01-11 VITALS — BP 174/86 | HR 85 | Ht 73.0 in | Wt 253.0 lb

## 2020-01-11 DIAGNOSIS — L089 Local infection of the skin and subcutaneous tissue, unspecified: Secondary | ICD-10-CM | POA: Diagnosis not present

## 2020-01-11 DIAGNOSIS — L729 Follicular cyst of the skin and subcutaneous tissue, unspecified: Secondary | ICD-10-CM

## 2020-01-11 MED ORDER — HYDROCODONE-ACETAMINOPHEN 5-325 MG PO TABS: 1.0000 | ORAL_TABLET | Freq: Four times a day (QID) | ORAL | 0 refills | Status: DC | PRN

## 2020-01-11 MED ORDER — CEPHALEXIN 500 MG PO CAPS: 500.0000 mg | ORAL_CAPSULE | Freq: Three times a day (TID) | ORAL | 0 refills | Status: DC

## 2020-01-11 NOTE — Patient Instructions (Signed)
You were treated today for infected sebaceous cyst  Recommendations  Over the weekend, you can place a warm towel over the area or hot compress to encourage drainage and healing  Try to leave the packing alone until Monday  On Monday pull the packing out in one quick manner  Over the weekend watch for any signs of swelling, worsening pain, worsening redness  If signs of infection over the weekend then begin Keflex antibiotic.  I sent this to the pharmacy for pharmacist to hold  If worse pain over the weekend you can use either Tylenol or the stronger pain pill I sent to the pharmacy  As long as it continues to look improved you do not need to come back  However if you want to come in Monday to let us look at the wound that is okay as well  After Monday when the packing is removed, you can take a shower as normal.  In the meantime though just use a sponge bath till Monday

## 2020-01-11 NOTE — Progress Notes (Signed)
  Subjective:   Alex Edwards is a 84 y.o. male who presents along with his wife for evaluation of a probable cutaneous abscess. Lesion is located in the left upper back. Although the cyst has been there a year, it just got tender and swollen 3 weeks ago and has gradually worsened.  he has had 2 other skin cysts in his back require I&D.  Patient denies hx/o MRSA.  Patient denies hx/o I&D for similar.  Patient does have diabetes.  Patient denies hx/o poor wound healing, compromised immunity or HIV.  No other aggravating or relieving factors.  No other c/o.  Past Medical History:  Diagnosis Date  . BCE (basal cell epithelioma)   . Bruises easily    on hands  . Cough    smokers cough or perfurmes  . Degeneration macular    right eye  . Dyslipidemia   . ED (erectile dysfunction)   . Hx of adenomatous colonic polyps   . Hyperlipidemia   . Hypertension    borderline  . Obesity   . Psoriasis   . Seasonal allergies   . Smoker    former    Reviewed prior allergies, medications, past medical history, past surgical history.  ROS as in subjective   Objective:   Blood pressure (!) 174/86, pulse 85, height 6\' 1"  (1.854 m), weight 253 lb (114.8 kg), SpO2 96 %.  Gen: wd, wn, nad, afebrile Skin: There is an area characterized by a soft mobile subQ mass, a subcutaneous mass consistent with a cutaneous abscess, induration, fluctuance, tenderness measuring 4 cm in greatest dimension. Location: left upper back.   Assessment:     Encounter Diagnosis  Name Primary?  . Infected cyst of skin Yes      Plan:   Discussed examination findings, diagnosis, usual course of illness, and options for therapy discussed. After discussing recommendations, patient agrees to I&D, oral antibiotics.    Procedure Informed consent obtained.  The area was prepped in the usual manner and the skin overlying the abscess was anesthetized with 3cc of 1% lidocaine with epinephrine.  The area was sharply incised  and approx 6-10 ccs of purulent and cheesy material was obtained.  Area was irrigated with high pressure saline. Packing was inserted. Wound was covered with sterile bandage.    We discussed home care of the weekend including warm compress, keeping area clean and dry, Tylenol for pain or if needed can use the hydrocodone does not sent today short-term.  Caution sedation.  However if worse signs of infection of the weekend he can either call after-hours line, get reevaluated or begin the course of oral antibiotics.  Wife can pull out packing on Monday or he can come in and we can pull it out and do a wound check Monday  Follow up: 3 days, Monday.  However, if worse signs of infections as discussed (fever, chills, nausea, vomiting, worsening redness, worsening pain), then call or return immediately.  Alex Edwards was seen today for cyst.  Diagnoses and all orders for this visit:  Infected cyst of skin  Other orders -     cephALEXin (KEFLEX) 500 MG capsule; Take 1 capsule (500 mg total) by mouth 3 (three) times daily. -     HYDROcodone-acetaminophen (NORCO) 5-325 MG tablet; Take 1 tablet by mouth every 6 (six) hours as needed. Hold for patient request

## 2020-01-25 ENCOUNTER — Ambulatory Visit: Payer: Medicare Other | Admitting: Family Medicine

## 2020-01-29 ENCOUNTER — Ambulatory Visit (INDEPENDENT_AMBULATORY_CARE_PROVIDER_SITE_OTHER): Payer: Medicare Other | Admitting: Family Medicine

## 2020-01-29 ENCOUNTER — Encounter: Payer: Self-pay | Admitting: Family Medicine

## 2020-01-29 ENCOUNTER — Other Ambulatory Visit: Payer: Self-pay

## 2020-01-29 VITALS — BP 130/76 | HR 76 | Temp 97.7°F | Ht 73.0 in | Wt 252.0 lb

## 2020-01-29 DIAGNOSIS — Z0000 Encounter for general adult medical examination without abnormal findings: Secondary | ICD-10-CM | POA: Diagnosis not present

## 2020-01-29 DIAGNOSIS — I10 Essential (primary) hypertension: Secondary | ICD-10-CM

## 2020-01-29 DIAGNOSIS — I700 Atherosclerosis of aorta: Secondary | ICD-10-CM

## 2020-01-29 DIAGNOSIS — Z789 Other specified health status: Secondary | ICD-10-CM

## 2020-01-29 DIAGNOSIS — J301 Allergic rhinitis due to pollen: Secondary | ICD-10-CM | POA: Diagnosis not present

## 2020-01-29 DIAGNOSIS — R609 Edema, unspecified: Secondary | ICD-10-CM

## 2020-01-29 DIAGNOSIS — Z720 Tobacco use: Secondary | ICD-10-CM

## 2020-01-29 DIAGNOSIS — H3530 Unspecified macular degeneration: Secondary | ICD-10-CM

## 2020-01-29 DIAGNOSIS — Z96642 Presence of left artificial hip joint: Secondary | ICD-10-CM

## 2020-01-29 DIAGNOSIS — B351 Tinea unguium: Secondary | ICD-10-CM

## 2020-01-29 DIAGNOSIS — D692 Other nonthrombocytopenic purpura: Secondary | ICD-10-CM

## 2020-01-29 DIAGNOSIS — Z9849 Cataract extraction status, unspecified eye: Secondary | ICD-10-CM

## 2020-01-29 DIAGNOSIS — E119 Type 2 diabetes mellitus without complications: Secondary | ICD-10-CM | POA: Diagnosis not present

## 2020-01-29 DIAGNOSIS — E1169 Type 2 diabetes mellitus with other specified complication: Secondary | ICD-10-CM

## 2020-01-29 DIAGNOSIS — E669 Obesity, unspecified: Secondary | ICD-10-CM | POA: Diagnosis not present

## 2020-01-29 DIAGNOSIS — I152 Hypertension secondary to endocrine disorders: Secondary | ICD-10-CM

## 2020-01-29 DIAGNOSIS — E1159 Type 2 diabetes mellitus with other circulatory complications: Secondary | ICD-10-CM | POA: Diagnosis not present

## 2020-01-29 DIAGNOSIS — L409 Psoriasis, unspecified: Secondary | ICD-10-CM

## 2020-01-29 DIAGNOSIS — R220 Localized swelling, mass and lump, head: Secondary | ICD-10-CM

## 2020-01-29 DIAGNOSIS — E785 Hyperlipidemia, unspecified: Secondary | ICD-10-CM

## 2020-01-29 LAB — POCT UA - MICROALBUMIN
Albumin/Creatinine Ratio, Urine, POC: 33.1
Creatinine, POC: 156.3 mg/dL
Microalbumin Ur, POC: 51.8 mg/L

## 2020-01-29 LAB — POCT GLYCOSYLATED HEMOGLOBIN (HGB A1C): Hemoglobin A1C: 6.8 % — AB (ref 4.0–5.6)

## 2020-01-29 NOTE — Progress Notes (Signed)
Alex Edwards is a 84 y.o. male who presents for annual wellness visit,CPE and follow-up on chronic medical conditions.  He has no particular concerns or complaints.  Review of immunizations indicates he needs Shingrix however he is hesitant to get this.  He does have a history of statin intolerance and has also had difficulty with that here.  He has started smoking again and this point is not interested in quitting.  He does complain of some leg swelling mainly in his feet noted especially at the end of the day and does improve when he gets up in the morning.  He has had no chest pain, shortness of breath, swelling in other areas, PND.  He continues on losartan and metoprolol.  His allergies seem to be under good control.  He does have psoriasis but states it is not causing much difficulty.  He keeps himself active but is not involved in a regular exercise program.  He is not having any hip discomfort at the present time.  He does have a lesion in the left submandibular area however it has been stable for the last 20 or 30 years.  He does have underlying diabetes but presently is not on a antidiabetes medication.  He does follow-up regularly with ophthalmology and plans another visit to see Dr. Katy Fitch.  He has had cataract surgery.  He does have onychomycosis but at this time is not on any medications.   Immunizations and Health Maintenance Immunization History  Administered Date(s) Administered  . Fluad Quad(high Dose 65+) 04/10/2019  . Influenza Split 04/22/2009, 07/02/2010, 05/12/2011, 03/28/2012  . Influenza, High Dose Seasonal PF 04/02/2013, 04/23/2014, 04/09/2015, 03/16/2016, 03/29/2017, 04/17/2018  . PFIZER SARS-COV-2 Vaccination 08/24/2019, 09/18/2019  . Pneumococcal Conjugate-13 04/23/2014  . Pneumococcal Polysaccharide-23 03/29/2005  . Td 03/29/2005  . Tdap 02/11/2012   Health Maintenance Due  Topic Date Due  . OPHTHALMOLOGY EXAM  Never done  . COLONOSCOPY  11/24/2014  . HEMOGLOBIN A1C   07/27/2019  . FOOT EXAM  01/24/2020  . INFLUENZA VACCINE  01/27/2020    Last colonoscopy: 11/24/11 Last PSA: N/A Dentist: N/A Ophtho: Yearly Exercise: walking   Other doctors caring for patient include: Lucinda Dell derm Advanced Directives: Does Patient Have a Medical Advance Directive?: No Would patient like information on creating a medical advance directive?: No - Patient declined  Depression screen:  See questionnaire below.     Depression screen [facility] 2/9 01/29/2020 01/24/2019 01/31/2018 07/26/2016 12/09/2014  Decreased Interest 0 0 0 0 0  Down, Depressed, Hopeless 0 0 0 0 0  PHQ - 2 Score 0 0 0 0 0    Fall Screen: See Questionaire below.   Fall Risk  01/29/2020 01/24/2019 01/31/2018 07/26/2016 12/09/2014  Falls in the past year? 0 0 No No No  Risk for fall due to : No Fall Risks - - - -    ADL screen:  See questionnaire below.  Functional Status Survey: Is the patient deaf or have difficulty hearing?: No Does the patient have difficulty seeing, even when wearing glasses/contacts?: No Does the patient have difficulty concentrating, remembering, or making decisions?: No Does the patient have difficulty walking or climbing stairs?: No Does the patient have difficulty dressing or bathing?: No Does the patient have difficulty doing errands alone such as visiting a doctor's office or shopping?: No   Review of Systems  Constitutional: -, -unexpected weight change, -anorexia, -fatigue Allergy: -sneezing, -itching, -congestion Dermatology: denies changing moles, rash, lumps ENT: -runny nose, -ear  pain, -sore throat,  Cardiology:  -chest pain, -palpitations, -orthopnea, Respiratory: -cough, -shortness of breath, -dyspnea on exertion, -wheezing,  Gastroenterology: -abdominal pain, -nausea, -vomiting, -diarrhea, -constipation, -dysphagia Hematology: -bleeding or bruising problems Musculoskeletal: -arthralgias, -myalgias, -joint swelling, -back pain, - Ophthalmology: -vision changes,   Urology: -dysuria, -difficulty urinating,  -urinary frequency, -urgency, incontinence Neurology: -, -numbness, , -memory loss, -falls, -dizziness    PHYSICAL EXAM:   General Appearance: Alert, cooperative, no distress, appears stated age Head: Normocephalic, without obvious abnormality, atraumatic Eyes: PERRL, conjunctiva/corneas clear, EOM's intact, fundi benign Ears: Normal TM's and external ear canals Nose: Nares normal, mucosa normal, no drainage or sinus   tenderness Throat: Lips, mucosa, and tongue normal; teeth and gums normal Neck: Supple, no lymphadenopathy, thyroid:no enlargement/tenderness/nodules; no carotid bruit or JVD.  3 x 4 cm round smooth movable lesion is noted in the left submandibular area. Lungs: Clear to auscultation bilaterally without wheezes, rales or ronchi; respirations unlabored Heart: Regular rate and rhythm, S1 and S2 normal, no murmur, rub or gallop Abdomen: Soft, non-tender, nondistended, normoactive bowel sounds, no masses, no hepatosplenomegaly Extremities: No clubbing, cyanosis or edema 1-2+ pitting edema is noted. Pulses: 2+ and symmetric all extremities Skin: Skin color, texture, turgor normal, purpuric lesions noted on both forearms. Lymph nodes: Cervical, supraclavicular, and axillary nodes normal Neurologic: CNII-XII intact, normal strength, sensation and gait; reflexes 2+ and symmetric throughout   Psych: Normal mood, affect, hygiene and grooming Hemoglobin A1c is 6.8  ASSESSMENT/PLAN: Routine general medical examination at a health care facility - Plan: Comprehensive metabolic panel, CBC with Differential/Platelet, Lipid panel  Statin intolerance: No intervention needed.  Non-seasonal allergic rhinitis due to pollen:   Continue on OTC meds.  Obesity (BMI 30-39.9):   Continue to work on diet and exercise.  Hypertension associated with diabetes ([place]) - Plan: Comprehensive metabolic panel, CBC with Differential/Platelet  Hyperlipidemia  associated with type 2 diabetes mellitus ([place]): No further intervention anticipated  Psoriasis: Call if symptoms recur. Current occasional smoker: Encouraged him to quit smoking but at this time he is not ready Status post total replacement of left hip: No intervention.  Controlled type 2 diabetes mellitus without complication, without long-term current use of insulin ([place]) - Plan: Comprehensive metabolic panel, CBC with Differential/Platelet, Lipid panel, POCT glycosylated hemoglobin (Hb A1C), POCT UA - Microalbumin  Macular degeneration, unspecified laterality, unspecified type  Aortic atherosclerosis ([facility])  History of cataract extraction, unspecified laterality: Follow-up with ophthalmology  Onychomycosis: No present therapy.  Senile purpura [facility]): Monitor  Mass of left submandibular region: No intervention anticipated  Dependent edema: Discussed the diagnosis of dependent edema with him.  Recommend elevating his feet as much as possible as well as using support stockings.        Medicare Attestation I have personally reviewed: The patient's medical and social history Their use of alcohol, tobacco or illicit drugs Their current medications and supplements The patient's functional ability including ADLs,fall risks, home safety risks, cognitive, and hearing and visual impairment Diet and physical activities Evidence for depression or mood disorders  The patient's weight, height, and BMI have been recorded in the chart.  I have made referrals, counseling, and provided education to the patient based on review of the above and I have provided the patient with a written personalized care plan for preventive services.     Jill Alexanders, MD   01/29/2020

## 2020-01-30 LAB — CBC WITH DIFFERENTIAL/PLATELET
Basophils Absolute: 0 10*3/uL (ref 0.0–0.2)
Basos: 0 %
EOS (ABSOLUTE): 0.2 10*3/uL (ref 0.0–0.4)
Eos: 3 %
Hematocrit: 42.1 % (ref 37.5–51.0)
Hemoglobin: 13.8 g/dL (ref 13.0–17.7)
Immature Grans (Abs): 0 10*3/uL (ref 0.0–0.1)
Immature Granulocytes: 0 %
Lymphocytes Absolute: 2.2 10*3/uL (ref 0.7–3.1)
Lymphs: 26 %
MCH: 30.9 pg (ref 26.6–33.0)
MCHC: 32.8 g/dL (ref 31.5–35.7)
MCV: 94 fL (ref 79–97)
Monocytes Absolute: 0.9 10*3/uL (ref 0.1–0.9)
Monocytes: 10 %
Neutrophils Absolute: 5 10*3/uL (ref 1.4–7.0)
Neutrophils: 61 %
Platelets: 286 10*3/uL (ref 150–450)
RBC: 4.46 x10E6/uL (ref 4.14–5.80)
RDW: 12.1 % (ref 11.6–15.4)
WBC: 8.3 10*3/uL (ref 3.4–10.8)

## 2020-01-30 LAB — LIPID PANEL
Chol/HDL Ratio: 5.5 ratio — ABNORMAL HIGH (ref 0.0–5.0)
Cholesterol, Total: 187 mg/dL (ref 100–199)
HDL: 34 mg/dL — ABNORMAL LOW (ref >39)
LDL Chol Calc (NIH): 122 mg/dL — ABNORMAL HIGH (ref 0–99)
Triglycerides: 174 mg/dL — ABNORMAL HIGH (ref 0–149)
VLDL Cholesterol Cal: 31 mg/dL (ref 5–40)

## 2020-01-30 LAB — COMPREHENSIVE METABOLIC PANEL
ALT: 30 IU/L (ref 0–44)
AST: 32 IU/L (ref 0–40)
Albumin/Globulin Ratio: 1.8 (ref 1.2–2.2)
Albumin: 4.2 g/dL (ref 3.6–4.6)
Alkaline Phosphatase: 70 IU/L (ref 48–121)
BUN/Creatinine Ratio: 12 (ref 10–24)
BUN: 12 mg/dL (ref 8–27)
Bilirubin Total: 0.4 mg/dL (ref 0.0–1.2)
CO2: 23 mmol/L (ref 20–29)
Calcium: 9.1 mg/dL (ref 8.6–10.2)
Chloride: 103 mmol/L (ref 96–106)
Creatinine, Ser: 0.97 mg/dL (ref 0.76–1.27)
GFR calc Af Amer: 84 mL/min/{1.73_m2} (ref >59)
GFR calc non Af Amer: 72 mL/min/{1.73_m2} (ref >59)
Globulin, Total: 2.4 g/dL (ref 1.5–4.5)
Glucose: 90 mg/dL (ref 65–99)
Potassium: 5 mmol/L (ref 3.5–5.2)
Sodium: 141 mmol/L (ref 134–144)
Total Protein: 6.6 g/dL (ref 6.0–8.5)

## 2020-02-18 ENCOUNTER — Other Ambulatory Visit: Payer: Self-pay | Admitting: Family Medicine

## 2020-02-18 DIAGNOSIS — I152 Hypertension secondary to endocrine disorders: Secondary | ICD-10-CM

## 2020-02-18 DIAGNOSIS — I10 Essential (primary) hypertension: Secondary | ICD-10-CM

## 2020-04-09 ENCOUNTER — Other Ambulatory Visit: Payer: Self-pay

## 2020-04-09 ENCOUNTER — Other Ambulatory Visit (INDEPENDENT_AMBULATORY_CARE_PROVIDER_SITE_OTHER): Payer: Medicare Other

## 2020-04-09 DIAGNOSIS — Z23 Encounter for immunization: Secondary | ICD-10-CM | POA: Diagnosis not present

## 2020-05-08 ENCOUNTER — Other Ambulatory Visit: Payer: Self-pay | Admitting: Family Medicine

## 2020-05-08 DIAGNOSIS — I10 Essential (primary) hypertension: Secondary | ICD-10-CM

## 2020-05-08 DIAGNOSIS — E1159 Type 2 diabetes mellitus with other circulatory complications: Secondary | ICD-10-CM

## 2020-05-28 ENCOUNTER — Ambulatory Visit (INDEPENDENT_AMBULATORY_CARE_PROVIDER_SITE_OTHER): Payer: Medicare Other

## 2020-05-28 ENCOUNTER — Other Ambulatory Visit: Payer: Self-pay

## 2020-05-28 DIAGNOSIS — Z23 Encounter for immunization: Secondary | ICD-10-CM | POA: Diagnosis not present

## 2020-08-06 ENCOUNTER — Other Ambulatory Visit: Payer: Self-pay | Admitting: Family Medicine

## 2020-08-06 DIAGNOSIS — I10 Essential (primary) hypertension: Secondary | ICD-10-CM

## 2020-08-06 DIAGNOSIS — I152 Hypertension secondary to endocrine disorders: Secondary | ICD-10-CM

## 2020-08-06 DIAGNOSIS — E1159 Type 2 diabetes mellitus with other circulatory complications: Secondary | ICD-10-CM

## 2020-10-07 ENCOUNTER — Other Ambulatory Visit: Payer: Self-pay | Admitting: Family Medicine

## 2020-10-07 NOTE — Telephone Encounter (Signed)
[facility] is requesting to fill pt shampoo. Please advise  [facility]

## 2020-10-14 ENCOUNTER — Other Ambulatory Visit: Payer: Self-pay | Admitting: Family Medicine

## 2020-10-14 DIAGNOSIS — L409 Psoriasis, unspecified: Secondary | ICD-10-CM

## 2020-10-20 ENCOUNTER — Other Ambulatory Visit: Payer: Self-pay | Admitting: Family Medicine

## 2020-10-20 DIAGNOSIS — I10 Essential (primary) hypertension: Secondary | ICD-10-CM

## 2020-10-25 ENCOUNTER — Other Ambulatory Visit: Payer: Self-pay | Admitting: Family Medicine

## 2020-10-25 DIAGNOSIS — E1159 Type 2 diabetes mellitus with other circulatory complications: Secondary | ICD-10-CM

## 2020-11-17 ENCOUNTER — Inpatient Hospital Stay (HOSPITAL_COMMUNITY)
Admission: EM | Admit: 2020-11-17 | Discharge: 2020-11-22 | DRG: 871 | Disposition: A | Discharge status: Home Health | Payer: Medicare Other | Attending: Family Medicine | Admitting: Family Medicine

## 2020-11-17 ENCOUNTER — Encounter (HOSPITAL_COMMUNITY): Payer: Self-pay

## 2020-11-17 ENCOUNTER — Emergency Department (HOSPITAL_COMMUNITY): Payer: Medicare Other

## 2020-11-17 ENCOUNTER — Other Ambulatory Visit: Payer: Self-pay

## 2020-11-17 DIAGNOSIS — J9601 Acute respiratory failure with hypoxia: Secondary | ICD-10-CM | POA: Diagnosis present

## 2020-11-17 DIAGNOSIS — K111 Hypertrophy of salivary gland: Secondary | ICD-10-CM

## 2020-11-17 DIAGNOSIS — I10 Essential (primary) hypertension: Secondary | ICD-10-CM | POA: Diagnosis not present

## 2020-11-17 DIAGNOSIS — C3492 Malignant neoplasm of unspecified part of left bronchus or lung: Secondary | ICD-10-CM

## 2020-11-17 DIAGNOSIS — R042 Hemoptysis: Secondary | ICD-10-CM | POA: Diagnosis not present

## 2020-11-17 DIAGNOSIS — C3412 Malignant neoplasm of upper lobe, left bronchus or lung: Secondary | ICD-10-CM | POA: Diagnosis not present

## 2020-11-17 DIAGNOSIS — E669 Obesity, unspecified: Secondary | ICD-10-CM | POA: Diagnosis present

## 2020-11-17 DIAGNOSIS — R32 Unspecified urinary incontinence: Secondary | ICD-10-CM | POA: Diagnosis present

## 2020-11-17 DIAGNOSIS — R220 Localized swelling, mass and lump, head: Secondary | ICD-10-CM | POA: Diagnosis not present

## 2020-11-17 DIAGNOSIS — R0602 Shortness of breath: Secondary | ICD-10-CM

## 2020-11-17 DIAGNOSIS — Z20822 Contact with and (suspected) exposure to covid-19: Secondary | ICD-10-CM | POA: Diagnosis present

## 2020-11-17 DIAGNOSIS — I6523 Occlusion and stenosis of bilateral carotid arteries: Secondary | ICD-10-CM | POA: Diagnosis not present

## 2020-11-17 DIAGNOSIS — H109 Unspecified conjunctivitis: Secondary | ICD-10-CM

## 2020-11-17 DIAGNOSIS — B309 Viral conjunctivitis, unspecified: Secondary | ICD-10-CM | POA: Diagnosis not present

## 2020-11-17 DIAGNOSIS — J189 Pneumonia, unspecified organism: Secondary | ICD-10-CM | POA: Diagnosis present

## 2020-11-17 DIAGNOSIS — R0902 Hypoxemia: Secondary | ICD-10-CM

## 2020-11-17 DIAGNOSIS — E785 Hyperlipidemia, unspecified: Secondary | ICD-10-CM | POA: Diagnosis present

## 2020-11-17 DIAGNOSIS — K118 Other diseases of salivary glands: Secondary | ICD-10-CM | POA: Diagnosis not present

## 2020-11-17 DIAGNOSIS — Z91040 Latex allergy status: Secondary | ICD-10-CM

## 2020-11-17 DIAGNOSIS — Z8249 Family history of ischemic heart disease and other diseases of the circulatory system: Secondary | ICD-10-CM

## 2020-11-17 DIAGNOSIS — D3611 Benign neoplasm of peripheral nerves and autonomic nervous system of face, head, and neck: Secondary | ICD-10-CM | POA: Diagnosis not present

## 2020-11-17 DIAGNOSIS — Z881 Allergy status to other antibiotic agents status: Secondary | ICD-10-CM

## 2020-11-17 DIAGNOSIS — R059 Cough, unspecified: Secondary | ICD-10-CM | POA: Diagnosis not present

## 2020-11-17 DIAGNOSIS — F17210 Nicotine dependence, cigarettes, uncomplicated: Secondary | ICD-10-CM | POA: Diagnosis present

## 2020-11-17 DIAGNOSIS — R058 Other specified cough: Secondary | ICD-10-CM | POA: Diagnosis not present

## 2020-11-17 DIAGNOSIS — R221 Localized swelling, mass and lump, neck: Secondary | ICD-10-CM | POA: Diagnosis not present

## 2020-11-17 DIAGNOSIS — Z96642 Presence of left artificial hip joint: Secondary | ICD-10-CM | POA: Diagnosis not present

## 2020-11-17 DIAGNOSIS — R7989 Other specified abnormal findings of blood chemistry: Secondary | ICD-10-CM | POA: Diagnosis present

## 2020-11-17 DIAGNOSIS — I708 Atherosclerosis of other arteries: Secondary | ICD-10-CM | POA: Diagnosis not present

## 2020-11-17 DIAGNOSIS — I6521 Occlusion and stenosis of right carotid artery: Secondary | ICD-10-CM | POA: Diagnosis not present

## 2020-11-17 DIAGNOSIS — C3490 Malignant neoplasm of unspecified part of unspecified bronchus or lung: Secondary | ICD-10-CM | POA: Diagnosis not present

## 2020-11-17 DIAGNOSIS — I700 Atherosclerosis of aorta: Secondary | ICD-10-CM | POA: Diagnosis not present

## 2020-11-17 DIAGNOSIS — R6520 Severe sepsis without septic shock: Secondary | ICD-10-CM | POA: Diagnosis present

## 2020-11-17 DIAGNOSIS — R590 Localized enlarged lymph nodes: Secondary | ICD-10-CM | POA: Diagnosis not present

## 2020-11-17 DIAGNOSIS — R918 Other nonspecific abnormal finding of lung field: Secondary | ICD-10-CM | POA: Diagnosis not present

## 2020-11-17 DIAGNOSIS — E1159 Type 2 diabetes mellitus with other circulatory complications: Secondary | ICD-10-CM | POA: Diagnosis not present

## 2020-11-17 DIAGNOSIS — Z79899 Other long term (current) drug therapy: Secondary | ICD-10-CM | POA: Diagnosis not present

## 2020-11-17 DIAGNOSIS — J449 Chronic obstructive pulmonary disease, unspecified: Secondary | ICD-10-CM | POA: Diagnosis not present

## 2020-11-17 DIAGNOSIS — J929 Pleural plaque without asbestos: Secondary | ICD-10-CM | POA: Diagnosis not present

## 2020-11-17 DIAGNOSIS — D117 Benign neoplasm of other major salivary glands: Secondary | ICD-10-CM | POA: Diagnosis present

## 2020-11-17 DIAGNOSIS — J984 Other disorders of lung: Secondary | ICD-10-CM | POA: Diagnosis not present

## 2020-11-17 DIAGNOSIS — Z85828 Personal history of other malignant neoplasm of skin: Secondary | ICD-10-CM | POA: Diagnosis not present

## 2020-11-17 DIAGNOSIS — I2510 Atherosclerotic heart disease of native coronary artery without angina pectoris: Secondary | ICD-10-CM | POA: Diagnosis not present

## 2020-11-17 DIAGNOSIS — N179 Acute kidney failure, unspecified: Secondary | ICD-10-CM | POA: Diagnosis not present

## 2020-11-17 DIAGNOSIS — A419 Sepsis, unspecified organism: Secondary | ICD-10-CM | POA: Diagnosis not present

## 2020-11-17 DIAGNOSIS — E119 Type 2 diabetes mellitus without complications: Secondary | ICD-10-CM | POA: Diagnosis not present

## 2020-11-17 DIAGNOSIS — J069 Acute upper respiratory infection, unspecified: Secondary | ICD-10-CM | POA: Diagnosis present

## 2020-11-17 DIAGNOSIS — E875 Hyperkalemia: Secondary | ICD-10-CM | POA: Diagnosis not present

## 2020-11-17 DIAGNOSIS — D119 Benign neoplasm of major salivary gland, unspecified: Secondary | ICD-10-CM

## 2020-11-17 DIAGNOSIS — Z6833 Body mass index (BMI) 33.0-33.9, adult: Secondary | ICD-10-CM

## 2020-11-17 DIAGNOSIS — J90 Pleural effusion, not elsewhere classified: Secondary | ICD-10-CM | POA: Diagnosis not present

## 2020-11-17 DIAGNOSIS — I152 Hypertension secondary to endocrine disorders: Secondary | ICD-10-CM

## 2020-11-17 HISTORY — DX: Type 2 diabetes mellitus without complications: E11.9

## 2020-11-17 LAB — URINALYSIS, ROUTINE W REFLEX MICROSCOPIC
Bilirubin Urine: NEGATIVE
Glucose, UA: NEGATIVE mg/dL
Hgb urine dipstick: NEGATIVE
Ketones, ur: 5 mg/dL — AB
Leukocytes,Ua: NEGATIVE
Nitrite: NEGATIVE
Protein, ur: 100 mg/dL — AB
Specific Gravity, Urine: 1.025 (ref 1.005–1.030)
pH: 5 (ref 5.0–8.0)

## 2020-11-17 LAB — CBC WITH DIFFERENTIAL/PLATELET
Abs Immature Granulocytes: 0.14 10*3/uL — ABNORMAL HIGH (ref 0.00–0.07)
Basophils Absolute: 0 10*3/uL (ref 0.0–0.1)
Basophils Relative: 0 %
Eosinophils Absolute: 0 10*3/uL (ref 0.0–0.5)
Eosinophils Relative: 0 %
HCT: 40.3 % (ref 39.0–52.0)
Hemoglobin: 12.8 g/dL — ABNORMAL LOW (ref 13.0–17.0)
Immature Granulocytes: 1 %
Lymphocytes Relative: 4 %
Lymphs Abs: 0.8 10*3/uL (ref 0.7–4.0)
MCH: 30.3 pg (ref 26.0–34.0)
MCHC: 31.8 g/dL (ref 30.0–36.0)
MCV: 95.3 fL (ref 80.0–100.0)
Monocytes Absolute: 1.7 10*3/uL — ABNORMAL HIGH (ref 0.1–1.0)
Monocytes Relative: 9 %
Neutro Abs: 16.1 10*3/uL — ABNORMAL HIGH (ref 1.7–7.7)
Neutrophils Relative %: 86 %
Platelets: 352 10*3/uL (ref 150–400)
RBC: 4.23 MIL/uL (ref 4.22–5.81)
RDW: 13 % (ref 11.5–15.5)
WBC: 18.7 10*3/uL — ABNORMAL HIGH (ref 4.0–10.5)
nRBC: 0 % (ref 0.0–0.2)

## 2020-11-17 LAB — RESP PANEL BY RT-PCR (FLU A&B, COVID) ARPGX2
Influenza A by PCR: NEGATIVE
Influenza B by PCR: NEGATIVE
SARS Coronavirus 2 by RT PCR: NEGATIVE

## 2020-11-17 LAB — COMPREHENSIVE METABOLIC PANEL
ALT: 18 U/L (ref 0–44)
AST: 25 U/L (ref 15–41)
Albumin: 3.2 g/dL — ABNORMAL LOW (ref 3.5–5.0)
Alkaline Phosphatase: 70 U/L (ref 38–126)
Anion gap: 7 (ref 5–15)
BUN: 24 mg/dL — ABNORMAL HIGH (ref 8–23)
CO2: 26 mmol/L (ref 22–32)
Calcium: 8.3 mg/dL — ABNORMAL LOW (ref 8.9–10.3)
Chloride: 103 mmol/L (ref 98–111)
Creatinine, Ser: 1.34 mg/dL — ABNORMAL HIGH (ref 0.61–1.24)
GFR, Estimated: 53 mL/min — ABNORMAL LOW (ref >60)
Glucose, Bld: 221 mg/dL — ABNORMAL HIGH (ref 70–99)
Potassium: 4.5 mmol/L (ref 3.5–5.1)
Sodium: 136 mmol/L (ref 135–145)
Total Bilirubin: 0.5 mg/dL (ref 0.3–1.2)
Total Protein: 7.3 g/dL (ref 6.5–8.1)

## 2020-11-17 LAB — APTT: aPTT: 35 seconds (ref 24–36)

## 2020-11-17 LAB — MRSA PCR SCREENING: MRSA by PCR: NEGATIVE

## 2020-11-17 LAB — PROTIME-INR
INR: 1.1 (ref 0.8–1.2)
Prothrombin Time: 14 seconds (ref 11.4–15.2)

## 2020-11-17 LAB — LACTIC ACID, PLASMA
Lactic Acid, Venous: 2.6 mmol/L (ref 0.5–1.9)
Lactic Acid, Venous: 2 mmol/L (ref 0.5–1.9)

## 2020-11-17 LAB — GLUCOSE, CAPILLARY: Glucose-Capillary: 175 mg/dL — ABNORMAL HIGH (ref 70–99)

## 2020-11-17 MED ORDER — SODIUM CHLORIDE 0.9 % IV SOLN
500.0000 mg | INTRAVENOUS | Status: DC
  Administered 2020-11-17 – 2020-11-20 (×4): 500 mg via INTRAVENOUS
  Filled 2020-11-17 (×5): qty 500

## 2020-11-17 MED ORDER — LACTATED RINGERS IV BOLUS (SEPSIS)
1000.0000 mL | Freq: Once | INTRAVENOUS | Status: AC
Start: 2020-11-17 — End: 2020-11-17
  Administered 2020-11-17: 1000 mL via INTRAVENOUS

## 2020-11-17 MED ORDER — ACETAMINOPHEN 650 MG RE SUPP: 650.0000 mg | Freq: Four times a day (QID) | RECTAL | Status: DC | PRN

## 2020-11-17 MED ORDER — INSULIN ASPART 100 UNIT/ML IJ SOLN
0.0000 [IU] | Freq: Three times a day (TID) | INTRAMUSCULAR | Status: DC
  Administered 2020-11-18: 1 [IU] via SUBCUTANEOUS
  Administered 2020-11-19: 2 [IU] via SUBCUTANEOUS
  Administered 2020-11-19 (×2): 1 [IU] via SUBCUTANEOUS
  Administered 2020-11-20: 2 [IU] via SUBCUTANEOUS
  Filled 2020-11-17: qty 0.09

## 2020-11-17 MED ORDER — ALBUTEROL SULFATE (2.5 MG/3ML) 0.083% IN NEBU: 2.5000 mg | INHALATION_SOLUTION | RESPIRATORY_TRACT | Status: DC | PRN

## 2020-11-17 MED ORDER — CIPROFLOXACIN HCL 0.3 % OP SOLN
1.0000 [drp] | Freq: Once | OPHTHALMIC | Status: DC
  Filled 2020-11-17: qty 2.5

## 2020-11-17 MED ORDER — KETOCONAZOLE 2 % EX CREA: TOPICAL_CREAM | Freq: Every day | CUTANEOUS | Status: DC | PRN

## 2020-11-17 MED ORDER — SODIUM CHLORIDE 0.9% FLUSH
3.0000 mL | Freq: Two times a day (BID) | INTRAVENOUS | Status: DC
  Administered 2020-11-17 – 2020-11-21 (×7): 3 mL via INTRAVENOUS

## 2020-11-17 MED ORDER — SODIUM CHLORIDE 0.9 % IV SOLN
2.0000 g | INTRAVENOUS | Status: AC
  Administered 2020-11-17 – 2020-11-21 (×5): 2 g via INTRAVENOUS
  Filled 2020-11-17: qty 2
  Filled 2020-11-17: qty 20
  Filled 2020-11-17: qty 2
  Filled 2020-11-17: qty 20
  Filled 2020-11-17: qty 2

## 2020-11-17 MED ORDER — LACTATED RINGERS IV BOLUS (SEPSIS)
1000.0000 mL | Freq: Once | INTRAVENOUS | Status: AC
  Administered 2020-11-17: 1000 mL via INTRAVENOUS

## 2020-11-17 MED ORDER — LACTATED RINGERS IV SOLN: INTRAVENOUS | Status: DC

## 2020-11-17 MED ORDER — POLYVINYL ALCOHOL 1.4 % OP SOLN
1.0000 [drp] | Freq: Three times a day (TID) | OPHTHALMIC | Status: DC
  Administered 2020-11-17 – 2020-11-22 (×13): 1 [drp] via OPHTHALMIC
  Filled 2020-11-17: qty 15

## 2020-11-17 MED ORDER — ACETAMINOPHEN 325 MG PO TABS
650.0000 mg | ORAL_TABLET | Freq: Four times a day (QID) | ORAL | Status: DC | PRN
  Administered 2020-11-18: 650 mg via ORAL
  Filled 2020-11-17: qty 2

## 2020-11-17 MED ORDER — METOPROLOL TARTRATE 50 MG PO TABS
50.0000 mg | ORAL_TABLET | Freq: Two times a day (BID) | ORAL | Status: DC
  Administered 2020-11-17 – 2020-11-22 (×10): 50 mg via ORAL
  Filled 2020-11-17 (×7): qty 1
  Filled 2020-11-17: qty 2
  Filled 2020-11-17 (×3): qty 1

## 2020-11-17 MED ORDER — HYPROMELLOSE (GONIOSCOPIC) 2.5 % OP SOLN: 1.0000 [drp] | Freq: Three times a day (TID) | OPHTHALMIC | Status: DC

## 2020-11-17 MED ORDER — LACTATED RINGERS IV BOLUS (SEPSIS)
500.0000 mL | Freq: Once | INTRAVENOUS | Status: AC
  Administered 2020-11-17: 500 mL via INTRAVENOUS

## 2020-11-17 NOTE — ED Notes (Signed)
Pt sitting on side of bed using urinal

## 2020-11-17 NOTE — ED Notes (Signed)
Patient placed on 2lpm [place]

## 2020-11-17 NOTE — ED Notes (Signed)
ED TO INPATIENT HANDOFF REPORT  Name/Age/Gender Alex Edwards 85 y.o. male  Code Status    Code Status Orders  (From admission, onward)         Start     Ordered   11/17/20 1632  Full code  Continuous        11/17/20 1631        Code Status History    Date Active Date Inactive Code Status Order ID Comments User Context   11/17/2020 1453 11/17/2020 1631 DNR [id number]  Harold Hedge, MD ED   02/14/2014 1020 02/15/2014 1850 Full Code [id number]  Mcarthur Rossetti, MD Inpatient   01/04/2014 1629 01/06/2014 1401 Full Code [id number]  Mcarthur Rossetti, MD Inpatient   Advance Care Planning Activity      Home/SNF/Other Home  Chief Complaint Acute respiratory failure with hypoxia ([place]) [J96.01]  Level of Care/Admitting Diagnosis ED Disposition    ED Disposition Condition [place]: [place] [[id number]]  Level of Care: Progressive [102]  Admit to Progressive based on following criteria: RESPIRATORY PROBLEMS hypoxemic/hypercapnic respiratory failure that is responsive to NIPPV (BiPAP) or High Flow Nasal Cannula (6-80 lpm). Frequent assessment/intervention, no > Q2 hrs < Q4 hrs, to maintain oxygenation and pulmonary hygiene.  May admit patient to [facility] or [facility] if equivalent level of care is available:: Yes  Covid Evaluation: Confirmed COVID Negative  Diagnosis: Acute respiratory failure with hypoxia [facility]) [[id number]]  Admitting Physician: Harold Hedge [[id number]]  Attending Physician: Harold Hedge [[id number]]  Estimated length of stay: past midnight tomorrow  Certification:: I certify this patient will need inpatient services for at least 2 midnights       Medical History Past Medical History:  Diagnosis Date  . BCE (basal cell epithelioma)   . Bruises easily    on hands  . Cough    smokers cough or perfurmes  . Degeneration macular    right eye  . Dyslipidemia   . ED (erectile dysfunction)   . Hx of  adenomatous colonic polyps   . Hyperlipidemia   . Hypertension    borderline  . Obesity   . Psoriasis   . Seasonal allergies   . Smoker    former    Allergies Allergies  Allergen Reactions  . Vancomycin     Red Man Syndrome   . Latex Other (See Comments)    "possibly allergic- dry, itching, rash"    IV Location/Drains/Wounds Patient Lines/Drains/Airways Status    Active Line/Drains/Airways    Name Placement date Placement time Site Days   Peripheral IV 11/17/20 Left Antecubital 11/17/20  1148  Antecubital  less than 1   Airway 02/14/14  0723  -- 2468   Incision (Closed) 01/04/14 Hip Left 01/04/14  1449  -- 2509   Incision (Closed) 02/14/14 Hip Left 02/14/14  0720  -- 2468          Labs/Imaging Results for orders placed or performed during the hospital encounter of 11/17/20 (from the past 48 hour(s))  Resp Panel by RT-PCR (Flu A&B, Covid) Nasopharyngeal Swab     Status: None   Collection Time: 11/17/20 11:16 AM   Specimen: Nasopharyngeal Swab; Nasopharyngeal(NP) swabs in vial transport medium  Result Value Ref Range   SARS Coronavirus 2 by RT PCR NEGATIVE NEGATIVE    Comment: (NOTE) SARS-CoV-2 target nucleic acids are NOT DETECTED.  The SARS-CoV-2 RNA is generally detectable in upper respiratory specimens during the acute  phase of infection. The lowest concentration of SARS-CoV-2 viral copies this assay can detect is 138 copies/mL. A negative result does not preclude SARS-Cov-2 infection and should not be used as the sole basis for treatment or other patient management decisions. A negative result may occur with  improper specimen collection/handling, submission of specimen other than nasopharyngeal swab, presence of viral mutation(s) within the areas targeted by this assay, and inadequate number of viral copies(<138 copies/mL). A negative result must be combined with clinical observations, patient history, and epidemiological information. The expected result is  Negative.  Fact Sheet for Patients:  EntrepreneurPulse.com.au  Fact Sheet for Healthcare Providers:  IncredibleEmployment.be  This test is no t yet approved or cleared by the [place] FDA and  has been authorized for detection and/or diagnosis of SARS-CoV-2 by FDA under an Emergency Use Authorization (EUA). This EUA will remain  in effect (meaning this test can be used) for the duration of the COVID-19 declaration under Section 564(b)(1) of the Act, 21 U.S.C.section 360bbb-3(b)(1), unless the authorization is terminated  or revoked sooner.       Influenza A by PCR NEGATIVE NEGATIVE   Influenza B by PCR NEGATIVE NEGATIVE    Comment: (NOTE) The Xpert Xpress SARS-CoV-2/FLU/RSV plus assay is intended as an aid in the diagnosis of influenza from Nasopharyngeal swab specimens and should not be used as a sole basis for treatment. Nasal washings and aspirates are unacceptable for Xpert Xpress SARS-CoV-2/FLU/RSV testing.  Fact Sheet for Patients: EntrepreneurPulse.com.au  Fact Sheet for Healthcare Providers: IncredibleEmployment.be  This test is not yet approved or cleared by the [place] FDA and has been authorized for detection and/or diagnosis of SARS-CoV-2 by FDA under an Emergency Use Authorization (EUA). This EUA will remain in effect (meaning this test can be used) for the duration of the COVID-19 declaration under Section 564(b)(1) of the Act, 21 U.S.C. section 360bbb-3(b)(1), unless the authorization is terminated or revoked.  Performed at [facility], [place] [address], [place], [place] [address]   Lactic acid, plasma     Status: Abnormal   Collection Time: 11/17/20 11:44 AM  Result Value Ref Range   Lactic Acid, Venous 2.6 (HH) 0.5 - 1.9 mmol/L    Comment: CRITICAL RESULT CALLED TO, READ BACK BY AND VERIFIED WITH: A.SMITH RN AT 7371 ON 11/17/20 BY S.VANHOORNE Performed  at [place] [address], [place], [place] [address]   Comprehensive metabolic panel     Status: Abnormal   Collection Time: 11/17/20 11:44 AM  Result Value Ref Range   Sodium 136 135 - 145 mmol/L   Potassium 4.5 3.5 - 5.1 mmol/L   Chloride 103 98 - 111 mmol/L   CO2 26 22 - 32 mmol/L   Glucose, Bld 221 (H) 70 - 99 mg/dL    Comment: Glucose reference range applies only to samples taken after fasting for at least 8 hours.   BUN 24 (H) 8 - 23 mg/dL   Creatinine, Ser 1.34 (H) 0.61 - 1.24 mg/dL   Calcium 8.3 (L) 8.9 - 10.3 mg/dL   Total Protein 7.3 6.5 - 8.1 g/dL   Albumin 3.2 (L) 3.5 - 5.0 g/dL   AST 25 15 - 41 U/L   ALT 18 0 - 44 U/L   Alkaline Phosphatase 70 38 - 126 U/L   Total Bilirubin 0.5 0.3 - 1.2 mg/dL   GFR, Estimated 53 (L) >60 mL/min    Comment: (NOTE) Calculated using the CKD-EPI Creatinine Equation (2021)  Anion gap 7 5 - 15    Comment: Performed at [facility], [place] [address] [place], [place] [address]  CBC WITH DIFFERENTIAL     Status: Abnormal   Collection Time: 11/17/20 11:44 AM  Result Value Ref Range   WBC 18.7 (H) 4.0 - 10.5 K/uL   RBC 4.23 4.22 - 5.81 MIL/uL   Hemoglobin 12.8 (L) 13.0 - 17.0 g/dL   HCT 40.3 39.0 - 52.0 %   MCV 95.3 80.0 - 100.0 fL   MCH 30.3 26.0 - 34.0 pg   MCHC 31.8 30.0 - 36.0 g/dL   RDW 13.0 11.5 - 15.5 %   Platelets 352 150 - 400 K/uL   nRBC 0.0 0.0 - 0.2 %   Neutrophils Relative % 86 %   Neutro Abs 16.1 (H) 1.7 - 7.7 K/uL   Lymphocytes Relative 4 %   Lymphs Abs 0.8 0.7 - 4.0 K/uL   Monocytes Relative 9 %   Monocytes Absolute 1.7 (H) 0.1 - 1.0 K/uL   Eosinophils Relative 0 %   Eosinophils Absolute 0.0 0.0 - 0.5 K/uL   Basophils Relative 0 %   Basophils Absolute 0.0 0.0 - 0.1 K/uL   Immature Granulocytes 1 %   Abs Immature Granulocytes 0.14 (H) 0.00 - 0.07 K/uL    Comment: Performed at [facility], [place] [address], [place], [place] [address]  Protime-INR      Status: None   Collection Time: 11/17/20 11:44 AM  Result Value Ref Range   Prothrombin Time 14.0 11.4 - 15.2 seconds   INR 1.1 0.8 - 1.2    Comment: (NOTE) INR goal varies based on device and disease states. Performed at [facility], [place] [address], [place], [place] [address]   APTT     Status: None   Collection Time: 11/17/20 11:44 AM  Result Value Ref Range   aPTT 35 24 - 36 seconds    Comment: Performed at [facility], [place] [address], [place], [place] [address]  Lactic acid, plasma     Status: Abnormal   Collection Time: 11/17/20  1:30 PM  Result Value Ref Range   Lactic Acid, Venous 2.0 (HH) 0.5 - 1.9 mmol/L    Comment: CRITICAL VALUE NOTED.  VALUE IS CONSISTENT WITH PREVIOUSLY REPORTED AND CALLED VALUE. Performed at [facility], [place] [address] [place], [place] [address]   Urinalysis, Routine w reflex microscopic Urine, Clean Catch     Status: Abnormal   Collection Time: 11/17/20  4:45 PM  Result Value Ref Range   Color, Urine YELLOW YELLOW   APPearance HAZY (A) CLEAR   Specific Gravity, Urine 1.025 1.005 - 1.030   pH 5.0 5.0 - 8.0   Glucose, UA NEGATIVE NEGATIVE mg/dL   Hgb urine dipstick NEGATIVE NEGATIVE   Bilirubin Urine NEGATIVE NEGATIVE   Ketones, ur 5 (A) NEGATIVE mg/dL   Protein, ur 100 (A) NEGATIVE mg/dL   Nitrite NEGATIVE NEGATIVE   Leukocytes,Ua NEGATIVE NEGATIVE   RBC / HPF 6-10 0 - 5 RBC/hpf   WBC, UA 0-5 0 - 5 WBC/hpf   Bacteria, UA RARE (A) NONE SEEN   Squamous Epithelial / LPF 0-5 0 - 5   Mucus PRESENT    Hyaline Casts, UA PRESENT    Granular Casts, UA PRESENT     Comment: Performed at [facility], [place] [address] [place], [place] [address]   CT Chest Wo Contrast  Result Date: 11/17/2020 CLINICAL  DATA:  Short of breath. EXAM: CT CHEST WITHOUT CONTRAST TECHNIQUE: Multidetector CT imaging of the chest was performed following the standard protocol without IV contrast.  COMPARISON:  01/03/2014 FINDINGS: Cardiovascular: Heart size is normal. Aortic atherosclerosis. Coronary artery calcifications. No pericardial effusion. Mediastinum/Nodes: Normal appearance of the thyroid gland. The trachea appears patent and is midline. Normal appearance of the esophagus. No axillary or supraclavicular adenopathy. Left paratracheal lymph node measures 1.4 cm, image 59/2. Subcarinal node measures 1.3 cm, image 73/2. The hilar lymph nodes are suboptimally evaluated due to lack of IV contrast material. Lungs/Pleura: Within the left upper lobe there is a large mass measuring 7.3 cm in maximum dimension, image 65/5 and image 90/6. Internal areas of gas within this mass are noted concerning for central necrosis. Surrounding interlobular septal thickening is noted within the left upper lobe concerning for lymphangitic spread of tumor. Scattered areas of tree-in-bud nodularity are identified throughout both lungs most notable within the lower lung zones. Peripheral areas of subpleural consolidation noted within the posterior and lateral right lower lobe. Upper Abdomen: No acute abnormality within the imaged portions of the upper abdomen. Nodule in the left adrenal gland measures 1.9 cm, image 157/2. Unchanged from 2015 compatible with a benign abnormality. Musculoskeletal: No chest wall mass or suspicious bone lesions identified. IMPRESSION: 1. Large left upper lobe lung mass is identified with internal areas of gas concerning for central necrosis. Findings are worrisome for primary bronchogenic carcinoma. Surrounding interlobular septal thickening is concerning for lymphangitic spread of tumor. Further evaluation with PET-CT and tissue sampling is advised. 2. Enlarged left paratracheal and subcarinal lymph nodes are identified. Cannot rule out metastatic adenopathy. 3. Scattered areas of tree-in-bud nodularity are identified throughout both lungs consistent with inflammatory or infectious bronchiolitis.  4. Peripheral areas of subpleural consolidation within the posterior and lateral right lower lobe are noted which may represent areas of atelectasis or pneumonia. 5. Aortic atherosclerosis. Coronary artery calcifications. Aortic Atherosclerosis (ICD10-I70.0). Electronically Signed   By: Kerby Moors M.D.   On: 11/17/2020 13:52   DG Chest Portable 1 View  Result Date: 11/17/2020 CLINICAL DATA:  Worsening cough, congestion, shortness of breath. Bloods tinged sputum. EXAM: PORTABLE CHEST 1 VIEW COMPARISON:  February 14, 2018. FINDINGS: Rounded/masslike 8 cm opacity in the lateral left midlung. Mild diffuse interstitial prominence. No visible pleural effusions or pneumothorax. Similar cardiomediastinal silhouette. IMPRESSION: 1. Rounded/masslike 8 cm opacity in the lateral left midlung. Recommend chest CT to evaluate for possible mass. 2. Diffuse interstitial prominence, possibly chronic bronchitic related change given similar findings on the prior. The recommend chest CT can also better characterize this finding and evaluate for superimposed interstitial edema or atypical infection. Electronically Signed   By: Margaretha Sheffield MD   On: 11/17/2020 12:05    Pending Labs Unresulted Labs (From admission, onward)          Start     Ordered   11/18/20 4920  Basic metabolic panel  Daily,   R      11/17/20 1453   11/18/20 0500  CBC  Daily,   R      11/17/20 1453   11/17/20 1737  Hemoglobin A1c  Add-on,   AD       Comments: To assess prior glycemic control    11/17/20 1737   11/17/20 1735  MRSA PCR Screening  Once,   STAT        11/17/20 1734   11/17/20 1130  Blood Culture (routine x 2)  (Septic presentation on  arrival (screening labs, nursing and treatment orders for obvious sepsis))  BLOOD CULTURE X 2,   STAT      11/17/20 1130   11/17/20 1130  Urine culture  (Septic presentation on arrival (screening labs, nursing and treatment orders for obvious sepsis))  ONCE - STAT,   STAT        11/17/20 1130           Vitals/Pain Today's Vitals   11/17/20 1730 11/17/20 1745 11/17/20 1815 11/17/20 1845  BP: (!) 163/69 (!) 148/63 (!) 165/66 (!) 164/66  Pulse: (!) 104 (!) 109 (!) 108 (!) 105  Resp: (!) 29 (!) 25 (!) 26 (!) 22  Temp:      TempSrc:      SpO2: 94% 92% 93% 92%  Weight:      Height:      PainSc:        Isolation Precautions Airborne and Contact precautions  Medications Medications  cefTRIAXone (ROCEPHIN) 2 g in sodium chloride 0.9 % 100 mL IVPB (0 g Intravenous Stopped 11/17/20 1214)  azithromycin (ZITHROMAX) 500 mg in sodium chloride 0.9 % 250 mL IVPB (0 mg Intravenous Stopped 11/17/20 1336)  metoprolol tartrate (LOPRESSOR) tablet 50 mg (50 mg Oral Not Given 11/17/20 1546)  ketoconazole (NIZORAL) 2 % cream (has no administration in time range)  sodium chloride flush (NS) 0.9 % injection 3 mL (3 mLs Intravenous Not Given 11/17/20 1536)  lactated ringers bolus 1,000 mL (1,000 mLs Intravenous New Bag/Given 11/17/20 1536)    And  lactated ringers bolus 1,000 mL (1,000 mLs Intravenous New Bag/Given 11/17/20 1721)    And  lactated ringers bolus 500 mL (has no administration in time range)  acetaminophen (TYLENOL) tablet 650 mg (has no administration in time range)    Or  acetaminophen (TYLENOL) suppository 650 mg (has no administration in time range)  albuterol (PROVENTIL) (2.5 MG/3ML) 0.083% nebulizer solution 2.5 mg (has no administration in time range)  hydroxypropyl methylcellulose / hypromellose (ISOPTO TEARS / GONIOVISC) 2.5 % ophthalmic solution 1 drop (has no administration in time range)  insulin aspart (novoLOG) injection 0-9 Units (has no administration in time range)  lactated ringers bolus 1,000 mL (0 mLs Intravenous Stopped 11/17/20 1337)    Mobility walks with person assist

## 2020-11-17 NOTE — Consult Note (Addendum)
NAME:  Alex Edwards, MRN:  [id number], DOB:  Feb 05, 1936, LOS: 0 ADMISSION DATE:  11/17/2020, CONSULTATION DATE:  11/17/2020 REFERRING MD:  Harold Hedge, MD, CHIEF COMPLAINT:  Lung mass  History of Present Illness:  The patient is an 85 year old gentleman with a history of tobacco use disorder who presents from home with 4 days of worsening shortness of breath, sputum production, and hemoptysis.  He notes that for the past 4 days he has had decreased appetite.  He denies any fevers chills night sweats or weight loss.  He has smoked no more than a pack a day for the last 60 years on and off.  He quit last week when his breathing and coughing got worse.  He does have a daily cough with mild sputum production but has been worse in the last few days.  He does not have a formal diagnosis of COPD.  He came to the ED with the symptoms and had a CT chest which demonstrated left upper lobe mass concerning for primary lung malignancy.  Pulmonary is being consulted to assist with potential bronchoscopy with biopsy of this mass.  Pertinent  Medical History  Hypertension Hyperlipidemia  Significant Hospital Events: Including procedures, antibiotic start and stop dates in addition to other pertinent events   . 5/23 presented to ED admitted to hospital medicine  Interim History / Subjective:    Objective   Blood pressure (!) 150/73, pulse 98, temperature 98.7 F (37.1 C), temperature source Oral, resp. rate 17, height _0  (1.854 m), weight 114 kg, SpO2 94 %.        Intake/Output Summary (Last 24 hours) at 11/17/2020 1632 Last data filed at 11/17/2020 1458 Gross per 24 hour  Intake 1594.17 ml  Output --  Net 1594.17 ml   Filed Weights   11/17/20 1058  Weight: 114 kg    Examination: General: Elderly, no respiratory distress, frequent coughing HENT: Mucous membranes moist, no blood in the oropharynx, he has tissues which show some blood mixed with sputum which is bright red Lungs: Diminished  bilaterally, no wheezes or crackles Cardiovascular: Regular rate and rhythm, no murmurs rubs or gallops Abdomen: Obese, soft, non tender, normal bowel sounds Extremities: No edema Neuro: Normal speech, no focal asymmetry MSK: No rashes  Labs/imaging that I havepersonally reviewed  (right click and "Reselect all SmartList Selections" daily)  Reviewed the CT scan of the chest obtained 5/23.  There is a left upper lobe mass with interlobular septal thickening concerning for lymphangitic spread this is concerning for primary lung malignancy.  There is some central cavitation.  There are some mildly enlarged lymph nodes in the left hilum as well as subcarinal area.  Lab Results  Component Value Date   INR 1.1 11/17/2020   INR 1.02 02/12/2014   INR 0.92 12/25/2013   Lab Results  Component Value Date   WBC 18.7 (H) 11/17/2020   HGB 12.8 (L) 11/17/2020   HCT 40.3 11/17/2020   MCV 95.3 11/17/2020   PLT 352 11/17/2020    EKG shows sinus tachycardia from today   Assessment & Plan:   Left upper Lobe Lung mass with lymphadenopathy Hemoptysis Tobacco use disorder  I explained to the patient that this finding is highly concerning for primary lung malignancy.  When asked if he would want to proceed with any kind of treatment for his cancer, he said that he would want to know what the options are.  After discussion of the risks and benefits of  procedure, we will proceed with bronchoscopy with endobronchial ultrasound with TBNA, bronchoalveolar lavage, brushings, and possible endobronchial biopsy.  NPO at midnight. He expressed his wishes to me that he would want chest compressions and a trial of life support but would not want to be kept alive indefinitely on the ventilator for a prolonged period of time without the likelihood for meaningful recovery. I have changed his status to full code.   Deontra Pereyra, MD Pulmonary and Critical Care Medicine [facility]   Labs   CBC: Recent  Labs  Lab 11/17/20 1144  WBC 18.7*  NEUTROABS 16.1*  HGB 12.8*  HCT 40.3  MCV 95.3  PLT 352    Basic Metabolic Panel: Recent Labs  Lab 11/17/20 1144  NA 136  K 4.5  CL 103  CO2 26  GLUCOSE 221*  BUN 24*  CREATININE 1.34*  CALCIUM 8.3*   GFR: Estimated Creatinine Clearance: 55.2 mL/min (A) (by C-G formula based on SCr of 1.34 mg/dL (H)). Recent Labs  Lab 11/17/20 1144 11/17/20 1330  WBC 18.7*  --   LATICACIDVEN 2.6* 2.0*    Liver Function Tests: Recent Labs  Lab 11/17/20 1144  AST 25  ALT 18  ALKPHOS 70  BILITOT 0.5  PROT 7.3  ALBUMIN 3.2*   No results for input(s): LIPASE, AMYLASE in the last 168 hours. No results for input(s): AMMONIA in the last 168 hours.  ABG No results found for: PHART, PCO2ART, PO2ART, HCO3, TCO2, ACIDBASEDEF, O2SAT   Coagulation Profile: Recent Labs  Lab 11/17/20 1144  INR 1.1    Cardiac Enzymes: No results for input(s): CKTOTAL, CKMB, CKMBINDEX, TROPONINI in the last 168 hours.  HbA1C: Hemoglobin A1C  Date/Time Value Ref Range Status  01/29/2020 11:51 AM 6.8 (A) 4.0 - 5.6 % Final  01/24/2019 12:15 PM 6.6 (A) 4.0 - 5.6 % Final   Hgb A1c MFr Bld  Date/Time Value Ref Range Status  07/26/2016 09:21 AM 6.7 (H) <5.7 % Final    Comment:      For someone without known diabetes, a hemoglobin A1c value of 6.5% or greater indicates that they may have diabetes and this should be confirmed with a follow-up test.   For someone with known diabetes, a value <7% indicates that their diabetes is well controlled and a value greater than or equal to 7% indicates suboptimal control. A1c targets should be individualized based on duration of diabetes, age, comorbid conditions, and other considerations.   Currently, no consensus exists for use of hemoglobin A1c for diagnosis of diabetes for children.       CBG: No results for input(s): GLUCAP in the last 168 hours.  Review of Systems:   Positive for shortness of breath,  cough, hemoptysis Negative for fevers chills night sweats or weight loss Negative for chest pain Otherwise 10 point review of systems reviewed and negative  Past Medical History:  He,  has a past medical history of BCE (basal cell epithelioma), Bruises easily, Cough, Degeneration macular, Dyslipidemia, ED (erectile dysfunction), adenomatous colonic polyps, Hyperlipidemia, Hypertension, Obesity, Psoriasis, Seasonal allergies, and Smoker.   Surgical History:   Past Surgical History:  Procedure Laterality Date  . CATARACT EXTRACTION, BILATERAL Bilateral 2013  . COLONOSCOPY  2005   [facility]  . POLYPECTOMY    . SKIN CANCER EXCISION Right 1993   under eye-MOHS, freeze multiple places freq  . TONSILLECTOMY  as child  . TOTAL HIP ARTHROPLASTY Left 01/04/2014   Procedure: LEFT TOTAL HIP ARTHROPLASTY ANTERIOR APPROACH;  Surgeon: Christopher   Kerry Fort, MD;  Location: [facility];  Service: Orthopedics;  Laterality: Left;  . TOTAL HIP ARTHROPLASTY Left 02/14/2014   Procedure: Irrigation and Debridement left hip;  Surgeon: Mcarthur Rossetti, MD;  Location: [facility];  Service: Orthopedics;  Laterality: Left;     Social History:   reports that he has been smoking cigarettes. He has a 50.00 pack-year smoking history. He has never used smokeless tobacco. He reports that he does not drink alcohol and does not use drugs.   Family History:  His family history includes Heart disease in his brother.   Allergies Allergies  Allergen Reactions  . Vancomycin     Red Man Syndrome   . Latex Other (See Comments)    "possibly allergic- dry, itching, rash"     Home Medications  Prior to Admission medications   Medication Sig Start Date End Date Taking? Authorizing Provider  Ascorbic Acid (VITAMIN C) 1000 MG tablet Take 1,000 mg by mouth daily.   Yes [provider]  fexofenadine (ALLEGRA) 180 MG tablet Take 180 mg by mouth daily.   Yes [provider]  ketoconazole (NIZORAL) 2 % cream  APPLY TO THE AFFECTED AREA ONCE A DAY. Patient taking differently: 1 application daily as needed for irritation. 10/08/20  Yes [name], MD  losartan (COZAAR) 50 MG tablet TAKE ONE TABLET ONCE DAILY Patient taking differently: Take 50 mg by mouth daily. 10/27/20  Yes [name], MD  metoprolol tartrate (LOPRESSOR) 50 MG tablet TAKE ONE TABLET BY MOUTH TWICE DAILY. Patient taking differently: Take 50 mg by mouth 2 (two) times daily. 10/20/20  Yes [name], MD  Multiple Vitamin (MULTI VITAMIN DAILY PO) Take 1 tablet by mouth daily.   Yes [provider]  Multiple Vitamins-Minerals (PRESERVISION AREDS 2) CAPS Take 1 capsule by mouth in the morning and at bedtime.   Yes [provider]  triamcinolone cream (KENALOG) 0.1 % APPLY TO AFFECTED AREA TWICE A DAY. Patient taking differently: Apply 1 application topically 2 (two) times daily as needed (itching). 10/14/20  Yes [name], MD  atorvastatin (LIPITOR) 10 MG tablet Take 1 pill Monday Wednesday and Friday. Patient not taking: No sig reported 01/24/19   [name], MD  cephALEXin (KEFLEX) 500 MG capsule Take 1 capsule (500 mg total) by mouth 3 (three) times daily. Patient not taking: No sig reported 01/11/20   Tysinger, Camelia Eng, PA-C  [place] Patient is to test one time a day DX: E11.9 07/30/16   [name], MD  [facility] VERIO test strip USE TO TEST BLOOD SUGAR ONCE DAILY. 11/21/19   [name], MD

## 2020-11-17 NOTE — Progress Notes (Signed)
elink monitoring for sepsis protocol 

## 2020-11-17 NOTE — ED Provider Notes (Signed)
[facility] DEPT Provider Note   CSN: [id number] Arrival date & time: 11/17/20  1043     History Chief Complaint  Patient presents with  . Shortness of Breath    Alex Edwards is a 85 y.o. male.  The history is provided by the patient.  Shortness of Breath Severity:  Moderate Onset quality:  Gradual Timing:  Constant Progression:  Worsening Chronicity:  New Context: URI (cough with sputum production with blood mixed into sputum)   Relieved by:  Nothing Worsened by:  Activity Associated symptoms: cough, fever and sputum production   Associated symptoms: no abdominal pain, no chest pain, no claudication, no diaphoresis, no ear pain, no headaches, no hemoptysis, no PND, no rash, no sore throat and no vomiting   Risk factors: tobacco use   Risk factors: no hx of PE/DVT        Past Medical History:  Diagnosis Date  . BCE (basal cell epithelioma)   . Bruises easily    on hands  . Cough    smokers cough or perfurmes  . Degeneration macular    right eye  . Dyslipidemia   . ED (erectile dysfunction)   . Hx of adenomatous colonic polyps   . Hyperlipidemia   . Hypertension    borderline  . Obesity   . Psoriasis   . Seasonal allergies   . Smoker    former    Patient Active Problem List   Diagnosis Date Noted  . Acute respiratory failure with hypoxia ([place]) 11/17/2020  . Aortic atherosclerosis ([place]) 02/14/2018  . Onychomycosis 09/26/2017  . Macular degeneration 01/25/2017  . History of cataract surgery 01/25/2017  . Statin intolerance 09/22/2016  . Controlled type 2 diabetes mellitus without complication, without long-term current use of insulin ([place]) 09/22/2016  . Mass of left submandibular region 07/26/2016  . Senile purpura ([place]) 01/13/2015  . Hx of adenomatous colonic polyps 01/13/2015  . Actinic keratosis of multiple sites of head and neck 12/09/2014  . Status post THR (total hip replacement) 01/04/2014  . Hypertension  associated with diabetes ([place]) 10/14/2011  . Hyperlipidemia associated with type 2 diabetes mellitus ([place]) 10/14/2011  . Psoriasis 10/14/2011  . Obesity (BMI 30-39.9) 10/14/2011  . Allergic rhinitis due to pollen 10/14/2011  . Current occasional smoker 10/14/2011    Past Surgical History:  Procedure Laterality Date  . CATARACT EXTRACTION, BILATERAL Bilateral 2013  . COLONOSCOPY  2005   [facility]  . POLYPECTOMY    . SKIN CANCER EXCISION Right 1993   under eye-MOHS, freeze multiple places freq  . TONSILLECTOMY  as child  . TOTAL HIP ARTHROPLASTY Left 01/04/2014   Procedure: LEFT TOTAL HIP ARTHROPLASTY ANTERIOR APPROACH;  Surgeon: Mcarthur Rossetti, MD;  Location: [facility] ORS;  Service: Orthopedics;  Laterality: Left;  . TOTAL HIP ARTHROPLASTY Left 02/14/2014   Procedure: Irrigation and Debridement left hip;  Surgeon: Mcarthur Rossetti, MD;  Location: [facility];  Service: Orthopedics;  Laterality: Left;       Family History  Problem Relation Age of Onset  . Heart disease Brother     Social History   Tobacco Use  . Smoking status: Current Some Day Smoker    Packs/day: 1.00    Years: 50.00    Pack years: 50.00    Types: Cigarettes    Last attempt to quit: 04/28/2014    Years since quitting: 6.5  . Smokeless tobacco: Never Used  Substance Use Topics  . Alcohol use: No  Alcohol/week: 0.0 standard drinks  . Drug use: No    Home Medications Prior to Admission medications   Medication Sig Start Date End Date Taking? Authorizing Provider  Ascorbic Acid (VITAMIN C) 1000 MG tablet Take 1,000 mg by mouth daily.    [provider]  atorvastatin (LIPITOR) 10 MG tablet Take 1 pill Monday Wednesday and Friday. Patient not taking: Reported on 01/11/2020 01/24/19   [name], MD  cephALEXin (KEFLEX) 500 MG capsule Take 1 capsule (500 mg total) by mouth 3 (three) times daily. Patient not taking: Reported on 01/29/2020 01/11/20   Tysinger, Camelia Eng, PA-C  Cranberry-Vitamin C  ([facility] CRANBERRY) 84-20 MG CAPS Take 2 tablets by mouth daily.     [provider]  fexofenadine (ALLEGRA) 180 MG tablet Take 180 mg by mouth daily.    [provider]  HYDROcodone-acetaminophen (NORCO) 5-325 MG tablet Take 1 tablet by mouth every 6 (six) hours as needed. Hold for patient request Patient not taking: Reported on 01/29/2020 01/11/20   Tysinger, Camelia Eng, PA-C  ketoconazole (NIZORAL) 2 % cream APPLY TO THE AFFECTED AREA ONCE A DAY. 10/08/20   [name], MD  losartan (COZAAR) 50 MG tablet TAKE ONE TABLET ONCE DAILY 10/27/20   [name], MD  metoprolol tartrate (LOPRESSOR) 50 MG tablet TAKE ONE TABLET BY MOUTH TWICE DAILY. 10/20/20   [name], MD  Multiple Vitamin (MULTI VITAMIN DAILY PO) Take by mouth.    [provider]  Multiple Vitamin (MULTI-VITAMIN PO) Take 1 tablet by mouth. Patient not taking: Reported on 01/11/2020    [provider]  [place] Patient is to test one time a day DX: E11.9 07/30/16   [name], MD  [facility] VERIO test strip USE TO TEST BLOOD SUGAR ONCE DAILY. 11/21/19   [name], MD  Polyethyl Glycol-Propyl Glycol (SYSTANE) 0.4-0.3 % GEL ophthalmic gel Place 1 application into both eyes.    [provider]  triamcinolone cream (KENALOG) 0.1 % APPLY TO AFFECTED AREA TWICE A DAY. 10/14/20   [name], MD    Allergies    Vancomycin and Latex  Review of Systems   Review of Systems  Constitutional: Positive for fever. Negative for chills and diaphoresis.  HENT: Negative for ear pain and sore throat.   Eyes: Negative for pain and visual disturbance.  Respiratory: Positive for cough, sputum production and shortness of breath. Negative for hemoptysis.   Cardiovascular: Negative for chest pain, palpitations, claudication and PND.  Gastrointestinal: Negative for abdominal pain and vomiting.  Genitourinary: Negative for dysuria and hematuria.  Musculoskeletal: Negative for  arthralgias and back pain.  Skin: Negative for color change and rash.  Neurological: Negative for seizures, syncope and headaches.  All other systems reviewed and are negative.   Physical Exam Updated Vital Signs   ED Triage Vitals  Enc Vitals Group     BP 11/17/20 1055 (!) 166/71     Pulse Rate 11/17/20 1055 (!) 103     Resp 11/17/20 1055 (!) 22     Temp 11/17/20 1055 98.7 F (37.1 C)     Temp Source 11/17/20 1055 Oral     SpO2 11/17/20 1055 95 %     Weight 11/17/20 1058 251 lb 5.2 oz (114 kg)     Height 11/17/20 1058 6\' 1"  (1.854 m)     Head Circumference --      Peak Flow --      Pain  Score 11/17/20 1058 0     Pain Loc --      Pain Edu? --      Excl. in [place]? --     Physical Exam Vitals and nursing note reviewed.  Constitutional:      General: He is not in acute distress.    Appearance: He is well-developed. He is not ill-appearing.  HENT:     Head: Normocephalic and atraumatic.     Mouth/Throat:     Mouth: Mucous membranes are moist.  Eyes:     Conjunctiva/sclera: Conjunctivae normal.     Pupils: Pupils are equal, round, and reactive to light.  Cardiovascular:     Rate and Rhythm: Regular rhythm. Tachycardia present.     Pulses: Normal pulses.     Heart sounds: Normal heart sounds. No murmur heard.   Pulmonary:     Effort: Tachypnea present. No respiratory distress.     Breath sounds: Decreased breath sounds present.  Abdominal:     Palpations: Abdomen is soft.     Tenderness: There is no abdominal tenderness.  Musculoskeletal:        General: Normal range of motion.     Cervical back: Normal range of motion and neck supple.     Right lower leg: No edema.     Left lower leg: No edema.  Skin:    General: Skin is warm and dry.     Capillary Refill: Capillary refill takes less than 2 seconds.  Neurological:     General: No focal deficit present.     Mental Status: He is alert.     ED Results / Procedures / Treatments   Labs (all labs ordered are  listed, but only abnormal results are displayed) Labs Reviewed  LACTIC ACID, PLASMA - Abnormal; Notable for the following components:      Result Value   Lactic Acid, Venous 2.6 (*)    All other components within normal limits  LACTIC ACID, PLASMA - Abnormal; Notable for the following components:   Lactic Acid, Venous 2.0 (*)    All other components within normal limits  COMPREHENSIVE METABOLIC PANEL - Abnormal; Notable for the following components:   Glucose, Bld 221 (*)    BUN 24 (*)    Creatinine, Ser 1.34 (*)    Calcium 8.3 (*)    Albumin 3.2 (*)    GFR, Estimated 53 (*)    All other components within normal limits  CBC WITH DIFFERENTIAL/PLATELET - Abnormal; Notable for the following components:   WBC 18.7 (*)    Hemoglobin 12.8 (*)    Neutro Abs 16.1 (*)    Monocytes Absolute 1.7 (*)    Abs Immature Granulocytes 0.14 (*)    All other components within normal limits  RESP PANEL BY RT-PCR (FLU A&B, COVID) ARPGX2  CULTURE, BLOOD (ROUTINE X 2)  CULTURE, BLOOD (ROUTINE X 2)  URINE CULTURE  PROTIME-INR  APTT  URINALYSIS, ROUTINE W REFLEX MICROSCOPIC    EKG EKG Interpretation  Date/Time:  Monday Nov 17 2020 10:57:09 EDT Ventricular Rate:  102 PR Interval:  164 QRS Duration: 90 QT Interval:  333 QTC Calculation: 434 R Axis:   12 Text Interpretation: Sinus tachycardia Confirmed by Lennice Sites ([id number]) on 11/17/2020 12:03:56 PM   Radiology CT Chest Wo Contrast  Result Date: 11/17/2020 CLINICAL DATA:  Short of breath. EXAM: CT CHEST WITHOUT CONTRAST TECHNIQUE: Multidetector CT imaging of the chest was performed following the standard protocol without IV contrast. COMPARISON:  01/03/2014 FINDINGS:  Cardiovascular: Heart size is normal. Aortic atherosclerosis. Coronary artery calcifications. No pericardial effusion. Mediastinum/Nodes: Normal appearance of the thyroid gland. The trachea appears patent and is midline. Normal appearance of the esophagus. No axillary or  supraclavicular adenopathy. Left paratracheal lymph node measures 1.4 cm, image 59/2. Subcarinal node measures 1.3 cm, image 73/2. The hilar lymph nodes are suboptimally evaluated due to lack of IV contrast material. Lungs/Pleura: Within the left upper lobe there is a large mass measuring 7.3 cm in maximum dimension, image 65/5 and image 90/6. Internal areas of gas within this mass are noted concerning for central necrosis. Surrounding interlobular septal thickening is noted within the left upper lobe concerning for lymphangitic spread of tumor. Scattered areas of tree-in-bud nodularity are identified throughout both lungs most notable within the lower lung zones. Peripheral areas of subpleural consolidation noted within the posterior and lateral right lower lobe. Upper Abdomen: No acute abnormality within the imaged portions of the upper abdomen. Nodule in the left adrenal gland measures 1.9 cm, image 157/2. Unchanged from 2015 compatible with a benign abnormality. Musculoskeletal: No chest wall mass or suspicious bone lesions identified. IMPRESSION: 1. Large left upper lobe lung mass is identified with internal areas of gas concerning for central necrosis. Findings are worrisome for primary bronchogenic carcinoma. Surrounding interlobular septal thickening is concerning for lymphangitic spread of tumor. Further evaluation with PET-CT and tissue sampling is advised. 2. Enlarged left paratracheal and subcarinal lymph nodes are identified. Cannot rule out metastatic adenopathy. 3. Scattered areas of tree-in-bud nodularity are identified throughout both lungs consistent with inflammatory or infectious bronchiolitis. 4. Peripheral areas of subpleural consolidation within the posterior and lateral right lower lobe are noted which may represent areas of atelectasis or pneumonia. 5. Aortic atherosclerosis. Coronary artery calcifications. Aortic Atherosclerosis (ICD10-I70.0). Electronically Signed   By: Kerby Moors M.D.    On: 11/17/2020 13:52   DG Chest Portable 1 View  Result Date: 11/17/2020 CLINICAL DATA:  Worsening cough, congestion, shortness of breath. Bloods tinged sputum. EXAM: PORTABLE CHEST 1 VIEW COMPARISON:  February 14, 2018. FINDINGS: Rounded/masslike 8 cm opacity in the lateral left midlung. Mild diffuse interstitial prominence. No visible pleural effusions or pneumothorax. Similar cardiomediastinal silhouette. IMPRESSION: 1. Rounded/masslike 8 cm opacity in the lateral left midlung. Recommend chest CT to evaluate for possible mass. 2. Diffuse interstitial prominence, possibly chronic bronchitic related change given similar findings on the prior. The recommend chest CT can also better characterize this finding and evaluate for superimposed interstitial edema or atypical infection. Electronically Signed   By: Margaretha Sheffield MD   On: 11/17/2020 12:05    Procedures .Critical Care Performed by: Lennice Sites, DO Authorized by: Lennice Sites, DO   Critical care provider statement:    Critical care time (minutes):  35   Critical care was necessary to treat or prevent imminent or life-threatening deterioration of the following conditions:  Sepsis   Critical care was time spent personally by me on the following activities:  Blood draw for specimens, development of treatment plan with patient or surrogate, discussions with primary provider, evaluation of patient's response to treatment, examination of patient, obtaining history from patient or surrogate, ordering and performing treatments and interventions, ordering and review of laboratory studies, ordering and review of radiographic studies, pulse oximetry, re-evaluation of patient's condition and review of old charts   I assumed direction of critical care for this patient from another provider in my specialty: no     Care discussed with: admitting provider  Medications Ordered in ED Medications  cefTRIAXone (ROCEPHIN) 2 g in sodium chloride  0.9 % 100 mL IVPB (0 g Intravenous Stopped 11/17/20 1214)  azithromycin (ZITHROMAX) 500 mg in sodium chloride 0.9 % 250 mL IVPB (0 mg Intravenous Stopped 11/17/20 1336)  metoprolol tartrate (LOPRESSOR) tablet 50 mg (has no administration in time range)  ketoconazole (NIZORAL) 2 % cream (has no administration in time range)  sodium chloride flush (NS) 0.9 % injection 3 mL (has no administration in time range)  lactated ringers bolus 1,000 mL (has no administration in time range)    And  lactated ringers bolus 1,000 mL (has no administration in time range)    And  lactated ringers bolus 500 mL (has no administration in time range)  acetaminophen (TYLENOL) tablet 650 mg (has no administration in time range)    Or  acetaminophen (TYLENOL) suppository 650 mg (has no administration in time range)  albuterol (PROVENTIL) (2.5 MG/3ML) 0.083% nebulizer solution 2.5 mg (has no administration in time range)  lactated ringers bolus 1,000 mL (0 mLs Intravenous Stopped 11/17/20 1337)    ED Course  I have reviewed the triage vital signs and the nursing notes.  Pertinent labs & imaging results that were available during my care of the patient were reviewed by me and considered in my medical decision making (see chart for details).    MDM Rules/Calculators/A&P                          Alex Edwards is an 85 year old male with history of high cholesterol, hypertension who presents the ED with cough, shortness of breath.  Patient hypoxic to the mid 80s and improved on 4 L of oxygen.  Diminished breath sounds on the left side.  Has had fever over the weekend and symptoms started 3 days ago.  Currently a smoker.  Has had some blood in his sputum.  Chest x-ray shows left-sided opacity.  We will get a CT scan to evaluate for mass but suspect that there is likely an infection here.  Patient has white count of 18.  Lactic acid to 2.6.  Sepsis work-up was initiated and patient started on broad-spectrum IV  antibiotics.  COVID test is negative.  CT scan showed findings consistent with primary bronchogenic carcinoma.  Likely surrounding infection as well.  Patient notified of these findings.  To be admitted to hospitalist for further sepsis care.  Lactic acid has improved to 2.  Patient is septic from pneumonia likely secondary to lung cancer.  This chart was dictated using voice recognition software.  Despite best efforts to proofread,  errors can occur which can change the documentation meaning.    Final Clinical Impression(s) / ED Diagnoses Final diagnoses:  Lung mass  Community acquired pneumonia, unspecified laterality  Sepsis, due to unspecified organism, unspecified whether acute organ dysfunction present [facility])    Rx / DC Orders ED Discharge Orders    None       Lennice Sites, DO 11/17/20 1511

## 2020-11-17 NOTE — H&P (View-Only) (Signed)
NAME:  Alex Edwards, MRN:  [id number], DOB:  01-Oct-1935, LOS: 0 ADMISSION DATE:  11/17/2020, CONSULTATION DATE:  11/17/2020 REFERRING MD:  Harold Hedge, MD, CHIEF COMPLAINT:  Lung mass  History of Present Illness:  The patient is an 85 year old gentleman with a history of tobacco use disorder who presents from home with 4 days of worsening shortness of breath, sputum production, and hemoptysis.  He notes that for the past 4 days he has had decreased appetite.  He denies any fevers chills night sweats or weight loss.  He has smoked no more than a pack a day for the last 60 years on and off.  He quit last week when his breathing and coughing got worse.  He does have a daily cough with mild sputum production but has been worse in the last few days.  He does not have a formal diagnosis of COPD.  He came to the ED with the symptoms and had a CT chest which demonstrated left upper lobe mass concerning for primary lung malignancy.  Pulmonary is being consulted to assist with potential bronchoscopy with biopsy of this mass.  Pertinent  Medical History  Hypertension Hyperlipidemia  Significant Hospital Events: Including procedures, antibiotic start and stop dates in addition to other pertinent events   . 5/23 presented to ED admitted to hospital medicine  Interim History / Subjective:    Objective   Blood pressure (!) 150/73, pulse 98, temperature 98.7 F (37.1 C), temperature source Oral, resp. rate 17, height _0  (1.854 m), weight 114 kg, SpO2 94 %.        Intake/Output Summary (Last 24 hours) at 11/17/2020 1632 Last data filed at 11/17/2020 1458 Gross per 24 hour  Intake 1594.17 ml  Output --  Net 1594.17 ml   Filed Weights   11/17/20 1058  Weight: 114 kg    Examination: General: Elderly, no respiratory distress, frequent coughing HENT: Mucous membranes moist, no blood in the oropharynx, he has tissues which show some blood mixed with sputum which is bright red Lungs: Diminished  bilaterally, no wheezes or crackles Cardiovascular: Regular rate and rhythm, no murmurs rubs or gallops Abdomen: Obese, soft, non tender, normal bowel sounds Extremities: No edema Neuro: Normal speech, no focal asymmetry MSK: No rashes  Labs/imaging that I havepersonally reviewed  (right click and "Reselect all SmartList Selections" daily)  Reviewed the CT scan of the chest obtained 5/23.  There is a left upper lobe mass with interlobular septal thickening concerning for lymphangitic spread this is concerning for primary lung malignancy.  There is some central cavitation.  There are some mildly enlarged lymph nodes in the left hilum as well as subcarinal area.  Lab Results  Component Value Date   INR 1.1 11/17/2020   INR 1.02 02/12/2014   INR 0.92 12/25/2013   Lab Results  Component Value Date   WBC 18.7 (H) 11/17/2020   HGB 12.8 (L) 11/17/2020   HCT 40.3 11/17/2020   MCV 95.3 11/17/2020   PLT 352 11/17/2020    EKG shows sinus tachycardia from today   Assessment & Plan:   Left upper Lobe Lung mass with lymphadenopathy Hemoptysis Tobacco use disorder  I explained to the patient that this finding is highly concerning for primary lung malignancy.  When asked if he would want to proceed with any kind of treatment for his cancer, he said that he would want to know what the options are.  After discussion of the risks and benefits of  procedure, we will proceed with bronchoscopy with endobronchial ultrasound with TBNA, bronchoalveolar lavage, brushings, and possible endobronchial biopsy.  NPO at midnight. He expressed his wishes to me that he would want chest compressions and a trial of life support but would not want to be kept alive indefinitely on the ventilator for a prolonged period of time without the likelihood for meaningful recovery. I have changed his status to full code.   Lenice Llamas, MD [facility] [place]   Labs   CBC: Recent  Labs  Lab 11/17/20 1144  WBC 18.7*  NEUTROABS 16.1*  HGB 12.8*  HCT 40.3  MCV 95.3  PLT 308    Basic Metabolic Panel: Recent Labs  Lab 11/17/20 1144  NA 136  K 4.5  CL 103  CO2 26  GLUCOSE 221*  BUN 24*  CREATININE 1.34*  CALCIUM 8.3*   GFR: Estimated Creatinine Clearance: 55.2 mL/min (A) (by C-G formula based on SCr of 1.34 mg/dL (H)). Recent Labs  Lab 11/17/20 1144 11/17/20 1330  WBC 18.7*  --   LATICACIDVEN 2.6* 2.0*    Liver Function Tests: Recent Labs  Lab 11/17/20 1144  AST 25  ALT 18  ALKPHOS 70  BILITOT 0.5  PROT 7.3  ALBUMIN 3.2*   No results for input(s): LIPASE, AMYLASE in the last 168 hours. No results for input(s): AMMONIA in the last 168 hours.  ABG No results found for: PHART, PCO2ART, PO2ART, HCO3, TCO2, ACIDBASEDEF, O2SAT   Coagulation Profile: Recent Labs  Lab 11/17/20 1144  INR 1.1    Cardiac Enzymes: No results for input(s): CKTOTAL, CKMB, CKMBINDEX, TROPONINI in the last 168 hours.  HbA1C: Hemoglobin A1C  Date/Time Value Ref Range Status  01/29/2020 11:51 AM 6.8 (A) 4.0 - 5.6 % Final  01/24/2019 12:15 PM 6.6 (A) 4.0 - 5.6 % Final   Hgb A1c MFr Bld  Date/Time Value Ref Range Status  07/26/2016 09:21 AM 6.7 (H) <5.7 % Final    Comment:      For someone without known diabetes, a hemoglobin A1c value of 6.5% or greater indicates that they may have diabetes and this should be confirmed with a follow-up test.   For someone with known diabetes, a value <7% indicates that their diabetes is well controlled and a value greater than or equal to 7% indicates suboptimal control. A1c targets should be individualized based on duration of diabetes, age, comorbid conditions, and other considerations.   Currently, no consensus exists for use of hemoglobin A1c for diagnosis of diabetes for children.       CBG: No results for input(s): GLUCAP in the last 168 hours.  Review of Systems:   Positive for shortness of breath,  cough, hemoptysis Negative for fevers chills night sweats or weight loss Negative for chest pain Otherwise 10 point review of systems reviewed and negative  Past Medical History:  He,  has a past medical history of BCE (basal cell epithelioma), Bruises easily, Cough, Degeneration macular, Dyslipidemia, ED (erectile dysfunction), adenomatous colonic polyps, Hyperlipidemia, Hypertension, Obesity, Psoriasis, Seasonal allergies, and Smoker.   Surgical History:   Past Surgical History:  Procedure Laterality Date  . CATARACT EXTRACTION, BILATERAL Bilateral 2013  . COLONOSCOPY  2005   [facility]  . POLYPECTOMY    . SKIN CANCER EXCISION Right 1993   under eye-MOHS, freeze multiple places freq  . TONSILLECTOMY  as child  . TOTAL HIP ARTHROPLASTY Left 01/04/2014   Procedure: LEFT TOTAL HIP ARTHROPLASTY ANTERIOR APPROACH;  Surgeon: Harrell Gave  Kerry Fort, MD;  Location: [facility];  Service: Orthopedics;  Laterality: Left;  . TOTAL HIP ARTHROPLASTY Left 02/14/2014   Procedure: Irrigation and Debridement left hip;  Surgeon: Mcarthur Rossetti, MD;  Location: [facility];  Service: Orthopedics;  Laterality: Left;     Social History:   reports that he has been smoking cigarettes. He has a 50.00 pack-year smoking history. He has never used smokeless tobacco. He reports that he does not drink alcohol and does not use drugs.   Family History:  His family history includes Heart disease in his brother.   Allergies Allergies  Allergen Reactions  . Vancomycin     Red Man Syndrome   . Latex Other (See Comments)    "possibly allergic- dry, itching, rash"     Home Medications  Prior to Admission medications   Medication Sig Start Date End Date Taking? Authorizing Provider  Ascorbic Acid (VITAMIN C) 1000 MG tablet Take 1,000 mg by mouth daily.   Yes [provider]  fexofenadine (ALLEGRA) 180 MG tablet Take 180 mg by mouth daily.   Yes [provider]  ketoconazole (NIZORAL) 2 % cream  APPLY TO THE AFFECTED AREA ONCE A DAY. Patient taking differently: 1 application daily as needed for irritation. 10/08/20  Yes [name], MD  losartan (COZAAR) 50 MG tablet TAKE ONE TABLET ONCE DAILY Patient taking differently: Take 50 mg by mouth daily. 10/27/20  Yes [name], MD  metoprolol tartrate (LOPRESSOR) 50 MG tablet TAKE ONE TABLET BY MOUTH TWICE DAILY. Patient taking differently: Take 50 mg by mouth 2 (two) times daily. 10/20/20  Yes [name], MD  Multiple Vitamin (MULTI VITAMIN DAILY PO) Take 1 tablet by mouth daily.   Yes [provider]  Multiple Vitamins-Minerals (PRESERVISION AREDS 2) CAPS Take 1 capsule by mouth in the morning and at bedtime.   Yes [provider]  triamcinolone cream (KENALOG) 0.1 % APPLY TO AFFECTED AREA TWICE A DAY. Patient taking differently: Apply 1 application topically 2 (two) times daily as needed (itching). 10/14/20  Yes [name], MD  atorvastatin (LIPITOR) 10 MG tablet Take 1 pill Monday Wednesday and Friday. Patient not taking: No sig reported 01/24/19   [name], MD  cephALEXin (KEFLEX) 500 MG capsule Take 1 capsule (500 mg total) by mouth 3 (three) times daily. Patient not taking: No sig reported 01/11/20   Tysinger, Camelia Eng, PA-C  [place] Patient is to test one time a day DX: E11.9 07/30/16   [name], MD  [facility] VERIO test strip USE TO TEST BLOOD SUGAR ONCE DAILY. 11/21/19   [name], MD

## 2020-11-17 NOTE — H&P (Signed)
History and Physical        Hospital Admission Note Date: 11/17/2020  Patient name: Alex Edwards Medical record number: [id number] Date of birth: January 02, 1936 Age: 85 y.o. Gender: male  PCP: [name], MD    Chief Complaint    Chief Complaint  Patient presents with  . Shortness of Breath      HPI:   This is an 85 year old male with past medical history of hypertension, hyperlipidemia, basal cell epithelioma, tobacco abuse, type 2 diabetes who presented to the ED with gradually worsening shortness of breath on exertion and rest, productive cough, hemoptysis and fever for several days.  He has been in his usual state of health until past weekend and has noted decreased appetite as well.  Says he smokes 1 pack/day of cigarettes intermittently for years and quit last week when his breathing and coughing were worsening.  Additionally, he has had left thigh discharge which is new.  His left eye does not have any pain or vision changes.  Denies chest pain, leg swelling, nausea vomiting or any other complaints   ED Course: Afebrile, tachycardic, tachypneic,  hypertensive, hypoxic (SpO2 86% on room air) placed on 4 L/min. Notable Labs: Sodium 136, K4.5, glucose 221, BUN 24, creatinine 1.34, lactic acid 2.6, WBC 18.7, Hb 12.8, platelets 352, INR 1.1, COVID-19 and flu negative. Notable Imaging: CT chest without contrast- large LUL lung mass with internal areas of gas concerning for central necrosis worrisome for bronchogenic carcinoma; Surrounding interlobular septal thickening is concerning for lymphangitic spread;  Enlarged left paratracheal and subcarinal lymph nodes or possibly metastatic adenopathy;  Scattered areas of tree-in-bud nodularity consistent with inflammatory infectious etiology and peripheral areas of subpleural consolidation possibly atelectasis or pneumonia. Patient received  ceftriaxone, azithromycin, 1 L LR bolus and LR maintenance fluid.    Vitals:   11/17/20 1421 11/17/20 1422  BP: (!) 150/73 (!) 150/73  Pulse: 99 98  Resp: 17 17  Temp:    SpO2: 94% 94%     Review of Systems:  Review of Systems  All other systems reviewed and are negative.   Medical/Social/Family History   Past Medical History: Past Medical History:  Diagnosis Date  . BCE (basal cell epithelioma)   . Bruises easily    on hands  . Cough    smokers cough or perfurmes  . Degeneration macular    right eye  . Dyslipidemia   . ED (erectile dysfunction)   . Hx of adenomatous colonic polyps   . Hyperlipidemia   . Hypertension    borderline  . Obesity   . Psoriasis   . Seasonal allergies   . Smoker    former    Past Surgical History:  Procedure Laterality Date  . CATARACT EXTRACTION, BILATERAL Bilateral 2013  . COLONOSCOPY  2005   [facility]  . POLYPECTOMY    . SKIN CANCER EXCISION Right 1993   under eye-MOHS, freeze multiple places freq  . TONSILLECTOMY  as child  . TOTAL HIP ARTHROPLASTY Left 01/04/2014   Procedure: LEFT TOTAL HIP ARTHROPLASTY ANTERIOR APPROACH;  Surgeon: Mcarthur Rossetti, MD;  Location: [facility];  Service: Orthopedics;  Laterality: Left;  . TOTAL HIP ARTHROPLASTY Left 02/14/2014  Procedure: Irrigation and Debridement left hip;  Surgeon: Mcarthur Rossetti, MD;  Location: [facility];  Service: Orthopedics;  Laterality: Left;    Medications: Prior to Admission medications   Medication Sig Start Date End Date Taking? Authorizing Provider  Ascorbic Acid (VITAMIN C) 1000 MG tablet Take 1,000 mg by mouth daily.    [provider]  atorvastatin (LIPITOR) 10 MG tablet Take 1 pill Monday Wednesday and Friday. Patient not taking: Reported on 01/11/2020 01/24/19   [name], MD  cephALEXin (KEFLEX) 500 MG capsule Take 1 capsule (500 mg total) by mouth 3 (three) times daily. Patient not taking: Reported on 01/29/2020 01/11/20   Tysinger, Camelia Eng, PA-C  Cranberry-Vitamin C ([facility] CRANBERRY) 84-20 MG CAPS Take 2 tablets by mouth daily.     [provider]  fexofenadine (ALLEGRA) 180 MG tablet Take 180 mg by mouth daily.    [provider]  HYDROcodone-acetaminophen (NORCO) 5-325 MG tablet Take 1 tablet by mouth every 6 (six) hours as needed. Hold for patient request Patient not taking: Reported on 01/29/2020 01/11/20   Tysinger, Camelia Eng, PA-C  ketoconazole (NIZORAL) 2 % cream APPLY TO THE AFFECTED AREA ONCE A DAY. 10/08/20   [name], MD  losartan (COZAAR) 50 MG tablet TAKE ONE TABLET ONCE DAILY 10/27/20   [name], MD  metoprolol tartrate (LOPRESSOR) 50 MG tablet TAKE ONE TABLET BY MOUTH TWICE DAILY. 10/20/20   [name], MD  Multiple Vitamin (MULTI VITAMIN DAILY PO) Take by mouth.    [provider]  Multiple Vitamin (MULTI-VITAMIN PO) Take 1 tablet by mouth. Patient not taking: Reported on 01/11/2020    [provider]  [place] Patient is to test one time a day DX: E11.9 07/30/16   [name], MD  [facility] VERIO test strip USE TO TEST BLOOD SUGAR ONCE DAILY. 11/21/19   [name], MD  Polyethyl Glycol-Propyl Glycol (SYSTANE) 0.4-0.3 % GEL ophthalmic gel Place 1 application into both eyes.    [provider]  triamcinolone cream (KENALOG) 0.1 % APPLY TO AFFECTED AREA TWICE A DAY. 10/14/20   [name], MD    Allergies:   Allergies  Allergen Reactions  . Vancomycin     Red Man Syndrome   . Latex Other (See Comments)    "possibly allergic- dry, itching, rash"    Social History:  reports that he has been smoking cigarettes. He has a 50.00 pack-year smoking history. He has never used smokeless tobacco. He reports that he does not drink alcohol and does not use drugs.  Family History: Family History  Problem Relation Age of Onset  . Heart disease Brother      Objective   Physical Exam: Blood pressure (!) 150/73, pulse 98,  temperature 98.7 F (37.1 C), temperature source Oral, resp. rate 17, height 6\' 1"  (1.854 m), weight 114 kg, SpO2 94 %.  Physical Exam Vitals and nursing note reviewed.  Constitutional:      Appearance: Normal appearance.  HENT:     Head: Normocephalic and atraumatic.  Eyes:     Conjunctiva/sclera: Conjunctivae normal.  Cardiovascular:     Rate and Rhythm: Normal rate and regular rhythm.  Pulmonary:     Breath sounds: Examination of the left-middle field reveals wheezing. Wheezing present.     Comments: Slight conversational dyspnea Noted scant bright red hemoptysis at bedside Abdominal:     General: Abdomen is flat.     Palpations:  Abdomen is soft.  Musculoskeletal:        General: No swelling or tenderness.  Skin:    Coloration: Skin is not jaundiced or pale.  Neurological:     Mental Status: He is alert. Mental status is at baseline.  Psychiatric:        Mood and Affect: Mood normal.        Behavior: Behavior normal.     LABS on Admission: I have personally reviewed all the labs and imaging below    Basic Metabolic Panel: Recent Labs  Lab 11/17/20 1144  NA 136  K 4.5  CL 103  CO2 26  GLUCOSE 221*  BUN 24*  CREATININE 1.34*  CALCIUM 8.3*   Liver Function Tests: Recent Labs  Lab 11/17/20 1144  AST 25  ALT 18  ALKPHOS 70  BILITOT 0.5  PROT 7.3  ALBUMIN 3.2*   No results for input(s): LIPASE, AMYLASE in the last 168 hours. No results for input(s): AMMONIA in the last 168 hours. CBC: Recent Labs  Lab 11/17/20 1144  WBC 18.7*  NEUTROABS 16.1*  HGB 12.8*  HCT 40.3  MCV 95.3  PLT 352   Cardiac Enzymes: No results for input(s): CKTOTAL, CKMB, CKMBINDEX, TROPONINI in the last 168 hours. BNP: Invalid input(s): POCBNP CBG: No results for input(s): GLUCAP in the last 168 hours.  Radiological Exams on Admission:  CT Chest Wo Contrast  Result Date: 11/17/2020 CLINICAL DATA:  Short of breath. EXAM: CT CHEST WITHOUT CONTRAST TECHNIQUE: Multidetector  CT imaging of the chest was performed following the standard protocol without IV contrast. COMPARISON:  01/03/2014 FINDINGS: Cardiovascular: Heart size is normal. Aortic atherosclerosis. Coronary artery calcifications. No pericardial effusion. Mediastinum/Nodes: Normal appearance of the thyroid gland. The trachea appears patent and is midline. Normal appearance of the esophagus. No axillary or supraclavicular adenopathy. Left paratracheal lymph node measures 1.4 cm, image 59/2. Subcarinal node measures 1.3 cm, image 73/2. The hilar lymph nodes are suboptimally evaluated due to lack of IV contrast material. Lungs/Pleura: Within the left upper lobe there is a large mass measuring 7.3 cm in maximum dimension, image 65/5 and image 90/6. Internal areas of gas within this mass are noted concerning for central necrosis. Surrounding interlobular septal thickening is noted within the left upper lobe concerning for lymphangitic spread of tumor. Scattered areas of tree-in-bud nodularity are identified throughout both lungs most notable within the lower lung zones. Peripheral areas of subpleural consolidation noted within the posterior and lateral right lower lobe. Upper Abdomen: No acute abnormality within the imaged portions of the upper abdomen. Nodule in the left adrenal gland measures 1.9 cm, image 157/2. Unchanged from 2015 compatible with a benign abnormality. Musculoskeletal: No chest wall mass or suspicious bone lesions identified. IMPRESSION: 1. Large left upper lobe lung mass is identified with internal areas of gas concerning for central necrosis. Findings are worrisome for primary bronchogenic carcinoma. Surrounding interlobular septal thickening is concerning for lymphangitic spread of tumor. Further evaluation with PET-CT and tissue sampling is advised. 2. Enlarged left paratracheal and subcarinal lymph nodes are identified. Cannot rule out metastatic adenopathy. 3. Scattered areas of tree-in-bud nodularity are  identified throughout both lungs consistent with inflammatory or infectious bronchiolitis. 4. Peripheral areas of subpleural consolidation within the posterior and lateral right lower lobe are noted which may represent areas of atelectasis or pneumonia. 5. Aortic atherosclerosis. Coronary artery calcifications. Aortic Atherosclerosis (ICD10-I70.0). Electronically Signed   By: Kerby Moors M.D.   On: 11/17/2020 13:52   DG Chest Portable  1 View  Result Date: 11/17/2020 CLINICAL DATA:  Worsening cough, congestion, shortness of breath. Bloods tinged sputum. EXAM: PORTABLE CHEST 1 VIEW COMPARISON:  February 14, 2018. FINDINGS: Rounded/masslike 8 cm opacity in the lateral left midlung. Mild diffuse interstitial prominence. No visible pleural effusions or pneumothorax. Similar cardiomediastinal silhouette. IMPRESSION: 1. Rounded/masslike 8 cm opacity in the lateral left midlung. Recommend chest CT to evaluate for possible mass. 2. Diffuse interstitial prominence, possibly chronic bronchitic related change given similar findings on the prior. The recommend chest CT can also better characterize this finding and evaluate for superimposed interstitial edema or atypical infection. Electronically Signed   By: Margaretha Sheffield MD   On: 11/17/2020 12:05      EKG: sinus tachycardia   A & P   Active Problems:   Acute respiratory failure with hypoxia (HCC)   Severe sepsis (HCC)   CAP (community acquired pneumonia)   Lung mass   Conjunctivitis   1. Acute hypoxic respiratory failure  Severe sepsis without septic shock secondary to CAP and suspect postobstructive pneumonia in the setting of newly found left upper lobe lung mass with lymphadenopathy concerning for primary lung malignancy a. Sepsis criteria: Tachycardia, tachypnea, leukocytosis, lactic acidosis, elevated creatinine, new O2 requirement and findings consistent with pneumonia on CT b. Continue ceftriaxone and azithromycin c. Will hold VTE  prophylaxis given his hemoptysis d. MRSA nares e. Discontinue LR maintenance fluid and give additional 2.5 L LR bolus to complete fluids per sepsis protocol f. PCCM consulted for bronchoscopy/biopsy, discussed with Dr. Shearon Stalls g. Follow-up blood cultures  2. Left eye conjunctivitis, suspect viral a. Artificial tears and eye care  3. Elevated creatinine a. Hold losartan b. Follow-up after IV fluids  4. Hypertension a. Hold losartan b. Continue Lopressor  5. Type 2 diabetes a. Sliding scale insulin  6. Hyperlipidemia a. Not taking his statin  7. Tobacco abuse a. Patient quit last week    DVT prophylaxis: SCDs   Code Status: Full Code  Diet: Heart healthy carb modified Family Communication: Admission, patients condition and plan of care including tests being ordered have been discussed with the patient who indicates understanding and agrees with the plan and Code Status. Patient's wife was updated  Disposition Plan: The appropriate patient status for this patient is INPATIENT. Inpatient status is judged to be reasonable and necessary in order to provide the required intensity of service to ensure the patient's safety. The patient's presenting symptoms, physical exam findings, and initial radiographic and laboratory data in the context of their chronic comorbidities is felt to place them at high risk for further clinical deterioration. Furthermore, it is not anticipated that the patient will be medically stable for discharge from the hospital within 2 midnights of admission. The following factors support the patient status of inpatient.   " The patient's presenting symptoms include hemoptysis, shortness of breath. " The worrisome physical exam findings include hemoptysis, left-sided wheeze. " The initial radiographic and laboratory data are worrisome because of lung mass. " The chronic co-morbidities include tobacco abuse, diabetes, hypertension.   * I certify that at the point of  admission it is my clinical judgment that the patient will require inpatient hospital care spanning beyond 2 midnights from the point of admission due to high intensity of service, high risk for further deterioration and high frequency of surveillance required.*  The medical decision making on this patient was of high complexity and the patient is at high risk for clinical deterioration, therefore this is a level 3  admission.  Consultants  . PCCM  Procedures  . None  Time Spent on Admission: 80 minutes    Harold Hedge, DO [facility]  11/17/2020, 5:39 PM

## 2020-11-17 NOTE — ED Triage Notes (Signed)
shob x3 days with productive cough blood tinged sputum. Pt states stopped smoking 1 week ago. Temp Saturday of 101 per patient. Home Covid test negative.

## 2020-11-18 ENCOUNTER — Encounter (HOSPITAL_COMMUNITY)
Admission: EM | Disposition: A | Discharge status: Home Health | Payer: Self-pay | Source: Home / Self Care | Attending: Family Medicine

## 2020-11-18 ENCOUNTER — Telehealth: Payer: Self-pay | Admitting: Internal Medicine

## 2020-11-18 ENCOUNTER — Inpatient Hospital Stay (HOSPITAL_COMMUNITY): Payer: Medicare Other

## 2020-11-18 ENCOUNTER — Inpatient Hospital Stay (HOSPITAL_COMMUNITY): Payer: Medicare Other | Admitting: Certified Registered Nurse Anesthetist

## 2020-11-18 ENCOUNTER — Encounter (HOSPITAL_COMMUNITY): Payer: Self-pay | Admitting: Internal Medicine

## 2020-11-18 DIAGNOSIS — R590 Localized enlarged lymph nodes: Secondary | ICD-10-CM

## 2020-11-18 DIAGNOSIS — R918 Other nonspecific abnormal finding of lung field: Secondary | ICD-10-CM | POA: Diagnosis not present

## 2020-11-18 DIAGNOSIS — J9601 Acute respiratory failure with hypoxia: Secondary | ICD-10-CM | POA: Diagnosis not present

## 2020-11-18 DIAGNOSIS — J189 Pneumonia, unspecified organism: Secondary | ICD-10-CM | POA: Diagnosis not present

## 2020-11-18 DIAGNOSIS — A419 Sepsis, unspecified organism: Secondary | ICD-10-CM | POA: Diagnosis not present

## 2020-11-18 DIAGNOSIS — C3490 Malignant neoplasm of unspecified part of unspecified bronchus or lung: Secondary | ICD-10-CM

## 2020-11-18 HISTORY — PX: ENDOBRONCHIAL ULTRASOUND: SHX5096

## 2020-11-18 HISTORY — PX: BRONCHIAL NEEDLE ASPIRATION BIOPSY: SHX5106

## 2020-11-18 HISTORY — PX: BRONCHIAL BRUSHINGS: SHX5108

## 2020-11-18 HISTORY — PX: BRONCHIAL WASHINGS: SHX5105

## 2020-11-18 LAB — CBC
HCT: 38.3 % — ABNORMAL LOW (ref 39.0–52.0)
Hemoglobin: 11.9 g/dL — ABNORMAL LOW (ref 13.0–17.0)
MCH: 30.4 pg (ref 26.0–34.0)
MCHC: 31.1 g/dL (ref 30.0–36.0)
MCV: 98 fL (ref 80.0–100.0)
Platelets: 343 10*3/uL (ref 150–400)
RBC: 3.91 MIL/uL — ABNORMAL LOW (ref 4.22–5.81)
RDW: 13.1 % (ref 11.5–15.5)
WBC: 17.8 10*3/uL — ABNORMAL HIGH (ref 4.0–10.5)
nRBC: 0 % (ref 0.0–0.2)

## 2020-11-18 LAB — BASIC METABOLIC PANEL
Anion gap: 6 (ref 5–15)
BUN: 19 mg/dL (ref 8–23)
CO2: 30 mmol/L (ref 22–32)
Calcium: 8.3 mg/dL — ABNORMAL LOW (ref 8.9–10.3)
Chloride: 102 mmol/L (ref 98–111)
Creatinine, Ser: 1.04 mg/dL (ref 0.61–1.24)
GFR, Estimated: >60 mL/min (ref >60)
Glucose, Bld: 135 mg/dL — ABNORMAL HIGH (ref 70–99)
Potassium: 4.6 mmol/L (ref 3.5–5.1)
Sodium: 138 mmol/L (ref 135–145)

## 2020-11-18 LAB — HEMOGLOBIN A1C
Hgb A1c MFr Bld: 6.4 % — ABNORMAL HIGH (ref 4.8–5.6)
Mean Plasma Glucose: 137 mg/dL

## 2020-11-18 LAB — GLUCOSE, CAPILLARY
Glucose-Capillary: 126 mg/dL — ABNORMAL HIGH (ref 70–99)
Glucose-Capillary: 118 mg/dL — ABNORMAL HIGH (ref 70–99)
Glucose-Capillary: 149 mg/dL — ABNORMAL HIGH (ref 70–99)

## 2020-11-18 SURGERY — ENDOBRONCHIAL ULTRASOUND (EBUS)
Anesthesia: Monitor Anesthesia Care

## 2020-11-18 MED ORDER — SUGAMMADEX SODIUM 200 MG/2ML IV SOLN
INTRAVENOUS | Status: DC | PRN
  Administered 2020-11-18: 200 mg via INTRAVENOUS

## 2020-11-18 MED ORDER — ROCURONIUM BROMIDE 10 MG/ML (PF) SYRINGE
PREFILLED_SYRINGE | INTRAVENOUS | Status: DC | PRN
  Administered 2020-11-18: 20 mg via INTRAVENOUS

## 2020-11-18 MED ORDER — OFLOXACIN 0.3 % OP SOLN
1.0000 [drp] | Freq: Four times a day (QID) | OPHTHALMIC | Status: DC
  Administered 2020-11-18 – 2020-11-22 (×16): 1 [drp] via OPHTHALMIC
  Filled 2020-11-18: qty 5

## 2020-11-18 MED ORDER — FENTANYL CITRATE (PF) 100 MCG/2ML IJ SOLN
INTRAMUSCULAR | Status: DC | PRN
  Administered 2020-11-18: 50 ug via INTRAVENOUS

## 2020-11-18 MED ORDER — SUCCINYLCHOLINE CHLORIDE 200 MG/10ML IV SOSY
PREFILLED_SYRINGE | INTRAVENOUS | Status: DC | PRN
  Administered 2020-11-18: 120 mg via INTRAVENOUS

## 2020-11-18 MED ORDER — PHENYLEPHRINE 40 MCG/ML (10ML) SYRINGE FOR IV PUSH (FOR BLOOD PRESSURE SUPPORT)
PREFILLED_SYRINGE | INTRAVENOUS | Status: DC | PRN
  Administered 2020-11-18: 120 ug via INTRAVENOUS
  Administered 2020-11-18: 80 ug via INTRAVENOUS

## 2020-11-18 MED ORDER — PROPOFOL 10 MG/ML IV BOLUS
INTRAVENOUS | Status: AC
  Filled 2020-11-18: qty 20

## 2020-11-18 MED ORDER — ALBUTEROL SULFATE HFA 108 (90 BASE) MCG/ACT IN AERS
INHALATION_SPRAY | RESPIRATORY_TRACT | Status: DC | PRN
  Administered 2020-11-18: 2 via RESPIRATORY_TRACT

## 2020-11-18 MED ORDER — LIDOCAINE 2% (20 MG/ML) 5 ML SYRINGE
INTRAMUSCULAR | Status: DC | PRN
  Administered 2020-11-18: 100 mg via INTRAVENOUS

## 2020-11-18 MED ORDER — FENTANYL CITRATE (PF) 100 MCG/2ML IJ SOLN
INTRAMUSCULAR | Status: AC
  Filled 2020-11-18: qty 2

## 2020-11-18 MED ORDER — PHENYLEPHRINE HCL-NACL 10-0.9 MG/250ML-% IV SOLN
INTRAVENOUS | Status: DC | PRN
  Administered 2020-11-18: 50 ug/min via INTRAVENOUS

## 2020-11-18 MED ORDER — ONDANSETRON HCL 4 MG/2ML IJ SOLN
INTRAMUSCULAR | Status: DC | PRN
  Administered 2020-11-18: 4 mg via INTRAVENOUS

## 2020-11-18 MED ORDER — DEXAMETHASONE SODIUM PHOSPHATE 10 MG/ML IJ SOLN
INTRAMUSCULAR | Status: DC | PRN
  Administered 2020-11-18: 10 mg via INTRAVENOUS

## 2020-11-18 MED ORDER — LACTATED RINGERS IV SOLN: INTRAVENOUS | Status: DC | PRN

## 2020-11-18 MED ORDER — LABETALOL HCL 5 MG/ML IV SOLN
10.0000 mg | INTRAVENOUS | Status: DC | PRN
  Administered 2020-11-20: 10 mg via INTRAVENOUS
  Filled 2020-11-18: qty 4

## 2020-11-18 MED ORDER — PROPOFOL 500 MG/50ML IV EMUL
INTRAVENOUS | Status: DC | PRN
  Administered 2020-11-18: 120 ug/kg/min via INTRAVENOUS

## 2020-11-18 MED ORDER — PROPOFOL 10 MG/ML IV BOLUS
INTRAVENOUS | Status: DC | PRN
  Administered 2020-11-18: 50 mg via INTRAVENOUS
  Administered 2020-11-18: 100 mg via INTRAVENOUS
  Administered 2020-11-18: 50 mg via INTRAVENOUS

## 2020-11-18 MED ORDER — ALBUTEROL SULFATE HFA 108 (90 BASE) MCG/ACT IN AERS
INHALATION_SPRAY | RESPIRATORY_TRACT | Status: AC
  Filled 2020-11-18: qty 6.7

## 2020-11-18 NOTE — Op Note (Signed)
[facility] Cardiopulmonary Patient Name: Alex Edwards Procedure Date: 11/18/2020 MRN: [id number] Attending MD: Senaida Ores. Shearon Stalls MD, MD Date of Birth: 06-Apr-1936 CSN: [id number] Age: 85 Admit Type: Inpatient Ethnicity: Not Hispanic or Latino Procedure:             Bronchoscopy Indications:           Left upper lobe mass Providers:             Senaida Ores. Shearon Stalls MD, MD, Baird Cancer, RN, Tyrone Apple, Technician, Christell Faith, CRNA Referring MD:           Medicines:             Monitored Anesthesia Care Complications:         none                        No immediate complications Estimated Blood Loss:  minimal                        Estimated blood loss was minimal. Procedure:      Pre-Anesthesia Assessment:      - A History and Physical has been performed. Patient meds and allergies       have been reviewed. The risks and benefits of the procedure and the       sedation options and risks were discussed with the patient. All       questions were answered and informed consent was obtained. Patient       identification and proposed procedure were verified prior to the       procedure. Mental Status Examination: alert and oriented. Airway       Examination: normal oropharyngeal airway. Respiratory Examination: poor       air movement. CV Examination: normal. ASA Grade Assessment: II - A       patient with mild systemic disease. After reviewing the risks and       benefits, the patient was deemed in satisfactory condition to undergo       the procedure. The anesthesia plan was to use monitored anesthesia care       (MAC). Immediately prior to administration of medications, the patient       was re-assessed for adequacy to receive sedatives. The heart rate,       respiratory rate, oxygen saturations, blood pressure, adequacy of       pulmonary ventilation, and response to care were monitored throughout       the procedure. The physical status of  the patient was re-assessed after       the procedure.      After obtaining informed consent, the bronchoscope was passed under       direct vision. Throughout the procedure, the patient's blood pressure,       pulse, and oxygen saturations were monitored continuously. the BF-H190       ([id number]) Olympus Bronchoscope was introduced through the mouth, via the       endotracheal tube (the patient was intubated for the procedure) and       advanced to the tracheobronchial tree. the BF-[id number] ([id number]) Olympus       EBUS scope was introduced through the mouth, via the endotracheal tube       (the patient was  intubated for the procedure) and advanced to the       tracheobronchial tree. The patient tolerated the procedure well. Total       fluoroscopy time was under 2 minutes. Findings:      The endotracheal tube is in good position. The visualized portion of the       trachea is of normal caliber. The carina is sharp. The tracheobronchial       tree was examined to at least the first subsegmental level. There were       no endobronchial lesions. Bronchial mucosa and anatomy are normal; there       are no endobronchial lesions, and no secretions. EBUS TBNA was performed       at the Station 7 and Station 4L lymph nodes with slides made and these       were sent in cell block. Next a BAL was performed in the lingula with       60cc instilled and 40cc bloody fluid out. This was sent for gram stain,       culture, cytology and cell count. Finally fluoroscopic guidance was used       to locate the left lung mass. Brushings were taken from the mass. Impression:      Left lung mass      - Left upper lobe mass      - The airway examination was normal.      - No specimens collected. Moderate Sedation:      Moderate (conscious) sedation was personally administered by an       anesthesia professional. The following parameters were monitored: oxygen       saturation, heart rate, blood pressure,  respiratory rate, EKG, adequacy       of pulmonary ventilation, and response to care. Recommendation:      Return to inpatient care and await test results. Procedure Code(s):      --- Professional ---      [phone], Bronchoscopy, rigid or flexible, including fluoroscopic guidance,       when performed; diagnostic, with cell washing, when performed (separate       procedure) Diagnosis Code(s):      --- Professional ---      R91.8, Other nonspecific abnormal finding of lung field CPT copyright 2019 American Medical Association. All rights reserved. The codes documented in this report are preliminary and upon coder review may  be revised to meet current compliance requirements. [name] MD, MD 11/18/2020 3:28:47 PM This report has been signed electronically. Number of Addenda: 0 Scope In: Scope Out:

## 2020-11-18 NOTE — Telephone Encounter (Signed)
appt scheduled for pt with Dr. Shearon Stalls 6/7 at 12pm per her request. Nothing further needed.

## 2020-11-18 NOTE — Hospital Course (Signed)
85 year old male with past medical history of hypertension, hyperlipidemia, basal cell epithelioma, tobacco abuse, type 2 diabetes who presented to the ED with gradually worsening shortness of breath on exertion and rest, productive cough, hemoptysis and fever for several days.  He has been in his usual state of health until past weekend and has noted decreased appetite as well.  Says he smokes 1 pack/day of cigarettes intermittently for years and quit last week when his breathing and coughing were worsening.  He met criteria for severe sepsis in the ED with tachycardic, tachypnea, hypoxia (SpO2 86% on room air) placed on 4 L/min in the setting of likely post-obstructive Pneumonia secondary to a newly discovered left upper lobe lung mass identified on CT chest in the ED.  Patient started on IV antibiotics for CAP and pulmonology consulted for bronchoscopy to obtain tissue diagnosis.

## 2020-11-18 NOTE — Anesthesia Procedure Notes (Signed)
Procedure Name: Intubation Date/Time: 11/18/2020 1:27 PM Performed by: West Pugh, CRNA Pre-anesthesia Checklist: Patient identified, Emergency Drugs available, Suction available, Patient being monitored and Timeout performed Patient Re-evaluated:Patient Re-evaluated prior to induction Oxygen Delivery Method: Circle system utilized Preoxygenation: Pre-oxygenation with 100% oxygen Induction Type: IV induction Ventilation: Mask ventilation without difficulty Laryngoscope Size: Mac and 4 Grade View: Grade I Tube type: Oral Tube size: 8.5 mm Number of attempts: 1 Airway Equipment and Method: Stylet Placement Confirmation: ETT inserted through vocal cords under direct vision,  positive ETCO2,  CO2 detector and breath sounds checked- equal and bilateral Secured at: 22 cm Tube secured with: Tape Dental Injury: Teeth and Oropharynx as per pre-operative assessment

## 2020-11-18 NOTE — Anesthesia Preprocedure Evaluation (Signed)
Anesthesia Evaluation  Patient identified by MRN, date of birth, ID band Patient awake    Reviewed: Allergy & Precautions, NPO status , Patient's Chart, lab work & pertinent test results  Airway Mallampati: II  TM Distance: >3 FB Neck ROM: Full    Dental no notable dental hx.    Pulmonary pneumonia, unresolved, COPD, Current Smoker,    Pulmonary exam normal breath sounds clear to auscultation + decreased breath sounds      Cardiovascular hypertension, Normal cardiovascular exam Rhythm:Regular Rate:Normal     Neuro/Psych negative neurological ROS  negative psych ROS   GI/Hepatic negative GI ROS, Neg liver ROS,   Endo/Other  diabetes  Renal/GU negative Renal ROS  negative genitourinary   Musculoskeletal negative musculoskeletal ROS (+)   Abdominal   Peds negative pediatric ROS (+)  Hematology negative hematology ROS (+)   Anesthesia Other Findings   Reproductive/Obstetrics negative OB ROS                             Anesthesia Physical Anesthesia Plan  ASA: IV  Anesthesia Plan: MAC   Post-op Pain Management:    Induction: Intravenous  PONV Risk Score and Plan: 1 and Propofol infusion and Treatment may vary due to age or medical condition  Airway Management Planned: Simple Face Mask  Additional Equipment:   Intra-op Plan:   Post-operative Plan:   Informed Consent: I have reviewed the patients History and Physical, chart, labs and discussed the procedure including the risks, benefits and alternatives for the proposed anesthesia with the patient or authorized representative who has indicated his/her understanding and acceptance.     Dental advisory given  Plan Discussed with: CRNA and Surgeon  Anesthesia Plan Comments:         Anesthesia Quick Evaluation

## 2020-11-18 NOTE — Progress Notes (Signed)
PROGRESS NOTE    Alex Edwards   JGG:[id number]  DOB: 1936/06/21  PCP: [name], MD    DOA: 11/17/2020 LOS: 1   Brief Narrative   85 year old male with past medical history of hypertension, hyperlipidemia, basal cell epithelioma, tobacco abuse, type 2 diabetes who presented to the ED with gradually worsening shortness of breath on exertion and rest, productive cough, hemoptysis and fever for several days.  He has been in his usual state of health until past weekend and has noted decreased appetite as well.  Says he smokes 1 pack/day of cigarettes intermittently for years and quit last week when his breathing and coughing were worsening.  He met criteria for severe sepsis in the ED with tachycardic, tachypnea, hypoxia (SpO2 86% on room air) placed on 4 L/min in the setting of likely post-obstructive Pneumonia secondary to a newly discovered left upper lobe lung mass identified on CT chest in the ED.  Patient started on IV antibiotics for CAP and pulmonology consulted for bronchoscopy to obtain tissue diagnosis.   Assessment & Plan   Active Problems:   Acute respiratory failure with hypoxia (HCC)   Severe sepsis (HCC)   CAP (community acquired pneumonia)   Lung mass   Conjunctivitis   Acute respiratory failure with hypoxia due to Severe sepsis secondary to CAP, postobstructive pneumonia in setting of Left upper lobe lung mass with lymphadenopathy  concerning for primary lung malignancy. Hemoptysis - due to mass Severe sepsis POA: Tachycardia, tachypnea, leukocytosis, lactic acidosis, AKI, new oxygen requirement with pneumonia seen on CT chest. -- Continue Rocephin and Zithromax --Follow-up cultures -- Dr. Shearon Stalls with PCCM is consulted. Taking patient for bronchoscopy/EBUS today to hopefully obtain tissue diagnosis. -- Discussed with oncology, they will see patient -- Supplement oxygen as needed, maintain sat over 90%, wean as tolerated   Left eye conjunctivitis -cold  compresses, artificial tears, Ocuflox drops.  Elevated creatinine -presented with Cr 1.34, improved to 1.04 with IV fluids.  Monitor BMP.  Hypertension -uncontrolled.   -- Losartan held due to renal function, continue to hold until after procedure, resume when indicated -- continue Lopressor -- As needed labetalol  Type 2 diabetes -sliding scale NovoLog  Hyperlipidemia -not on statin  Tobacco use disorder -1 PPD smoking history, quit last week due to symptoms outlined above.   Obesity: Body mass index is 33.16 kg/m.  Complicates overall care and prognosis.     DVT prophylaxis: SCDs Start: 11/17/20 1454   Diet:  Diet Orders (From admission, onward)    Start     Ordered   11/18/20 0001  Diet NPO time specified  Diet effective midnight        11/17/20 1848            Code Status: Full Code    Subjective 11/18/20    Patient seen prior to going for bronchoscopy this morning.  He reports feeling okay.  Shortness of breath little better.  No other acute complaints including chest pain, nausea vomiting, abdominal pain, fevers or chills.   Disposition Plan & Communication   Status is: Inpatient  Remains inpatient appropriate because:IV treatments appropriate due to intensity of illness or inability to take PO, ongoing diagnostic evaluation and procedures planned.   Dispo: The patient is from: Home              Anticipated d/c is to: Home              Patient currently is not medically stable  to d/c.   Difficult to place patient No    Consults, Procedures, Significant Events   Consultants:   PCCM  Procedures:   Bronchoscopy/EBUS on 5/24 with Dr. Shearon Stalls  Antimicrobials:  Anti-infectives (From admission, onward)   Start     Dose/Rate Route Frequency Ordered Stop   11/17/20 1145  cefTRIAXone (ROCEPHIN) 2 g in sodium chloride 0.9 % 100 mL IVPB        2 g 200 mL/hr over 30 Minutes Intravenous Every 24 hours 11/17/20 1130     11/17/20 1145  azithromycin  (ZITHROMAX) 500 mg in sodium chloride 0.9 % 250 mL IVPB        500 mg 250 mL/hr over 60 Minutes Intravenous Every 24 hours 11/17/20 1130          Micro    Objective   Vitals:   11/17/20 2000 11/17/20 2048 11/17/20 2356 11/18/20 0432  BP: (!) 159/66 (!) 168/70 (!) 148/73 121/62  Pulse: (!) 105 (!) 111 93 89  Resp: (!) 28 (!) 22 (!) 24 (!) 24  Temp:  98.1 F (36.7 C) 98.5 F (36.9 C) 97.6 F (36.4 C)  TempSrc:  Oral  Oral  SpO2: 97% 94% 93% 93%  Weight:      Height:        Intake/Output Summary (Last 24 hours) at 11/18/2020 0744 Last data filed at 11/18/2020 0700 Gross per 24 hour  Intake 3594.17 ml  Output 800 ml  Net 2794.17 ml   Filed Weights   11/17/20 1058  Weight: 114 kg    Physical Exam:  General exam: awake, alert, no acute distress HEENT: Bilateral conjunctival erythema, worse on the left, moist mucus membranes, hearing grossly normal  Respiratory system: normal respiratory effort, symmetric chest rise, no accessory muscle use or other signs respiratory distress. Cardiovascular system: no JVD, no pedal edema.   Central nervous system: A&O x3. no gross focal neurologic deficits, normal speech Skin: dry, intact, normal temperature Psychiatry: normal mood, congruent affect, judgement and insight appear normal  Labs   Data Reviewed: I have personally reviewed following labs and imaging studies  CBC: Recent Labs  Lab 11/17/20 1144 11/18/20 0443  WBC 18.7* 17.8*  NEUTROABS 16.1*  --   HGB 12.8* 11.9*  HCT 40.3 38.3*  MCV 95.3 98.0  PLT 352 354   Basic Metabolic Panel: Recent Labs  Lab 11/17/20 1144 11/18/20 0443  NA 136 138  K 4.5 4.6  CL 103 102  CO2 26 30  GLUCOSE 221* 135*  BUN 24* 19  CREATININE 1.34* 1.04  CALCIUM 8.3* 8.3*   GFR: Estimated Creatinine Clearance: 71.2 mL/min (by C-G formula based on SCr of 1.04 mg/dL). Liver Function Tests: Recent Labs  Lab 11/17/20 1144  AST 25  ALT 18  ALKPHOS 70  BILITOT 0.5  PROT 7.3   ALBUMIN 3.2*   No results for input(s): LIPASE, AMYLASE in the last 168 hours. No results for input(s): AMMONIA in the last 168 hours. Coagulation Profile: Recent Labs  Lab 11/17/20 1144  INR 1.1   Cardiac Enzymes: No results for input(s): CKTOTAL, CKMB, CKMBINDEX, TROPONINI in the last 168 hours. BNP (last 3 results) No results for input(s): PROBNP in the last 8760 hours. HbA1C: No results for input(s): HGBA1C in the last 72 hours. CBG: Recent Labs  Lab 11/17/20 2051 11/18/20 0743  GLUCAP 175* 126*   Lipid Profile: No results for input(s): CHOL, HDL, LDLCALC, TRIG, CHOLHDL, LDLDIRECT in the last 72 hours. Thyroid Function Tests: No  results for input(s): TSH, T4TOTAL, FREET4, T3FREE, THYROIDAB in the last 72 hours. Anemia Panel: No results for input(s): VITAMINB12, FOLATE, FERRITIN, TIBC, IRON, RETICCTPCT in the last 72 hours. Sepsis Labs: Recent Labs  Lab 11/17/20 1144 11/17/20 1330  LATICACIDVEN 2.6* 2.0*    Recent Results (from the past 240 hour(s))  Resp Panel by RT-PCR (Flu A&B, Covid) Nasopharyngeal Swab     Status: None   Collection Time: 11/17/20 11:16 AM   Specimen: Nasopharyngeal Swab; Nasopharyngeal(NP) swabs in vial transport medium  Result Value Ref Range Status   SARS Coronavirus 2 by RT PCR NEGATIVE NEGATIVE Final    Comment: (NOTE) SARS-CoV-2 target nucleic acids are NOT DETECTED.  The SARS-CoV-2 RNA is generally detectable in upper respiratory specimens during the acute phase of infection. The lowest concentration of SARS-CoV-2 viral copies this assay can detect is 138 copies/mL. A negative result does not preclude SARS-Cov-2 infection and should not be used as the sole basis for treatment or other patient management decisions. A negative result may occur with  improper specimen collection/handling, submission of specimen other than nasopharyngeal swab, presence of viral mutation(s) within the areas targeted by this assay, and inadequate number  of viral copies(<138 copies/mL). A negative result must be combined with clinical observations, patient history, and epidemiological information. The expected result is Negative.  Fact Sheet for Patients:  EntrepreneurPulse.com.au  Fact Sheet for Healthcare Providers:  IncredibleEmployment.be  This test is no t yet approved or cleared by the [place] FDA and  has been authorized for detection and/or diagnosis of SARS-CoV-2 by FDA under an Emergency Use Authorization (EUA). This EUA will remain  in effect (meaning this test can be used) for the duration of the COVID-19 declaration under Section 564(b)(1) of the Act, 21 U.S.C.section 360bbb-3(b)(1), unless the authorization is terminated  or revoked sooner.       Influenza A by PCR NEGATIVE NEGATIVE Final   Influenza B by PCR NEGATIVE NEGATIVE Final    Comment: (NOTE) The Xpert Xpress SARS-CoV-2/FLU/RSV plus assay is intended as an aid in the diagnosis of influenza from Nasopharyngeal swab specimens and should not be used as a sole basis for treatment. Nasal washings and aspirates are unacceptable for Xpert Xpress SARS-CoV-2/FLU/RSV testing.  Fact Sheet for Patients: EntrepreneurPulse.com.au  Fact Sheet for Healthcare Providers: IncredibleEmployment.be  This test is not yet approved or cleared by the [place] FDA and has been authorized for detection and/or diagnosis of SARS-CoV-2 by FDA under an Emergency Use Authorization (EUA). This EUA will remain in effect (meaning this test can be used) for the duration of the COVID-19 declaration under Section 564(b)(1) of the Act, 21 U.S.C. section 360bbb-3(b)(1), unless the authorization is terminated or revoked.  Performed at [facility], [place] [address], [place], [place] [address]   MRSA PCR Screening     Status: None   Collection Time: 11/17/20  5:35 PM   Specimen:  Nasopharyngeal  Result Value Ref Range Status   MRSA by PCR NEGATIVE NEGATIVE Final    Comment:        The GeneXpert MRSA Assay (FDA approved for NASAL specimens only), is one component of a comprehensive MRSA colonization surveillance program. It is not intended to diagnose MRSA infection nor to guide or monitor treatment for MRSA infections. Performed at [facility], [place] [address] [place], [place] [address]       Imaging Studies   CT Chest Wo Contrast  Result Date: 11/17/2020 CLINICAL DATA:  Short of  breath. EXAM: CT CHEST WITHOUT CONTRAST TECHNIQUE: Multidetector CT imaging of the chest was performed following the standard protocol without IV contrast. COMPARISON:  01/03/2014 FINDINGS: Cardiovascular: Heart size is normal. Aortic atherosclerosis. Coronary artery calcifications. No pericardial effusion. Mediastinum/Nodes: Normal appearance of the thyroid gland. The trachea appears patent and is midline. Normal appearance of the esophagus. No axillary or supraclavicular adenopathy. Left paratracheal lymph node measures 1.4 cm, image 59/2. Subcarinal node measures 1.3 cm, image 73/2. The hilar lymph nodes are suboptimally evaluated due to lack of IV contrast material. Lungs/Pleura: Within the left upper lobe there is a large mass measuring 7.3 cm in maximum dimension, image 65/5 and image 90/6. Internal areas of gas within this mass are noted concerning for central necrosis. Surrounding interlobular septal thickening is noted within the left upper lobe concerning for lymphangitic spread of tumor. Scattered areas of tree-in-bud nodularity are identified throughout both lungs most notable within the lower lung zones. Peripheral areas of subpleural consolidation noted within the posterior and lateral right lower lobe. Upper Abdomen: No acute abnormality within the imaged portions of the upper abdomen. Nodule in the left adrenal gland measures 1.9 cm, image 157/2. Unchanged  from 2015 compatible with a benign abnormality. Musculoskeletal: No chest wall mass or suspicious bone lesions identified. IMPRESSION: 1. Large left upper lobe lung mass is identified with internal areas of gas concerning for central necrosis. Findings are worrisome for primary bronchogenic carcinoma. Surrounding interlobular septal thickening is concerning for lymphangitic spread of tumor. Further evaluation with PET-CT and tissue sampling is advised. 2. Enlarged left paratracheal and subcarinal lymph nodes are identified. Cannot rule out metastatic adenopathy. 3. Scattered areas of tree-in-bud nodularity are identified throughout both lungs consistent with inflammatory or infectious bronchiolitis. 4. Peripheral areas of subpleural consolidation within the posterior and lateral right lower lobe are noted which may represent areas of atelectasis or pneumonia. 5. Aortic atherosclerosis. Coronary artery calcifications. Aortic Atherosclerosis (ICD10-I70.0). Electronically Signed   By: Kerby Moors M.D.   On: 11/17/2020 13:52   DG Chest Portable 1 View  Result Date: 11/17/2020 CLINICAL DATA:  Worsening cough, congestion, shortness of breath. Bloods tinged sputum. EXAM: PORTABLE CHEST 1 VIEW COMPARISON:  February 14, 2018. FINDINGS: Rounded/masslike 8 cm opacity in the lateral left midlung. Mild diffuse interstitial prominence. No visible pleural effusions or pneumothorax. Similar cardiomediastinal silhouette. IMPRESSION: 1. Rounded/masslike 8 cm opacity in the lateral left midlung. Recommend chest CT to evaluate for possible mass. 2. Diffuse interstitial prominence, possibly chronic bronchitic related change given similar findings on the prior. The recommend chest CT can also better characterize this finding and evaluate for superimposed interstitial edema or atypical infection. Electronically Signed   By: Margaretha Sheffield MD   On: 11/17/2020 12:05     Medications   Scheduled Meds: . albuterol      .  insulin aspart  0-9 Units Subcutaneous TID WC  . metoprolol tartrate  50 mg Oral BID  . polyvinyl alcohol  1 drop Left Eye TID  . sodium chloride flush  3 mL Intravenous Q12H   Continuous Infusions: . azithromycin Stopped (11/17/20 1336)  . cefTRIAXone (ROCEPHIN)  IV Stopped (11/17/20 1214)       LOS: 1 day    Time spent: 30 minutes    Ezekiel Slocumb, DO [facility]  11/18/2020, 7:44 AM      If 7PM-7AM, please contact night-coverage. How to contact the [facility] Attending or Consulting provider [place] or covering provider during after hours [place], for  this patient?    1. Check the care team in [facility] and look for a) attending/consulting [facility] provider listed and b) the [facility] team listed 2. Log into [email or web address] and use [place]'s universal password to access. If you do not have the password, please contact the hospital operator. 3. Locate the [facility] provider you are looking for under [facility] and page to a number that you can be directly reached. 4. If you still have difficulty reaching the provider, please page the [facility] (Director on Call) for the Hospitalists listed on amion for assistance.

## 2020-11-18 NOTE — Transfer of Care (Signed)
Immediate Anesthesia Transfer of Care Note  Patient: Alex Edwards  Procedure(s) Performed: ENDOBRONCHIAL ULTRASOUND (N/A ) BRONCHIAL NEEDLE ASPIRATION BIOPSIES BRONCHIAL WASHINGS BRONCHIAL BRUSHINGS  Patient Location: Endoscopy Unit  Anesthesia Type:General  Level of Consciousness: drowsy  Airway & Oxygen Therapy: Patient Spontanous Breathing and Patient connected to face mask oxygen  Post-op Assessment: Report given to RN and Post -op Vital signs reviewed and stable  Post vital signs: Reviewed and stable  Last Vitals:  Vitals Value Taken Time  BP    Temp    Pulse 97 11/18/20 1603  Resp 19 11/18/20 1603  SpO2 96 % 11/18/20 1603  Vitals shown include unvalidated device data.  Last Pain:  Vitals:   11/18/20 1222  TempSrc: Oral  PainSc: 7          Complications: No complications documented.

## 2020-11-18 NOTE — Telephone Encounter (Signed)
Needs hospital follow up

## 2020-11-18 NOTE — Anesthesia Postprocedure Evaluation (Signed)
Anesthesia Post Note  Patient: Alex Edwards  Procedure(s) Performed: ENDOBRONCHIAL ULTRASOUND (N/A ) BRONCHIAL NEEDLE ASPIRATION BIOPSIES BRONCHIAL WASHINGS BRONCHIAL BRUSHINGS     Patient location during evaluation: PACU Anesthesia Type: General Level of consciousness: awake and alert Pain management: pain level controlled Vital Signs Assessment: post-procedure vital signs reviewed and stable Respiratory status: spontaneous breathing, nonlabored ventilation, respiratory function stable and patient connected to nasal cannula oxygen Cardiovascular status: blood pressure returned to baseline and stable Postop Assessment: no apparent nausea or vomiting Anesthetic complications: no   No complications documented.  Last Vitals:  Vitals:   11/18/20 1615 11/18/20 1625  BP: (!) 155/137 (!) 185/60  Pulse: (!) 106 98  Resp: (!) 30 20  Temp:    SpO2: (!) 75% 100%    Last Pain:  Vitals:   11/18/20 1625  TempSrc:   PainSc: 0-No pain                 Fionna Merriott S

## 2020-11-18 NOTE — Interval H&P Note (Signed)
History and Physical Interval Note:  11/18/2020 8:34 AM  Alex Edwards  has presented today for surgery, with the diagnosis of Lung Mass.  The various methods of treatment have been discussed with the patient and family. After consideration of risks, benefits and other options for treatment, the patient has consented to  Procedure(s): ENDOBRONCHIAL ULTRASOUND (N/A) as a surgical intervention.  The patient's history has been reviewed, patient examined, no change in status, stable for surgery.  I have reviewed the patient's chart and labs.  Questions were answered to the patient's satisfaction.     Spero Geralds

## 2020-11-19 ENCOUNTER — Encounter (HOSPITAL_COMMUNITY): Payer: Self-pay | Admitting: Internal Medicine

## 2020-11-19 ENCOUNTER — Inpatient Hospital Stay (HOSPITAL_COMMUNITY): Payer: Medicare Other

## 2020-11-19 DIAGNOSIS — I6521 Occlusion and stenosis of right carotid artery: Secondary | ICD-10-CM | POA: Diagnosis not present

## 2020-11-19 DIAGNOSIS — J189 Pneumonia, unspecified organism: Secondary | ICD-10-CM | POA: Diagnosis not present

## 2020-11-19 DIAGNOSIS — J9601 Acute respiratory failure with hypoxia: Secondary | ICD-10-CM | POA: Diagnosis not present

## 2020-11-19 DIAGNOSIS — R590 Localized enlarged lymph nodes: Secondary | ICD-10-CM | POA: Diagnosis not present

## 2020-11-19 DIAGNOSIS — R918 Other nonspecific abnormal finding of lung field: Secondary | ICD-10-CM | POA: Diagnosis not present

## 2020-11-19 DIAGNOSIS — C3492 Malignant neoplasm of unspecified part of left bronchus or lung: Secondary | ICD-10-CM

## 2020-11-19 LAB — URINE CULTURE: Culture: NO GROWTH

## 2020-11-19 LAB — BLOOD GAS, VENOUS
Acid-Base Excess: 1.2 mmol/L (ref 0.0–2.0)
Bicarbonate: 26.9 mmol/L (ref 20.0–28.0)
O2 Saturation: 92.8 %
Patient temperature: 98.6
pCO2, Ven: 49.8 mmHg (ref 44.0–60.0)
pH, Ven: 7.352 (ref 7.250–7.430)
pO2, Ven: 65.4 mmHg — ABNORMAL HIGH (ref 32.0–45.0)

## 2020-11-19 LAB — BASIC METABOLIC PANEL
Anion gap: 10 (ref 5–15)
BUN: 33 mg/dL — ABNORMAL HIGH (ref 8–23)
CO2: 26 mmol/L (ref 22–32)
Calcium: 8.5 mg/dL — ABNORMAL LOW (ref 8.9–10.3)
Chloride: 103 mmol/L (ref 98–111)
Creatinine, Ser: 0.96 mg/dL (ref 0.61–1.24)
GFR, Estimated: >60 mL/min (ref >60)
Glucose, Bld: 217 mg/dL — ABNORMAL HIGH (ref 70–99)
Potassium: 5.1 mmol/L (ref 3.5–5.1)
Sodium: 139 mmol/L (ref 135–145)

## 2020-11-19 LAB — CYTOLOGY - NON PAP

## 2020-11-19 LAB — CBC
HCT: 40.5 % (ref 39.0–52.0)
Hemoglobin: 12.5 g/dL — ABNORMAL LOW (ref 13.0–17.0)
MCH: 30.6 pg (ref 26.0–34.0)
MCHC: 30.9 g/dL (ref 30.0–36.0)
MCV: 99 fL (ref 80.0–100.0)
Platelets: 337 10*3/uL (ref 150–400)
RBC: 4.09 MIL/uL — ABNORMAL LOW (ref 4.22–5.81)
RDW: 13.1 % (ref 11.5–15.5)
WBC: 15.9 10*3/uL — ABNORMAL HIGH (ref 4.0–10.5)
nRBC: 0 % (ref 0.0–0.2)

## 2020-11-19 LAB — GLUCOSE, CAPILLARY
Glucose-Capillary: 143 mg/dL — ABNORMAL HIGH (ref 70–99)
Glucose-Capillary: 154 mg/dL — ABNORMAL HIGH (ref 70–99)
Glucose-Capillary: 132 mg/dL — ABNORMAL HIGH (ref 70–99)
Glucose-Capillary: 117 mg/dL — ABNORMAL HIGH (ref 70–99)

## 2020-11-19 NOTE — Plan of Care (Signed)
Pt alert, continues on 6L [place] and placed on CPAP for the night, no respiratory distress noted and will continue plan of care.  Problem: Health Behavior/Discharge Planning: Goal: Ability to manage health-related needs will improve Outcome: Progressing   Problem: Clinical Measurements: Goal: Respiratory complications will improve Outcome: Progressing   Problem: Clinical Measurements: Goal: Ability to maintain clinical measurements within normal limits will improve Outcome: Progressing   Problem: Clinical Measurements: Goal: Will remain free from infection Outcome: Progressing

## 2020-11-19 NOTE — Consult Note (Signed)
Chief Complaint: Patient was seen in consultation today for image guided left neck mass biopsy Chief Complaint  Patient presents with  . Shortness of Breath    Referring Physician(s): Mohamed,M  Supervising Physician: Sandi Mariscal  Patient Status: [facility]  History of Present Illness: Alex Edwards is an 85 y.o. male with history of diabetes, macular degeneration, dyslipidemia, hyperlipidemia, hypertension, obesity, psoriasis, tobacco abuse and recently diagnosed left lung cancer/squamous cell carcinoma.  Patient also has history of a left neck mass which reportedly has been present for over 30 years, nontender.  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  Request now received from oncology for image guided biopsy of this left neck mass.  Past Medical History:  Diagnosis Date  . BCE (basal cell epithelioma)   . Bruises easily    on hands  . Cough    smokers cough or perfurmes  . Degeneration macular    right eye  . Diabetes mellitus without complication ([place])   . Dyslipidemia   . ED (erectile dysfunction)   . Hx of adenomatous colonic polyps   . Hyperlipidemia   . Hypertension    borderline  . Obesity   . Psoriasis   . Seasonal allergies   . Smoker    former    Past Surgical History:  Procedure Laterality Date  . CATARACT EXTRACTION, BILATERAL Bilateral 2013  . COLONOSCOPY  2005   [facility]  . POLYPECTOMY    . SKIN CANCER EXCISION Right 1993   under eye-MOHS, freeze multiple places freq  . TONSILLECTOMY  as child  . TOTAL HIP ARTHROPLASTY Left 01/04/2014   Procedure: LEFT TOTAL HIP ARTHROPLASTY ANTERIOR APPROACH;  Surgeon: Mcarthur Rossetti, MD;  Location: [facility];  Service: Orthopedics;  Laterality: Left;  . TOTAL HIP ARTHROPLASTY Left 02/14/2014   Procedure: Irrigation and Debridement  left hip;  Surgeon: Mcarthur Rossetti, MD;  Location: [facility];  Service: Orthopedics;  Laterality: Left;    Allergies: Vancomycin and Latex  Medications: Prior to Admission medications   Medication Sig Start Date End Date Taking? Authorizing Provider  Ascorbic Acid (VITAMIN C) 1000 MG tablet Take 1,000 mg by mouth daily.   Yes [provider]  fexofenadine (ALLEGRA) 180 MG tablet Take 180 mg by mouth daily.   Yes [provider]  ketoconazole (NIZORAL) 2 % cream APPLY TO THE AFFECTED AREA ONCE A DAY. Patient taking differently: 1 application daily as needed for irritation. 10/08/20  Yes [name], MD  losartan (COZAAR) 50 MG tablet TAKE ONE TABLET ONCE DAILY Patient taking differently: Take 50 mg by mouth daily. 10/27/20  Yes [name], MD  metoprolol tartrate (LOPRESSOR) 50 MG tablet TAKE ONE TABLET BY MOUTH TWICE DAILY. Patient taking differently: Take 50 mg by mouth 2 (two) times daily. 10/20/20  Yes [name], MD  Multiple Vitamin (MULTI VITAMIN DAILY PO) Take 1 tablet by mouth daily.   Yes [provider]  Multiple Vitamins-Minerals (PRESERVISION AREDS 2) CAPS Take 1 capsule by mouth in the morning and at bedtime.   Yes [provider]  triamcinolone cream (KENALOG) 0.1 % APPLY TO AFFECTED AREA TWICE A DAY. Patient taking differently: Apply 1 application topically 2 (two) times daily as needed (itching). 10/14/20  Yes [name], MD  atorvastatin (LIPITOR) 10 MG tablet Take  1 pill Monday Wednesday and Friday. Patient not taking: No sig reported 01/24/19   [name], MD  cephALEXin (KEFLEX) 500 MG capsule Take 1 capsule (500 mg total) by mouth 3 (three) times daily. Patient not taking: No sig reported 01/11/20   Tysinger, Camelia Eng, PA-C  [place] Patient is to test one time a day DX: E11.9 07/30/16   [name], MD  [facility] VERIO test strip USE TO TEST BLOOD SUGAR ONCE DAILY. 11/21/19   [name], MD     Family History  Problem Relation Age of Onset  . Heart disease Brother     Social History   Socioeconomic History  . Marital status: Married    Spouse name: Not on file  . Number of children: Not on file  . Years of education: Not on file  . Highest education level: Not on file  Occupational History  . Not on file  Tobacco Use  . Smoking status: Current Some Day Smoker    Packs/day: 1.00    Years: 50.00    Pack years: 50.00    Types: Cigarettes    Last attempt to quit: 04/28/2014    Years since quitting: 6.5  . Smokeless tobacco: Never Used  Substance and Sexual Activity  . Alcohol use: No    Alcohol/week: 0.0 standard drinks  . Drug use: No  . Sexual activity: Yes  Other Topics Concern  . Not on file  Social History Narrative  . Not on file   Social Determinants of Health   Financial Resource Strain: Not on file  Food Insecurity: Not on file  Transportation Needs: Not on file  Physical Activity: Not on file  Stress: Not on file  Social Connections: Not on file      Review of Systems currently denies fever, headache, chest pain, abdominal/back pain, nausea, vomiting.  He does have some chronic dyspnea with exertion, occasional cough with some blood-tinge to the sputum.  Vital Signs: BP 128/65   Pulse 87   Temp 97.8 F (36.6 C) (Oral)   Resp 20   Ht 6\' 1"  (1.854 m)   Wt 238 lb (108 kg)   SpO2 99%   BMI 31.40 kg/m   Physical Exam awake, alert.  Chest with diminished breath sounds bilaterally.   Heart with regular rate and rhythm.  Abdomen soft, obese, positive bowel sounds, nontender.  No lower extremity edema.  Palpable left neck mass, nontender to palpation  Imaging: CT SOFT TISSUE NECK WO CONTRAST  Result Date: 11/19/2020 CLINICAL DATA:  Parotid region mass. Additional history provided: Patient reports lump under left jaw for 30+ years. EXAM: CT NECK WITHOUT CONTRAST TECHNIQUE: Multidetector CT imaging of the neck was performed  following the standard protocol without intravenous contrast. COMPARISON:  Chest CT 11/17/2020. FINDINGS: Mildly motion degraded exam. Pharynx and larynx: No appreciable swelling or discrete mass within the oral cavity, pharynx or larynx on this noncontrast examination. Salivary glands: 6.1 x 2.9 cm ovoid slightly heterogeneous soft tissue focus along the inferior aspect of the left parotid gland, extending to the left level 2 station and overlying the anterior aspect of the left sternocleidomastoid muscle. The right parotid and bilateral submandibular glands are unremarkable. Thyroid: Unremarkable. Lymph nodes: Please refer to salivary gland findings above. Elsewhere within the neck, no pathologically enlarged cervical chain lymph nodes are identified. Vascular: There is limited assessment of the major vascular structures of the neck in the absence of intravenous contrast. Calcified  atherosclerotic plaque within the visualized aortic arch, proximal major branch vessels of the neck and carotid arteries. Calcified plaque within the proximal right ICA could result in a hemodynamically significant stenosis. Limited intracranial: No acute intracranial abnormality identified within the field of view. Visualized orbits: Incompletely imaged. No mass or acute finding at the imaged levels. Mastoids and visualized paranasal sinuses: No significant paranasal sinus disease or mastoid effusion at the imaged levels. Skeleton: Reversal of the expected cervical lordosis. Mild C6-C7 and C7-T1 grade 1 anterolisthesis. Cervical spondylosis. No acute bony abnormality or aggressive osseous lesion identified. Upper chest: Incompletely imaged known large left upper lobe lung mass. IMPRESSION: 6.1 x 2.9 cm ovoid soft tissue focus within the left upper neck, along the inferior aspect of the left parotid gland. Favored differential considerations are primary parotid neoplasm versus nodal metastatic disease. Direct tissue sampling should be  considered. Elsewhere within the neck, no pathologically enlarged lymph nodes are identified. Partially imaged known left upper lobe pulmonary mass suspicious for bronchogenic carcinoma. Atherosclerotic disease as described. Notably, calcified plaque at the origin of the right ICA could result in a hemodynamically significant stenosis. Carotid artery duplex recommended for further evaluation. Cervical spondylosis. Electronically Signed   By: Kellie Simmering DO   On: 11/19/2020 13:25   CT Chest Wo Contrast  Result Date: 11/17/2020 CLINICAL DATA:  Short of breath. EXAM: CT CHEST WITHOUT CONTRAST TECHNIQUE: Multidetector CT imaging of the chest was performed following the standard protocol without IV contrast. COMPARISON:  01/03/2014 FINDINGS: Cardiovascular: Heart size is normal. Aortic atherosclerosis. Coronary artery calcifications. No pericardial effusion. Mediastinum/Nodes: Normal appearance of the thyroid gland. The trachea appears patent and is midline. Normal appearance of the esophagus. No axillary or supraclavicular adenopathy. Left paratracheal lymph node measures 1.4 cm, image 59/2. Subcarinal node measures 1.3 cm, image 73/2. The hilar lymph nodes are suboptimally evaluated due to lack of IV contrast material. Lungs/Pleura: Within the left upper lobe there is a large mass measuring 7.3 cm in maximum dimension, image 65/5 and image 90/6. Internal areas of gas within this mass are noted concerning for central necrosis. Surrounding interlobular septal thickening is noted within the left upper lobe concerning for lymphangitic spread of tumor. Scattered areas of tree-in-bud nodularity are identified throughout both lungs most notable within the lower lung zones. Peripheral areas of subpleural consolidation noted within the posterior and lateral right lower lobe. Upper Abdomen: No acute abnormality within the imaged portions of the upper abdomen. Nodule in the left adrenal gland measures 1.9 cm, image 157/2.  Unchanged from 2015 compatible with a benign abnormality. Musculoskeletal: No chest wall mass or suspicious bone lesions identified. IMPRESSION: 1. Large left upper lobe lung mass is identified with internal areas of gas concerning for central necrosis. Findings are worrisome for primary bronchogenic carcinoma. Surrounding interlobular septal thickening is concerning for lymphangitic spread of tumor. Further evaluation with PET-CT and tissue sampling is advised. 2. Enlarged left paratracheal and subcarinal lymph nodes are identified. Cannot rule out metastatic adenopathy. 3. Scattered areas of tree-in-bud nodularity are identified throughout both lungs consistent with inflammatory or infectious bronchiolitis. 4. Peripheral areas of subpleural consolidation within the posterior and lateral right lower lobe are noted which may represent areas of atelectasis or pneumonia. 5. Aortic atherosclerosis. Coronary artery calcifications. Aortic Atherosclerosis (ICD10-I70.0). Electronically Signed   By: Kerby Moors M.D.   On: 11/17/2020 13:52   DG Chest Portable 1 View  Result Date: 11/17/2020 CLINICAL DATA:  Worsening cough, congestion, shortness of breath. Bloods tinged sputum. EXAM:  PORTABLE CHEST 1 VIEW COMPARISON:  February 14, 2018. FINDINGS: Rounded/masslike 8 cm opacity in the lateral left midlung. Mild diffuse interstitial prominence. No visible pleural effusions or pneumothorax. Similar cardiomediastinal silhouette. IMPRESSION: 1. Rounded/masslike 8 cm opacity in the lateral left midlung. Recommend chest CT to evaluate for possible mass. 2. Diffuse interstitial prominence, possibly chronic bronchitic related change given similar findings on the prior. The recommend chest CT can also better characterize this finding and evaluate for superimposed interstitial edema or atypical infection. Electronically Signed   By: Margaretha Sheffield MD   On: 11/17/2020 12:05   VAS US CAROTID  Result Date: 11/19/2020 Carotid  Arterial Duplex Study Patient Name:  Alex Edwards  Date of Exam:   11/19/2020 Medical Rec #: [id number]        Accession #:    [id number] Date of Birth: [birth year]         Patient Gender: M Patient Age:   85Y Exam Location:  [facility] Procedure:      VAS US CAROTID Referring Phys: [id number] [place] --------------------------------------------------------------------------------  Indications:       Stenosis. Risk Factors:      Hypertension, hyperlipidemia. Comparison Study:  No prior studies. Performing Technologist: Oliver Hum RVT  Examination Guidelines: A complete evaluation includes B-mode imaging, spectral Doppler, color Doppler, and power Doppler as needed of all accessible portions of each vessel. Bilateral testing is considered an integral part of a complete examination. Limited examinations for reoccurring indications may be performed as noted.  Right Carotid Findings: +----------+--------+--------+--------+--------------------------+--------+           PSV cm/sEDV cm/sStenosisPlaque Description        Comments +----------+--------+--------+--------+--------------------------+--------+ CCA Prox  99      20              smooth and heterogenous            +----------+--------+--------+--------+--------------------------+--------+ CCA Distal64      15              smooth and heterogenous            +----------+--------+--------+--------+--------------------------+--------+ ICA Prox  453     165     80-99%  irregular and heterogenous         +----------+--------+--------+--------+--------------------------+--------+ ICA Mid   267     91      60-79%                                     +----------+--------+--------+--------+--------------------------+--------+ ICA Distal130     54                                        tortuous +----------+--------+--------+--------+--------------------------+--------+ ECA       233     15                                                  +----------+--------+--------+--------+--------------------------+--------+ +----------+--------+-------+--------+-------------------+           PSV cm/sEDV cmsDescribeArm Pressure (mmHG) +----------+--------+-------+--------+-------------------+ Subclavian213                                        +----------+--------+-------+--------+-------------------+ +---------+--------+--+--------+--+---------+  VertebralPSV cm/s58EDV cm/s14Antegrade +---------+--------+--+--------+--+---------+  Left Carotid Findings: +----------+--------+--------+--------+-----------------------+--------+           PSV cm/sEDV cm/sStenosisPlaque Description     Comments +----------+--------+--------+--------+-----------------------+--------+ CCA Prox  129     26              smooth and heterogenous         +----------+--------+--------+--------+-----------------------+--------+ CCA Distal119     28              smooth and heterogenous         +----------+--------+--------+--------+-----------------------+--------+ ICA Prox  133     46      40-59%  smooth and heterogenous         +----------+--------+--------+--------+-----------------------+--------+ ICA Distal199     42                                     tortuous +----------+--------+--------+--------+-----------------------+--------+ ECA       193     12                                              +----------+--------+--------+--------+-----------------------+--------+ +----------+--------+--------+--------+-------------------+           PSV cm/sEDV cm/sDescribeArm Pressure (mmHG) +----------+--------+--------+--------+-------------------+ [id number]                                         +----------+--------+--------+--------+-------------------+ +---------+--------+--+--------+--+---------+ VertebralPSV cm/s59EDV cm/s15Antegrade +---------+--------+--+--------+--+---------+    Summary: Right Carotid: Velocities in the right ICA are consistent with a 80-99%                stenosis. Left Carotid: Velocities in the left ICA are consistent with a 40-59% stenosis. Vertebrals: Bilateral vertebral arteries demonstrate antegrade flow. *See table(s) above for measurements and observations.     Preliminary    DG C-ARM BRONCHOSCOPY  Result Date: 11/18/2020 C-ARM BRONCHOSCOPY: Fluoroscopy was utilized by the requesting physician.  No radiographic interpretation.    Labs:  CBC: Recent Labs    01/29/20 1157 11/17/20 1144 11/18/20 0443 11/19/20 0157  WBC 8.3 18.7* 17.8* 15.9*  HGB 13.8 12.8* 11.9* 12.5*  HCT 42.1 40.3 38.3* 40.5  PLT 286 352 343 337    COAGS: Recent Labs    11/17/20 1144  INR 1.1  APTT 35    BMP: Recent Labs    01/29/20 1157 11/17/20 1144 11/18/20 0443 11/19/20 0157  NA 141 136 138 139  K 5.0 4.5 4.6 5.1  CL 103 103 102 103  CO2 23 26 30 26   GLUCOSE 90 221* 135* 217*  BUN 12 24* 19 33*  CALCIUM 9.1 8.3* 8.3* 8.5*  CREATININE 0.97 1.34* 1.04 0.96  GFRNONAA 72 53* >60 >60  GFRAA 84  --   --   --     LIVER FUNCTION TESTS: Recent Labs    01/29/20 1157 11/17/20 1144  BILITOT 0.4 0.5  AST 32 25  ALT 30 18  ALKPHOS 70 70  PROT 6.6 7.3  ALBUMIN 4.2 3.2*    TUMOR MARKERS: No results for input(s): AFPTM, CEA, CA199, CHROMGRNA in the last 8760 hours.  Assessment and Plan: 85 y.o. male with history of diabetes, macular degeneration, dyslipidemia, hyperlipidemia, hypertension, obesity, psoriasis, tobacco abuse and recently  diagnosed left lung cancer/squamous cell carcinoma.  Patient also has history of a left neck mass which reportedly has been present for over 30 years, nontender.  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  Request now received from oncology for  image guided biopsy of this left neck mass.  Imaging studies have been reviewed by Dr.Shick. Risks and benefits of procedure was discussed with the patient  including, but not limited to bleeding, infection, damage to adjacent structures or low yield requiring additional tests.  All of the questions were answered and there is agreement to proceed.  Consent signed and in chart.  Procedure tentatively scheduled for 5/26   Thank you for this interesting consult.  I greatly enjoyed meeting Alex Edwards and look forward to participating in their care.  A copy of this report was sent to the requesting provider on this date.  Electronically Signed: D. Rowe Robert, PA-C 11/19/2020, 5:10 PM   I spent a total of 25 minutes    in face to face in clinical consultation, greater than 50% of which was counseling/coordinating care for image guided biopsy of left neck mass

## 2020-11-19 NOTE — Progress Notes (Signed)
PROGRESS NOTE    Alex Edwards  QMG:[id number] DOB: 09/09/1935 DOA: 11/17/2020 PCP: [name], MD   Brief Narrative:  85 year old male with past medical history of hypertension, hyperlipidemia,basal cell epithelioma,tobacco abuse, type 2 diabeteswho presented to the ED with gradually worsening shortness of breath on exertion and rest,productive cough, hemoptysisand fever for several days. he smokes 1 pack/day of cigarettes intermittently for years and quit last week when his breathing and coughing were worsening.  He met criteria for severe sepsis in the ED with tachycardic, tachypnea, hypoxia (SpO2 86% on room air) placed on4L/min in the setting of likely post-obstructive Pneumonia secondary to a newly discovered left upper lobe lung mass identified on CT chest in the ED.  Patient started on IV antibiotics for CAP and pulmonology consulted for bronchoscopy to obtain tissue diagnosis.  Assessment & Plan:   Active Problems:   Acute respiratory failure with hypoxia (HCC)   Severe sepsis (HCC)   CAP (community acquired pneumonia)   Lung mass   Conjunctivitis   Acute respiratory failure with hypoxia and and severe sepsis secondary to CAP, postobstructive pneumonia in setting of Left upper lobe lung mass with lymphadenopathy: Large mass concerning for lung malignancy.  Status post bronchoscopy, pathology still pending.  Pulmonary on board.  Oncology on board but have not seen the patient yet.  He still complains of having hemoptysis.  Denies any shortness of breath but requiring 4 L of oxygen.  Looks comfortable.  Continue Rocephin and Zithromax.   Left eye conjunctivitis: Resolved.  AKI: Resolved.  Hypertension: Controlled.  Continue to hold losartan and continue Lopressor.  Type 2 diabetes: Blood sugar controlled.  Continue SSI.  Hyperlipidemia -not on statin for some reason.  Will discuss with him about this.  Tobacco use disorder -1 PPD smoking history, quit last  week due to symptoms outlined above.  Obesity: Body mass index is 33.16 kg/m.  Complicates overall care and prognosis.    DVT prophylaxis: SCDs Start: 11/17/20 1454   Code Status: Full Code  Family Communication:  None present at bedside.  Plan of care discussed with patient in length and he verbalized understanding and agreed with it.  Status is: Inpatient  Remains inpatient appropriate because:Inpatient level of care appropriate due to severity of illness   Dispo: The patient is from: Home              Anticipated d/c is to: Home              Patient currently is not medically stable to d/c.   Difficult to place patient No        Estimated body mass index is 31.4 kg/m as calculated from the following:   Height as of this encounter: 6' 1"  (1.854 m).   Weight as of this encounter: 108 kg.      Nutritional status:               Consultants:   Oncology  PCCM  Procedures:   Bronchoscopy  Antimicrobials:  Anti-infectives (From admission, onward)   Start     Dose/Rate Route Frequency Ordered Stop   11/17/20 1145  cefTRIAXone (ROCEPHIN) 2 g in sodium chloride 0.9 % 100 mL IVPB        2 g 200 mL/hr over 30 Minutes Intravenous Every 24 hours 11/17/20 1130     11/17/20 1145  azithromycin (ZITHROMAX) 500 mg in sodium chloride 0.9 % 250 mL IVPB        500 mg  250 mL/hr over 60 Minutes Intravenous Every 24 hours 11/17/20 1130           Subjective: Seen and examined.  Breathing feels better but still has hemoptysis.  No other complaint.  Eager to go home.  He understands that he likely has large and aggressive lung cancer.  Objective: Vitals:   11/19/20 0045 11/19/20 0121 11/19/20 0542 11/19/20 1019  BP:   138/70 (!) 143/71  Pulse:  81 85 95  Resp: 19 20 20    Temp:   97.6 F (36.4 C)   TempSrc:   Oral   SpO2: (!) 77% 95% 98%   Weight:      Height:        Intake/Output Summary (Last 24 hours) at 11/19/2020 1120 Last data filed at 11/18/2020  2035 Gross per 24 hour  Intake 1198.33 ml  Output 475 ml  Net 723.33 ml   Filed Weights   11/17/20 1058 11/18/20 1222  Weight: 114 kg 108 kg    Examination:  General exam: Appears calm and comfortable, morbidly obese Respiratory system: Inspiratory as well as expiratory bilateral wheezes with rhonchi at the middle and lower lobes bilaterally. Respiratory effort normal. Cardiovascular system: S1 & S2 heard, RRR. No JVD, murmurs, rubs, gallops or clicks. No pedal edema. Gastrointestinal system: Abdomen is nondistended, soft and nontender. No organomegaly or masses felt. Normal bowel sounds heard. Central nervous system: Alert and oriented. No focal neurological deficits. Extremities: Symmetric 5 x 5 power. Skin: No rashes, lesions or ulcers Psychiatry: Judgement and insight appear normal. Mood & affect appropriate.    Data Reviewed: I have personally reviewed following labs and imaging studies  CBC: Recent Labs  Lab 11/17/20 1144 11/18/20 0443 11/19/20 0157  WBC 18.7* 17.8* 15.9*  NEUTROABS 16.1*  --   --   HGB 12.8* 11.9* 12.5*  HCT 40.3 38.3* 40.5  MCV 95.3 98.0 99.0  PLT 352 343 474   Basic Metabolic Panel: Recent Labs  Lab 11/17/20 1144 11/18/20 0443 11/19/20 0157  NA 136 138 139  K 4.5 4.6 5.1  CL 103 102 103  CO2 26 30 26   GLUCOSE 221* 135* 217*  BUN 24* 19 33*  CREATININE 1.34* 1.04 0.96  CALCIUM 8.3* 8.3* 8.5*   GFR: Estimated Creatinine Clearance: 75.1 mL/min (by C-G formula based on SCr of 0.96 mg/dL). Liver Function Tests: Recent Labs  Lab 11/17/20 1144  AST 25  ALT 18  ALKPHOS 70  BILITOT 0.5  PROT 7.3  ALBUMIN 3.2*   No results for input(s): LIPASE, AMYLASE in the last 168 hours. No results for input(s): AMMONIA in the last 168 hours. Coagulation Profile: Recent Labs  Lab 11/17/20 1144  INR 1.1   Cardiac Enzymes: No results for input(s): CKTOTAL, CKMB, CKMBINDEX, TROPONINI in the last 168 hours. BNP (last 3 results) No results  for input(s): PROBNP in the last 8760 hours. HbA1C: Recent Labs    11/18/20 0443  HGBA1C 6.4*   CBG: Recent Labs  Lab 11/17/20 2051 11/18/20 0743 11/18/20 1714 11/18/20 2025 11/19/20 0732  GLUCAP 175* 126* 118* 149* 143*   Lipid Profile: No results for input(s): CHOL, HDL, LDLCALC, TRIG, CHOLHDL, LDLDIRECT in the last 72 hours. Thyroid Function Tests: No results for input(s): TSH, T4TOTAL, FREET4, T3FREE, THYROIDAB in the last 72 hours. Anemia Panel: No results for input(s): VITAMINB12, FOLATE, FERRITIN, TIBC, IRON, RETICCTPCT in the last 72 hours. Sepsis Labs: Recent Labs  Lab 11/17/20 1144 11/17/20 1330  LATICACIDVEN 2.6* 2.0*  Recent Results (from the past 240 hour(s))  Resp Panel by RT-PCR (Flu A&B, Covid) Nasopharyngeal Swab     Status: None   Collection Time: 11/17/20 11:16 AM   Specimen: Nasopharyngeal Swab; Nasopharyngeal(NP) swabs in vial transport medium  Result Value Ref Range Status   SARS Coronavirus 2 by RT PCR NEGATIVE NEGATIVE Final    Comment: (NOTE) SARS-CoV-2 target nucleic acids are NOT DETECTED.  The SARS-CoV-2 RNA is generally detectable in upper respiratory specimens during the acute phase of infection. The lowest concentration of SARS-CoV-2 viral copies this assay can detect is 138 copies/mL. A negative result does not preclude SARS-Cov-2 infection and should not be used as the sole basis for treatment or other patient management decisions. A negative result may occur with  improper specimen collection/handling, submission of specimen other than nasopharyngeal swab, presence of viral mutation(s) within the areas targeted by this assay, and inadequate number of viral copies(<138 copies/mL). A negative result must be combined with clinical observations, patient history, and epidemiological information. The expected result is Negative.  Fact Sheet for Patients:  EntrepreneurPulse.com.au  Fact Sheet for Healthcare  Providers:  IncredibleEmployment.be  This test is no t yet approved or cleared by the [place] FDA and  has been authorized for detection and/or diagnosis of SARS-CoV-2 by FDA under an Emergency Use Authorization (EUA). This EUA will remain  in effect (meaning this test can be used) for the duration of the COVID-19 declaration under Section 564(b)(1) of the Act, 21 U.S.C.section 360bbb-3(b)(1), unless the authorization is terminated  or revoked sooner.       Influenza A by PCR NEGATIVE NEGATIVE Final   Influenza B by PCR NEGATIVE NEGATIVE Final    Comment: (NOTE) The Xpert Xpress SARS-CoV-2/FLU/RSV plus assay is intended as an aid in the diagnosis of influenza from Nasopharyngeal swab specimens and should not be used as a sole basis for treatment. Nasal washings and aspirates are unacceptable for Xpert Xpress SARS-CoV-2/FLU/RSV testing.  Fact Sheet for Patients: EntrepreneurPulse.com.au  Fact Sheet for Healthcare Providers: IncredibleEmployment.be  This test is not yet approved or cleared by the [place] FDA and has been authorized for detection and/or diagnosis of SARS-CoV-2 by FDA under an Emergency Use Authorization (EUA). This EUA will remain in effect (meaning this test can be used) for the duration of the COVID-19 declaration under Section 564(b)(1) of the Act, 21 U.S.C. section 360bbb-3(b)(1), unless the authorization is terminated or revoked.  Performed at [facility], [place] [address], [place], [place] [address]   Blood Culture (routine x 2)     Status: None (Preliminary result)   Collection Time: 11/17/20 11:30 AM   Specimen: BLOOD RIGHT ARM  Result Value Ref Range Status   Specimen Description   Final    BLOOD RIGHT ARM Performed at [facility] [address] [place], [place] [address]    Special Requests   Final    BOTTLES DRAWN AEROBIC AND ANAEROBIC Blood  Culture adequate volume Performed at [place] [facility] [address], [place], [place] [address]    Culture   Final    NO GROWTH 2 DAYS Performed at [place] [address] [place], [place] [address]    Report Status PENDING  Incomplete  Blood Culture (routine x 2)     Status: None (Preliminary result)   Collection Time: 11/17/20 11:35 AM   Specimen: BLOOD  Result Value Ref Range Status   Specimen Description   Final  BLOOD LEFT ANTECUBITAL Performed at [facility] [address] [place], [place] [address]    Special Requests   Final    BOTTLES DRAWN AEROBIC AND ANAEROBIC Blood Culture adequate volume Performed at [place] [address], [place], [place] [address]    Culture   Final    NO GROWTH 2 DAYS Performed at [place] [address] [place], [place] [address]    Report Status PENDING  Incomplete  Urine culture     Status: None   Collection Time: 11/17/20  4:45 PM   Specimen: In/Out Cath Urine  Result Value Ref Range Status   Specimen Description   Final    IN/OUT CATH URINE Performed at [facility] [address], [place], [place] [address]    Special Requests   Final    NONE Performed at [facility], [place] [address] [place], [place] [address]    Culture   Final    NO GROWTH Performed at [facility], [place] [address], [place], [place] [address]    Report Status 11/19/2020 FINAL  Final  MRSA PCR Screening     Status: None   Collection Time: 11/17/20  5:35 PM   Specimen: Nasopharyngeal  Result Value Ref Range Status   MRSA by PCR NEGATIVE NEGATIVE Final    Comment:        The GeneXpert MRSA Assay (FDA approved for NASAL specimens only), is one component of a comprehensive MRSA colonization surveillance program. It is not intended to diagnose MRSA infection nor to guide or monitor treatment for MRSA infections. Performed at  [facility], [place] [address], [place], [place] [address]   Culture, Respiratory w Gram Stain     Status: None (Preliminary result)   Collection Time: 11/18/20  3:15 PM   Specimen: Bronchial Alveolar Lavage; Respiratory  Result Value Ref Range Status   Specimen Description   Final    BRONCHIAL ALVEOLAR LAVAGE LUL Performed at [place] [address], [place], [place] [address]    Special Requests   Final    NONE Performed at [facility], [place] [address], [place], [place] [address]    Gram Stain   Final    ABUNDANT WBC PRESENT, PREDOMINANTLY PMN NO ORGANISMS SEEN    Culture   Final    NO GROWTH < 24 HOURS Performed at [facility] [address], [place],  [address]    Report Status PENDING  Incomplete      Radiology Studies: CT Chest Wo Contrast  Result Date: 11/17/2020 CLINICAL DATA:  Short of breath. EXAM: CT CHEST WITHOUT CONTRAST TECHNIQUE: Multidetector CT imaging of the chest was performed following the standard protocol without IV contrast. COMPARISON:  01/03/2014 FINDINGS: Cardiovascular: Heart size is normal. Aortic atherosclerosis. Coronary artery calcifications. No pericardial effusion. Mediastinum/Nodes: Normal appearance of the thyroid gland. The trachea appears patent and is midline. Normal appearance of the esophagus. No axillary or supraclavicular adenopathy. Left paratracheal lymph node measures 1.4 cm, image 59/2. Subcarinal node measures 1.3 cm, image 73/2. The hilar lymph nodes are suboptimally evaluated due to lack of IV contrast material. Lungs/Pleura: Within the left upper lobe there is a large mass measuring 7.3 cm in maximum dimension, image 65/5 and image 90/6. Internal areas of gas within this mass are noted concerning for central necrosis. Surrounding interlobular septal thickening is noted within the left upper lobe concerning for  lymphangitic spread of tumor. Scattered areas of  tree-in-bud nodularity are identified throughout both lungs most notable within the lower lung zones. Peripheral areas of subpleural consolidation noted within the posterior and lateral right lower lobe. Upper Abdomen: No acute abnormality within the imaged portions of the upper abdomen. Nodule in the left adrenal gland measures 1.9 cm, image 157/2. Unchanged from 2015 compatible with a benign abnormality. Musculoskeletal: No chest wall mass or suspicious bone lesions identified. IMPRESSION: 1. Large left upper lobe lung mass is identified with internal areas of gas concerning for central necrosis. Findings are worrisome for primary bronchogenic carcinoma. Surrounding interlobular septal thickening is concerning for lymphangitic spread of tumor. Further evaluation with PET-CT and tissue sampling is advised. 2. Enlarged left paratracheal and subcarinal lymph nodes are identified. Cannot rule out metastatic adenopathy. 3. Scattered areas of tree-in-bud nodularity are identified throughout both lungs consistent with inflammatory or infectious bronchiolitis. 4. Peripheral areas of subpleural consolidation within the posterior and lateral right lower lobe are noted which may represent areas of atelectasis or pneumonia. 5. Aortic atherosclerosis. Coronary artery calcifications. Aortic Atherosclerosis (ICD10-I70.0). Electronically Signed   By: Kerby Moors M.D.   On: 11/17/2020 13:52   DG Chest Portable 1 View  Result Date: 11/17/2020 CLINICAL DATA:  Worsening cough, congestion, shortness of breath. Bloods tinged sputum. EXAM: PORTABLE CHEST 1 VIEW COMPARISON:  February 14, 2018. FINDINGS: Rounded/masslike 8 cm opacity in the lateral left midlung. Mild diffuse interstitial prominence. No visible pleural effusions or pneumothorax. Similar cardiomediastinal silhouette. IMPRESSION: 1. Rounded/masslike 8 cm opacity in the lateral left midlung. Recommend chest CT to evaluate for possible mass. 2. Diffuse interstitial  prominence, possibly chronic bronchitic related change given similar findings on the prior. The recommend chest CT can also better characterize this finding and evaluate for superimposed interstitial edema or atypical infection. Electronically Signed   By: Margaretha Sheffield MD   On: 11/17/2020 12:05   DG C-ARM BRONCHOSCOPY  Result Date: 11/18/2020 C-ARM BRONCHOSCOPY: Fluoroscopy was utilized by the requesting physician.  No radiographic interpretation.    Scheduled Meds: . insulin aspart  0-9 Units Subcutaneous TID WC  . metoprolol tartrate  50 mg Oral BID  . ofloxacin  1 drop Both Eyes QID  . polyvinyl alcohol  1 drop Left Eye TID  . sodium chloride flush  3 mL Intravenous Q12H   Continuous Infusions: . azithromycin Stopped (11/18/20 1900)  . cefTRIAXone (ROCEPHIN)  IV Stopped (11/18/20 1900)     LOS: 2 days   Time spent: 36 minutes   Darliss Cheney, MD [facility]  11/19/2020, 11:20 AM   How to contact the [facility] Attending or Consulting provider [place] or covering provider during after hours [place], for this patient?  1. Check the care team in [facility] and look for a) attending/consulting [facility] provider listed and b) the [facility] team listed. Page or secure chat [phone]. 2. Log into [email or web address] and use [place]'s universal password to access. If you do not have the password, please contact the hospital operator. 3. Locate the [facility] provider you are looking for under [facility] and page to a number that you can be directly reached. 4. If you still have difficulty reaching the provider, please page the [place] (Director on Call) for the Hospitalists listed on amion for assistance.

## 2020-11-19 NOTE — Consult Note (Signed)
[place] [phone]   [phone]  [place] INPATIENT CONSULT NOTE  REFERRING PHYSICIAN: Dr. Darliss Cheney  REASON FOR CONSULTATION:  85 years old white male recently diagnosed with lung cancer.  HPI Alex Edwards is a 85 y.o. male with past medical history significant for multiple medical problems including history of hypertension, dyslipidemia, diabetes mellitus, psoriasis, degenerative macular degeneration, basal cell epithelioma as well as seasonal allergy.  The patient mentioned that he has been doing fine except for cough started several days ago with productive sputum and small amount of hemoptysis.  He also had fever for several days.  He had lack of appetite and presented to the emergency department for evaluation.  On Nov 17, 2020 he had chest x-ray followed by CT scan of the chest without contrast that showed a large mass within the left upper lobe measuring 7.3 cm with internal areas of gas within the mass concerning for central necrosis.  There was surrounding interlobular segmental thickening concerning for lymphangitic spread of tumor.  There was also enlarged left paratracheal and subcarinal lymph nodes identified and scattered areas of tree-in-bud nodularity throughout both lungs consistent with inflammatory or infectious bronchiolitis.  The patient underwent video bronchoscopy under the care of Dr. Shearon Stalls on 11/18/2020.  The final pathology ([id number]) of the left upper lobe brushings showed malignant cells consistent with non-small cell carcinoma and the cytologic features favor squamous cell carcinoma.  There was insufficient tumor cellularity for immunohistochemistry. The patient also had CT scan of the neck on 11/19/2020 for evaluation of left jaw lump that he had for over 30 years and that showed 6.1 x 2.9 cm ovioid soft tissue focus within the left upper neck along the inferior aspect of the left parotid gland favored primary parotid  neoplasm versus nodal metastatic disease.  No other pathologically enlarged with lymph nodes identified in the neck. I was asked to see the patient today for evaluation and recommendation regarding treatment of his condition. When seen today he is feeling fine but continues to have mild cough and shortness of breath and he is on oxygen nasal cannula and continues to have mild hemoptysis.  He denied having any fever or chills.  He has no nausea, vomiting, diarrhea or constipation.  He has no headache or visual changes.   Family history significant for father died from brain aneurysm and mother died after complication from a fall. The patient is married and has 3 children.  He used to work in [employer].  He has a history of smoking for around 65 years.  He does not drink alcohol and no history of drug abuse. HPI  Past Medical History:  Diagnosis Date  . BCE (basal cell epithelioma)   . Bruises easily    on hands  . Cough    smokers cough or perfurmes  . Degeneration macular    right eye  . Diabetes mellitus without complication ([place])   . Dyslipidemia   . ED (erectile dysfunction)   . Hx of adenomatous colonic polyps   . Hyperlipidemia   . Hypertension    borderline  . Obesity   . Psoriasis   . Seasonal allergies   . Smoker    former    Past Surgical History:  Procedure Laterality Date  . CATARACT EXTRACTION, BILATERAL Bilateral 2013  . COLONOSCOPY  2005   [facility]  . POLYPECTOMY    . SKIN CANCER EXCISION Right 1993   under eye-MOHS, freeze multiple  places freq  . TONSILLECTOMY  as child  . TOTAL HIP ARTHROPLASTY Left 01/04/2014   Procedure: LEFT TOTAL HIP ARTHROPLASTY ANTERIOR APPROACH;  Surgeon: Mcarthur Rossetti, MD;  Location: [facility];  Service: Orthopedics;  Laterality: Left;  . TOTAL HIP ARTHROPLASTY Left 02/14/2014   Procedure: Irrigation and Debridement left hip;  Surgeon: Mcarthur Rossetti, MD;  Location: [facility];  Service: Orthopedics;  Laterality: Left;     Family History  Problem Relation Age of Onset  . Heart disease Brother     Social History Social History   Tobacco Use  . Smoking status: Current Some Day Smoker    Packs/day: 1.00    Years: 50.00    Pack years: 50.00    Types: Cigarettes    Last attempt to quit: 04/28/2014    Years since quitting: 6.5  . Smokeless tobacco: Never Used  Substance Use Topics  . Alcohol use: No    Alcohol/week: 0.0 standard drinks  . Drug use: No    Allergies  Allergen Reactions  . Vancomycin     Red Man Syndrome   . Latex Other (See Comments)    "possibly allergic- dry, itching, rash"    Current Facility-Administered Medications  Medication Dose Route Frequency Provider Last Rate Last Admin  . acetaminophen (TYLENOL) tablet 650 mg  650 mg Oral Q6H PRN Spero Geralds, MD   650 mg at 11/18/20 2246   Or  . acetaminophen (TYLENOL) suppository 650 mg  650 mg Rectal Q6H PRN Spero Geralds, MD      . albuterol (PROVENTIL) (2.5 MG/3ML) 0.083% nebulizer solution 2.5 mg  2.5 mg Nebulization Q2H PRN Spero Geralds, MD      . azithromycin (ZITHROMAX) 500 mg in sodium chloride 0.9 % 250 mL IVPB  500 mg Intravenous Q24H Spero Geralds, MD   Stopped at 11/18/20 1900  . cefTRIAXone (ROCEPHIN) 2 g in sodium chloride 0.9 % 100 mL IVPB  2 g Intravenous Q24H Spero Geralds, MD 200 mL/hr at 11/19/20 1218 2 g at 11/19/20 1218  . insulin aspart (novoLOG) injection 0-9 Units  0-9 Units Subcutaneous TID WC Spero Geralds, MD   2 Units at 11/19/20 1214  . ketoconazole (NIZORAL) 2 % cream   Topical Daily PRN Spero Geralds, MD      . labetalol (NORMODYNE) injection 10 mg  10 mg Intravenous Q4H PRN Nicole Kindred A, DO      . metoprolol tartrate (LOPRESSOR) tablet 50 mg  50 mg Oral BID Spero Geralds, MD   50 mg at 11/19/20 1019  . ofloxacin (OCUFLOX) 0.3 % ophthalmic solution 1 drop  1 drop Both Eyes QID Spero Geralds, MD   1 drop at 11/19/20 1313  . polyvinyl alcohol (LIQUIFILM TEARS) 1.4 %  ophthalmic solution 1 drop  1 drop Left Eye TID Spero Geralds, MD   1 drop at 11/19/20 1018  . sodium chloride flush (NS) 0.9 % injection 3 mL  3 mL Intravenous Q12H Spero Geralds, MD   3 mL at 11/19/20 1020    Review of Systems  Constitutional: positive for fatigue and weight loss Eyes: negative Ears, nose, mouth, throat, and face: negative Respiratory: positive for cough, dyspnea on exertion, hemoptysis and wheezing Cardiovascular: negative Gastrointestinal: negative Genitourinary:negative Integument/breast: negative Hematologic/lymphatic: negative Musculoskeletal:negative Neurological: negative Behavioral/Psych: negative Endocrine: negative Allergic/Immunologic: negative  Physical Exam  QQP:YPPJK, healthy, no distress, well nourished and well developed SKIN: skin color, texture, turgor are normal, no  rashes or significant lesions HEAD: Normocephalic, No masses, lesions, tenderness or abnormalities EYES: normal, PERRLA, Conjunctiva are pink and non-injected EARS: External ears normal, Canals clear OROPHARYNX:no exudate, no erythema and lips, buccal mucosa, and tongue normal  NECK: supple, no adenopathy, no JVD LYMPH:  no palpable lymphadenopathy, no hepatosplenomegaly, a palpable lump in the parotid gland area on the left side LUNGS: coarse sounds heard, expiratory wheezes bilaterally HEART: regular rate & rhythm, no murmurs and no gallops ABDOMEN:abdomen soft, non-tender, obese, normal bowel sounds and no masses or organomegaly BACK: No CVA tenderness, Range of motion is normal EXTREMITIES:no joint deformities, effusion, or inflammation, no edema  NEURO: alert & oriented x 3 with fluent speech, no focal motor/sensory deficits  PERFORMANCE STATUS: ECOG 1  LABORATORY DATA: Lab Results  Component Value Date   WBC 15.9 (H) 11/19/2020   HGB 12.5 (L) 11/19/2020   HCT 40.5 11/19/2020   MCV 99.0 11/19/2020   PLT 337 11/19/2020    @LASTCHEM @  RADIOGRAPHIC  STUDIES: CT SOFT TISSUE NECK WO CONTRAST  Result Date: 11/19/2020 CLINICAL DATA:  Parotid region mass. Additional history provided: Patient reports lump under left jaw for 30+ years. EXAM: CT NECK WITHOUT CONTRAST TECHNIQUE: Multidetector CT imaging of the neck was performed following the standard protocol without intravenous contrast. COMPARISON:  Chest CT 11/17/2020. FINDINGS: Mildly motion degraded exam. Pharynx and larynx: No appreciable swelling or discrete mass within the oral cavity, pharynx or larynx on this noncontrast examination. Salivary glands: 6.1 x 2.9 cm ovoid slightly heterogeneous soft tissue focus along the inferior aspect of the left parotid gland, extending to the left level 2 station and overlying the anterior aspect of the left sternocleidomastoid muscle. The right parotid and bilateral submandibular glands are unremarkable. Thyroid: Unremarkable. Lymph nodes: Please refer to salivary gland findings above. Elsewhere within the neck, no pathologically enlarged cervical chain lymph nodes are identified. Vascular: There is limited assessment of the major vascular structures of the neck in the absence of intravenous contrast. Calcified atherosclerotic plaque within the visualized aortic arch, proximal major branch vessels of the neck and carotid arteries. Calcified plaque within the proximal right ICA could result in a hemodynamically significant stenosis. Limited intracranial: No acute intracranial abnormality identified within the field of view. Visualized orbits: Incompletely imaged. No mass or acute finding at the imaged levels. Mastoids and visualized paranasal sinuses: No significant paranasal sinus disease or mastoid effusion at the imaged levels. Skeleton: Reversal of the expected cervical lordosis. Mild C6-C7 and C7-T1 grade 1 anterolisthesis. Cervical spondylosis. No acute bony abnormality or aggressive osseous lesion identified. Upper chest: Incompletely imaged known large left upper  lobe lung mass. IMPRESSION: 6.1 x 2.9 cm ovoid soft tissue focus within the left upper neck, along the inferior aspect of the left parotid gland. Favored differential considerations are primary parotid neoplasm versus nodal metastatic disease. Direct tissue sampling should be considered. Elsewhere within the neck, no pathologically enlarged lymph nodes are identified. Partially imaged known left upper lobe pulmonary mass suspicious for bronchogenic carcinoma. Atherosclerotic disease as described. Notably, calcified plaque at the origin of the right ICA could result in a hemodynamically significant stenosis. Carotid artery duplex recommended for further evaluation. Cervical spondylosis. Electronically Signed   By: Kellie Simmering DO   On: 11/19/2020 13:25   CT Chest Wo Contrast  Result Date: 11/17/2020 CLINICAL DATA:  Short of breath. EXAM: CT CHEST WITHOUT CONTRAST TECHNIQUE: Multidetector CT imaging of the chest was performed following the standard protocol without IV contrast. COMPARISON:  01/03/2014 FINDINGS:  Cardiovascular: Heart size is normal. Aortic atherosclerosis. Coronary artery calcifications. No pericardial effusion. Mediastinum/Nodes: Normal appearance of the thyroid gland. The trachea appears patent and is midline. Normal appearance of the esophagus. No axillary or supraclavicular adenopathy. Left paratracheal lymph node measures 1.4 cm, image 59/2. Subcarinal node measures 1.3 cm, image 73/2. The hilar lymph nodes are suboptimally evaluated due to lack of IV contrast material. Lungs/Pleura: Within the left upper lobe there is a large mass measuring 7.3 cm in maximum dimension, image 65/5 and image 90/6. Internal areas of gas within this mass are noted concerning for central necrosis. Surrounding interlobular septal thickening is noted within the left upper lobe concerning for lymphangitic spread of tumor. Scattered areas of tree-in-bud nodularity are identified throughout both lungs most notable  within the lower lung zones. Peripheral areas of subpleural consolidation noted within the posterior and lateral right lower lobe. Upper Abdomen: No acute abnormality within the imaged portions of the upper abdomen. Nodule in the left adrenal gland measures 1.9 cm, image 157/2. Unchanged from 2015 compatible with a benign abnormality. Musculoskeletal: No chest wall mass or suspicious bone lesions identified. IMPRESSION: 1. Large left upper lobe lung mass is identified with internal areas of gas concerning for central necrosis. Findings are worrisome for primary bronchogenic carcinoma. Surrounding interlobular septal thickening is concerning for lymphangitic spread of tumor. Further evaluation with PET-CT and tissue sampling is advised. 2. Enlarged left paratracheal and subcarinal lymph nodes are identified. Cannot rule out metastatic adenopathy. 3. Scattered areas of tree-in-bud nodularity are identified throughout both lungs consistent with inflammatory or infectious bronchiolitis. 4. Peripheral areas of subpleural consolidation within the posterior and lateral right lower lobe are noted which may represent areas of atelectasis or pneumonia. 5. Aortic atherosclerosis. Coronary artery calcifications. Aortic Atherosclerosis (ICD10-I70.0). Electronically Signed   By: Kerby Moors M.D.   On: 11/17/2020 13:52   DG Chest Portable 1 View  Result Date: 11/17/2020 CLINICAL DATA:  Worsening cough, congestion, shortness of breath. Bloods tinged sputum. EXAM: PORTABLE CHEST 1 VIEW COMPARISON:  February 14, 2018. FINDINGS: Rounded/masslike 8 cm opacity in the lateral left midlung. Mild diffuse interstitial prominence. No visible pleural effusions or pneumothorax. Similar cardiomediastinal silhouette. IMPRESSION: 1. Rounded/masslike 8 cm opacity in the lateral left midlung. Recommend chest CT to evaluate for possible mass. 2. Diffuse interstitial prominence, possibly chronic bronchitic related change given similar findings  on the prior. The recommend chest CT can also better characterize this finding and evaluate for superimposed interstitial edema or atypical infection. Electronically Signed   By: Margaretha Sheffield MD   On: 11/17/2020 12:05   DG C-ARM BRONCHOSCOPY  Result Date: 11/18/2020 C-ARM BRONCHOSCOPY: Fluoroscopy was utilized by the requesting physician.  No radiographic interpretation.    ASSESSMENT: This is a very pleasant 85 years old white male recently diagnosed with at least stage IIIb (T3, N2, M0) non-small cell lung cancer, squamous cell carcinoma presented with large left upper lobe lung mass in addition to left hilar and mediastinal lymphadenopathy diagnosed in May 2022.   PLAN: I had a lengthy discussion with the patient today about his current disease stage, prognosis and treatment options. I personally and independently reviewed the scan images and discussed the result with the patient. I recommended for the patient to complete the staging work-up by ordering a PET scan on outpatient basis.  We will order MRI of the brain to rule out brain metastasis. I agree with the ultrasound fine-needle aspiration of the suspicious parotid gland mass.  The patient has this  for over 30 years and I doubt it will be metastatic from the lung cancer. I will consult radiation oncology for evaluation of his condition especially with the hemoptysis. I discussed with the patient his treatment options and I recommended for him a course of concurrent chemoradiation with weekly carboplatin for AUC of 2 and paclitaxel 45 Mg/M2 for 6-7 weeks followed by consolidation immunotherapy if the patient has no evidence for disease progression after the induction phase. I will arrange for the patient to have a follow-up appointment with me after discharge from the hospital for more detailed discussion of this treatment options and to schedule it on outpatient basis. I strongly recommend for the patient to continue with the smoking  cessation. He was advised to call immediately if he has any other concerning symptoms in the interval.  The patient voices understanding of current disease status and treatment options and is in agreement with the current care plan.  All questions were answered. The patient knows to call the clinic with any problems, questions or concerns. We can certainly see the patient much sooner if necessary.  Thank you so much for allowing me to participate in the care of Alex Edwards. I will continue to follow up the patient with you and assist in his care.   Disclaimer: This note was dictated with voice recognition software. Similar sounding words can inadvertently be transcribed and may not be corrected upon review.   Eilleen Kempf Nov 19, 2020, 4:07 PM

## 2020-11-19 NOTE — Evaluation (Signed)
Physical Therapy Evaluation Patient Details Name: Alex Edwards MRN: [id number] DOB: March 20, 1936 Today's Date: 11/19/2020   History of Present Illness  85 yo male admitted with acute respiratory failure, hemoptysis, conjunctivitis, new lung mass on imaging. Hx of L THA 2015, obesity  Clinical Impression  On eval, pt required Min A for mobility. He walked ~135 feet with a RW. O2 87% on 2L [place], dyspnea 2/4 during session. Pt reported fatigue and dyspnea during session. He was happy to be OOB. He is eager to d/c home. Will continue to follow and progress activity as tolerated.     Follow Up Recommendations Home health PT;Supervision for mobility/OOB    Equipment Recommendations  None recommended by PT    Recommendations for Other Services       Precautions / Restrictions Precautions Precautions: Fall Precaution Comments: monitor O2 Restrictions Weight Bearing Restrictions: No      Mobility  Bed Mobility Overal bed mobility: Needs Assistance Bed Mobility: Supine to Sit     Supine to sit: Supervision;HOB elevated     General bed mobility comments: supv for safety, lines. some lightheadedness reported. O2 dropped to 88% on RA so replaced [place] for OOB activity    Transfers Overall transfer level: Needs assistance Equipment used: Rolling walker (2 wheeled) Transfers: Sit to/from Stand Sit to Stand: Min guard;From elevated surface         General transfer comment: Min guard for safety. Cues for safety, hand placement. Increased time. Unsteady.  Ambulation/Gait Ambulation/Gait assistance: Min assist Gait Distance (Feet): 135 Feet Assistive device: Rolling walker (2 wheeled) Gait Pattern/deviations: Step-through pattern;Decreased stride length     General Gait Details: Guarded gait even with RW use. LOB x 1 during 1st 10 feet. Improved steadiness as distance increased. Dyspnea 2/4. O2 87% on 2L [place].  Stairs            Wheelchair Mobility    Modified Rankin  (Stroke Patients Only)       Balance Overall balance assessment: Needs assistance         Standing balance support: Bilateral upper extremity supported Standing balance-Leahy Scale: Poor                               Pertinent Vitals/Pain Pain Assessment: No/denies pain    Home Living Family/patient expects to be discharged to:: Private residence Living Arrangements: Spouse/significant other Available Help at Discharge: Family Type of Home: House Home Access: Stairs to enter Entrance Stairs-Rails: None Entrance Stairs-Number of Steps: 2 Home Layout: One level;Laundry or work area in [place]: Environmental consultant - 2 wheels;Cane - single point      Prior Function Level of Independence: Independent               Hand Dominance        Extremity/Trunk Assessment   Upper Extremity Assessment Upper Extremity Assessment: Defer to OT evaluation    Lower Extremity Assessment Lower Extremity Assessment: Generalized weakness (bil LE neuropathy)    Cervical / Trunk Assessment Cervical / Trunk Assessment: Normal  Communication   Communication: No difficulties  Cognition Arousal/Alertness: Awake/alert Behavior During Therapy: WFL for tasks assessed/performed Overall Cognitive Status: Within Functional Limits for tasks assessed                                        General Comments  Exercises     Assessment/Plan    PT Assessment Patient needs continued PT services  PT Problem List Decreased strength;Decreased mobility;Decreased activity tolerance;Decreased balance;Decreased knowledge of use of DME;Impaired sensation       PT Treatment Interventions DME instruction;Gait training;Therapeutic exercise;Balance training;Functional mobility training;Therapeutic activities;Patient/family education    PT Goals (Current goals can be found in the Care Plan section)  Acute Rehab PT Goals Patient Stated Goal: home soon! PT Goal  Formulation: With patient Time For Goal Achievement: 12/03/20 Potential to Achieve Goals: Good    Frequency Min 3X/week   Barriers to discharge        Co-evaluation               AM-PAC PT "6 Clicks" Mobility  Outcome Measure Help needed turning from your back to your side while in a flat bed without using bedrails?: None Help needed moving from lying on your back to sitting on the side of a flat bed without using bedrails?: None Help needed moving to and from a bed to a chair (including a wheelchair)?: A Little Help needed standing up from a chair using your arms (e.g., wheelchair or bedside chair)?: A Little Help needed to walk in hospital room?: A Little Help needed climbing 3-5 steps with a railing? : A Little 6 Click Score: 20    End of Session Equipment Utilized During Treatment: Gait belt;Oxygen Activity Tolerance: Patient tolerated treatment well Patient left: in chair;with call bell/phone within reach;with chair alarm set   PT Visit Diagnosis: Difficulty in walking, not elsewhere classified (R26.2);Muscle weakness (generalized) (M62.81);Unsteadiness on feet (R26.81)    Time: 2426-8341 PT Time Calculation (min) (ACUTE ONLY): 23 min   Charges:   PT Evaluation $PT Eval Moderate Complexity: 1 Mod PT Treatments $Gait Training: 8-22 mins          Doreatha Massed, PT Acute Rehabilitation  Office: [phone] Pager: [phone]

## 2020-11-19 NOTE — Progress Notes (Signed)
Report received from [place]. No changes noted at this time with Pt's assessment Maintain current plan of care

## 2020-11-19 NOTE — Progress Notes (Signed)
Pt declined use of nocturnal cpap tonight.  Pt remains on 4lnc.  Machine in room on standby.  RN will advise this writer if pt changes his mind or if any desaturation or apneic episodes occur.  RT will assist as needed.

## 2020-11-19 NOTE — Progress Notes (Signed)
Carotid artery duplex has been completed. Preliminary results can be found in CV Proc through chart review.   11/19/20 4:27 PM Alex Edwards RVT

## 2020-11-19 NOTE — Addendum Note (Signed)
Addended by: Amado Coe on: 11/19/2020 04:18 PM   Modules accepted: Orders

## 2020-11-19 NOTE — Progress Notes (Signed)
NAME:  Alex Edwards, MRN:  [id number], DOB:  [birth year], LOS: 2 ADMISSION DATE:  11/17/2020, CONSULTATION DATE:  11/19/2020 REFERRING MD:  Darliss Cheney, MD, CHIEF COMPLAINT:  Lung mass  History of Present Illness:  The patient is an 85 year old gentleman with a history of tobacco use disorder who presents from home with 4 days of worsening shortness of breath, sputum production, and hemoptysis.  He notes that for the past 4 days he has had decreased appetite.  He denies any fevers chills night sweats or weight loss.  He has smoked no more than a pack a day for the last 60 years on and off.  He quit last week when his breathing and coughing got worse.  He does have a daily cough with mild sputum production but has been worse in the last few days.  He does not have a formal diagnosis of COPD.  He came to the ED with the symptoms and had a CT chest which demonstrated left upper lobe mass concerning for primary lung malignancy.  Pulmonary is being consulted to assist with potential bronchoscopy with biopsy of this mass.  Pertinent  Medical History  Hypertension Hyperlipidemia  Significant Hospital Events: Including procedures, antibiotic start and stop dates in addition to other pertinent events   . 5/23 presented to ED admitted to hospital medicine  Interim History / Subjective:  Had bronch yesterday. Still coughing up blood. Wife at bedside.  Objective   Blood pressure 128/65, pulse 87, temperature 97.8 F (36.6 C), temperature source Oral, resp. rate 20, height _0  (1.854 m), weight 108 kg, SpO2 99 %.        Intake/Output Summary (Last 24 hours) at 11/19/2020 1425 Last data filed at 11/19/2020 1300 Gross per 24 hour  Intake 1558.33 ml  Output 975 ml  Net 583.33 ml   Filed Weights   11/17/20 1058 11/18/20 1222  Weight: 114 kg 108 kg    Examination: General: Elderly, no respiratory distress, sitting up in chair HENT: left mandibular swelling, mobile, firm Lungs: Diminished  bilaterally, no wheezes or crackles Cardiovascular: Regular rate and rhythm, no murmurs rubs or gallops Abdomen: Obese, soft, non tender, normal bowel sounds Extremities: No edema Neuro: Normal speech, no focal asymmetry   Labs/imaging that I havepersonally reviewed  (right click and "Reselect all SmartList Selections" daily)  Micro shows abundant PMNs and no organisms from sputum cx Cytology negative from BAL - brushing show NSCLC    Assessment & Plan:   NSCLC - new diagnosis Acute hypoxemic respiratory failure on 4LNC CAP Hemoptysis - expect this will decrease with time.  Tobacco use disorder  Bronchial brushings show NSCLC. 4L lymph node negative for malignancy, 7 lymph node without lymphocytes based on path report. May need additional tissue for genetic testing. Prefer to get PET scan first to plan next procedure. I have ordered this. Will contact him with next steps. Will refer for oncology for follow up.  Micro shows abundant PMS, no WBC. Reasonable for treating for CAP coverage - he had copious sputum in his bronchoscopy. Wean oxygen goal sats over 88%. May need oxygen at discharge.   Lenice Llamas, MD [facility]   Labs   CBC: Recent Labs  Lab 11/17/20 1144 11/18/20 0443 11/19/20 0157  WBC 18.7* 17.8* 15.9*  NEUTROABS 16.1*  --   --   HGB 12.8* 11.9* 12.5*  HCT 40.3 38.3* 40.5  MCV 95.3 98.0 99.0  PLT 352 343 337  Basic Metabolic Panel: Recent Labs  Lab 11/17/20 1144 11/18/20 0443 11/19/20 0157  NA 136 138 139  K 4.5 4.6 5.1  CL 103 102 103  CO2 _0 GLUCOSE 221* 135* 217*  BUN 24* 19 33*  CREATININE 1.34* 1.04 0.96  CALCIUM 8.3* 8.3* 8.5*   GFR: Estimated Creatinine Clearance: 75.1 mL/min (by C-G formula based on SCr of 0.96 mg/dL). Recent Labs  Lab 11/17/20 1144 11/17/20 1330 11/18/20 0443 11/19/20 0157  WBC 18.7*  --  17.8* 15.9*  LATICACIDVEN 2.6* 2.0*  --   --     Liver Function  Tests: Recent Labs  Lab 11/17/20 1144  AST 25  ALT 18  ALKPHOS 70  BILITOT 0.5  PROT 7.3  ALBUMIN 3.2*   No results for input(s): LIPASE, AMYLASE in the last 168 hours. No results for input(s): AMMONIA in the last 168 hours.  ABG    Component Value Date/Time   HCO3 26.9 11/19/2020 0157   O2SAT 92.8 11/19/2020 0157     Coagulation Profile: Recent Labs  Lab 11/17/20 1144  INR 1.1

## 2020-11-19 NOTE — Addendum Note (Signed)
Addended by: Lenice Llamas on: 11/19/2020 02:17 PM   Modules accepted: Orders

## 2020-11-20 ENCOUNTER — Inpatient Hospital Stay (HOSPITAL_COMMUNITY): Payer: Medicare Other

## 2020-11-20 ENCOUNTER — Ambulatory Visit
Admit: 2020-11-20 | Discharge: 2020-11-20 | Disposition: A | Discharge status: Home / Self Care | Payer: Medicare Other | Attending: Radiation Oncology | Admitting: Radiation Oncology

## 2020-11-20 DIAGNOSIS — C3492 Malignant neoplasm of unspecified part of left bronchus or lung: Secondary | ICD-10-CM | POA: Insufficient documentation

## 2020-11-20 DIAGNOSIS — I6521 Occlusion and stenosis of right carotid artery: Secondary | ICD-10-CM

## 2020-11-20 DIAGNOSIS — J9601 Acute respiratory failure with hypoxia: Secondary | ICD-10-CM | POA: Diagnosis not present

## 2020-11-20 DIAGNOSIS — C3490 Malignant neoplasm of unspecified part of unspecified bronchus or lung: Secondary | ICD-10-CM

## 2020-11-20 LAB — GLUCOSE, CAPILLARY
Glucose-Capillary: 108 mg/dL — ABNORMAL HIGH (ref 70–99)
Glucose-Capillary: 120 mg/dL — ABNORMAL HIGH (ref 70–99)
Glucose-Capillary: 182 mg/dL — ABNORMAL HIGH (ref 70–99)
Glucose-Capillary: 95 mg/dL (ref 70–99)

## 2020-11-20 LAB — BASIC METABOLIC PANEL
Anion gap: 7 (ref 5–15)
BUN: 29 mg/dL — ABNORMAL HIGH (ref 8–23)
CO2: 31 mmol/L (ref 22–32)
Calcium: 8.6 mg/dL — ABNORMAL LOW (ref 8.9–10.3)
Chloride: 104 mmol/L (ref 98–111)
Creatinine, Ser: 1.02 mg/dL (ref 0.61–1.24)
GFR, Estimated: >60 mL/min (ref >60)
Glucose, Bld: 110 mg/dL — ABNORMAL HIGH (ref 70–99)
Potassium: 6.1 mmol/L — ABNORMAL HIGH (ref 3.5–5.1)
Sodium: 142 mmol/L (ref 135–145)

## 2020-11-20 LAB — CBC
HCT: 41.2 % (ref 39.0–52.0)
Hemoglobin: 12.7 g/dL — ABNORMAL LOW (ref 13.0–17.0)
MCH: 30.5 pg (ref 26.0–34.0)
MCHC: 30.8 g/dL (ref 30.0–36.0)
MCV: 99 fL (ref 80.0–100.0)
Platelets: 417 10*3/uL — ABNORMAL HIGH (ref 150–400)
RBC: 4.16 MIL/uL — ABNORMAL LOW (ref 4.22–5.81)
RDW: 13.1 % (ref 11.5–15.5)
WBC: 18.4 10*3/uL — ABNORMAL HIGH (ref 4.0–10.5)
nRBC: 0 % (ref 0.0–0.2)

## 2020-11-20 LAB — POTASSIUM: Potassium: 4.3 mmol/L (ref 3.5–5.1)

## 2020-11-20 MED ORDER — LIDOCAINE HCL 1 % IJ SOLN
INTRAMUSCULAR | Status: AC
  Filled 2020-11-20: qty 20

## 2020-11-20 MED ORDER — MIDAZOLAM HCL 2 MG/2ML IJ SOLN
INTRAMUSCULAR | Status: AC | PRN
  Administered 2020-11-20: 0.5 mg via INTRAVENOUS

## 2020-11-20 MED ORDER — LIDOCAINE HCL 1 % IJ SOLN
INTRAMUSCULAR | Status: AC | PRN
Start: 2020-11-20 — End: 2020-11-20
  Administered 2020-11-20: 10 mL via INTRADERMAL

## 2020-11-20 MED ORDER — LOSARTAN POTASSIUM 50 MG PO TABS
50.0000 mg | ORAL_TABLET | Freq: Every day | ORAL | Status: DC
  Administered 2020-11-20 – 2020-11-22 (×3): 50 mg via ORAL
  Filled 2020-11-20 (×3): qty 1

## 2020-11-20 MED ORDER — GADOBUTROL 1 MMOL/ML IV SOLN
10.0000 mL | Freq: Once | INTRAVENOUS | Status: AC | PRN
  Administered 2020-11-20: 10 mL via INTRAVENOUS

## 2020-11-20 MED ORDER — FENTANYL CITRATE (PF) 100 MCG/2ML IJ SOLN
INTRAMUSCULAR | Status: AC
  Filled 2020-11-20: qty 2

## 2020-11-20 MED ORDER — FENTANYL CITRATE (PF) 100 MCG/2ML IJ SOLN
INTRAMUSCULAR | Status: AC | PRN
  Administered 2020-11-20: 25 ug via INTRAVENOUS

## 2020-11-20 MED ORDER — MIDAZOLAM HCL 2 MG/2ML IJ SOLN
INTRAMUSCULAR | Status: AC
  Filled 2020-11-20: qty 2

## 2020-11-20 NOTE — Care Management Important Message (Signed)
Important Message  Patient Details IM Letter given to the Patient. Name: Alex Edwards MRN: [id number] Date of Birth: [birth year]   Medicare Important Message Given:  Yes     Kerin Salen 11/20/2020, 11:53 AM

## 2020-11-20 NOTE — Evaluation (Signed)
Occupational Therapy Evaluation Patient Details Name: Alex Edwards MRN: [id number] DOB: 09-May-1936 Today's Date: 11/20/2020    History of Present Illness 85 yo male admitted with acute respiratory failure, hemoptysis, conjunctivitis, new lung mass on imaging. Hx of L THA 2015, obesity   Clinical Impression   Patient lives with spouse in a single level home and is independent at baseline with self care. Currently patient overall min G assist for functional ambulation/transfers and standing ADLs due to mild unsteadiness and shortness of breath on exertion. Upon arrival patient in bathroom on room air, reading 83% once seated in recliner. Cued patient in pursed lip breathing and donned 4L with increase to 91% after ~30 seconds seated recovery. Recommend continued acute OT services to maximize patient endurance, balance and overall safety in order to facilitate D/C home with spouse.    Follow Up Recommendations  No OT follow up;Supervision/Assistance - 24 hour (at least initial)    Equipment Recommendations  Tub/shower seat       Precautions / Restrictions Precautions Precautions: Fall Precaution Comments: monitor O2 Restrictions Weight Bearing Restrictions: No      Mobility Bed Mobility               General bed mobility comments: in chair    Transfers Overall transfer level: Needs assistance Equipment used: Rolling walker (2 wheeled) Transfers: Sit to/from Stand Sit to Stand: Min guard         General transfer comment: min G for safety with ambulation from bathroom and transfer in to chair, no loss of balance noted    Balance Overall balance assessment: Needs assistance Sitting-balance support: Feet supported Sitting balance-Leahy Scale: Good     Standing balance support: Single extremity supported;Bilateral upper extremity supported Standing balance-Leahy Scale: Poor Standing balance comment: reliant on UE support                           ADL  either performed or assessed with clinical judgement   ADL Overall ADL's : Needs assistance/impaired Eating/Feeding: Independent;Sitting   Grooming: Wash/dry hands;Supervision/safety;Standing   Upper Body Bathing: Set up;Sitting   Lower Body Bathing: Min guard;Sitting/lateral leans;Sit to/from stand   Upper Body Dressing : Set up;Sitting   Lower Body Dressing: Min guard;Sitting/lateral leans;Sit to/from stand   Toilet Transfer: Min guard;Ambulation;RW Toilet Transfer Details (indicate cue type and reason): upon arrival patient in bathroom standing, reports had "a mess" however pericare appears completed. patient asking to stand over toilet again to try and urinate "I've been having trouble going" min G for safety in standing Toileting- Clothing Manipulation and Hygiene: Min guard;Sit to/from stand       Functional mobility during ADLs: Min guard;Rolling walker General ADL Comments: assist patient with changing gown due to soiled with stool. desat to 83% on room air with bathroom ADLs, cue in PLB and donned 4L recover to 91% within ~30 seconds seated rest                  Pertinent Vitals/Pain Pain Assessment: No/denies pain     Hand Dominance  (did not specify)   Extremity/Trunk Assessment Upper Extremity Assessment Upper Extremity Assessment: Overall WFL for tasks assessed   Lower Extremity Assessment Lower Extremity Assessment: Defer to PT evaluation   Cervical / Trunk Assessment Cervical / Trunk Assessment: Normal   Communication Communication Communication: No difficulties   Cognition Arousal/Alertness: Awake/alert Behavior During Therapy: WFL for tasks assessed/performed Overall Cognitive Status: Within Functional Limits  for tasks assessed                                                Home Living Family/patient expects to be discharged to:: Private residence Living Arrangements: Spouse/significant other Available Help at Discharge:  Family Type of Home: House Home Access: Stairs to enter [employer] of Steps: 2 Entrance Stairs-Rails: None Home Layout: One level;Laundry or work area in [place]: Environmental consultant - 2 wheels;Cane - single point          Prior Functioning/Environment Level of Independence: Independent                 OT Problem List: Decreased activity tolerance;Impaired balance (sitting and/or standing);Cardiopulmonary status limiting activity      OT Treatment/Interventions: Self-care/ADL training;DME and/or AE instruction;Therapeutic activities;Patient/family education;Balance training    OT Goals(Current goals can be found in the care plan section) Acute Rehab OT Goals Patient Stated Goal: home soon! OT Goal Formulation: With patient Time For Goal Achievement: 12/04/20 Potential to Achieve Goals: Good  OT Frequency: Min 2X/week    AM-PAC OT "6 Clicks" Daily Activity     Outcome Measure Help from another person eating meals?: None Help from another person taking care of personal grooming?: A Little Help from another person toileting, which includes using toliet, bedpan, or urinal?: A Little Help from another person bathing (including washing, rinsing, drying)?: A Little Help from another person to put on and taking off regular upper body clothing?: A Little Help from another person to put on and taking off regular lower body clothing?: A Little 6 Click Score: 19   End of Session Equipment Utilized During Treatment: Rolling walker Nurse Communication: Mobility status  Activity Tolerance: Patient tolerated treatment well Patient left: in chair;with call bell/phone within reach;with chair alarm set  OT Visit Diagnosis: Unsteadiness on feet (R26.81);Other abnormalities of gait and mobility (R26.89)                Time: 0277-4128 OT Time Calculation (min): 14 min Charges:  OT General Charges $OT Visit: 1 Visit OT Evaluation $OT Eval Low  Complexity: [place] OT OT pager: [place] 11/20/2020, 12:35 PM

## 2020-11-20 NOTE — Procedures (Signed)
  Procedure: [place] core biopsy L neck mass inframandibular EBL:   minimal Complications:  none immediate  See full dictation in [employer].  Dillard Cannon MD Main # [phone] Pager  [phone] Mobile [phone]

## 2020-11-20 NOTE — Consult Note (Signed)
Radiation Oncology         [phone] ________________________________  Initial Inpatient Consultation - Conducted via telephone due to current COVID-19 concerns for limiting patient exposure  Name: Alex Edwards MRN: [id number]  Date of Service: 11/17/2020 DOB: 1935-08-11  HY:[id number], Elyse Jarvis, MD  No ref. provider found   REFERRING PHYSICIAN: No ref. provider found  DIAGNOSIS: 85 y.o. 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    ICD-10-CM   1. Lung mass  R91.8   2. Community acquired pneumonia, unspecified laterality  J18.9   3. Sepsis, due to unspecified organism, unspecified whether acute organ dysfunction present ([place])  A41.9   4. SOB (shortness of breath)  R06.02 DG C-ARM BRONCHOSCOPY    DG C-ARM BRONCHOSCOPY  5. Parotid gland enlargement  K11.1 [place] CORE BIOPSY (SALIVARY GLAND/PAROTID GLAND)    [place] CORE BIOPSY (SALIVARY GLAND/PAROTID GLAND)    CANCELED: [place] FNA BIOPSY SALIVARY GLAND PAROTID GLAND EA ADDT'L LESION    CANCELED: [place] FNA BIOPSY SALIVARY GLAND PAROTID GLAND EA ADDT'L LESION    HISTORY OF PRESENT ILLNESS: Alex Edwards is a 85 y.o. male seen at the request of Dr. Earlie Server.  He presented to the emergency department at [facility] on 11/17/2020 with complaints of a productive cough with scant hemoptysis for approximately 1 week and associated fever for the last several days.  Chest x-ray performed on admission showed a rounded, masslike opacity in the lateral left midlung measuring approximately 8 cm.  This was further evaluated with a CT chest which confirmed a large, 7.3 cm left upper lobe lung mass with internal areas of gas concerning for central necrosis, worrisome for primary bronchogenic carcinoma as well as surrounding interlobular septal thickening concerning for lymphangitic spread of tumor.  Additionally, there were enlarged left  paratracheal and subcarinal lymph nodes.  The patient underwent bronchoscopy for biopsy with Dr. Shearon Stalls on 11/18/2020 and final pathology confirmed non-small cell lung cancer consistent with squamous cell morphology. The patient also reported a palpable mass in the left jaw that has been present for over 30 years so this was further evaluated with a CT scan of the neck which was performed on 11/19/2020 and showed a 6.1 x 2.9 cm ovoid soft tissue focus within the left upper neck, along the inferior aspect of the left parotid gland favoring a primary parotid neoplasm versus nodal metastatic disease.  There were no other pathologically enlarged lymph nodes identified within the neck.  The patient was seen by Dr. Earlie Server on 11/19/2020 who recommended completing disease staging with an MRI brain scan and PET scan to determine the extent of his disease and help better inform treatment recommendations.  He did have the MRI brain scan earlier today and that was negative for any intracranial metastatic disease. We have been asked to consult with the patient today to discuss the potential role of radiotherapy in the management of his newly diagnosed lung cancer.  PREVIOUS RADIATION THERAPY: No  PAST MEDICAL HISTORY:  Past Medical History:  Diagnosis Date  . BCE (basal cell epithelioma)   . Bruises easily    on hands  . Cough    smokers cough or perfurmes  . Degeneration macular    right eye  . Diabetes mellitus without complication ([place])   . Dyslipidemia   . ED (erectile dysfunction)   . Hx of adenomatous colonic  polyps   . Hyperlipidemia   . Hypertension    borderline  . Obesity   . Psoriasis   . Seasonal allergies   . Smoker    former      PAST SURGICAL HISTORY: Past Surgical History:  Procedure Laterality Date  . BRONCHIAL BRUSHINGS  11/18/2020   Procedure: BRONCHIAL BRUSHINGS;  Surgeon: Spero Geralds, MD;  Location: [facility];  Service: Pulmonary;;  . BRONCHIAL NEEDLE ASPIRATION BIOPSY   11/18/2020   Procedure: BRONCHIAL NEEDLE ASPIRATION BIOPSIES;  Surgeon: Spero Geralds, MD;  Location: [facility];  Service: Pulmonary;;  . BRONCHIAL WASHINGS  11/18/2020   Procedure: BRONCHIAL WASHINGS;  Surgeon: Spero Geralds, MD;  Location: [facility];  Service: Pulmonary;;  . CATARACT EXTRACTION, BILATERAL Bilateral 2013  . COLONOSCOPY  2005   [facility]  . ENDOBRONCHIAL ULTRASOUND N/A 11/18/2020   Procedure: ENDOBRONCHIAL ULTRASOUND;  Surgeon: Spero Geralds, MD;  Location: [facility];  Service: Pulmonary;  Laterality: N/A;  . POLYPECTOMY    . SKIN CANCER EXCISION Right 1993   under eye-MOHS, freeze multiple places freq  . TONSILLECTOMY  as child  . TOTAL HIP ARTHROPLASTY Left 01/04/2014   Procedure: LEFT TOTAL HIP ARTHROPLASTY ANTERIOR APPROACH;  Surgeon: Mcarthur Rossetti, MD;  Location: [facility];  Service: Orthopedics;  Laterality: Left;  . TOTAL HIP ARTHROPLASTY Left 02/14/2014   Procedure: Irrigation and Debridement left hip;  Surgeon: Mcarthur Rossetti, MD;  Location: [facility];  Service: Orthopedics;  Laterality: Left;    FAMILY HISTORY:  Family History  Problem Relation Age of Onset  . Heart disease Brother     SOCIAL HISTORY:  Social History   Socioeconomic History  . Marital status: Married    Spouse name: Not on file  . Number of children: Not on file  . Years of education: Not on file  . Highest education level: Not on file  Occupational History  . Not on file  Tobacco Use  . Smoking status: Current Some Day Smoker    Packs/day: 1.00    Years: 50.00    Pack years: 50.00    Types: Cigarettes    Last attempt to quit: 04/28/2014    Years since quitting: 6.5  . Smokeless tobacco: Never Used  Substance and Sexual Activity  . Alcohol use: No    Alcohol/week: 0.0 standard drinks  . Drug use: No  . Sexual activity: Yes  Other Topics Concern  . Not on file  Social History Narrative  . Not on file   Social Determinants of Health   Financial  Resource Strain: Not on file  Food Insecurity: Not on file  Transportation Needs: Not on file  Physical Activity: Not on file  Stress: Not on file  Social Connections: Not on file  Intimate Partner Violence: Not on file    ALLERGIES: Vancomycin and Latex  MEDICATIONS:  Current Facility-Administered Medications  Medication Dose Route Frequency Provider Last Rate Last Admin  . acetaminophen (TYLENOL) tablet 650 mg  650 mg Oral Q6H PRN Spero Geralds, MD   650 mg at 11/18/20 2246   Or  . acetaminophen (TYLENOL) suppository 650 mg  650 mg Rectal Q6H PRN Spero Geralds, MD      . albuterol (PROVENTIL) (2.5 MG/3ML) 0.083% nebulizer solution 2.5 mg  2.5 mg Nebulization Q2H PRN Spero Geralds, MD      . azithromycin (ZITHROMAX) 500 mg in sodium chloride 0.9 % 250 mL IVPB  500 mg Intravenous Q24H Darliss Cheney, MD  250 mL/hr at 11/19/20 1645 500 mg at 11/19/20 1645  . cefTRIAXone (ROCEPHIN) 2 g in sodium chloride 0.9 % 100 mL IVPB  2 g Intravenous Q24H Pahwani, Einar Grad, MD 200 mL/hr at 11/20/20 1137 2 g at 11/20/20 1137  . insulin aspart (novoLOG) injection 0-9 Units  0-9 Units Subcutaneous TID WC Spero Geralds, MD   1 Units at 11/19/20 1644  . ketoconazole (NIZORAL) 2 % cream   Topical Daily PRN Spero Geralds, MD      . labetalol (NORMODYNE) injection 10 mg  10 mg Intravenous Q4H PRN Nicole Kindred A, DO   10 mg at 11/20/20 0649  . losartan (COZAAR) tablet 50 mg  50 mg Oral Daily Pahwani, Ravi, MD      . metoprolol tartrate (LOPRESSOR) tablet 50 mg  50 mg Oral BID Spero Geralds, MD   50 mg at 11/20/20 1100  . ofloxacin (OCUFLOX) 0.3 % ophthalmic solution 1 drop  1 drop Both Eyes QID Spero Geralds, MD   1 drop at 11/20/20 1400  . polyvinyl alcohol (LIQUIFILM TEARS) 1.4 % ophthalmic solution 1 drop  1 drop Left Eye TID Spero Geralds, MD   1 drop at 11/20/20 1100  . sodium chloride flush (NS) 0.9 % injection 3 mL  3 mL Intravenous Q12H Spero Geralds, MD   3 mL at 11/20/20 1100     REVIEW OF SYSTEMS:  On review of systems, the patient reports that he is doing fairly well in general. He denies any chest pain, increased shortness of breath, fever, chills, night sweats, or unintended weight changes. He has continued with a productive cough with occasional, scant hemoptysis but reports that his symptoms are improved on oxygen via nasal canula. He denies any bowel or bladder disturbances, and denies abdominal pain, nausea or vomiting. He denies any new musculoskeletal or joint aches or pains. A complete review of systems is obtained and is otherwise negative.  PHYSICAL EXAM:  Wt Readings from Last 3 Encounters:  11/18/20 238 lb (108 kg)  01/29/20 252 lb (114.3 kg)  01/11/20 253 lb (114.8 kg)   Temp Readings from Last 3 Encounters:  11/20/20 98 F (36.7 C) (Oral)  01/29/20 97.7 F (36.5 C)  01/24/19 98.4 F (36.9 C)   BP Readings from Last 3 Encounters:  11/20/20 (!) 160/77  01/29/20 130/76  01/11/20 (!) 174/86   Pulse Readings from Last 3 Encounters:  11/20/20 78  01/29/20 76  01/11/20 85   Pain Assessment Pain Score: 0-No pain/10  Unable to assess due to telephone consult visit format.   KPS = 90  100 - Normal; no complaints; no evidence of disease. 90   - Able to carry on normal activity; minor signs or symptoms of disease. 80   - Normal activity with effort; some signs or symptoms of disease. 39   - Cares for self; unable to carry on normal activity or to do active work. 60   - Requires occasional assistance, but is able to care for most of his personal needs. 50   - Requires considerable assistance and frequent medical care. 76   - Disabled; requires special care and assistance. 28   - Severely disabled; hospital admission is indicated although death not imminent. 38   - Very sick; hospital admission necessary; active supportive treatment necessary. 10   - Moribund; fatal processes progressing rapidly. 0     - Dead  Karnofsky DA, Abelmann Joes,  Craver LS and Burchenal [facility] (  1948) The use of the nitrogen mustards in the palliative treatment of carcinoma: with particular reference to bronchogenic carcinoma Cancer 1 634-56  LABORATORY DATA:  Lab Results  Component Value Date   WBC 18.4 (H) 11/20/2020   HGB 12.7 (L) 11/20/2020   HCT 41.2 11/20/2020   MCV 99.0 11/20/2020   PLT 417 (H) 11/20/2020   Lab Results  Component Value Date   NA 142 11/20/2020   K 4.3 11/20/2020   CL 104 11/20/2020   CO2 31 11/20/2020   Lab Results  Component Value Date   ALT 18 11/17/2020   AST 25 11/17/2020   ALKPHOS 70 11/17/2020   BILITOT 0.5 11/17/2020     RADIOGRAPHY: CT SOFT TISSUE NECK WO CONTRAST  Result Date: 11/19/2020 CLINICAL DATA:  Parotid region mass. Additional history provided: Patient reports lump under left jaw for 30+ years. EXAM: CT NECK WITHOUT CONTRAST TECHNIQUE: Multidetector CT imaging of the neck was performed following the standard protocol without intravenous contrast. COMPARISON:  Chest CT 11/17/2020. FINDINGS: Mildly motion degraded exam. Pharynx and larynx: No appreciable swelling or discrete mass within the oral cavity, pharynx or larynx on this noncontrast examination. Salivary glands: 6.1 x 2.9 cm ovoid slightly heterogeneous soft tissue focus along the inferior aspect of the left parotid gland, extending to the left level 2 station and overlying the anterior aspect of the left sternocleidomastoid muscle. The right parotid and bilateral submandibular glands are unremarkable. Thyroid: Unremarkable. Lymph nodes: Please refer to salivary gland findings above. Elsewhere within the neck, no pathologically enlarged cervical chain lymph nodes are identified. Vascular: There is limited assessment of the major vascular structures of the neck in the absence of intravenous contrast. Calcified atherosclerotic plaque within the visualized aortic arch, proximal major branch vessels of the neck and carotid arteries. Calcified plaque within the  proximal right ICA could result in a hemodynamically significant stenosis. Limited intracranial: No acute intracranial abnormality identified within the field of view. Visualized orbits: Incompletely imaged. No mass or acute finding at the imaged levels. Mastoids and visualized paranasal sinuses: No significant paranasal sinus disease or mastoid effusion at the imaged levels. Skeleton: Reversal of the expected cervical lordosis. Mild C6-C7 and C7-T1 grade 1 anterolisthesis. Cervical spondylosis. No acute bony abnormality or aggressive osseous lesion identified. Upper chest: Incompletely imaged known large left upper lobe lung mass. IMPRESSION: 6.1 x 2.9 cm ovoid soft tissue focus within the left upper neck, along the inferior aspect of the left parotid gland. Favored differential considerations are primary parotid neoplasm versus nodal metastatic disease. Direct tissue sampling should be considered. Elsewhere within the neck, no pathologically enlarged lymph nodes are identified. Partially imaged known left upper lobe pulmonary mass suspicious for bronchogenic carcinoma. Atherosclerotic disease as described. Notably, calcified plaque at the origin of the right ICA could result in a hemodynamically significant stenosis. Carotid artery duplex recommended for further evaluation. Cervical spondylosis. Electronically Signed   By: Kellie Simmering DO   On: 11/19/2020 13:25   CT Chest Wo Contrast  Result Date: 11/17/2020 CLINICAL DATA:  Short of breath. EXAM: CT CHEST WITHOUT CONTRAST TECHNIQUE: Multidetector CT imaging of the chest was performed following the standard protocol without IV contrast. COMPARISON:  01/03/2014 FINDINGS: Cardiovascular: Heart size is normal. Aortic atherosclerosis. Coronary artery calcifications. No pericardial effusion. Mediastinum/Nodes: Normal appearance of the thyroid gland. The trachea appears patent and is midline. Normal appearance of the esophagus. No axillary or supraclavicular  adenopathy. Left paratracheal lymph node measures 1.4 cm, image 59/2. Subcarinal node measures 1.3  cm, image 73/2. The hilar lymph nodes are suboptimally evaluated due to lack of IV contrast material. Lungs/Pleura: Within the left upper lobe there is a large mass measuring 7.3 cm in maximum dimension, image 65/5 and image 90/6. Internal areas of gas within this mass are noted concerning for central necrosis. Surrounding interlobular septal thickening is noted within the left upper lobe concerning for lymphangitic spread of tumor. Scattered areas of tree-in-bud nodularity are identified throughout both lungs most notable within the lower lung zones. Peripheral areas of subpleural consolidation noted within the posterior and lateral right lower lobe. Upper Abdomen: No acute abnormality within the imaged portions of the upper abdomen. Nodule in the left adrenal gland measures 1.9 cm, image 157/2. Unchanged from 2015 compatible with a benign abnormality. Musculoskeletal: No chest wall mass or suspicious bone lesions identified. IMPRESSION: 1. Large left upper lobe lung mass is identified with internal areas of gas concerning for central necrosis. Findings are worrisome for primary bronchogenic carcinoma. Surrounding interlobular septal thickening is concerning for lymphangitic spread of tumor. Further evaluation with PET-CT and tissue sampling is advised. 2. Enlarged left paratracheal and subcarinal lymph nodes are identified. Cannot rule out metastatic adenopathy. 3. Scattered areas of tree-in-bud nodularity are identified throughout both lungs consistent with inflammatory or infectious bronchiolitis. 4. Peripheral areas of subpleural consolidation within the posterior and lateral right lower lobe are noted which may represent areas of atelectasis or pneumonia. 5. Aortic atherosclerosis. Coronary artery calcifications. Aortic Atherosclerosis (ICD10-I70.0). Electronically Signed   By: Kerby Moors M.D.   On:  11/17/2020 13:52   MR BRAIN W WO CONTRAST  Result Date: 11/20/2020 CLINICAL DATA:  Lung cancer, staging EXAM: MRI HEAD WITHOUT AND WITH CONTRAST TECHNIQUE: Multiplanar, multiecho pulse sequences of the brain and surrounding structures were obtained without and with intravenous contrast. CONTRAST:  75mL GADAVIST GADOBUTROL 1 MMOL/ML IV SOLN COMPARISON:  None. FINDINGS: Brain: There is no acute infarction or intracranial hemorrhage. There is no intracranial mass, mass effect, or edema. There is no hydrocephalus or extra-axial fluid collection. Ventricles and sulci are within normal limits in size and configuration. Patchy small foci of T2 hyperintensity in the supratentorial white matter are nonspecific but may reflect minor chronic microvascular ischemic changes. No abnormal enhancement. Vascular: Major vessel flow voids at the skull base are preserved. Skull and upper cervical spine: Normal marrow signal is preserved. Sinuses/Orbits: Paranasal sinuses are aerated. Bilateral lens replacements. Other: Sella is partially empty. Mastoid air cells are clear. Partially imaged left neck mass along the inferior parotid better evaluated on recent neck CT. IMPRESSION: No evidence of intracranial metastatic disease. Electronically Signed   By: Macy Mis M.D.   On: 11/20/2020 09:00   DG Chest Portable 1 View  Result Date: 11/17/2020 CLINICAL DATA:  Worsening cough, congestion, shortness of breath. Bloods tinged sputum. EXAM: PORTABLE CHEST 1 VIEW COMPARISON:  February 14, 2018. FINDINGS: Rounded/masslike 8 cm opacity in the lateral left midlung. Mild diffuse interstitial prominence. No visible pleural effusions or pneumothorax. Similar cardiomediastinal silhouette. IMPRESSION: 1. Rounded/masslike 8 cm opacity in the lateral left midlung. Recommend chest CT to evaluate for possible mass. 2. Diffuse interstitial prominence, possibly chronic bronchitic related change given similar findings on the prior. The recommend  chest CT can also better characterize this finding and evaluate for superimposed interstitial edema or atypical infection. Electronically Signed   By: Margaretha Sheffield MD   On: 11/17/2020 12:05   [place] CORE BIOPSY (SALIVARY GLAND/PAROTID GLAND)  Result Date: 11/20/2020 CLINICAL DATA:  Parotid region  mass, left submandibular lump EXAM: ULTRASOUND GUIDED CORE BIOPSY OF LEFT NECK MASS MEDICATIONS: Intravenous Fentanyl 35mcg and Versed 1mg  were administered as conscious sedation during continuous monitoring of the patient's level of consciousness and physiological / cardiorespiratory status by the radiology RN, with a total moderate sedation time of 20 minutes. PROCEDURE: The procedure, risks, benefits, and alternatives were explained to the patient. Questions regarding the procedure were encouraged and answered. The patient understands and consents to the procedure. Survey ultrasound of the left neck was performed. The complex left neck mass was localized and an appropriate skin entry site was determined and marked. The operative field was prepped with chlorhexidine in a sterile fashion, and a sterile drape was applied covering the operative field. A sterile gown and sterile gloves were used for the procedure. Local anesthesia was provided with 1% Lidocaine. Under real-time ultrasound guidance, a 17 gauge trocar needle was advanced to the margin of the lesion. Once needle tip position was confirmed, coaxial 18-gauge core biopsy samples were obtained, submitted in saline to surgical pathology. The guide needle was removed. Postprocedure scans show no hemorrhage or other apparent complication. The patient tolerated the procedure well. COMPLICATIONS: None. FINDINGS: Complex left neck mass was localized corresponding to CT findings. Representative core biopsy samples obtained as above. IMPRESSION: 1. Technically successful ultrasound-guided core biopsy, left neck mass. Electronically Signed   By: Lucrezia Europe M.D.   On:  11/20/2020 13:47   VAS US CAROTID  Result Date: 11/19/2020 Carotid Arterial Duplex Study Patient Name:  Alex Edwards  Date of Exam:   11/19/2020 Medical Rec #: [id number]        Accession #:    [id number] Date of Birth: July 15, 1935         Patient Gender: M Patient Age:   85Y Exam Location:  [facility] Procedure:      VAS US CAROTID Referring Phys: [id number] Pisek --------------------------------------------------------------------------------  Indications:       Stenosis. Risk Factors:      Hypertension, hyperlipidemia. Comparison Study:  No prior studies. Performing Technologist: Oliver Hum RVT  Examination Guidelines: A complete evaluation includes B-mode imaging, spectral Doppler, color Doppler, and power Doppler as needed of all accessible portions of each vessel. Bilateral testing is considered an integral part of a complete examination. Limited examinations for reoccurring indications may be performed as noted.  Right Carotid Findings: +----------+--------+--------+--------+--------------------------+--------+           PSV cm/sEDV cm/sStenosisPlaque Description        Comments +----------+--------+--------+--------+--------------------------+--------+ CCA Prox  99      20              smooth and heterogenous            +----------+--------+--------+--------+--------------------------+--------+ CCA Distal64      15              smooth and heterogenous            +----------+--------+--------+--------+--------------------------+--------+ ICA Prox  453     165     80-99%  irregular and heterogenous         +----------+--------+--------+--------+--------------------------+--------+ ICA Mid   267     91      60-79%                                     +----------+--------+--------+--------+--------------------------+--------+ ICA Distal130     54  tortuous  +----------+--------+--------+--------+--------------------------+--------+ ECA       233     15                                                 +----------+--------+--------+--------+--------------------------+--------+ +----------+--------+-------+--------+-------------------+           PSV cm/sEDV cmsDescribeArm Pressure (mmHG) +----------+--------+-------+--------+-------------------+ Subclavian213                                        +----------+--------+-------+--------+-------------------+ +---------+--------+--+--------+--+---------+ VertebralPSV cm/s58EDV cm/s14Antegrade +---------+--------+--+--------+--+---------+  Left Carotid Findings: +----------+--------+--------+--------+-----------------------+--------+           PSV cm/sEDV cm/sStenosisPlaque Description     Comments +----------+--------+--------+--------+-----------------------+--------+ CCA Prox  129     26              smooth and heterogenous         +----------+--------+--------+--------+-----------------------+--------+ CCA Distal119     28              smooth and heterogenous         +----------+--------+--------+--------+-----------------------+--------+ ICA Prox  133     46      40-59%  smooth and heterogenous         +----------+--------+--------+--------+-----------------------+--------+ ICA Distal199     42                                     tortuous +----------+--------+--------+--------+-----------------------+--------+ ECA       193     12                                              +----------+--------+--------+--------+-----------------------+--------+ +----------+--------+--------+--------+-------------------+           PSV cm/sEDV cm/sDescribeArm Pressure (mmHG) +----------+--------+--------+--------+-------------------+ [id number]                                         +----------+--------+--------+--------+-------------------+  +---------+--------+--+--------+--+---------+ VertebralPSV cm/s59EDV cm/s15Antegrade +---------+--------+--+--------+--+---------+   Summary: Right Carotid: Velocities in the right ICA are consistent with a 80-99%                stenosis. Left Carotid: Velocities in the left ICA are consistent with a 40-59% stenosis. Vertebrals: Bilateral vertebral arteries demonstrate antegrade flow. *See table(s) above for measurements and observations.  Electronically signed by Harold Barban MD on 11/19/2020 at 9:21:07 PM.    Final    DG C-ARM BRONCHOSCOPY  Result Date: 11/18/2020 C-ARM BRONCHOSCOPY: Fluoroscopy was utilized by the requesting physician.  No radiographic interpretation.      IMPRESSION/PLAN:  This visit was conducted via telephone to spare the patient unnecessary potential exposure in the healthcare setting during the current COVID-19 pandemic.   91. 85 y.o. 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 Today, I talked to the patient about the findings and workup thus far.  We do recommend proceeding with PET imaging to complete his disease staging  prior to moving forward with treatment unless his hemoptysis were to increase or should he develop more difficulty breathing, in which case we could proceed with radiation sooner.  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 We discussed the available radiation techniques, and focused on the details and logistics of delivery.  Pending the PET scan confirms localized disease only, we would recommend proceeding with a 6-1/2-week course of daily radiotherapy concurrent with chemotherapy.   We reviewed the anticipated acute and late sequelae associated with radiation in this setting.  He understands that our recommendations could  potentially change should he be found to have more widespread metastatic disease. The patient was encouraged to ask questions that were answered to his stated satisfaction.  The patient is anxious to be discharged home from the hospital but he understands that the timing of discharge will be at the discretion of his hospital medicine team.  From our perspective, once he is deemed stable for discharge, there is no reason that he would need to remain inpatient from a treatment standpoint but will need the outpatient PET imaging coordinated.  He is comfortable and in agreement with the stated plan.  We will share our discussion with Dr. Earlie Server and his treatment team and once we have the results of the PET scan, we will re-connect with the patient and move forward with treatment planning accordingly.  Given current concerns for patient exposure during the COVID-19 pandemic, this encounter was conducted via telephone.  The patient has given verbal consent for this type of encounter. In a visit lasting 70 minutes, greater than 50% of that time was spent in floor time, reviewing his chart in preparation for our discussion, discussing his case, documenting and coordinating his care. The attendants for this meeting include Oziel Beitler PA-C, and patient, Alex Edwards, Edwards.. During the encounter, Jessicamarie Amiri PA-C, was located at [facility] Radiation Oncology Department.  Patient, Alex Edwards, Edwards., was located in room 1420 at [place], PA-C    [name], MD  [facility] Direct Dial: [phone]  Fax: [phone] [email or web address]  Skype  [email or web address]

## 2020-11-20 NOTE — Progress Notes (Signed)
Patient has been bladder scanned x 2, first 336, second 298 cc. Pt complains of bladder discomfort. In and cath completed 400 cc. Will continue to monitor

## 2020-11-20 NOTE — Progress Notes (Signed)
Pt refused CPAP qhs.  Pt encouraged to contact RT should he change his mind.  Machine remains in room.

## 2020-11-20 NOTE — Progress Notes (Signed)
Patient to transport to MRI. Hospitalist provider contacted. V.O received. Pt may transport without telemetry.

## 2020-11-20 NOTE — Progress Notes (Signed)
PaSATURATION QUALIFICATIONS:   Patient Saturations on Room Air at Rest = 90 %  Patient Saturations on Room Air while Ambulating = 88 %  Patient Saturations on 4 Liters of oxygen while Ambulating = 93-95%  Please briefly explain why patient needs home oxygen: Patient denies experiencing difficulty breathing although the pt appears to be sob. Pt ambulated in room about 20 feet. Activity tolerated well sat deceased 86-88% on room air with activity.

## 2020-11-20 NOTE — Progress Notes (Signed)
PROGRESS NOTE    Alex Edwards  ZHY:[id number] DOB: 31-May-1936 DOA: 11/17/2020 PCP: [name], MD   Brief Narrative:  85 year old male with past medical history of hypertension, hyperlipidemia,basal cell epithelioma,tobacco abuse, type 2 diabeteswho presented to the ED with gradually worsening shortness of breath on exertion and rest,productive cough, hemoptysisand fever for several days. he smokes 1 pack/day of cigarettes intermittently for years and quit last week when his breathing and coughing were worsening.  He met criteria for severe sepsis in the ED with tachycardic, tachypnea, hypoxia (SpO2 86% on room air) placed on4L/min in the setting of likely post-obstructive Pneumonia secondary to a newly discovered left upper lobe lung mass identified on CT chest in the ED.  Patient started on IV antibiotics for CAP and pulmonology consulted for bronchoscopy to obtain tissue diagnosis.  Assessment & Plan:   Active Problems:   Acute respiratory failure with hypoxia (HCC)   Severe sepsis (HCC)   CAP (community acquired pneumonia)   Lung mass   Conjunctivitis   Acute respiratory failure with hypoxia and and severe sepsis secondary to CAP, postobstructive pneumonia in setting of Left upper lobe lung mass with lymphadenopathy: Large mass concerning for lung malignancy.  Status post bronchoscopy and biopsy and pathology shows NSCLC.  Oncology wants another bronchoscopy and biopsy and they will do this as outpatient.  Oncology on board and they have ordered MRI brain to be done here and PET scan as outpatient.  Patient is still on 5 L of oxygen but feels comfortable without shortness of breath.  We will plan on oxygen.  Continue Rocephin and Zithromax for total of 5 days.  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  According to him, the mass has been there for 30+ years and his PCP knows about it  and has not changed in any form.  CT soft tissue neck was done which shows possibility of primary mandibular cancer versus metastasis from lung cancer.  IR was consulted and patient underwent ultrasound-guided core biopsy of the mandible mass today on 11/20/2020.  Right ICA stenosis: CT soft tissue neck found out incidental finding of right ICA stenosis.  Doppler carotid confirmed that right ICA is stenosed 80 to 99%.  I have consulted vascular surgery for this.  Patient has no signs or symptoms of stroke.  Left eye conjunctivitis: Resolved.  AKI: Resolved.  Erronous hyperkalemia: BMP showed potassium of 6.1 today which was repeated and potassium is normal.  Hypertension: Slightly elevated.  Continue Lopressor and resume losartan.  Type 2 diabetes: Blood sugar controlled.  Continue SSI.  Hyperlipidemia -not on statin for some reason.  Will discuss with him about this.  Tobacco use disorder -1 PPD smoking history, quit last week due to symptoms outlined above.  Obesity: Body mass index is 33.16 kg/m.  Complicates overall care and prognosis.    DVT prophylaxis: SCDs Start: 11/17/20 1454   Code Status: Full Code  Family Communication:  None present at bedside.  Plan of care discussed with patient in length and he verbalized understanding and agreed with it.  Status is: Inpatient  Remains inpatient appropriate because:Inpatient level of care appropriate due to severity of illness   Dispo: The patient is from: Home              Anticipated d/c is to: Home              Patient currently is not medically  stable to d/c.   Difficult to place patient No        Estimated body mass index is 31.4 kg/m as calculated from the following:   Height as of this encounter: _0  (1.854 m).   Weight as of this encounter: 108 kg.      Nutritional status:               Consultants:   Oncology  PCCM  Procedures:   Bronchoscopy  Antimicrobials:  Anti-infectives  (From admission, onward)   Start     Dose/Rate Route Frequency Ordered Stop   11/17/20 1145  cefTRIAXone (ROCEPHIN) 2 g in sodium chloride 0.9 % 100 mL IVPB        2 g 200 mL/hr over 30 Minutes Intravenous Every 24 hours 11/17/20 1130 11/22/20 1144   11/17/20 1145  azithromycin (ZITHROMAX) 500 mg in sodium chloride 0.9 % 250 mL IVPB        500 mg 250 mL/hr over 60 Minutes Intravenous Every 24 hours 11/17/20 1130 11/22/20 1759         Subjective: Patient seen and examined.  He has no complaints.  Breathing is improved.  Objective: Vitals:   11/20/20 1220 11/20/20 1240 11/20/20 1250 11/20/20 1318  BP: (!) 156/67 (!) 144/69 (!) 156/67 (!) 160/77  Pulse: 75 75  78  Resp: (!) _1 Temp:    98 F (36.7 C)  TempSrc:    Oral  SpO2: 100% 100%  99%  Weight:      Height:        Intake/Output Summary (Last 24 hours) at 11/20/2020 1352 Last data filed at 11/20/2020 1200 Gross per 24 hour  Intake 390.9 ml  Output 1175 ml  Net -784.1 ml   Filed Weights   11/17/20 1058 11/18/20 1222  Weight: 114 kg 108 kg    Examination:  General exam: Appears calm and comfortable, morbidly obese Respiratory system: Slightly diminished breath sounds with rhonchi bilaterally. Respiratory effort normal. Cardiovascular system: S1 & S2 heard, RRR. No JVD, murmurs, rubs, gallops or clicks. No pedal edema. Gastrointestinal system: Abdomen is nondistended, soft and nontender. No organomegaly or masses felt. Normal bowel sounds heard. Central nervous system: Alert and oriented. No focal neurological deficits. Extremities: Symmetric 5 x 5 power. Skin: No rashes, lesions or ulcers.  Psychiatry: Judgement and insight appear normal. Mood & affect appropriate.    Data Reviewed: I have personally reviewed following labs and imaging studies  CBC: Recent Labs  Lab 11/17/20 1144 11/18/20 0443 11/19/20 0157 11/20/20 0437  WBC 18.7* 17.8* 15.9* 18.4*  NEUTROABS 16.1*  --   --   --   HGB 12.8* 11.9*  12.5* 12.7*  HCT 40.3 38.3* 40.5 41.2  MCV 95.3 98.0 99.0 99.0  PLT 352 343 337 662*   Basic Metabolic Panel: Recent Labs  Lab 11/17/20 1144 11/18/20 0443 11/19/20 0157 11/20/20 0437 11/20/20 1010  NA 136 138 139 142  --   K 4.5 4.6 5.1 6.1* 4.3  CL 103 102 103 104  --   CO2 _2 --   GLUCOSE 221* 135* 217* 110*  --   BUN 24* 19 33* 29*  --   CREATININE 1.34* 1.04 0.96 1.02  --   CALCIUM 8.3* 8.3* 8.5* 8.6*  --    GFR: Estimated Creatinine Clearance: 70.7 mL/min (by C-G formula based on SCr of 1.02 mg/dL). Liver Function Tests: Recent Labs  Lab 11/17/20 1144  AST 25  ALT 18  ALKPHOS 70  BILITOT 0.5  PROT 7.3  ALBUMIN 3.2*   No results for input(s): LIPASE, AMYLASE in the last 168 hours. No results for input(s): AMMONIA in the last 168 hours. Coagulation Profile: Recent Labs  Lab 11/17/20 1144  INR 1.1   Cardiac Enzymes: No results for input(s): CKTOTAL, CKMB, CKMBINDEX, TROPONINI in the last 168 hours. BNP (last 3 results) No results for input(s): PROBNP in the last 8760 hours. HbA1C: Recent Labs    11/18/20 0443  HGBA1C 6.4*   CBG: Recent Labs  Lab 11/19/20 1128 11/19/20 1641 11/19/20 2023 11/20/20 0739 11/20/20 1118  GLUCAP 154* 132* 117* 108* 120*   Lipid Profile: No results for input(s): CHOL, HDL, LDLCALC, TRIG, CHOLHDL, LDLDIRECT in the last 72 hours. Thyroid Function Tests: No results for input(s): TSH, T4TOTAL, FREET4, T3FREE, THYROIDAB in the last 72 hours. Anemia Panel: No results for input(s): VITAMINB12, FOLATE, FERRITIN, TIBC, IRON, RETICCTPCT in the last 72 hours. Sepsis Labs: Recent Labs  Lab 11/17/20 1144 11/17/20 1330  LATICACIDVEN 2.6* 2.0*    Recent Results (from the past 240 hour(s))  Resp Panel by RT-PCR (Flu A&B, Covid) Nasopharyngeal Swab     Status: None   Collection Time: 11/17/20 11:16 AM   Specimen: Nasopharyngeal Swab; Nasopharyngeal(NP) swabs in vial transport medium  Result Value Ref Range Status    SARS Coronavirus 2 by RT PCR NEGATIVE NEGATIVE Final    Comment: (NOTE) SARS-CoV-2 target nucleic acids are NOT DETECTED.  The SARS-CoV-2 RNA is generally detectable in upper respiratory specimens during the acute phase of infection. The lowest concentration of SARS-CoV-2 viral copies this assay can detect is 138 copies/mL. A negative result does not preclude SARS-Cov-2 infection and should not be used as the sole basis for treatment or other patient management decisions. A negative result may occur with  improper specimen collection/handling, submission of specimen other than nasopharyngeal swab, presence of viral mutation(s) within the areas targeted by this assay, and inadequate number of viral copies(<138 copies/mL). A negative result must be combined with clinical observations, patient history, and epidemiological information. The expected result is Negative.  Fact Sheet for Patients:  EntrepreneurPulse.com.au  Fact Sheet for Healthcare Providers:  IncredibleEmployment.be  This test is no t yet approved or cleared by the [place] FDA and  has been authorized for detection and/or diagnosis of SARS-CoV-2 by FDA under an Emergency Use Authorization (EUA). This EUA will remain  in effect (meaning this test can be used) for the duration of the COVID-19 declaration under Section 564(b)(1) of the Act, 21 U.S.C.section 360bbb-3(b)(1), unless the authorization is terminated  or revoked sooner.       Influenza A by PCR NEGATIVE NEGATIVE Final   Influenza B by PCR NEGATIVE NEGATIVE Final    Comment: (NOTE) The Xpert Xpress SARS-CoV-2/FLU/RSV plus assay is intended as an aid in the diagnosis of influenza from Nasopharyngeal swab specimens and should not be used as a sole basis for treatment. Nasal washings and aspirates are unacceptable for Xpert Xpress SARS-CoV-2/FLU/RSV testing.  Fact Sheet for  Patients: EntrepreneurPulse.com.au  Fact Sheet for Healthcare Providers: IncredibleEmployment.be  This test is not yet approved or cleared by the [place] FDA and has been authorized for detection and/or diagnosis of SARS-CoV-2 by FDA under an Emergency Use Authorization (EUA). This EUA will remain in effect (meaning this test can be used) for the duration of the COVID-19 declaration under Section 564(b)(1) of the Act, 21 U.S.C. section 360bbb-3(b)(1), unless the authorization is terminated  or revoked.  Performed at [facility], [place] [address], [place], [place] [address]   Blood Culture (routine x 2)     Status: None (Preliminary result)   Collection Time: 11/17/20 11:30 AM   Specimen: BLOOD RIGHT ARM  Result Value Ref Range Status   Specimen Description   Final    BLOOD RIGHT ARM Performed at [place] [address], [place], [place] [address]    Special Requests   Final    BOTTLES DRAWN AEROBIC AND ANAEROBIC Blood Culture adequate volume Performed at [facility] [address], [place], [place] [address]    Culture   Final    NO GROWTH 3 DAYS Performed at [facility], [place] [address], [place], [place] [address]    Report Status PENDING  Incomplete  Blood Culture (routine x 2)     Status: None (Preliminary result)   Collection Time: 11/17/20 11:35 AM   Specimen: BLOOD  Result Value Ref Range Status   Specimen Description   Final    BLOOD LEFT ANTECUBITAL Performed at [place] [address] [place], [place] [address]    Special Requests   Final    BOTTLES DRAWN AEROBIC AND ANAEROBIC Blood Culture adequate volume Performed at [facility] [address] [place], [place] [address]    Culture   Final    NO GROWTH 3 DAYS Performed at [facility], [place] [address], [place], [place] [address]    Report Status  PENDING  Incomplete  Urine culture     Status: None   Collection Time: 11/17/20  4:45 PM   Specimen: In/Out Cath Urine  Result Value Ref Range Status   Specimen Description   Final    IN/OUT CATH URINE Performed at [facility] [address] [place], [place] [address]    Special Requests   Final    NONE Performed at [facility], [place] [address] [place], [place] [address]    Culture   Final    NO GROWTH Performed at [facility], [place] [address], [place], [place] [address]    Report Status 11/19/2020 FINAL  Final  MRSA PCR Screening     Status: None   Collection Time: 11/17/20  5:35 PM   Specimen: Nasopharyngeal  Result Value Ref Range Status   MRSA by PCR NEGATIVE NEGATIVE Final    Comment:        The GeneXpert MRSA Assay (FDA approved for NASAL specimens only), is one component of a comprehensive MRSA colonization surveillance program. It is not intended to diagnose MRSA infection nor to guide or monitor treatment for MRSA infections. Performed at [facility], [place] [address], [place], [place] [address]   Culture, Respiratory w Gram Stain     Status: None (Preliminary result)   Collection Time: 11/18/20  3:15 PM   Specimen: Bronchial Alveolar Lavage; Respiratory  Result Value Ref Range Status   Specimen Description   Final    BRONCHIAL ALVEOLAR LAVAGE LUL Performed at [place] [address] [place], [place] [address]    Special Requests   Final    NONE Performed at [facility], [place] [address] [place], [place] [address]    Gram Stain   Final    ABUNDANT WBC PRESENT, PREDOMINANTLY PMN NO ORGANISMS SEEN    Culture   Final    NO GROWTH  2 DAYS Performed at [facility], [place] [address], [place], [place] [address]    Report Status PENDING  Incomplete      Radiology Studies: CT SOFT TISSUE NECK WO CONTRAST  Result Date: 11/19/2020 CLINICAL DATA:   Parotid region mass. Additional history provided: Patient reports lump under left jaw for 30+ years. EXAM: CT NECK WITHOUT CONTRAST TECHNIQUE: Multidetector CT imaging of the neck was performed following the standard protocol without intravenous contrast. COMPARISON:  Chest CT 11/17/2020. FINDINGS: Mildly motion degraded exam. Pharynx and larynx: No appreciable swelling or discrete mass within the oral cavity, pharynx or larynx on this noncontrast examination. Salivary glands: 6.1 x 2.9 cm ovoid slightly heterogeneous soft tissue focus along the inferior aspect of the left parotid gland, extending to the left level 2 station and overlying the anterior aspect of the left sternocleidomastoid muscle. The right parotid and bilateral submandibular glands are unremarkable. Thyroid: Unremarkable. Lymph nodes: Please refer to salivary gland findings above. Elsewhere within the neck, no pathologically enlarged cervical chain lymph nodes are identified. Vascular: There is limited assessment of the major vascular structures of the neck in the absence of intravenous contrast. Calcified atherosclerotic plaque within the visualized aortic arch, proximal major branch vessels of the neck and carotid arteries. Calcified plaque within the proximal right ICA could result in a hemodynamically significant stenosis. Limited intracranial: No acute intracranial abnormality identified within the field of view. Visualized orbits: Incompletely imaged. No mass or acute finding at the imaged levels. Mastoids and visualized paranasal sinuses: No significant paranasal sinus disease or mastoid effusion at the imaged levels. Skeleton: Reversal of the expected cervical lordosis. Mild C6-C7 and C7-T1 grade 1 anterolisthesis. Cervical spondylosis. No acute bony abnormality or aggressive osseous lesion identified. Upper chest: Incompletely imaged known large left upper lobe lung mass. IMPRESSION: 6.1 x 2.9 cm ovoid soft tissue focus within the left  upper neck, along the inferior aspect of the left parotid gland. Favored differential considerations are primary parotid neoplasm versus nodal metastatic disease. Direct tissue sampling should be considered. Elsewhere within the neck, no pathologically enlarged lymph nodes are identified. Partially imaged known left upper lobe pulmonary mass suspicious for bronchogenic carcinoma. Atherosclerotic disease as described. Notably, calcified plaque at the origin of the right ICA could result in a hemodynamically significant stenosis. Carotid artery duplex recommended for further evaluation. Cervical spondylosis. Electronically Signed   By: Kellie Simmering DO   On: 11/19/2020 13:25   MR BRAIN W WO CONTRAST  Result Date: 11/20/2020 CLINICAL DATA:  Lung cancer, staging EXAM: MRI HEAD WITHOUT AND WITH CONTRAST TECHNIQUE: Multiplanar, multiecho pulse sequences of the brain and surrounding structures were obtained without and with intravenous contrast. CONTRAST:  24m GADAVIST GADOBUTROL 1 MMOL/ML IV SOLN COMPARISON:  None. FINDINGS: Brain: There is no acute infarction or intracranial hemorrhage. There is no intracranial mass, mass effect, or edema. There is no hydrocephalus or extra-axial fluid collection. Ventricles and sulci are within normal limits in size and configuration. Patchy small foci of T2 hyperintensity in the supratentorial white matter are nonspecific but may reflect minor chronic microvascular ischemic changes. No abnormal enhancement. Vascular: Major vessel flow voids at the skull base are preserved. Skull and upper cervical spine: Normal marrow signal is preserved. Sinuses/Orbits: Paranasal sinuses are aerated. Bilateral lens replacements. Other: Sella is partially empty. Mastoid air cells are clear. Partially imaged left neck mass along the inferior parotid better evaluated on recent neck CT. IMPRESSION: No evidence of intracranial metastatic disease. Electronically Signed   By:  Macy Mis M.D.   On:  11/20/2020 09:00   [place] CORE BIOPSY (SALIVARY GLAND/PAROTID GLAND)  Result Date: 11/20/2020 CLINICAL DATA:  Parotid region mass, left submandibular lump EXAM: ULTRASOUND GUIDED CORE BIOPSY OF LEFT NECK MASS MEDICATIONS: Intravenous Fentanyl 18mg and Versed 127mwere administered as conscious sedation during continuous monitoring of the patient's level of consciousness and physiological / cardiorespiratory status by the radiology RN, with a total moderate sedation time of 20 minutes. PROCEDURE: The procedure, risks, benefits, and alternatives were explained to the patient. Questions regarding the procedure were encouraged and answered. The patient understands and consents to the procedure. Survey ultrasound of the left neck was performed. The complex left neck mass was localized and an appropriate skin entry site was determined and marked. The operative field was prepped with chlorhexidine in a sterile fashion, and a sterile drape was applied covering the operative field. A sterile gown and sterile gloves were used for the procedure. Local anesthesia was provided with 1% Lidocaine. Under real-time ultrasound guidance, a 17 gauge trocar needle was advanced to the margin of the lesion. Once needle tip position was confirmed, coaxial 18-gauge core biopsy samples were obtained, submitted in saline to surgical pathology. The guide needle was removed. Postprocedure scans show no hemorrhage or other apparent complication. The patient tolerated the procedure well. COMPLICATIONS: None. FINDINGS: Complex left neck mass was localized corresponding to CT findings. Representative core biopsy samples obtained as above. IMPRESSION: 1. Technically successful ultrasound-guided core biopsy, left neck mass. Electronically Signed   By: D Lucrezia Europe.D.   On: 11/20/2020 13:47   VAS USK[place]AROTID  Result Date: 11/19/2020 Carotid Arterial Duplex Study Patient Name:  Alex Edwards  Date of Exam:   11/19/2020 Medical Rec #: [id number]       Accession #:    [id number] of Birth: 4/04-Oct-1935       Patient Gender: M Patient Age:   85Y [facility]      VAS US[place]AROTID Referring Phys: [id number]-------------------------------------------------------------------------------  Indications:       Stenosis. Risk Factors:      Hypertension, hyperlipidemia. Comparison Study:  No prior studies. Performing Technologist: GrOliver HumVT  Examination Guidelines: A complete evaluation includes B-mode imaging, spectral Doppler, color Doppler, and power Doppler as needed of all accessible portions of each vessel. Bilateral testing is considered an integral part of a complete examination. Limited examinations for reoccurring indications may be performed as noted.  Right Carotid Findings: +----------+--------+--------+--------+--------------------------+--------+           PSV cm/sEDV cm/sStenosisPlaque Description        Comments +----------+--------+--------+--------+--------------------------+--------+ CCA Prox  99      20              smooth and heterogenous            +----------+--------+--------+--------+--------------------------+--------+ CCA Distal64      15              smooth and heterogenous            +----------+--------+--------+--------+--------------------------+--------+ ICA Prox  453     165     80-99%  irregular and heterogenous         +----------+--------+--------+--------+--------------------------+--------+ ICA Mid   267     91      60-79%                                     +----------+--------+--------+--------+--------------------------+--------+  ICA Distal130     54                                        tortuous +----------+--------+--------+--------+--------------------------+--------+ ECA       233     15                                                 +----------+--------+--------+--------+--------------------------+--------+  +----------+--------+-------+--------+-------------------+           PSV cm/sEDV cmsDescribeArm Pressure (mmHG) +----------+--------+-------+--------+-------------------+ Subclavian213                                        +----------+--------+-------+--------+-------------------+ +---------+--------+--+--------+--+---------+ VertebralPSV cm/s58EDV cm/s14Antegrade +---------+--------+--+--------+--+---------+  Left Carotid Findings: +----------+--------+--------+--------+-----------------------+--------+           PSV cm/sEDV cm/sStenosisPlaque Description     Comments +----------+--------+--------+--------+-----------------------+--------+ CCA Prox  129     26              smooth and heterogenous         +----------+--------+--------+--------+-----------------------+--------+ CCA Distal119     28              smooth and heterogenous         +----------+--------+--------+--------+-----------------------+--------+ ICA Prox  133     46      40-59%  smooth and heterogenous         +----------+--------+--------+--------+-----------------------+--------+ ICA Distal199     42                                     tortuous +----------+--------+--------+--------+-----------------------+--------+ ECA       193     12                                              +----------+--------+--------+--------+-----------------------+--------+ +----------+--------+--------+--------+-------------------+           PSV cm/sEDV cm/sDescribeArm Pressure (mmHG) +----------+--------+--------+--------+-------------------+ [id number]                                         +----------+--------+--------+--------+-------------------+ +---------+--------+--+--------+--+---------+ VertebralPSV cm/s59EDV cm/s15Antegrade +---------+--------+--+--------+--+---------+   Summary: Right Carotid: Velocities in the right ICA are consistent with a 80-99%                 stenosis. Left Carotid: Velocities in the left ICA are consistent with a 40-59% stenosis. Vertebrals: Bilateral vertebral arteries demonstrate antegrade flow. *See table(s) above for measurements and observations.  Electronically signed by Harold Barban MD on 11/19/2020 at 9:21:07 PM.    Final    DG C-ARM BRONCHOSCOPY  Result Date: 11/18/2020 C-ARM BRONCHOSCOPY: Fluoroscopy was utilized by the requesting physician.  No radiographic interpretation.    Scheduled Meds: . insulin aspart  0-9 Units Subcutaneous TID WC  . metoprolol tartrate  50 mg Oral BID  . ofloxacin  1 drop Both Eyes QID  . polyvinyl alcohol  1 drop Left Eye  TID  . sodium chloride flush  3 mL Intravenous Q12H   Continuous Infusions: . azithromycin 500 mg (11/19/20 1645)  . cefTRIAXone (ROCEPHIN)  IV 2 g (11/20/20 1137)     LOS: 3 days   Time spent: 30 minutes   Darliss Cheney, MD [facility]  11/20/2020, 1:52 PM   How to contact the [facility] Attending or Consulting provider [place] or covering provider during after hours [place], for this patient?  1. Check the care team in [facility] and look for a) attending/consulting [facility] provider listed and b) the [facility] team listed. Page or secure chat [id number]. 2. Log into [email or web address] and use [place]'s universal password to access. If you do not have the password, please contact the hospital operator. 3. Locate the [facility] provider you are looking for under [facility] and page to a number that you can be directly reached. 4. If you still have difficulty reaching the provider, please page the [facility] (Director on Call) for the Hospitalists listed on amion for assistance.

## 2020-11-20 NOTE — Progress Notes (Signed)
The patient returned from IR biopsy site is clean dry and intact. Will continue to monitor.

## 2020-11-20 NOTE — Consult Note (Signed)
Hospital Consult    Reason for Consult:  Carotid stenosis Referring Physician:  Dr. Doristine Bosworth MRN #:  [id number]  History of Present Illness: This is a 85 y.o. male here with new diagnosis of non-squamous cell lung cancer stage IIIb.  He was found to have incidental right ICA stenosis.  He denies any history of stroke, TIA or amaurosis.  He has never had vascular or cardiac surgery in the past.  He is a smoker.  He does not take any blood thinners or antiplatelet agents.  He states that he cannot take statin drugs.  Risk factors for vascular disease include smoking, diabetes, hyperlipidemia, hypertension.  Past Medical History:  Diagnosis Date  . BCE (basal cell epithelioma)   . Bruises easily    on hands  . Cough    smokers cough or perfurmes  . Degeneration macular    right eye  . Diabetes mellitus without complication ([place])   . Dyslipidemia   . ED (erectile dysfunction)   . Hx of adenomatous colonic polyps   . Hyperlipidemia   . Hypertension    borderline  . Obesity   . Psoriasis   . Seasonal allergies   . Smoker    former    Past Surgical History:  Procedure Laterality Date  . BRONCHIAL BRUSHINGS  11/18/2020   Procedure: BRONCHIAL BRUSHINGS;  Surgeon: Spero Geralds, MD;  Location: [facility];  Service: Pulmonary;;  . BRONCHIAL NEEDLE ASPIRATION BIOPSY  11/18/2020   Procedure: BRONCHIAL NEEDLE ASPIRATION BIOPSIES;  Surgeon: Spero Geralds, MD;  Location: [facility];  Service: Pulmonary;;  . BRONCHIAL WASHINGS  11/18/2020   Procedure: BRONCHIAL WASHINGS;  Surgeon: Spero Geralds, MD;  Location: [facility];  Service: Pulmonary;;  . CATARACT EXTRACTION, BILATERAL Bilateral 2013  . COLONOSCOPY  2005   [facility]  . ENDOBRONCHIAL ULTRASOUND N/A 11/18/2020   Procedure: ENDOBRONCHIAL ULTRASOUND;  Surgeon: Spero Geralds, MD;  Location: [facility];  Service: Pulmonary;  Laterality: N/A;  . POLYPECTOMY    . SKIN CANCER EXCISION Right 1993   under eye-MOHS, freeze  multiple places freq  . TONSILLECTOMY  as child  . TOTAL HIP ARTHROPLASTY Left 01/04/2014   Procedure: LEFT TOTAL HIP ARTHROPLASTY ANTERIOR APPROACH;  Surgeon: Mcarthur Rossetti, MD;  Location: [facility];  Service: Orthopedics;  Laterality: Left;  . TOTAL HIP ARTHROPLASTY Left 02/14/2014   Procedure: Irrigation and Debridement left hip;  Surgeon: Mcarthur Rossetti, MD;  Location: [facility];  Service: Orthopedics;  Laterality: Left;    Allergies  Allergen Reactions  . Vancomycin     Red Man Syndrome   . Latex Other (See Comments)    "possibly allergic- dry, itching, rash"    Prior to Admission medications   Medication Sig Start Date End Date Taking? Authorizing Provider  Ascorbic Acid (VITAMIN C) 1000 MG tablet Take 1,000 mg by mouth daily.   Yes [provider]  fexofenadine (ALLEGRA) 180 MG tablet Take 180 mg by mouth daily.   Yes [provider]  ketoconazole (NIZORAL) 2 % cream APPLY TO THE AFFECTED AREA ONCE A DAY. Patient taking differently: 1 application daily as needed for irritation. 10/08/20  Yes [name], MD  losartan (COZAAR) 50 MG tablet TAKE ONE TABLET ONCE DAILY Patient taking differently: Take 50 mg by mouth daily. 10/27/20  Yes [name], MD  metoprolol tartrate (LOPRESSOR) 50 MG tablet TAKE ONE TABLET BY MOUTH TWICE DAILY. Patient taking differently: Take 50 mg by mouth 2 (two) times daily. 10/20/20  Yes [name], MD  Multiple Vitamin (MULTI VITAMIN DAILY PO) Take 1 tablet by mouth daily.   Yes [provider]  Multiple Vitamins-Minerals (PRESERVISION AREDS 2) CAPS Take 1 capsule by mouth in the morning and at bedtime.   Yes [provider]  triamcinolone cream (KENALOG) 0.1 % APPLY TO AFFECTED AREA TWICE A DAY. Patient taking differently: Apply 1 application topically 2 (two) times daily as needed (itching). 10/14/20  Yes [name], MD  atorvastatin (LIPITOR) 10 MG tablet Take 1 pill Monday Wednesday and  Friday. Patient not taking: No sig reported 01/24/19   [name], MD  cephALEXin (KEFLEX) 500 MG capsule Take 1 capsule (500 mg total) by mouth 3 (three) times daily. Patient not taking: No sig reported 01/11/20   Tysinger, Camelia Eng, PA-C  [place] Patient is to test one time a day DX: E11.9 07/30/16   [name], MD  [facility] VERIO test strip USE TO TEST BLOOD SUGAR ONCE DAILY. 11/21/19   [name], MD    Social History   Socioeconomic History  . Marital status: Married    Spouse name: Not on file  . Number of children: Not on file  . Years of education: Not on file  . Highest education level: Not on file  Occupational History  . Not on file  Tobacco Use  . Smoking status: Current Some Day Smoker    Packs/day: 1.00    Years: 50.00    Pack years: 50.00    Types: Cigarettes    Last attempt to quit: 04/28/2014    Years since quitting: 6.5  . Smokeless tobacco: Never Used  Substance and Sexual Activity  . Alcohol use: No    Alcohol/week: 0.0 standard drinks  . Drug use: No  . Sexual activity: Yes  Other Topics Concern  . Not on file  Social History Narrative  . Not on file   Social Determinants of Health   Financial Resource Strain: Not on file  Food Insecurity: Not on file  Transportation Needs: Not on file  Physical Activity: Not on file  Stress: Not on file  Social Connections: Not on file  Intimate Partner Violence: Not on file    Family History  Problem Relation Age of Onset  . Heart disease Brother     ROS:  Cardiovascular: []  chest pain/pressure []  palpitations []  SOB lying flat []  DOE []  pain in legs while walking []  pain in legs at rest []  pain in legs at night []  non-healing ulcers []  hx of DVT []  swelling in legs  Pulmonary: []  productive cough []  asthma/wheezing []  home O2  Neurologic: []  weakness in []  arms []  legs []  numbness in []  arms []  legs []  hx of CVA []  mini stroke [] difficulty speaking or  slurred speech []  temporary loss of vision in one eye []  dizziness  Hematologic: [x]  hx of cancer []  bleeding problems []  problems with blood clotting easily  Endocrine:   []  diabetes []  thyroid disease  GI []  vomiting blood []  blood in stool  GU: []  CKD/renal failure []  HD--[]  M/W/F or []  T/T/S []  burning with urination []  blood in urine  Psychiatric: []  anxiety []  depression  Musculoskeletal: []  arthritis []  joint pain  Integumentary: []  rashes []  ulcers  Constitutional: []  fever []  chills   Physical Examination  Vitals:   11/20/20 0536 11/20/20 0711  BP: (!) 161/76 133/72  Pulse: 89 78  Resp: 18 18  Temp:  97.8 F (36.6 C)   SpO2: 94% 94%   Body mass index is 31.4 kg/m.  General:  nad HENT: soft, well circumscribed left neck mass Pulmonary: mildly labored breathing Cardiac:palpable radial pulses, rrr Abdomen: soft, NT/ND, no masses Extremities: warm and well perfused  Neurologic: A&O X 3   CBC    Component Value Date/Time   WBC 18.4 (H) 11/20/2020 0437   RBC 4.16 (L) 11/20/2020 0437   HGB 12.7 (L) 11/20/2020 0437   HGB 13.8 01/29/2020 1157   HCT 41.2 11/20/2020 0437   HCT 42.1 01/29/2020 1157   PLT 417 (H) 11/20/2020 0437   PLT 286 01/29/2020 1157   MCV 99.0 11/20/2020 0437   MCV 94 01/29/2020 1157   MCH 30.5 11/20/2020 0437   MCHC 30.8 11/20/2020 0437   RDW 13.1 11/20/2020 0437   RDW 12.1 01/29/2020 1157   LYMPHSABS 0.8 11/17/2020 1144   LYMPHSABS 2.2 01/29/2020 1157   MONOABS 1.7 (H) 11/17/2020 1144   EOSABS 0.0 11/17/2020 1144   EOSABS 0.2 01/29/2020 1157   BASOSABS 0.0 11/17/2020 1144   BASOSABS 0.0 01/29/2020 1157    BMET    Component Value Date/Time   NA 142 11/20/2020 0437   NA 141 01/29/2020 1157   K 6.1 (H) 11/20/2020 0437   CL 104 11/20/2020 0437   CO2 31 11/20/2020 0437   GLUCOSE 110 (H) 11/20/2020 0437   BUN 29 (H) 11/20/2020 0437   BUN 12 01/29/2020 1157   CREATININE 1.02 11/20/2020 0437   CREATININE 1.11  07/26/2016 0921   CALCIUM 8.6 (L) 11/20/2020 0437   GFRNONAA >60 11/20/2020 0437   GFRAA 84 01/29/2020 1157    COAGS: Lab Results  Component Value Date   INR 1.1 11/17/2020   INR 1.02 02/12/2014   INR 0.92 12/25/2013     Non-Invasive Vascular Imaging:   Right Carotid Findings:  +----------+--------+--------+--------+--------------------------+--------+        PSV cm/sEDV cm/sStenosisPlaque Description      Comments  +----------+--------+--------+--------+--------------------------+--------+   CCA Prox 99   20       smooth and heterogenous          +----------+--------+--------+--------+--------------------------+--------+   CCA Distal64   15       smooth and heterogenous          +----------+--------+--------+--------+--------------------------+--------+   ICA Prox 453   165   80-99% irregular and heterogenous        +----------+--------+--------+--------+--------------------------+--------+   ICA Mid  267   91   60-79%                      +----------+--------+--------+--------+--------------------------+--------+   ICA Distal130   54                      tortuous  +----------+--------+--------+--------+--------------------------+--------+   ECA    233   15                            +----------+--------+--------+--------+--------------------------+--------+    +----------+--------+-------+--------+-------------------+       PSV cm/sEDV cmsDescribeArm Pressure (mmHG)  +----------+--------+-------+--------+-------------------+  Subclavian213                       +----------+--------+-------+--------+-------------------+   +---------+--------+--+--------+--+---------+  VertebralPSV cm/s58EDV cm/s14Antegrade   +---------+--------+--+--------+--+---------+      Left Carotid Findings:  +----------+--------+--------+--------+-----------------------+--------+       PSV cm/sEDV cm/sStenosisPlaque Description   Comments  +----------+--------+--------+--------+-----------------------+--------+  CCA Prox  129   26       smooth and heterogenous      +----------+--------+--------+--------+-----------------------+--------+  CCA Distal119   28       smooth and heterogenous      +----------+--------+--------+--------+-----------------------+--------+  ICA Prox 133   46   40-59% smooth and heterogenous      +----------+--------+--------+--------+-----------------------+--------+  ICA Distal199   42                   tortuous  +----------+--------+--------+--------+-----------------------+--------+  ECA    193   12                         +----------+--------+--------+--------+-----------------------+--------+   +----------+--------+--------+--------+-------------------+       PSV cm/sEDV cm/sDescribeArm Pressure (mmHG)  +----------+--------+--------+--------+-------------------+  [id number]                       +----------+--------+--------+--------+-------------------+   +---------+--------+--+--------+--+---------+  VertebralPSV cm/s59EDV cm/s15Antegrade  +---------+--------+--+--------+--+---------+         Summary:  Right Carotid: Velocities in the right ICA are consistent with a 80-99%         stenosis.   Left Carotid: Velocities in the left ICA are consistent with a 40-59%  stenosis.   Vertebrals: Bilateral vertebral arteries demonstrate antegrade flow.    ASSESSMENT/PLAN: This is a 85 y.o. male here with newly diagnosed lung cancer.  He has asymptomatic high-grade right ICA stenosis.  From that  standpoint patient should be on antiplatelet agent with at least baby aspirin daily.  I have discussed statin use with him but he tells me that he has had difficulty in the past.  We discussed that given his current medical issues he is not a candidate for treatment of an asymptomatic carotid lesion.  I will plan to see him back in 6 months in the office for further evaluation.  Certainly if he has symptoms in the interim this could change the discussion.  He demonstrates good understanding of our discussion and I will have him follow-up in 6 months.   Temperance Kelemen C. Donzetta Matters, MD [facility] Office: [phone] Pager: [phone]

## 2020-11-21 ENCOUNTER — Inpatient Hospital Stay (HOSPITAL_COMMUNITY): Payer: Medicare Other

## 2020-11-21 DIAGNOSIS — R918 Other nonspecific abnormal finding of lung field: Secondary | ICD-10-CM | POA: Diagnosis not present

## 2020-11-21 DIAGNOSIS — J189 Pneumonia, unspecified organism: Secondary | ICD-10-CM | POA: Diagnosis not present

## 2020-11-21 DIAGNOSIS — R590 Localized enlarged lymph nodes: Secondary | ICD-10-CM | POA: Diagnosis not present

## 2020-11-21 DIAGNOSIS — J9601 Acute respiratory failure with hypoxia: Secondary | ICD-10-CM | POA: Diagnosis not present

## 2020-11-21 DIAGNOSIS — I6521 Occlusion and stenosis of right carotid artery: Secondary | ICD-10-CM

## 2020-11-21 LAB — GLUCOSE, CAPILLARY
Glucose-Capillary: 120 mg/dL — ABNORMAL HIGH (ref 70–99)
Glucose-Capillary: 108 mg/dL — ABNORMAL HIGH (ref 70–99)
Glucose-Capillary: 112 mg/dL — ABNORMAL HIGH (ref 70–99)
Glucose-Capillary: 119 mg/dL — ABNORMAL HIGH (ref 70–99)

## 2020-11-21 LAB — CBC
HCT: 41.5 % (ref 39.0–52.0)
Hemoglobin: 12.7 g/dL — ABNORMAL LOW (ref 13.0–17.0)
MCH: 30.1 pg (ref 26.0–34.0)
MCHC: 30.6 g/dL (ref 30.0–36.0)
MCV: 98.3 fL (ref 80.0–100.0)
Platelets: 362 10*3/uL (ref 150–400)
RBC: 4.22 MIL/uL (ref 4.22–5.81)
RDW: 13.1 % (ref 11.5–15.5)
WBC: 14.4 10*3/uL — ABNORMAL HIGH (ref 4.0–10.5)
nRBC: 0 % (ref 0.0–0.2)

## 2020-11-21 LAB — CULTURE, RESPIRATORY W GRAM STAIN: Culture: NO GROWTH

## 2020-11-21 LAB — BASIC METABOLIC PANEL
Anion gap: 11 (ref 5–15)
BUN: 25 mg/dL — ABNORMAL HIGH (ref 8–23)
CO2: 27 mmol/L (ref 22–32)
Calcium: 8.8 mg/dL — ABNORMAL LOW (ref 8.9–10.3)
Chloride: 105 mmol/L (ref 98–111)
Creatinine, Ser: 0.73 mg/dL (ref 0.61–1.24)
GFR, Estimated: >60 mL/min (ref >60)
Glucose, Bld: 122 mg/dL — ABNORMAL HIGH (ref 70–99)
Potassium: 4.4 mmol/L (ref 3.5–5.1)
Sodium: 143 mmol/L (ref 135–145)

## 2020-11-21 LAB — SURGICAL PATHOLOGY

## 2020-11-21 MED ORDER — AZITHROMYCIN 250 MG PO TABS
500.0000 mg | ORAL_TABLET | Freq: Once | ORAL | Status: AC
  Administered 2020-11-21: 500 mg via ORAL
  Filled 2020-11-21: qty 2

## 2020-11-21 NOTE — Progress Notes (Signed)
Patient refuses nocturnal CPAP and request equipment be removed from his room. States he does not wish to use it again and does not use one at home.

## 2020-11-21 NOTE — Progress Notes (Signed)
Physical Therapy Treatment Patient Details Name: Alex Edwards MRN: [id number] DOB: [birth year] Today's Date: 11/21/2020    History of Present Illness 85 yo male admitted with acute respiratory failure, hemoptysis, conjunctivitis, new lung mass on imaging. Hx of L THA 2015, obesity    PT Comments    Pt motivated and up to ambulate increased distance in hall and to bathroom for toileting and hygiene at sink.  Pt demonstrating increased activity tolerance, min dyspnea with activity on 3L and improvement in stability with ambulation.  Pt looking fwd to return home.   Follow Up Recommendations  Home health PT;Supervision for mobility/OOB     Equipment Recommendations  None recommended by PT    Recommendations for Other Services       Precautions / Restrictions Precautions Precautions: Fall Precaution Comments: monitor O2 Restrictions Weight Bearing Restrictions: No    Mobility  Bed Mobility               General bed mobility comments: up in chair and requests back to same    Transfers Overall transfer level: Needs assistance Equipment used: Rolling walker (2 wheeled) Transfers: Sit to/from Stand Sit to Stand: Min guard            Ambulation/Gait Ambulation/Gait assistance: Min guard Gait Distance (Feet): 450 Feet (and 20' from bathroom) Assistive device: Rolling walker (2 wheeled) Gait Pattern/deviations: Step-through pattern;Decreased stride length;Wide base of support     General Gait Details: Guarded gait even with RW use. Improved steadiness as distance increased. Dyspnea 1/4. O2 90-100% on 3L [place].   Stairs             Wheelchair Mobility    Modified Rankin (Stroke Patients Only)       Balance Overall balance assessment: Needs assistance Sitting-balance support: Feet supported Sitting balance-Leahy Scale: Good     Standing balance support: No upper extremity supported Standing balance-Leahy Scale: Fair                               Cognition Arousal/Alertness: Awake/alert Behavior During Therapy: WFL for tasks assessed/performed Overall Cognitive Status: Within Functional Limits for tasks assessed                                        Exercises      General Comments        Pertinent Vitals/Pain Pain Assessment: No/denies pain    Home Living                      Prior Function            PT Goals (current goals can now be found in the care plan section) Acute Rehab PT Goals Patient Stated Goal: home soon! PT Goal Formulation: With patient Time For Goal Achievement: 12/03/20 Potential to Achieve Goals: Good Progress towards PT goals: Progressing toward goals    Frequency    Min 3X/week      PT Plan Current plan remains appropriate    Co-evaluation              AM-PAC PT "6 Clicks" Mobility   Outcome Measure  Help needed turning from your back to your side while in a flat bed without using bedrails?: None Help needed moving from lying on your back to sitting on the side of  a flat bed without using bedrails?: None Help needed moving to and from a bed to a chair (including a wheelchair)?: A Little Help needed standing up from a chair using your arms (e.g., wheelchair or bedside chair)?: A Little Help needed to walk in hospital room?: A Little Help needed climbing 3-5 steps with a railing? : A Little 6 Click Score: 20    End of Session Equipment Utilized During Treatment: Gait belt;Oxygen Activity Tolerance: Patient tolerated treatment well Patient left: in chair;with call bell/phone within reach;with chair alarm set Nurse Communication: Mobility status PT Visit Diagnosis: Difficulty in walking, not elsewhere classified (R26.2);Muscle weakness (generalized) (M62.81);Unsteadiness on feet (R26.81)     Time: 3735-7897 PT Time Calculation (min) (ACUTE ONLY): 45 min  Charges:  $Gait Training: 8-22 mins $Therapeutic Activity: 8-22 mins                      [place] Pager [phone] Office [phone]    Danell Vazquez 11/21/2020, 11:49 AM

## 2020-11-21 NOTE — Progress Notes (Signed)
   NAME:  Alex Edwards, MRN:  [id number], DOB:  1936-03-01, LOS: 4 ADMISSION DATE:  11/17/2020, CONSULTATION DATE:  11/21/2020 REFERRING MD:  Darliss Cheney, MD, CHIEF COMPLAINT:  Lung mass  History of Present Illness:  The patient is an 85 year old gentleman with a history of tobacco use disorder who presents from home with 4 days of worsening shortness of breath, sputum production, and hemoptysis.  He notes that for the past 4 days he has had decreased appetite.  He denies any fevers chills night sweats or weight loss.  He has smoked no more than a pack a day for the last 60 years on and off.  He quit last week when his breathing and coughing got worse.  He does have a daily cough with mild sputum production but has been worse in the last few days.  He does not have a formal diagnosis of COPD.  He came to the ED with the symptoms and had a CT chest which demonstrated left upper lobe mass concerning for primary lung malignancy.  Pulmonary is being consulted to assist with potential bronchoscopy with biopsy of this mass.  Pertinent  Medical History  Hypertension Hyperlipidemia  Significant Hospital Events: Including procedures, antibiotic start and stop dates in addition to other pertinent events   . 5/23 presented to ED admitted to hospital medicine . 5/24 bronchoscopy/EBUS . 5/25 brushing show NSCLC  Interim History / Subjective:  Still having some scant hemoptysis. Breathing feels quite a bit better. Has been weaned to 3L .   Objective   Blood pressure (!) 177/79, pulse 95, temperature 97.8 F (36.6 C), temperature source Oral, resp. rate 20, height 6\' 1"  (1.854 m), weight 108 kg, SpO2 95 %.        Intake/Output Summary (Last 24 hours) at 11/21/2020 1025 Last data filed at 11/21/2020 0544 Gross per 24 hour  Intake 470 ml  Output 1100 ml  Net -630 ml   Filed Weights   11/17/20 1058 11/18/20 1222  Weight: 114 kg 108 kg    Examination: General: Elderly, no respiratory distress,  sitting up in chair HENT: left mandibular swelling, mobile, firm Lungs: Diminished bilaterally, no wheezes or crackles Cardiovascular: Regular rate and rhythm, no murmurs rubs or gallops Abdomen: Obese, soft, non tender, normal bowel sounds Extremities: No edema Neuro: Normal speech, no focal asymmetry   Labs/imaging that I havepersonally reviewed  (right click and "Reselect all SmartList Selections" daily)     Assessment & Plan:   NSCLC - new diagnosis Acute hypoxemic respiratory failure on 4LNC CAP Hemoptysis - expect this will decrease with time.  Tobacco use disorder  Bronchial brushings show NSCLC. 4L lymph node negative for malignancy, 7 lymph node without lymphocytes based on path report.   - PET pending upon discharge - Oncology planning for concurrent chemotherapy and radiation followed by immunotherapy.  - Continue antibiotics, recommend transition to Augmentin or 10 day total course for post-obstructive pneumonia considering hemoptysis.  - Wean oxygen goal sats over 88%. Will likely need oxygen at discharge. - Pulmonary follow-up with Dr. Shearon Stalls scheduled for 6/7 - Please call the office sooner if needed    Georgann Housekeeper, AGACNP-BC [place] for personal pager PCCM on call pager [phone] until 7pm. Please call [place] 7p-7a. [phone]  11/21/2020 10:35 AM

## 2020-11-21 NOTE — Progress Notes (Signed)
Attempting to wean patients oxygen. Pt is at rest in chair on room air oxygen saturation 90-92%. Will continue to monitor.

## 2020-11-21 NOTE — Progress Notes (Signed)
Report received from [facility]. No changes noted from previous assessment. Maintain current plan of care.

## 2020-11-21 NOTE — Progress Notes (Signed)
PROGRESS NOTE    Alex Edwards  ONG:[id number] DOB: Oct 14, 1935 DOA: 11/17/2020 PCP: [name], MD   Brief Narrative:  85 year old male with past medical history of hypertension, hyperlipidemia,basal cell epithelioma,tobacco abuse, type 2 diabeteswho presented to the ED with gradually worsening shortness of breath on exertion and rest,productive cough, hemoptysisand fever for several days. he smokes 1 pack/day of cigarettes intermittently for years and quit last week when his breathing and coughing were worsening.  He met criteria for severe sepsis in the ED with tachycardic, tachypnea, hypoxia (SpO2 86% on room air) placed on4L/min in the setting of likely post-obstructive Pneumonia secondary to a newly discovered left upper lobe lung mass identified on CT chest in the ED.  Patient started on IV antibiotics for CAP and pulmonology consulted and he underwent bronchoscopy and tissue biopsy on 11/18/2020 and pathology showed non-small cell lung carcinoma.  He was also seen by oncology who ended up consulting radiation oncology.  MRI brain was done which showed no metastasis.  Radiation oncology recommends 6 and half week of radiation every day as outpatient.  He was then found to have a mobile and nontender mass at the left neck/jaw angle.  Per patient, this has been there for more than 35 years and his PCP is aware of that.  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  IR was consulted and he underwent CT-guided biopsy of that.  He was incidentally found to have high-grade right internal carotid stenosis 80 to 99% for which vascular surgery was consulted and since this was asymptomatic and he was going through new diagnosis of cancer, they recommended watchful waiting and follow-up with vascular surgery as outpatient.  They also recommended antiplatelet, at least low-dose aspirin and statin however patient declined to use a statin having the  past experience of body aches with all sort of statins.  Patient completed 5 days of Rocephin and Zithromax.  Assessment & Plan:   Active Problems:   Acute respiratory failure with hypoxia (HCC)   Severe sepsis (HCC)   CAP (community acquired pneumonia)   Lung mass   Conjunctivitis   Acute respiratory failure with hypoxia and and severe sepsis secondary to CAP, postobstructive pneumonia in setting of Left upper lobe lung mass with lymphadenopathy: Large mass concerning for lung malignancy.  Status post bronchoscopy and biopsy and pathology shows NSCLC.  Pulmonology wants another bronchoscopy and biopsy and they will do this as outpatient.  Oncology on board.  MRI brain negative for any metastasis.  PET scan is ordered for outpatient.  Patient is still on 4 L of oxygen at rest but feels comfortable without shortness of breath.  No more rhonchi on exam.  He will receive last doses of Rocephin and Zithromax today.  We will try to wean oxygen down.  Will obtain chest x-ray.  Wonder if there is any pulmonary edema.  We will also check transthoracic echo.  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  According to him, the mass has been there for 30+ years and his PCP knows about it and has not changed in any form.  CT soft tissue neck was done which shows possibility of primary mandibular cancer versus metastasis from lung cancer.  IR was consulted and patient underwent ultrasound-guided core biopsy of the mandible mass today on 11/20/2020.  Pathology report pending.  Right ICA stenosis: CT soft tissue neck found  out incidental finding of right ICA stenosis.  Doppler carotid confirmed that right ICA is stenosed 80 to 99%.  Seen by Dr. Vella Redhead of vascular surgery and since this is asymptomatic, he has recommended outpatient follow-up in 6 months.  He has recommended at least low-dose aspirin however due to recent history of  hemoptysis, I am holding off to that.  He does not have any hemoptysis today though.  Left eye conjunctivitis: Resolved.  AKI: Resolved.  Erronous hyperkalemia: BMP showed potassium of 6.1 today which was repeated and potassium is normal.  Hypertension: Slightly elevated.  Continue Lopressor and resume losartan.  Type 2 diabetes: Blood sugar controlled.  Continue SSI.  Hyperlipidemia -not on statin for some reason due to previously having side effects of muscle aches.  Despite of counseling, he is not willing to try them again.  Tobacco use disorder -1 PPD smoking history, quit last week due to symptoms outlined above.  Obesity: Body mass index is 33.16 kg/m.  Complicates overall care and prognosis.    DVT prophylaxis: SCDs Start: 11/17/20 1454   Code Status: Full Code  Family Communication:  None present at bedside.  Plan of care discussed with patient in length and he verbalized understanding and agreed with it.  Status is: Inpatient  Remains inpatient appropriate because:Inpatient level of care appropriate due to severity of illness   Dispo: The patient is from: Home              Anticipated d/c is to: Home              Patient currently is not medically stable to d/c.   Difficult to place patient No        Estimated body mass index is 31.4 kg/m as calculated from the following:   Height as of this encounter: 6' 1"  (1.854 m).   Weight as of this encounter: 108 kg.      Nutritional status:               Consultants:   Oncology  PCCM  IR  Vascular surgery  Procedures:   Bronchoscopy  Antimicrobials:  Anti-infectives (From admission, onward)   Start     Dose/Rate Route Frequency Ordered Stop   11/17/20 1145  cefTRIAXone (ROCEPHIN) 2 g in sodium chloride 0.9 % 100 mL IVPB        2 g 200 mL/hr over 30 Minutes Intravenous Every 24 hours 11/17/20 1130 11/22/20 1144   11/17/20 1145  azithromycin (ZITHROMAX) 500 mg in sodium chloride 0.9  % 250 mL IVPB        500 mg 250 mL/hr over 60 Minutes Intravenous Every 24 hours 11/17/20 1130 11/22/20 1759         Subjective: Seen and examined.  Despite of being on 4 L, he denies any shortness of breath and looks comfortable.  No other complaint.  He tells me that he would want to go home.  He also brought up the conversation about signing [place] if he were not let go today.  I told him that I highly discourage leaving [place] as he is not medically ready yet and the chances are that he will end up coming back to the emergency department.  He is willing to stay overnight but he has made it clear that if he were not to be discharged tomorrow, he will sign AMA.  Patient's primary RN was present at the bedside during encounter  Objective: Vitals:   11/20/20 1318  11/20/20 2012 11/21/20 0432 11/21/20 0946  BP: (!) 160/77 (!) 178/82 (!) 163/69 (!) 177/79  Pulse: 78 90 85 95  Resp: 20 18 20 20   Temp: 98 F (36.7 C) 98 F (36.7 C) 97.8 F (36.6 C)   TempSrc: Oral Oral Oral   SpO2: 99% 95% 94% 95%  Weight:      Height:        Intake/Output Summary (Last 24 hours) at 11/21/2020 1014 Last data filed at 11/21/2020 0544 Gross per 24 hour  Intake 470 ml  Output 1100 ml  Net -630 ml   Filed Weights   11/17/20 1058 11/18/20 1222  Weight: 114 kg 108 kg    Examination:  General exam: Appears calm and comfortable, morbidly obese Respiratory system: Clear but slightly diminished to auscultation. Respiratory effort normal. Cardiovascular system: S1 & S2 heard, RRR. No JVD, murmurs, rubs, gallops or clicks.  Trace pitting edema bilateral lower extremity. Gastrointestinal system: Abdomen is nondistended, soft and nontender. No organomegaly or masses felt. Normal bowel sounds heard. Central nervous system: Alert and oriented. No focal neurological deficits. Extremities: Symmetric 5 x 5 power. Skin: No rashes, lesions or ulcers.  Psychiatry: Judgement and  insight appear normal. Mood & affect appropriate.    Data Reviewed: I have personally reviewed following labs and imaging studies  CBC: Recent Labs  Lab 11/17/20 1144 11/18/20 0443 11/19/20 0157 11/20/20 0437 11/21/20 0511  WBC 18.7* 17.8* 15.9* 18.4* 14.4*  NEUTROABS 16.1*  --   --   --   --   HGB 12.8* 11.9* 12.5* 12.7* 12.7*  HCT 40.3 38.3* 40.5 41.2 41.5  MCV 95.3 98.0 99.0 99.0 98.3  PLT 352 343 337 417* 528   Basic Metabolic Panel: Recent Labs  Lab 11/17/20 1144 11/18/20 0443 11/19/20 0157 11/20/20 0437 11/20/20 1010 11/21/20 0511  NA 136 138 139 142  --  143  K 4.5 4.6 5.1 6.1* 4.3 4.4  CL 103 102 103 104  --  105  CO2 26 30 26 31   --  27  GLUCOSE 221* 135* 217* 110*  --  122*  BUN 24* 19 33* 29*  --  25*  CREATININE 1.34* 1.04 0.96 1.02  --  0.73  CALCIUM 8.3* 8.3* 8.5* 8.6*  --  8.8*   GFR: Estimated Creatinine Clearance: 90.2 mL/min (by C-G formula based on SCr of 0.73 mg/dL). Liver Function Tests: Recent Labs  Lab 11/17/20 1144  AST 25  ALT 18  ALKPHOS 70  BILITOT 0.5  PROT 7.3  ALBUMIN 3.2*   No results for input(s): LIPASE, AMYLASE in the last 168 hours. No results for input(s): AMMONIA in the last 168 hours. Coagulation Profile: Recent Labs  Lab 11/17/20 1144  INR 1.1   Cardiac Enzymes: No results for input(s): CKTOTAL, CKMB, CKMBINDEX, TROPONINI in the last 168 hours. BNP (last 3 results) No results for input(s): PROBNP in the last 8760 hours. HbA1C: No results for input(s): HGBA1C in the last 72 hours. CBG: Recent Labs  Lab 11/20/20 0739 11/20/20 1118 11/20/20 1611 11/20/20 2012 11/21/20 0744  GLUCAP 108* 120* 182* 95 120*   Lipid Profile: No results for input(s): CHOL, HDL, LDLCALC, TRIG, CHOLHDL, LDLDIRECT in the last 72 hours. Thyroid Function Tests: No results for input(s): TSH, T4TOTAL, FREET4, T3FREE, THYROIDAB in the last 72 hours. Anemia Panel: No results for input(s): VITAMINB12, FOLATE, FERRITIN, TIBC, IRON,  RETICCTPCT in the last 72 hours. Sepsis Labs: Recent Labs  Lab 11/17/20 1144 11/17/20 1330  LATICACIDVEN 2.6*  2.0*    Recent Results (from the past 240 hour(s))  Resp Panel by RT-PCR (Flu A&B, Covid) Nasopharyngeal Swab     Status: None   Collection Time: 11/17/20 11:16 AM   Specimen: Nasopharyngeal Swab; Nasopharyngeal(NP) swabs in vial transport medium  Result Value Ref Range Status   SARS Coronavirus 2 by RT PCR NEGATIVE NEGATIVE Final    Comment: (NOTE) SARS-CoV-2 target nucleic acids are NOT DETECTED.  The SARS-CoV-2 RNA is generally detectable in upper respiratory specimens during the acute phase of infection. The lowest concentration of SARS-CoV-2 viral copies this assay can detect is 138 copies/mL. A negative result does not preclude SARS-Cov-2 infection and should not be used as the sole basis for treatment or other patient management decisions. A negative result may occur with  improper specimen collection/handling, submission of specimen other than nasopharyngeal swab, presence of viral mutation(s) within the areas targeted by this assay, and inadequate number of viral copies(<138 copies/mL). A negative result must be combined with clinical observations, patient history, and epidemiological information. The expected result is Negative.  Fact Sheet for Patients:  EntrepreneurPulse.com.au  Fact Sheet for Healthcare Providers:  IncredibleEmployment.be  This test is no t yet approved or cleared by the [place] FDA and  has been authorized for detection and/or diagnosis of SARS-CoV-2 by FDA under an Emergency Use Authorization (EUA). This EUA will remain  in effect (meaning this test can be used) for the duration of the COVID-19 declaration under Section 564(b)(1) of the Act, 21 U.S.C.section 360bbb-3(b)(1), unless the authorization is terminated  or revoked sooner.       Influenza A by PCR NEGATIVE NEGATIVE Final    Influenza B by PCR NEGATIVE NEGATIVE Final    Comment: (NOTE) The Xpert Xpress SARS-CoV-2/FLU/RSV plus assay is intended as an aid in the diagnosis of influenza from Nasopharyngeal swab specimens and should not be used as a sole basis for treatment. Nasal washings and aspirates are unacceptable for Xpert Xpress SARS-CoV-2/FLU/RSV testing.  Fact Sheet for Patients: EntrepreneurPulse.com.au  Fact Sheet for Healthcare Providers: IncredibleEmployment.be  This test is not yet approved or cleared by the [place] FDA and has been authorized for detection and/or diagnosis of SARS-CoV-2 by FDA under an Emergency Use Authorization (EUA). This EUA will remain in effect (meaning this test can be used) for the duration of the COVID-19 declaration under Section 564(b)(1) of the Act, 21 U.S.C. section 360bbb-3(b)(1), unless the authorization is terminated or revoked.  Performed at [facility], [place] [address] [place], [place] [address]   Blood Culture (routine x 2)     Status: None (Preliminary result)   Collection Time: 11/17/20 11:30 AM   Specimen: BLOOD RIGHT ARM  Result Value Ref Range Status   Specimen Description   Final    BLOOD RIGHT ARM Performed at [place] [address] [place], [place] [address]    Special Requests   Final    BOTTLES DRAWN AEROBIC AND ANAEROBIC Blood Culture adequate volume Performed at [place] [address], [place], [place] [address]    Culture   Final    NO GROWTH 4 DAYS Performed at [facility], [place] [address] [place], [place] [address]    Report Status PENDING  Incomplete  Blood Culture (routine x 2)     Status: None (Preliminary result)   Collection Time: 11/17/20 11:35 AM   Specimen: BLOOD  Result Value Ref Range Status   Specimen Description  Final    BLOOD LEFT ANTECUBITAL Performed at [place]  [address], [place], [place] [address]    Special Requests   Final    BOTTLES DRAWN AEROBIC AND ANAEROBIC Blood Culture adequate volume Performed at [place] [address] [place], [place] [address]    Culture   Final    NO GROWTH 4 DAYS Performed at [facility], [place] [address] [place], [place] [address]    Report Status PENDING  Incomplete  Urine culture     Status: None   Collection Time: 11/17/20  4:45 PM   Specimen: In/Out Cath Urine  Result Value Ref Range Status   Specimen Description   Final    IN/OUT CATH URINE Performed at [place] [address] [place], [place] [address]    Special Requests   Final    NONE Performed at [facility], [place] [address], [place], [place] [address]    Culture   Final    NO GROWTH Performed at [facility], [place] [address] [place], [place] [address]    Report Status 11/19/2020 FINAL  Final  MRSA PCR Screening     Status: None   Collection Time: 11/17/20  5:35 PM   Specimen: Nasopharyngeal  Result Value Ref Range Status   MRSA by PCR NEGATIVE NEGATIVE Final    Comment:        The GeneXpert MRSA Assay (FDA approved for NASAL specimens only), is one component of a comprehensive MRSA colonization surveillance program. It is not intended to diagnose MRSA infection nor to guide or monitor treatment for MRSA infections. Performed at [facility], [place] [address] [place], [place] [address]   Culture, Respiratory w Gram Stain     Status: None   Collection Time: 11/18/20  3:15 PM   Specimen: Bronchial Alveolar Lavage; Respiratory  Result Value Ref Range Status   Specimen Description   Final    BRONCHIAL ALVEOLAR LAVAGE LUL Performed at [place] [address], [place], [place] [address]    Special Requests   Final    NONE Performed at [facility], [place] [address], [place], [place] [address]     Gram Stain   Final    ABUNDANT WBC PRESENT, PREDOMINANTLY PMN NO ORGANISMS SEEN    Culture   Final    NO GROWTH 2 DAYS Performed at [facility], [place] [address], [place],  [address]    Report Status 11/21/2020 FINAL  Final      Radiology Studies: CT SOFT TISSUE NECK WO CONTRAST  Result Date: 11/19/2020 CLINICAL DATA:  Parotid region mass. Additional history provided: Patient reports lump under left jaw for 30+ years. EXAM: CT NECK WITHOUT CONTRAST TECHNIQUE: Multidetector CT imaging of the neck was performed following the standard protocol without intravenous contrast. COMPARISON:  Chest CT 11/17/2020. FINDINGS: Mildly motion degraded exam. Pharynx and larynx: No appreciable swelling or discrete mass within the oral cavity, pharynx or larynx on this noncontrast examination. Salivary glands: 6.1 x 2.9 cm ovoid slightly heterogeneous soft tissue focus along the inferior aspect of the left parotid gland, extending to the left level 2 station and overlying the anterior aspect of the left sternocleidomastoid muscle. The right parotid and bilateral submandibular glands are unremarkable. Thyroid: Unremarkable. Lymph nodes: Please refer to salivary gland findings above. Elsewhere within the neck, no pathologically enlarged cervical chain lymph nodes are identified. Vascular:  There is limited assessment of the major vascular structures of the neck in the absence of intravenous contrast. Calcified atherosclerotic plaque within the visualized aortic arch, proximal major branch vessels of the neck and carotid arteries. Calcified plaque within the proximal right ICA could result in a hemodynamically significant stenosis. Limited intracranial: No acute intracranial abnormality identified within the field of view. Visualized orbits: Incompletely imaged. No mass or acute finding at the imaged levels. Mastoids and visualized paranasal sinuses: No significant paranasal sinus disease or mastoid effusion at  the imaged levels. Skeleton: Reversal of the expected cervical lordosis. Mild C6-C7 and C7-T1 grade 1 anterolisthesis. Cervical spondylosis. No acute bony abnormality or aggressive osseous lesion identified. Upper chest: Incompletely imaged known large left upper lobe lung mass. IMPRESSION: 6.1 x 2.9 cm ovoid soft tissue focus within the left upper neck, along the inferior aspect of the left parotid gland. Favored differential considerations are primary parotid neoplasm versus nodal metastatic disease. Direct tissue sampling should be considered. Elsewhere within the neck, no pathologically enlarged lymph nodes are identified. Partially imaged known left upper lobe pulmonary mass suspicious for bronchogenic carcinoma. Atherosclerotic disease as described. Notably, calcified plaque at the origin of the right ICA could result in a hemodynamically significant stenosis. Carotid artery duplex recommended for further evaluation. Cervical spondylosis. Electronically Signed   By: Kellie Simmering DO   On: 11/19/2020 13:25   MR BRAIN W WO CONTRAST  Result Date: 11/20/2020 CLINICAL DATA:  Lung cancer, staging EXAM: MRI HEAD WITHOUT AND WITH CONTRAST TECHNIQUE: Multiplanar, multiecho pulse sequences of the brain and surrounding structures were obtained without and with intravenous contrast. CONTRAST:  575m GADAVIST GADOBUTROL 1 MMOL/ML IV SOLN COMPARISON:  None. FINDINGS: Brain: There is no acute infarction or intracranial hemorrhage. There is no intracranial mass, mass effect, or edema. There is no hydrocephalus or extra-axial fluid collection. Ventricles and sulci are within normal limits in size and configuration. Patchy small foci of T2 hyperintensity in the supratentorial white matter are nonspecific but may reflect minor chronic microvascular ischemic changes. No abnormal enhancement. Vascular: Major vessel flow voids at the skull base are preserved. Skull and upper cervical spine: Normal marrow signal is preserved.  Sinuses/Orbits: Paranasal sinuses are aerated. Bilateral lens replacements. Other: Sella is partially empty. Mastoid air cells are clear. Partially imaged left neck mass along the inferior parotid better evaluated on recent neck CT. IMPRESSION: No evidence of intracranial metastatic disease. Electronically Signed   By: PMacy MisM.D.   On: 11/20/2020 09:00   [place] BIOPSY (SALIVARY GLAND/PAROTID GLAND)  Result Date: 11/20/2020 CLINICAL DATA:  Parotid region mass, left submandibular lump EXAM: ULTRASOUND GUIDED CORE BIOPSY OF LEFT NECK MASS MEDICATIONS: Intravenous Fentanyl 572m and Versed 75m68mere administered as conscious sedation during continuous monitoring of the patient's level of consciousness and physiological / cardiorespiratory status by the radiology RN, with a total moderate sedation time of 20 minutes. PROCEDURE: The procedure, risks, benefits, and alternatives were explained to the patient. Questions regarding the procedure were encouraged and answered. The patient understands and consents to the procedure. Survey ultrasound of the left neck was performed. The complex left neck mass was localized and an appropriate skin entry site was determined and marked. The operative field was prepped with chlorhexidine in a sterile fashion, and a sterile drape was applied covering the operative field. A sterile gown and sterile gloves were used for the procedure. Local anesthesia was provided with 1% Lidocaine. Under real-time ultrasound guidance, a 17 gauge trocar needle was advanced  to the margin of the lesion. Once needle tip position was confirmed, coaxial 18-gauge core biopsy samples were obtained, submitted in saline to surgical pathology. The guide needle was removed. Postprocedure scans show no hemorrhage or other apparent complication. The patient tolerated the procedure well. COMPLICATIONS: None. FINDINGS: Complex left neck mass was localized corresponding to CT findings. Representative core  biopsy samples obtained as above. IMPRESSION: 1. Technically successful ultrasound-guided core biopsy, left neck mass. Electronically Signed   By: Lucrezia Europe M.D.   On: 11/20/2020 13:47   VAS US CAROTID  Result Date: 11/19/2020 Carotid Arterial Duplex Study Patient Name:  Alex Edwards  Date of Exam:   11/19/2020 Medical Rec #: [id number]        Accession #:    [id number] Date of Birth: 10/19/1935         Patient Gender: M Patient Age:   85Y Exam Location[facility]  [facility] Procedure:      VAS US CAROTID Referring Phys: [id number] Gill[place] --------------------------------------------------------------------------------  Indications:       Stenosis. Risk Factors:      Hypertension, hyperlipidemia. Comparison Study:  No prior studies. Performing Technologist: Oliver Hum RVT  Examination Guidelines: A complete evaluation includes B-mode imaging, spectral Doppler, color Doppler, and power Doppler as needed of all accessible portions of each vessel. Bilateral testing is considered an integral part of a complete examination. Limited examinations for reoccurring indications may be performed as noted.  Right Carotid Findings: +----------+--------+--------+--------+--------------------------+--------+           PSV cm/sEDV cm/sStenosisPlaque Description        Comments +----------+--------+--------+--------+--------------------------+--------+ CCA Prox  99      20              smooth and heterogenous            +----------+--------+--------+--------+--------------------------+--------+ CCA Distal64      15              smooth and heterogenous            +----------+--------+--------+--------+--------------------------+--------+ ICA Prox  453     165     80-99%  irregular and heterogenous         +----------+--------+--------+--------+--------------------------+--------+ ICA Mid   267     91      60-79%                                      +----------+--------+--------+--------+--------------------------+--------+ ICA Distal130     54                                        tortuous +----------+--------+--------+--------+--------------------------+--------+ ECA       233     15                                                 +----------+--------+--------+--------+--------------------------+--------+ +----------+--------+-------+--------+-------------------+           PSV cm/sEDV cmsDescribeArm Pressure (mmHG) +----------+--------+-------+--------+-------------------+ Subclavian213                                        +----------+--------+-------+--------+-------------------+ +---------+--------+--+--------+--+---------+  VertebralPSV cm/s58EDV cm/s14Antegrade +---------+--------+--+--------+--+---------+  Left Carotid Findings: +----------+--------+--------+--------+-----------------------+--------+           PSV cm/sEDV cm/sStenosisPlaque Description     Comments +----------+--------+--------+--------+-----------------------+--------+ CCA Prox  129     26              smooth and heterogenous         +----------+--------+--------+--------+-----------------------+--------+ CCA Distal119     28              smooth and heterogenous         +----------+--------+--------+--------+-----------------------+--------+ ICA Prox  133     46      40-59%  smooth and heterogenous         +----------+--------+--------+--------+-----------------------+--------+ ICA Distal199     42                                     tortuous +----------+--------+--------+--------+-----------------------+--------+ ECA       193     12                                              +----------+--------+--------+--------+-----------------------+--------+ +----------+--------+--------+--------+-------------------+           PSV cm/sEDV cm/sDescribeArm Pressure (mmHG)  +----------+--------+--------+--------+-------------------+ [id number]                                         +----------+--------+--------+--------+-------------------+ +---------+--------+--+--------+--+---------+ VertebralPSV cm/s59EDV cm/s15Antegrade +---------+--------+--+--------+--+---------+   Summary: Right Carotid: Velocities in the right ICA are consistent with a 80-99%                stenosis. Left Carotid: Velocities in the left ICA are consistent with a 40-59% stenosis. Vertebrals: Bilateral vertebral arteries demonstrate antegrade flow. *See table(s) above for measurements and observations.  Electronically signed by Harold Barban MD on 11/19/2020 at 9:21:07 PM.    Final     Scheduled Meds: . insulin aspart  0-9 Units Subcutaneous TID WC  . losartan  50 mg Oral Daily  . metoprolol tartrate  50 mg Oral BID  . ofloxacin  1 drop Both Eyes QID  . polyvinyl alcohol  1 drop Left Eye TID  . sodium chloride flush  3 mL Intravenous Q12H   Continuous Infusions: . azithromycin 500 mg (11/20/20 1734)  . cefTRIAXone (ROCEPHIN)  IV 2 g (11/21/20 0950)     LOS: 4 days   Time spent: 31 minutes   Darliss Cheney, MD [facility]  11/21/2020, 10:14 AM   How to contact the [facility] Attending or Consulting provider [place] or covering provider during after hours [place], for this patient?  1. Check the care team in [facility] and look for a) attending/consulting TR[facility] provider listed and b) the [facility] team listed. Page or secure chat [phone]. 2. Log into [email or web address] and use [place] universal password to access. If you do not have the password, please contact the hospital operator. 3. Locate the [facility] provider you are looking for under [facility] and page to a number that you can be directly reached. 4. If you still have difficulty reaching the provider, please page the [facility] (Director on Call) for the Hospitalists listed on amion for assistance.

## 2020-11-21 NOTE — TOC Progression Note (Signed)
Transition of Care [facility]) - Progression Note    Patient Details  Name: Alex Edwards MRN: [id number] Date of Birth: 17-Apr-1936  Transition of Care [facility]) CM/SW Contact  Eleazar Kimmey, Juliann Pulse, RN Phone Number: 11/21/2020, 4:19 PM  Clinical Narrative:  Patient accepted [name] for HHPT-rep [facility] following.          Expected Discharge Plan and Services                                                 Social Determinants of Health (SDOH) Interventions    Readmission Risk Interventions No flowsheet data found.

## 2020-11-22 ENCOUNTER — Inpatient Hospital Stay (HOSPITAL_COMMUNITY): Payer: Medicare Other

## 2020-11-22 DIAGNOSIS — D119 Benign neoplasm of major salivary gland, unspecified: Secondary | ICD-10-CM

## 2020-11-22 DIAGNOSIS — J9601 Acute respiratory failure with hypoxia: Secondary | ICD-10-CM | POA: Diagnosis not present

## 2020-11-22 LAB — CBC
HCT: 39.3 % (ref 39.0–52.0)
Hemoglobin: 12.5 g/dL — ABNORMAL LOW (ref 13.0–17.0)
MCH: 30.5 pg (ref 26.0–34.0)
MCHC: 31.8 g/dL (ref 30.0–36.0)
MCV: 95.9 fL (ref 80.0–100.0)
Platelets: 380 10*3/uL (ref 150–400)
RBC: 4.1 MIL/uL — ABNORMAL LOW (ref 4.22–5.81)
RDW: 13 % (ref 11.5–15.5)
WBC: 12.7 10*3/uL — ABNORMAL HIGH (ref 4.0–10.5)
nRBC: 0 % (ref 0.0–0.2)

## 2020-11-22 LAB — BASIC METABOLIC PANEL
Anion gap: 6 (ref 5–15)
BUN: 19 mg/dL (ref 8–23)
CO2: 33 mmol/L — ABNORMAL HIGH (ref 22–32)
Calcium: 8.9 mg/dL (ref 8.9–10.3)
Chloride: 105 mmol/L (ref 98–111)
Creatinine, Ser: 0.75 mg/dL (ref 0.61–1.24)
GFR, Estimated: >60 mL/min (ref >60)
Glucose, Bld: 123 mg/dL — ABNORMAL HIGH (ref 70–99)
Potassium: 4.2 mmol/L (ref 3.5–5.1)
Sodium: 144 mmol/L (ref 135–145)

## 2020-11-22 LAB — CULTURE, BLOOD (ROUTINE X 2)
Culture: NO GROWTH
Culture: NO GROWTH
Special Requests: ADEQUATE
Special Requests: ADEQUATE

## 2020-11-22 LAB — GLUCOSE, CAPILLARY
Glucose-Capillary: 119 mg/dL — ABNORMAL HIGH (ref 70–99)
Glucose-Capillary: 124 mg/dL — ABNORMAL HIGH (ref 70–99)

## 2020-11-22 MED ORDER — TAMSULOSIN HCL 0.4 MG PO CAPS: 0.4000 mg | ORAL_CAPSULE | Freq: Every day | ORAL | 0 refills | Status: DC

## 2020-11-22 MED ORDER — SODIUM CHLORIDE 0.9 % IV SOLN
2.0000 g | INTRAVENOUS | Status: DC
  Administered 2020-11-22: 2 g via INTRAVENOUS
  Filled 2020-11-22: qty 2

## 2020-11-22 MED ORDER — LOSARTAN POTASSIUM 100 MG PO TABS: 100.0000 mg | ORAL_TABLET | Freq: Every day | ORAL | 0 refills | Status: DC

## 2020-11-22 MED ORDER — AZITHROMYCIN 250 MG PO TABS: 500.0000 mg | ORAL_TABLET | Freq: Once | ORAL | Status: DC

## 2020-11-22 MED ORDER — AMLODIPINE BESYLATE 5 MG PO TABS: 5.0000 mg | ORAL_TABLET | Freq: Every day | ORAL | 0 refills | Status: DC

## 2020-11-22 MED ORDER — AMOXICILLIN-POT CLAVULANATE 875-125 MG PO TABS: 1.0000 | ORAL_TABLET | Freq: Two times a day (BID) | ORAL | 0 refills | Status: AC

## 2020-11-22 NOTE — Discharge Instructions (Signed)
Lung Cancer Lung cancer is an abnormal growth of cancerous cells that forms a mass (malignant tumor) in a lung. There are several types of lung cancer. The types are based on the appearance of the tumor cells. The two most common types are:  Non-small cell lung cancer. This type of lung cancer is the most common type. Non-small cell lung cancers include squamous cell carcinoma, adenocarcinoma, and large cell carcinoma.  Small cell lung cancer. In this type of lung cancer, abnormal cells are smaller than those of non-small cell lung cancer. Small cell lung cancer gets worse (progresses) faster than non-small cell lung cancer. What are the causes? The most common cause of lung cancer is smoking tobacco. The second most common cause is exposure to a chemical called radon. What increases the risk? You are more likely to develop this condition if:  You smoke tobacco.  You have been exposed to: ? Secondhand tobacco smoke. ? Radon gas. ? Uranium. ? Asbestos. ? Arsenic in drinking water. ? Air pollution.  You have a family or personal history of lung cancer.  You have had lung radiation therapy in the past.  You are older than age 46. What are the signs or symptoms? In the early stages, you may not have any symptoms. As the cancer progresses, symptoms may include:  A lasting cough, possibly with blood.  Fatigue.  Unexplained weight loss.  Shortness of breath.  Loud breathing (wheezing).  Chest pain.  Loss of appetite. Symptoms of advanced lung cancer include:  Hoarseness.  Bone or joint pain.  Weakness.  Change in the structure of the fingernails (clubbing), so that the nail looks like an upside-down spoon.  Swelling of the face or arms.  Inability to move the face (paralysis).  Drooping eyelids. How is this diagnosed? This condition may be diagnosed based on:  Your symptoms and medical history.  A physical exam.  A chest X-ray.  A CT scan.  Blood  tests.  Sputum tests.  Removal of a sample of lung tissue (lung biopsy) for testing. Your cancer will be assessed (staged) to determine how severe it is and how much it has spread (metastasized). How is this treated? Treatment depends on the type and stage of your cancer. Treatment may include one or more of the following:  Surgery to remove as much of the cancer as possible. Lymph nodes in the area may be removed and tested for cancer as well.  Medicines that kill cancer cells (chemotherapy).  High-energy rays that kill cancer cells (radiation therapy).  Targeted therapy. This targets specific parts of cancer cells and the area around them to block the growth and spread of the cancer. Targeted therapy can help limit the damage to healthy cells. Follow these instructions at home: Eating and drinking  Some of your treatments might affect your appetite. If you are having problems eating, or if you do not have an appetite, meet with a dietitian.  If you have side effects that affect your appetite, it may help to: ? Eat smaller meals and snacks often. ? Drink high-nutrition and high-calorie shakes or supplements. ? Eat bland and soft foods that are easy to eat. ? Avoid eating foods that are hot, spicy, or hard to swallow. General instructions  Do not use any products that contain nicotine or tobacco, such as cigarettes and e-cigarettes. If you need help quitting, ask your health care provider.  Do not drink alcohol.  If you are admitted to the hospital, make sure your  cancer specialist (oncologist) is aware. Your cancer may affect your treatment for other conditions.  Take over-the-counter and prescription medicines only as told by your health care provider.  Consider joining a support group for people who have been diagnosed with lung cancer.  Work with your health care provider to manage any side effects of treatment.  Keep all follow-up visits as told by your health care  provider. This is important.   Where to find more information  American Cancer Society: https://www.cancer.[place]): https://www.cancer.gov Contact a health care provider if you:  Lose weight without trying.  Have a persistent cough and wheezing.  Feel short of breath.  Get tired easily.  Have bone or joint pain.  Have difficulty swallowing.  Notice that your voice is changing or getting hoarse.  Have pain that does not get better with medicine. Get help right away if you:  Cough up blood.  Have new breathing problems.  Have chest pain.  Have a fever.  Have swelling in an ankle, leg, or arm, or the face or neck.  Have paralysis in your face.  Are very confused.  Have a drooping eyelid. Summary  Lung cancer is an abnormal growth of cancerous cells that forms a mass (malignant tumor) in a lung.  There are several types of lung cancer. The types are based on the appearance of the tumor cells. The two most common types are non-small cell and small cell.  The most common cause of lung cancer is smoking tobacco.  Early symptoms include a lasting cough, possibly with blood, and fatigue, unexplained weight loss, and shortness of breath.  After diagnosis, treatment depends on the type and stage of your cancer. This information is not intended to replace advice given to you by your health care provider. Make sure you discuss any questions you have with your health care provider. Document Revised: 02/14/2020 Document Reviewed: 02/14/2020 Elsevier Patient Education  2021 [employer].

## 2020-11-22 NOTE — Progress Notes (Signed)
SATURATION QUALIFICATIONS: (This note is used to comply with regulatory documentation for home oxygen)  Patient Saturations on Room Air at Rest = 92 %  Patient Saturations on Room Air while Ambulating = 88%  Patient Saturations on 2  Liters of oxygen while Ambulating = 96%  Please briefly explain why patient needs home oxygen: Pt was able to tolerate activity well. He denied feeling sob at the onset of activity. After walking 50 feet oxygen saturation decreased 88% on room air. 2 liters applied sat increased 96%.

## 2020-11-22 NOTE — Discharge Summary (Signed)
Physician Discharge Summary  Alex Edwards EUM:[id number] DOB: 08/28/1935 DOA: 11/17/2020  PCP: [name], MD  Admit date: 11/17/2020 Discharge date: 11/22/2020 30 Day Unplanned Readmission Risk Score   Flowsheet Row ED to Hosp-Admission (Current) from 11/17/2020 in [place]  30 Day Unplanned Readmission Risk Score (%) 9.66 Filed at 11/22/2020 0800     This score is the patient's risk of an unplanned readmission within 30 days of being discharged (0 -100%). The score is based on dignosis, age, lab data, medications, orders, and past utilization.   Low:  0-14.9   Medium: 15-21.9   High: 22-29.9   Extreme: 30 and above         Admitted From: Home Disposition: Home   Recommendations for Outpatient Follow-up:  1. Follow up with PCP in 1-2 weeks 2. Follow-up with oncology in 1 week 3. Follow-up with radiation oncology in 1 week 4. Follow-up with vascular surgery in 6 months 5. Please obtain BMP/CBC in one week 6. Please follow up with your PCP on the following pending results: Unresulted Labs (From admission, onward)         None        Home Health: None Equipment/Devices: Home oxygen  Discharge Condition: Stable CODE STATUS: Full code Diet recommendation: Cardiac  Subjective: Seen and examined.  He has been off of oxygen since last night and denies any shortness of breath and is eager to go home.  Brief/Interim Summary: 85 year old male with past medical history of hypertension, hyperlipidemia,basal cell epithelioma,tobacco abuse, type 2 diabeteswho presented to the ED with gradually worsening shortness of breath on exertion and rest,productive cough, hemoptysisand fever for several days. he smokes 1 pack/day of cigarettes intermittently for years and quit last week when his breathing and coughing were worsening.  He met criteria for severe sepsis in the ED with tachycardic, tachypnea, hypoxia (SpO2 86% on room air) placed  on4L/min in the setting of likely post-obstructive Pneumonia secondary to a newly discovered left upper lobe lung mass identified on CT chest in the ED. Patient started on IV antibiotics for CAP and pulmonology consulted and he underwent bronchoscopy and tissue biopsy on 11/18/2020 and pathology showed non-small cell lung carcinoma.  He was also seen by oncology who ended up consulting radiation oncology.  MRI brain was done which showed no metastasis.  Radiation oncology recommends 6 and half week of radiation every day as outpatient.  He was then found to have a mobile and nontender mass at the left neck/jaw angle.  Per patient, this has been there for more than 35 years and his PCP is aware of that.  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  IR was consulted and he underwent CT-guided biopsy of that and pathology report shows Warthin's tumor.  He was incidentally found to have high-grade right internal carotid stenosis 80 to 99% for which vascular surgery was consulted and since this was asymptomatic and he was going through new diagnosis of cancer, they recommended watchful waiting and follow-up with vascular surgery as outpatient.  They also recommended antiplatelet, at least low-dose aspirin and statin however patient declined to use a statin having the past experience of body aches with all sort of statins.  Although improving but he is still having hemoptysis and for that reason I have deferred starting him on low-dose aspirin until his hemoptysis is completely resolved.  I have advised him to follow-up with his  PCP so he can be assessed and his PCP can further decide when would be the right time to start him on baby aspirin per vascular surgery recommendation.  Patient received 6 days of IV Rocephin and Zithromax here.  PCCM recommended total of 10 days of antibiotics, rest of the days with oral Augmentin which is being prescribed to him.  He also had  conjunctivitis on the left eye which was treated and resolved.  AKI also resolved.  His blood pressure was slightly elevated and his losartan is being increased from 2m to 100 mg and he is also being sent home on 5 mg of amlodipine.  He is feeling much better today so he is being discharged home.  Ambulatory oximetry shows that he is requiring 2 L of oxygen with activity so he is being discharged on home oxygen and smoke and he is very well aware that he is advised to follow-up with PCP, oncology and vascular surgery as outpatient as well as radiation oncology.  Ambulatory oximetry was checked and he was requiring 2 L oxygen at activity and that has been ordered for him.  Night before discharge, he also mentioned that he was having some urinary incontinence and sometimes hesitancy.  Bladder scan was checked which showed 180 cc.  He is being prescribed Flomax.  He is recommended to follow-up with urology if his symptoms persist despite of using Flomax for about a week.  Discharge Diagnoses:  Active Problems:   Mandibular mass   Acute respiratory failure with hypoxia (HCC)   Severe sepsis (HCC)   CAP (community acquired pneumonia)   Lung mass   Conjunctivitis   NSCLC of left lung (HPorcupine   Internal carotid artery stenosis, right   Warthin's tumor    Discharge Instructions   Allergies as of 11/22/2020      Reactions   Vancomycin    Red Man Syndrome   Latex Other (See Comments)   "possibly allergic- dry, itching, rash"      Medication List    STOP taking these medications   atorvastatin 10 MG tablet Commonly known as: Lipitor   cephALEXin 500 MG capsule Commonly known as: KEFLEX     TAKE these medications   amLODipine 5 MG tablet Commonly known as: NORVASC Take 1 tablet (5 mg total) by mouth daily.   amoxicillin-clavulanate 875-125 MG tablet Commonly known as: Augmentin Take 1 tablet by mouth 2 (two) times daily for 5 days.   fexofenadine 180 MG tablet Commonly known as:  ALLEGRA Take 180 mg by mouth daily.   ketoconazole 2 % cream Commonly known as: NIZORAL APPLY TO THE AFFECTED AREA ONCE A DAY. What changed: See the new instructions.   losartan 100 MG tablet Commonly known as: Cozaar Take 1 tablet (100 mg total) by mouth daily. What changed:   medication strength  how much to take   metoprolol tartrate 50 MG tablet Commonly known as: LOPRESSOR TAKE ONE TABLET BY MOUTH TWICE DAILY.   MULTI VITAMIN DAILY PO Take 1 tablet by mouth daily.   OneTouch Delica Lancets Fine Misc Patient is to test one time a day DX: E11.9   OneTouch Verio test strip Generic drug: glucose blood USE TO TEST BLOOD SUGAR ONCE DAILY.   PreserVision AREDS 2 Caps Take 1 capsule by mouth in the morning and at bedtime.   triamcinolone cream 0.1 % Commonly known as: KENALOG APPLY TO AFFECTED AREA TWICE A DAY. What changed: See the new instructions.   vitamin C 1000  MG tablet Take 1,000 mg by mouth daily.       Follow-up Information    [name], MD Follow up in 1 week(s).   Specialty: Family Medicine Contact information: [place] [address] [phone]        [name], MD Follow up in 1 week(s).   Specialty: Oncology Contact information: [place] [address] [phone]        Waynetta Sandy, MD Follow up in 6 month(s).   Specialties: Vascular Surgery, Cardiology Contact information: [address] [place] [address] [phone]              Allergies  Allergen Reactions  . Vancomycin     Red Man Syndrome   . Latex Other (See Comments)    "possibly allergic- dry, itching, rash"    Consultations: Oncology, IR, radiation oncology, vascular surgery,   Procedures/Studies: CT SOFT TISSUE NECK WO CONTRAST  Result Date: 11/19/2020 CLINICAL DATA:  Parotid region mass. Additional history provided: Patient reports lump under left jaw for 30+ years. EXAM: CT  NECK WITHOUT CONTRAST TECHNIQUE: Multidetector CT imaging of the neck was performed following the standard protocol without intravenous contrast. COMPARISON:  Chest CT 11/17/2020. FINDINGS: Mildly motion degraded exam. Pharynx and larynx: No appreciable swelling or discrete mass within the oral cavity, pharynx or larynx on this noncontrast examination. Salivary glands: 6.1 x 2.9 cm ovoid slightly heterogeneous soft tissue focus along the inferior aspect of the left parotid gland, extending to the left level 2 station and overlying the anterior aspect of the left sternocleidomastoid muscle. The right parotid and bilateral submandibular glands are unremarkable. Thyroid: Unremarkable. Lymph nodes: Please refer to salivary gland findings above. Elsewhere within the neck, no pathologically enlarged cervical chain lymph nodes are identified. Vascular: There is limited assessment of the major vascular structures of the neck in the absence of intravenous contrast. Calcified atherosclerotic plaque within the visualized aortic arch, proximal major branch vessels of the neck and carotid arteries. Calcified plaque within the proximal right ICA could result in a hemodynamically significant stenosis. Limited intracranial: No acute intracranial abnormality identified within the field of view. Visualized orbits: Incompletely imaged. No mass or acute finding at the imaged levels. Mastoids and visualized paranasal sinuses: No significant paranasal sinus disease or mastoid effusion at the imaged levels. Skeleton: Reversal of the expected cervical lordosis. Mild C6-C7 and C7-T1 grade 1 anterolisthesis. Cervical spondylosis. No acute bony abnormality or aggressive osseous lesion identified. Upper chest: Incompletely imaged known large left upper lobe lung mass. IMPRESSION: 6.1 x 2.9 cm ovoid soft tissue focus within the left upper neck, along the inferior aspect of the left parotid gland. Favored differential considerations are primary  parotid neoplasm versus nodal metastatic disease. Direct tissue sampling should be considered. Elsewhere within the neck, no pathologically enlarged lymph nodes are identified. Partially imaged known left upper lobe pulmonary mass suspicious for bronchogenic carcinoma. Atherosclerotic disease as described. Notably, calcified plaque at the origin of the right ICA could result in a hemodynamically significant stenosis. Carotid artery duplex recommended for further evaluation. Cervical spondylosis. Electronically Signed   By: Kellie Simmering DO   On: 11/19/2020 13:25   CT Chest Wo Contrast  Result Date: 11/17/2020 CLINICAL DATA:  Short of breath. EXAM: CT CHEST WITHOUT CONTRAST TECHNIQUE: Multidetector CT imaging of the chest was performed following the standard protocol without IV contrast. COMPARISON:  01/03/2014 FINDINGS: Cardiovascular: Heart size is normal. Aortic atherosclerosis. Coronary artery calcifications. No pericardial effusion. Mediastinum/Nodes:  Normal appearance of the thyroid gland. The trachea appears patent and is midline. Normal appearance of the esophagus. No axillary or supraclavicular adenopathy. Left paratracheal lymph node measures 1.4 cm, image 59/2. Subcarinal node measures 1.3 cm, image 73/2. The hilar lymph nodes are suboptimally evaluated due to lack of IV contrast material. Lungs/Pleura: Within the left upper lobe there is a large mass measuring 7.3 cm in maximum dimension, image 65/5 and image 90/6. Internal areas of gas within this mass are noted concerning for central necrosis. Surrounding interlobular septal thickening is noted within the left upper lobe concerning for lymphangitic spread of tumor. Scattered areas of tree-in-bud nodularity are identified throughout both lungs most notable within the lower lung zones. Peripheral areas of subpleural consolidation noted within the posterior and lateral right lower lobe. Upper Abdomen: No acute abnormality within the imaged portions of  the upper abdomen. Nodule in the left adrenal gland measures 1.9 cm, image 157/2. Unchanged from 2015 compatible with a benign abnormality. Musculoskeletal: No chest wall mass or suspicious bone lesions identified. IMPRESSION: 1. Large left upper lobe lung mass is identified with internal areas of gas concerning for central necrosis. Findings are worrisome for primary bronchogenic carcinoma. Surrounding interlobular septal thickening is concerning for lymphangitic spread of tumor. Further evaluation with PET-CT and tissue sampling is advised. 2. Enlarged left paratracheal and subcarinal lymph nodes are identified. Cannot rule out metastatic adenopathy. 3. Scattered areas of tree-in-bud nodularity are identified throughout both lungs consistent with inflammatory or infectious bronchiolitis. 4. Peripheral areas of subpleural consolidation within the posterior and lateral right lower lobe are noted which may represent areas of atelectasis or pneumonia. 5. Aortic atherosclerosis. Coronary artery calcifications. Aortic Atherosclerosis (ICD10-I70.0). Electronically Signed   By: Kerby Moors M.D.   On: 11/17/2020 13:52   MR BRAIN W WO CONTRAST  Result Date: 11/20/2020 CLINICAL DATA:  Lung cancer, staging EXAM: MRI HEAD WITHOUT AND WITH CONTRAST TECHNIQUE: Multiplanar, multiecho pulse sequences of the brain and surrounding structures were obtained without and with intravenous contrast. CONTRAST:  32m GADAVIST GADOBUTROL 1 MMOL/ML IV SOLN COMPARISON:  None. FINDINGS: Brain: There is no acute infarction or intracranial hemorrhage. There is no intracranial mass, mass effect, or edema. There is no hydrocephalus or extra-axial fluid collection. Ventricles and sulci are within normal limits in size and configuration. Patchy small foci of T2 hyperintensity in the supratentorial white matter are nonspecific but may reflect minor chronic microvascular ischemic changes. No abnormal enhancement. Vascular: Major vessel flow  voids at the skull base are preserved. Skull and upper cervical spine: Normal marrow signal is preserved. Sinuses/Orbits: Paranasal sinuses are aerated. Bilateral lens replacements. Other: Sella is partially empty. Mastoid air cells are clear. Partially imaged left neck mass along the inferior parotid better evaluated on recent neck CT. IMPRESSION: No evidence of intracranial metastatic disease. Electronically Signed   By: PMacy MisM.D.   On: 11/20/2020 09:00   DG CHEST PORT 1 VIEW  Result Date: 11/21/2020 CLINICAL DATA:  85year old male with history of hypoxia. EXAM: PORTABLE CHEST 1 VIEW COMPARISON:  Chest x-ray 11/17/2020. FINDINGS: Large mass in the periphery of the left mid lung, similar to the recent prior study, estimated to measure approximately 8.8 x 6.3 cm. Areas of interstitial prominence an ill-defined opacities surrounding the lesion and in the left lung base may reflect postobstructive changes and/or lymphangitic spread of tumor as demonstrated on prior chest CT. Small left pleural effusion. Right lung is clear. No right pleural effusion. No pneumothorax. No evidence of pulmonary  edema. Heart size is normal. Upper mediastinal contours are within normal limits. Aortic atherosclerosis. IMPRESSION: 1. Large left-sided lung mass previously demonstrated to be in the left upper lobe with surrounding parenchymal changes suggestive of lymphangitic spread of tumor and/or postobstructive changes with small left pleural effusion. 2. Aortic atherosclerosis. Electronically Signed   By: Vinnie Langton M.D.   On: 11/21/2020 14:27   DG Chest Portable 1 View  Result Date: 11/17/2020 CLINICAL DATA:  Worsening cough, congestion, shortness of breath. Bloods tinged sputum. EXAM: PORTABLE CHEST 1 VIEW COMPARISON:  February 14, 2018. FINDINGS: Rounded/masslike 8 cm opacity in the lateral left midlung. Mild diffuse interstitial prominence. No visible pleural effusions or pneumothorax. Similar cardiomediastinal  silhouette. IMPRESSION: 1. Rounded/masslike 8 cm opacity in the lateral left midlung. Recommend chest CT to evaluate for possible mass. 2. Diffuse interstitial prominence, possibly chronic bronchitic related change given similar findings on the prior. The recommend chest CT can also better characterize this finding and evaluate for superimposed interstitial edema or atypical infection. Electronically Signed   By: Margaretha Sheffield MD   On: 11/17/2020 12:05   [place] CORE BIOPSY (SALIVARY GLAND/PAROTID GLAND)  Result Date: 11/20/2020 CLINICAL DATA:  Parotid region mass, left submandibular lump EXAM: ULTRASOUND GUIDED CORE BIOPSY OF LEFT NECK MASS MEDICATIONS: Intravenous Fentanyl 62mg and Versed 175mwere administered as conscious sedation during continuous monitoring of the patient's level of consciousness and physiological / cardiorespiratory status by the radiology RN, with a total moderate sedation time of 20 minutes. PROCEDURE: The procedure, risks, benefits, and alternatives were explained to the patient. Questions regarding the procedure were encouraged and answered. The patient understands and consents to the procedure. Survey ultrasound of the left neck was performed. The complex left neck mass was localized and an appropriate skin entry site was determined and marked. The operative field was prepped with chlorhexidine in a sterile fashion, and a sterile drape was applied covering the operative field. A sterile gown and sterile gloves were used for the procedure. Local anesthesia was provided with 1% Lidocaine. Under real-time ultrasound guidance, a 17 gauge trocar needle was advanced to the margin of the lesion. Once needle tip position was confirmed, coaxial 18-gauge core biopsy samples were obtained, submitted in saline to surgical pathology. The guide needle was removed. Postprocedure scans show no hemorrhage or other apparent complication. The patient tolerated the procedure well. COMPLICATIONS: None.  FINDINGS: Complex left neck mass was localized corresponding to CT findings. Representative core biopsy samples obtained as above. IMPRESSION: 1. Technically successful ultrasound-guided core biopsy, left neck mass. Electronically Signed   By: D Lucrezia Europe.D.   On: 11/20/2020 13:47   VAS US[place]AROTID  Result Date: 11/19/2020 Carotid Arterial Duplex Study Patient Name:  ARKSEAN VALER  Date of Exam:   11/19/2020 Medical Rec #: [id number]      Accession #:    [id number] of Birth: 4/05-Jun-1936       Patient Gender: M Patient Age:   85Y [facility]      [facility] Referring Phys: [id number]-------------------------------------------------------------------------------  Indications:       Stenosis. Risk Factors:      Hypertension, hyperlipidemia. Comparison Study:  No prior studies. Performing Technologist: GrOliver HumVT  Examination Guidelines: A complete evaluation includes B-mode imaging, spectral Doppler, color Doppler, and power Doppler as needed of all accessible portions of each vessel. Bilateral testing is considered an integral part of a complete examination.  Limited examinations for reoccurring indications may be performed as noted.  Right Carotid Findings: +----------+--------+--------+--------+--------------------------+--------+           PSV cm/sEDV cm/sStenosisPlaque Description        Comments +----------+--------+--------+--------+--------------------------+--------+ CCA Prox  99      20              smooth and heterogenous            +----------+--------+--------+--------+--------------------------+--------+ CCA Distal64      15              smooth and heterogenous            +----------+--------+--------+--------+--------------------------+--------+ ICA Prox  453     165     80-99%  irregular and heterogenous         +----------+--------+--------+--------+--------------------------+--------+ ICA Mid   267      91      60-79%                                     +----------+--------+--------+--------+--------------------------+--------+ ICA Distal130     54                                        tortuous +----------+--------+--------+--------+--------------------------+--------+ ECA       233     15                                                 +----------+--------+--------+--------+--------------------------+--------+ +----------+--------+-------+--------+-------------------+           PSV cm/sEDV cmsDescribeArm Pressure (mmHG) +----------+--------+-------+--------+-------------------+ Subclavian213                                        +----------+--------+-------+--------+-------------------+ +---------+--------+--+--------+--+---------+ VertebralPSV cm/s58EDV cm/s14Antegrade +---------+--------+--+--------+--+---------+  Left Carotid Findings: +----------+--------+--------+--------+-----------------------+--------+           PSV cm/sEDV cm/sStenosisPlaque Description     Comments +----------+--------+--------+--------+-----------------------+--------+ CCA Prox  129     26              smooth and heterogenous         +----------+--------+--------+--------+-----------------------+--------+ CCA Distal119     28              smooth and heterogenous         +----------+--------+--------+--------+-----------------------+--------+ ICA Prox  133     46      40-59%  smooth and heterogenous         +----------+--------+--------+--------+-----------------------+--------+ ICA Distal199     42                                     tortuous +----------+--------+--------+--------+-----------------------+--------+ ECA       193     12                                              +----------+--------+--------+--------+-----------------------+--------+ +----------+--------+--------+--------+-------------------+  PSV cm/sEDV cm/sDescribeArm  Pressure (mmHG) +----------+--------+--------+--------+-------------------+ Subclavian151                                         +----------+--------+--------+--------+-------------------+ +---------+--------+--+--------+--+---------+ VertebralPSV cm/s59EDV cm/s15Antegrade +---------+--------+--+--------+--+---------+   Summary: Right Carotid: Velocities in the right ICA are consistent with a 80-99%                stenosis. Left Carotid: Velocities in the left ICA are consistent with a 40-59% stenosis. Vertebrals: Bilateral vertebral arteries demonstrate antegrade flow. *See table(s) above for measurements and observations.  Electronically signed by Harold Barban MD on 11/19/2020 at 9:21:07 PM.    Final    DG C-ARM BRONCHOSCOPY  Result Date: 11/18/2020 C-ARM BRONCHOSCOPY: Fluoroscopy was utilized by the requesting physician.  No radiographic interpretation.      Discharge Exam: Vitals:   11/22/20 0431 11/22/20 0919  BP: (!) 169/85 (!) 174/93  Pulse: 82 93  Resp: (!) 24 18  Temp: (!) 97.5 F (36.4 C)   SpO2: 91% 92%   Vitals:   11/21/20 1424 11/21/20 2024 11/22/20 0431 11/22/20 0919  BP:  (!) 154/72 (!) 169/85 (!) 174/93  Pulse:  89 82 93  Resp: 18 20 (!) 24 18  Temp:  (!) 97.5 F (36.4 C) (!) 97.5 F (36.4 C)   TempSrc:  Oral Oral   SpO2:  96% 91% 92%  Weight:      Height:        General: Pt is alert, awake, not in acute distress, obese Cardiovascular: RRR, S1/S2 +, no rubs, no gallops Respiratory: CTA bilaterally, no wheezing, no rhonchi Abdominal: Soft, NT, ND, bowel sounds + Extremities: no edema, no cyanosis    The results of significant diagnostics from this hospitalization (including imaging, microbiology, ancillary and laboratory) are listed below for reference.     Microbiology: Recent Results (from the past 240 hour(s))  Resp Panel by RT-PCR (Flu A&B, Covid) Nasopharyngeal Swab     Status: None   Collection Time: 11/17/20 11:16 AM   Specimen:  Nasopharyngeal Swab; Nasopharyngeal(NP) swabs in vial transport medium  Result Value Ref Range Status   SARS Coronavirus 2 by RT PCR NEGATIVE NEGATIVE Final    Comment: (NOTE) SARS-CoV-2 target nucleic acids are NOT DETECTED.  The SARS-CoV-2 RNA is generally detectable in upper respiratory specimens during the acute phase of infection. The lowest concentration of SARS-CoV-2 viral copies this assay can detect is 138 copies/mL. A negative result does not preclude SARS-Cov-2 infection and should not be used as the sole basis for treatment or other patient management decisions. A negative result may occur with  improper specimen collection/handling, submission of specimen other than nasopharyngeal swab, presence of viral mutation(s) within the areas targeted by this assay, and inadequate number of viral copies(<138 copies/mL). A negative result must be combined with clinical observations, patient history, and epidemiological information. The expected result is Negative.  Fact Sheet for Patients:  EntrepreneurPulse.com.au  Fact Sheet for Healthcare Providers:  IncredibleEmployment.be  This test is no t yet approved or cleared by the [place] FDA and  has been authorized for detection and/or diagnosis of SARS-CoV-2 by FDA under an Emergency Use Authorization (EUA). This EUA will remain  in effect (meaning this test can be used) for the duration of the COVID-19 declaration under Section 564(b)(1) of the Act, 21 U.S.C.section 360bbb-3(b)(1), unless the authorization is terminated  or revoked sooner.  Influenza A by PCR NEGATIVE NEGATIVE Final   Influenza B by PCR NEGATIVE NEGATIVE Final    Comment: (NOTE) The Xpert Xpress SARS-CoV-2/FLU/RSV plus assay is intended as an aid in the diagnosis of influenza from Nasopharyngeal swab specimens and should not be used as a sole basis for treatment. Nasal washings and aspirates are unacceptable for  Xpert Xpress SARS-CoV-2/FLU/RSV testing.  Fact Sheet for Patients: EntrepreneurPulse.com.au  Fact Sheet for Healthcare Providers: IncredibleEmployment.be  This test is not yet approved or cleared by the [place] FDA and has been authorized for detection and/or diagnosis of SARS-CoV-2 by FDA under an Emergency Use Authorization (EUA). This EUA will remain in effect (meaning this test can be used) for the duration of the COVID-19 declaration under Section 564(b)(1) of the Act, 21 U.S.C. section 360bbb-3(b)(1), unless the authorization is terminated or revoked.  Performed at [facility], [place] [address] [place], [place] [address]   Blood Culture (routine x 2)     Status: None   Collection Time: 11/17/20 11:30 AM   Specimen: BLOOD RIGHT ARM  Result Value Ref Range Status   Specimen Description   Final    BLOOD RIGHT ARM Performed at [facility] [address], [place], [place] [address]    Special Requests   Final    BOTTLES DRAWN AEROBIC AND ANAEROBIC Blood Culture adequate volume Performed at [place] [address], [place], [place] [address]    Culture   Final    NO GROWTH 5 DAYS Performed at [facility], [place] [address], [place], [place] [address]    Report Status 11/22/2020 FINAL  Final  Blood Culture (routine x 2)     Status: None   Collection Time: 11/17/20 11:35 AM   Specimen: BLOOD  Result Value Ref Range Status   Specimen Description   Final    BLOOD LEFT ANTECUBITAL Performed at [place] [address], [place], [place] [address]    Special Requests   Final    BOTTLES DRAWN AEROBIC AND ANAEROBIC Blood Culture adequate volume Performed at [place] [address], [place], [place] [address]    Culture   Final    NO GROWTH 5 DAYS Performed at [facility], [place] [address], [place], [place]  [address]    Report Status 11/22/2020 FINAL  Final  Urine culture     Status: None   Collection Time: 11/17/20  4:45 PM   Specimen: In/Out Cath Urine  Result Value Ref Range Status   Specimen Description   Final    IN/OUT CATH URINE Performed at [place] [address] [place], [place] [address]    Special Requests   Final    NONE Performed at [facility], [place] [address] [place], [place] [address]    Culture   Final    NO GROWTH Performed at [facility], [place] [address] [place],  [address]    Report Status 11/19/2020 FINAL  Final  MRSA PCR Screening     Status: None   Collection Time: 11/17/20  5:35 PM   Specimen: Nasopharyngeal  Result Value Ref Range Status   MRSA by PCR NEGATIVE NEGATIVE Final    Comment:        The GeneXpert MRSA Assay (FDA approved for NASAL specimens only), is one component of a comprehensive MRSA colonization surveillance program. It is not intended to diagnose MRSA infection  nor to guide or monitor treatment for MRSA infections. Performed at [facility], [place] [address], [place], [place] [address]   Culture, Respiratory w Gram Stain     Status: None   Collection Time: 11/18/20  3:15 PM   Specimen: Bronchial Alveolar Lavage; Respiratory  Result Value Ref Range Status   Specimen Description   Final    BRONCHIAL ALVEOLAR LAVAGE LUL Performed at [place] [address], [place], [place] [address]    Special Requests   Final    NONE Performed at [facility], [place] [address] [place], [place] [address]    Gram Stain   Final    ABUNDANT WBC PRESENT, PREDOMINANTLY PMN NO ORGANISMS SEEN    Culture   Final    NO GROWTH 2 DAYS Performed at [facility], [place] [address], [place], [place] [address]    Report Status 11/21/2020 FINAL  Final     Labs: BNP (last 3 results) No results for input(s): BNP in the last 8760  hours. Basic Metabolic Panel: Recent Labs  Lab 11/18/20 0443 11/19/20 0157 11/20/20 0437 11/20/20 1010 11/21/20 0511 11/22/20 0419  NA 138 139 142  --  143 144  K 4.6 5.1 6.1* 4.3 4.4 4.2  CL 102 103 104  --  105 105  CO2 30 26 31   --  27 33*  GLUCOSE 135* 217* 110*  --  122* 123*  BUN 19 33* 29*  --  25* 19  CREATININE 1.04 0.96 1.02  --  0.73 0.75  CALCIUM 8.3* 8.5* 8.6*  --  8.8* 8.9   Liver Function Tests: Recent Labs  Lab 11/17/20 1144  AST 25  ALT 18  ALKPHOS 70  BILITOT 0.5  PROT 7.3  ALBUMIN 3.2*   No results for input(s): LIPASE, AMYLASE in the last 168 hours. No results for input(s): AMMONIA in the last 168 hours. CBC: Recent Labs  Lab 11/17/20 1144 11/18/20 0443 11/19/20 0157 11/20/20 0437 11/21/20 0511 11/22/20 0419  WBC 18.7* 17.8* 15.9* 18.4* 14.4* 12.7*  NEUTROABS 16.1*  --   --   --   --   --   HGB 12.8* 11.9* 12.5* 12.7* 12.7* 12.5*  HCT 40.3 38.3* 40.5 41.2 41.5 39.3  MCV 95.3 98.0 99.0 99.0 98.3 95.9  PLT 352 343 337 417* 362 380   Cardiac Enzymes: No results for input(s): CKTOTAL, CKMB, CKMBINDEX, TROPONINI in the last 168 hours. BNP: Invalid input(s): POCBNP CBG: Recent Labs  Lab 11/21/20 0744 11/21/20 1121 11/21/20 1647 11/21/20 2120 11/22/20 0726  GLUCAP 120* 108* 112* 119* 119*   D-Dimer No results for input(s): DDIMER in the last 72 hours. Hgb A1c No results for input(s): HGBA1C in the last 72 hours. Lipid Profile No results for input(s): CHOL, HDL, LDLCALC, TRIG, CHOLHDL, LDLDIRECT in the last 72 hours. Thyroid function studies No results for input(s): TSH, T4TOTAL, T3FREE, THYROIDAB in the last 72 hours.  Invalid input(s): FREET3 Anemia work up No results for input(s): VITAMINB12, FOLATE, FERRITIN, TIBC, IRON, RETICCTPCT in the last 72 hours. Urinalysis    Component Value Date/Time   COLORURINE YELLOW 11/17/2020 1645   APPEARANCEUR HAZY (A) 11/17/2020 1645   LABSPEC 1.025 11/17/2020 1645   PHURINE 5.0  11/17/2020 1645   GLUCOSEU NEGATIVE 11/17/2020 1645   HGBUR NEGATIVE 11/17/2020 1645   BILIRUBINUR NEGATIVE 11/17/2020 1645   KETONESUR 5 (A) 11/17/2020 1645   PROTEINUR 100 (A) 11/17/2020 1645   UROBILINOGEN 0.2 02/12/2014  [place] 11/17/2020 [place] 11/17/2020 1645   Sepsis Labs Invalid input(s): PROCALCITONIN,  WBC,  LACTICIDVEN Microbiology Recent Results (from the past 240 hour(s))  Resp Panel by RT-PCR (Flu A&B, Covid) Nasopharyngeal Swab     Status: None   Collection Time: 11/17/20 11:16 AM   Specimen: Nasopharyngeal Swab; Nasopharyngeal(NP) swabs in vial transport medium  Result Value Ref Range Status   SARS Coronavirus 2 by RT PCR NEGATIVE NEGATIVE Final    Comment: (NOTE) SARS-CoV-2 target nucleic acids are NOT DETECTED.  The SARS-CoV-2 RNA is generally detectable in upper respiratory specimens during the acute phase of infection. The lowest concentration of SARS-CoV-2 viral copies this assay can detect is 138 copies/mL. A negative result does not preclude SARS-Cov-2 infection and should not be used as the sole basis for treatment or other patient management decisions. A negative result may occur with  improper specimen collection/handling, submission of specimen other than nasopharyngeal swab, presence of viral mutation(s) within the areas targeted by this assay, and inadequate number of viral copies(<138 copies/mL). A negative result must be combined with clinical observations, patient history, and epidemiological information. The expected result is Negative.  Fact Sheet for Patients:  EntrepreneurPulse.com.au  Fact Sheet for Healthcare Providers:  IncredibleEmployment.be  This test is no t yet approved or cleared by the [place] FDA and  has been authorized for detection and/or diagnosis of SARS-CoV-2 by FDA under an Emergency Use Authorization (EUA). This EUA will remain  in effect  (meaning this test can be used) for the duration of the COVID-19 declaration under Section 564(b)(1) of the Act, 21 U.S.C.section 360bbb-3(b)(1), unless the authorization is terminated  or revoked sooner.       Influenza A by PCR NEGATIVE NEGATIVE Final   Influenza B by PCR NEGATIVE NEGATIVE Final    Comment: (NOTE) The Xpert Xpress SARS-CoV-2/FLU/RSV plus assay is intended as an aid in the diagnosis of influenza from Nasopharyngeal swab specimens and should not be used as a sole basis for treatment. Nasal washings and aspirates are unacceptable for Xpert Xpress SARS-CoV-2/FLU/RSV testing.  Fact Sheet for Patients: EntrepreneurPulse.com.au  Fact Sheet for Healthcare Providers: IncredibleEmployment.be  This test is not yet approved or cleared by the [place] FDA and has been authorized for detection and/or diagnosis of SARS-CoV-2 by FDA under an Emergency Use Authorization (EUA). This EUA will remain in effect (meaning this test can be used) for the duration of the COVID-19 declaration under Section 564(b)(1) of the Act, 21 U.S.C. section 360bbb-3(b)(1), unless the authorization is terminated or revoked.  Performed at [facility], [place] [address], [place], [place] [address]   Blood Culture (routine x 2)     Status: None   Collection Time: 11/17/20 11:30 AM   Specimen: BLOOD RIGHT ARM  Result Value Ref Range Status   Specimen Description   Final    BLOOD RIGHT ARM Performed at [place] [address] [place], [place] [address]    Special Requests   Final    BOTTLES DRAWN AEROBIC AND ANAEROBIC Blood Culture adequate volume Performed at [place] [address] [place], [place] [address]    Culture   Final    NO GROWTH 5 DAYS Performed at [facility], [place] [address], [place], [place] [address]    Report Status 11/22/2020 FINAL  Final  Blood Culture (routine  x 2)     Status: None   Collection Time:  11/17/20 11:35 AM   Specimen: BLOOD  Result Value Ref Range Status   Specimen Description   Final    BLOOD LEFT ANTECUBITAL Performed at [place] [address], [place], [place] [address]    Special Requests   Final    BOTTLES DRAWN AEROBIC AND ANAEROBIC Blood Culture adequate volume Performed at [place] [address], [place], [place] [address]    Culture   Final    NO GROWTH 5 DAYS Performed at [facility], [place] [address], [place], [place] [address]    Report Status 11/22/2020 FINAL  Final  Urine culture     Status: None   Collection Time: 11/17/20  4:45 PM   Specimen: In/Out Cath Urine  Result Value Ref Range Status   Specimen Description   Final    IN/OUT CATH URINE Performed at [place] [address], [place], [place] [address]    Special Requests   Final    NONE Performed at [facility], [place] [address] [place], [place] [address]    Culture   Final    NO GROWTH Performed at [facility], [place] [address] [place], [place] [address]    Report Status 11/19/2020 FINAL  Final  MRSA PCR Screening     Status: None   Collection Time: 11/17/20  5:35 PM   Specimen: Nasopharyngeal  Result Value Ref Range Status   MRSA by PCR NEGATIVE NEGATIVE Final    Comment:        The GeneXpert MRSA Assay (FDA approved for NASAL specimens only), is one component of a comprehensive MRSA colonization surveillance program. It is not intended to diagnose MRSA infection nor to guide or monitor treatment for MRSA infections. Performed at [facility], [place] [address], [place], [place] [address]   Culture, Respiratory w Gram Stain     Status: None   Collection Time: 11/18/20  3:15 PM   Specimen: Bronchial Alveolar Lavage; Respiratory  Result Value Ref Range Status   Specimen Description   Final    BRONCHIAL ALVEOLAR LAVAGE  LUL Performed at [place] [address] [place], [place] [address]    Special Requests   Final    NONE Performed at [facility], [place] [address] [place], [place] [address]    Gram Stain   Final    ABUNDANT WBC PRESENT, PREDOMINANTLY PMN NO ORGANISMS SEEN    Culture   Final    NO GROWTH 2 DAYS Performed at [facility], [place] [address], [place], [place] [address]    Report Status 11/21/2020 FINAL  Final     Time coordinating discharge: Over 30 minutes  SIGNED:   Darliss Cheney, MD  [facility] 11/22/2020, 10:22 AM  If 7PM-7AM, please contact night-coverage [email or web address]

## 2020-11-22 NOTE — TOC Transition Note (Addendum)
Transition of Care [facility]) - CM/SW Discharge Note   Patient Details  Name: Alex [place] MRN: [id number] Date of Birth: [birth year]  [facility] of [facility] CM/SW Contact:  Ross Ludwig, LCSW Phone Number: 11/22/2020, 2:17 PM   Clinical Narrative:    Oxygen was ordered through [facility].  It will be delivered today before patient leaves.  Patient will be going home with home health through [facility].  CSW signing off please reconsult with any other social work needs, home health agency has been notified of planned discharge.    Final next level of care: [place] Barriers to Discharge: Barriers Resolved   Patient Goals and CMS Choice Patient states their goals for this hospitalization and ongoing recovery are:: To return back home with home health. CMS Medicare.gov Compare Post Acute Care list provided to:: Patient Represenative (must comment) Choice offered to / list presented to : Spouse  Discharge Placement  Patient discharging back home with home health and oxygen.                     Discharge Plan and Services                DME Arranged: Oxygen DME Agency: [facility] Date DME Agency Contacted: 11/22/20 Time DME Agency Contacted: [phone] Representative spoke with at DME Agency: [place]: PT [place]: [place] Date [place]: 11/21/20 Time [place]: [id number]    Social Determinants of Health (SDOH) Interventions     Readmission Risk Interventions No flowsheet data found.

## 2020-11-22 NOTE — Progress Notes (Addendum)
Patient has been discharged. Instructions reviewed, questions concerns denied at this time. Pt stable, A&Ox4 ambulatory with use of walker. Pt encouraged to schedule f/u with PCP. Oxygen applied for transport.

## 2020-11-25 ENCOUNTER — Telehealth: Payer: Self-pay | Admitting: *Deleted

## 2020-11-25 ENCOUNTER — Telehealth: Payer: Self-pay

## 2020-11-25 NOTE — Telephone Encounter (Signed)
I called [name] today to schedule him with Dr. Julien Nordmann. I spoke to him and gave him an appt on 11/26/20 but he would like to be seen after 6/7.  I will call him back with an appt.

## 2020-11-25 NOTE — Telephone Encounter (Signed)
I called pt. Per TOC report and got him scheduled for a hospital f/u here on 12/02/20 and his medications were gone over and reconciled.

## 2020-11-27 ENCOUNTER — Telehealth: Payer: Self-pay | Admitting: *Deleted

## 2020-11-27 NOTE — Telephone Encounter (Signed)
I called Alex [place] today to schedule a follow up with Dr. Julien Nordmann.  I was unable to reach him nor leave a vm message.

## 2020-12-01 ENCOUNTER — Telehealth: Payer: Self-pay | Admitting: Internal Medicine

## 2020-12-01 NOTE — Telephone Encounter (Signed)
Called and spoke with patient, advised that he will be seeing Dr. Julien Nordmann at the [facility] at [facility] and they have been trying to contact him to schedule an appointment with him.  Advised to be on the look out for a call from Dr. Worthy Flank office to schedule that appointment for him.  I also let him know he will see Dr. Shearon Stalls on 6/28.  He verbalized understanding.  Nothing further needed.  Hi Dana, I just wanted to let you know that I spoke with [name] and let him know that you had been trying to contact him to set up an appointment with Dr. Julien Nordmann and to be on the lookout for a phone call from his office.  Thank you.

## 2020-12-02 ENCOUNTER — Other Ambulatory Visit: Payer: Self-pay

## 2020-12-02 ENCOUNTER — Inpatient Hospital Stay: Payer: Medicare Other | Admitting: Internal Medicine

## 2020-12-02 ENCOUNTER — Ambulatory Visit (INDEPENDENT_AMBULATORY_CARE_PROVIDER_SITE_OTHER): Payer: Medicare Other | Admitting: Family Medicine

## 2020-12-02 ENCOUNTER — Encounter: Payer: Self-pay | Admitting: Family Medicine

## 2020-12-02 ENCOUNTER — Telehealth: Payer: Self-pay | Admitting: *Deleted

## 2020-12-02 VITALS — BP 110/64 | HR 83 | Temp 97.5°F | Wt 235.2 lb

## 2020-12-02 DIAGNOSIS — C3492 Malignant neoplasm of unspecified part of left bronchus or lung: Secondary | ICD-10-CM

## 2020-12-02 DIAGNOSIS — I6521 Occlusion and stenosis of right carotid artery: Secondary | ICD-10-CM

## 2020-12-02 DIAGNOSIS — D119 Benign neoplasm of major salivary gland, unspecified: Secondary | ICD-10-CM | POA: Diagnosis not present

## 2020-12-02 DIAGNOSIS — I152 Hypertension secondary to endocrine disorders: Secondary | ICD-10-CM | POA: Diagnosis not present

## 2020-12-02 DIAGNOSIS — E1159 Type 2 diabetes mellitus with other circulatory complications: Secondary | ICD-10-CM | POA: Diagnosis not present

## 2020-12-02 DIAGNOSIS — R32 Unspecified urinary incontinence: Secondary | ICD-10-CM

## 2020-12-02 MED ORDER — TAMSULOSIN HCL 0.4 MG PO CAPS: 0.4000 mg | ORAL_CAPSULE | Freq: Every day | ORAL | 3 refills | Status: DC

## 2020-12-02 NOTE — Patient Instructions (Signed)
Use the probiotic and this hold aspirin for right now.  Stay on the Flomax for the urine.  Go ahead and get the pulse ox he can keep track of your oxygen level and keep it above 92. Keep your legs elevated is much as possible during the day and wear support stockings especially putting them on in the morning

## 2020-12-02 NOTE — Telephone Encounter (Signed)
Good morning, I am glad you were able to speak with him.  You are very welcome.

## 2020-12-02 NOTE — Progress Notes (Signed)
   Subjective:    Patient ID: Alex [place], male    DOB: 03/04/1936, 85 y.o.   MRN: [id number]  HPI He is here for hospital follow-up.  He is was recently in the hospital and subsequently diagnosed with large cell cancer.  He is getting follow-up with oncology as well as radiation oncology.  He was also noted to have right-sided carotid stenosis.  That was evaluated however further evaluation and treatment of that will be held for the present time.  Low-dose aspirin was considered however he is still having difficulty with hemoptysis.  He also has some urinary incontinence and was given Flomax.  This seems to be helping.  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  He is no longer on an antibiotic and is not having any difficulty with chest pain or shortness of breath.  He does have oxygen at home but does not have a pulse ox to keep track of that.  Presently he is using amlodipine and losartan for his blood pressure.   Review of Systems     Objective:   Physical Exam Alert and in no distress.  Cardiac exam shows regular rhythm without murmurs gallop.  Lungs are clear to auscultation.       Assessment & Plan:  Malignant neoplasm of left lung, unspecified part of lung ([place])  Warthin's tumor  Internal carotid artery stenosis, right  Hypertension associated with diabetes ([place])  Urinary incontinence, unspecified type - Plan: tamsulosin (FLOMAX) 0.4 MG CAPS capsule Did discuss a psychological component to this and he did become tearful.  Encouraged him to feel free to express those emotions. He will continue to be followed by oncology as well as radiation oncology.  We will hold aspirin for right now.  Recommend to use a probiotic.  Recommend he get a pulse ox to keep track of this oxygen level and keep it above 92.  Explained that he might not have trouble except when he is physically active.  Recheck here in several  months.

## 2020-12-02 NOTE — Telephone Encounter (Signed)
I called and spoke to [name]. I updated him on his appt with Dr. Julien Nordmann this week. He verbalized understanding.

## 2020-12-04 ENCOUNTER — Other Ambulatory Visit: Payer: Self-pay

## 2020-12-04 ENCOUNTER — Inpatient Hospital Stay: Payer: Medicare Other | Attending: Internal Medicine | Admitting: Internal Medicine

## 2020-12-04 ENCOUNTER — Inpatient Hospital Stay: Payer: Medicare Other

## 2020-12-04 VITALS — BP 144/57 | HR 86 | Temp 97.5°F | Resp 20 | Ht 73.0 in | Wt 235.0 lb

## 2020-12-04 DIAGNOSIS — Z79899 Other long term (current) drug therapy: Secondary | ICD-10-CM | POA: Diagnosis not present

## 2020-12-04 DIAGNOSIS — I10 Essential (primary) hypertension: Secondary | ICD-10-CM | POA: Insufficient documentation

## 2020-12-04 DIAGNOSIS — Z86010 Personal history of colonic polyps: Secondary | ICD-10-CM | POA: Diagnosis not present

## 2020-12-04 DIAGNOSIS — K5730 Diverticulosis of large intestine without perforation or abscess without bleeding: Secondary | ICD-10-CM | POA: Insufficient documentation

## 2020-12-04 DIAGNOSIS — C3492 Malignant neoplasm of unspecified part of left bronchus or lung: Secondary | ICD-10-CM | POA: Diagnosis not present

## 2020-12-04 DIAGNOSIS — Z5189 Encounter for other specified aftercare: Secondary | ICD-10-CM | POA: Insufficient documentation

## 2020-12-04 DIAGNOSIS — C7972 Secondary malignant neoplasm of left adrenal gland: Secondary | ICD-10-CM | POA: Insufficient documentation

## 2020-12-04 DIAGNOSIS — Z85828 Personal history of other malignant neoplasm of skin: Secondary | ICD-10-CM | POA: Insufficient documentation

## 2020-12-04 DIAGNOSIS — C3412 Malignant neoplasm of upper lobe, left bronchus or lung: Secondary | ICD-10-CM | POA: Insufficient documentation

## 2020-12-04 DIAGNOSIS — R5383 Other fatigue: Secondary | ICD-10-CM | POA: Insufficient documentation

## 2020-12-04 DIAGNOSIS — I700 Atherosclerosis of aorta: Secondary | ICD-10-CM | POA: Insufficient documentation

## 2020-12-04 DIAGNOSIS — R590 Localized enlarged lymph nodes: Secondary | ICD-10-CM | POA: Insufficient documentation

## 2020-12-04 DIAGNOSIS — N400 Enlarged prostate without lower urinary tract symptoms: Secondary | ICD-10-CM | POA: Diagnosis not present

## 2020-12-04 DIAGNOSIS — Z5111 Encounter for antineoplastic chemotherapy: Secondary | ICD-10-CM

## 2020-12-04 DIAGNOSIS — E785 Hyperlipidemia, unspecified: Secondary | ICD-10-CM | POA: Insufficient documentation

## 2020-12-04 DIAGNOSIS — E119 Type 2 diabetes mellitus without complications: Secondary | ICD-10-CM | POA: Insufficient documentation

## 2020-12-04 DIAGNOSIS — Z87891 Personal history of nicotine dependence: Secondary | ICD-10-CM | POA: Insufficient documentation

## 2020-12-04 DIAGNOSIS — M4312 Spondylolisthesis, cervical region: Secondary | ICD-10-CM | POA: Insufficient documentation

## 2020-12-04 DIAGNOSIS — R0602 Shortness of breath: Secondary | ICD-10-CM | POA: Diagnosis not present

## 2020-12-04 LAB — CBC WITH DIFFERENTIAL (CANCER CENTER ONLY)
Abs Immature Granulocytes: 0.03 10*3/uL (ref 0.00–0.07)
Basophils Absolute: 0.1 10*3/uL (ref 0.0–0.1)
Basophils Relative: 1 %
Eosinophils Absolute: 0.1 10*3/uL (ref 0.0–0.5)
Eosinophils Relative: 1 %
HCT: 36.9 % — ABNORMAL LOW (ref 39.0–52.0)
Hemoglobin: 12.1 g/dL — ABNORMAL LOW (ref 13.0–17.0)
Immature Granulocytes: 0 %
Lymphocytes Relative: 20 %
Lymphs Abs: 1.8 10*3/uL (ref 0.7–4.0)
MCH: 30.9 pg (ref 26.0–34.0)
MCHC: 32.8 g/dL (ref 30.0–36.0)
MCV: 94.4 fL (ref 80.0–100.0)
Monocytes Absolute: 0.9 10*3/uL (ref 0.1–1.0)
Monocytes Relative: 10 %
Neutro Abs: 6.1 10*3/uL (ref 1.7–7.7)
Neutrophils Relative %: 68 %
Platelet Count: 368 10*3/uL (ref 150–400)
RBC: 3.91 MIL/uL — ABNORMAL LOW (ref 4.22–5.81)
RDW: 13.2 % (ref 11.5–15.5)
WBC Count: 9 10*3/uL (ref 4.0–10.5)
nRBC: 0 % (ref 0.0–0.2)

## 2020-12-04 LAB — CMP (CANCER CENTER ONLY)
ALT: 11 U/L (ref 0–44)
AST: 12 U/L — ABNORMAL LOW (ref 15–41)
Albumin: 3.1 g/dL — ABNORMAL LOW (ref 3.5–5.0)
Alkaline Phosphatase: 78 U/L (ref 38–126)
Anion gap: 7 (ref 5–15)
BUN: 16 mg/dL (ref 8–23)
CO2: 30 mmol/L (ref 22–32)
Calcium: 9.1 mg/dL (ref 8.9–10.3)
Chloride: 102 mmol/L (ref 98–111)
Creatinine: 1.09 mg/dL (ref 0.61–1.24)
GFR, Estimated: >60 mL/min (ref >60)
Glucose, Bld: 87 mg/dL (ref 70–99)
Potassium: 5.1 mmol/L (ref 3.5–5.1)
Sodium: 139 mmol/L (ref 135–145)
Total Bilirubin: 0.5 mg/dL (ref 0.3–1.2)
Total Protein: 7.4 g/dL (ref 6.5–8.1)

## 2020-12-04 MED ORDER — PROCHLORPERAZINE MALEATE 10 MG PO TABS: 10.0000 mg | ORAL_TABLET | Freq: Four times a day (QID) | ORAL | 0 refills | Status: DC | PRN

## 2020-12-04 NOTE — Progress Notes (Signed)
[facility] [phone]   [phone]  OFFICE PROGRESS NOTE  [name], [place] [address]  DIAGNOSIS: Stage IIIB (T3, N2, M0) non-small cell lung cancer, squamous cell carcinoma presented with large left upper lobe lung mass in addition to left hilar and mediastinal lymphadenopathy diagnosed in May 2022.  PRIOR THERAPY: None  CURRENT THERAPY: A course of concurrent chemotherapy with carboplatin for AUC of 2 and paclitaxel 45 Mg/M2.  First dose December 15, 2020.  INTERVAL HISTORY: Alex Edwards 85 y.o. male returns to the clinic today for follow-up visit accompanied by his wife Velva Harman.  The patient is feeling fine today with no concerning complaints except for the baseline cough with occasional blood-tinged sputum and mild shortness of breath.  He denied having any chest pain or frank hemoptysis.  He denied having any recent weight loss or night sweats.  He has no nausea, vomiting, diarrhea or constipation.  He denied having any headache or visual changes.  He was discharged from the hospital more than 10 days ago.  He was supposed to have a PET scan before this visit but unfortunately it is scheduled to be done tomorrow.  The patient is here today for evaluation and recommendation regarding treatment of his condition.  MEDICAL HISTORY: Past Medical History:  Diagnosis Date   BCE (basal cell epithelioma)    Bruises easily    on hands   Cough    smokers cough or perfurmes   Degeneration macular    right eye   Diabetes mellitus without complication (HCC)    Dyslipidemia    ED (erectile dysfunction)    Hx of adenomatous colonic polyps    Hyperlipidemia    Hypertension    borderline   Obesity    Psoriasis    Seasonal allergies    Smoker    former    ALLERGIES:  is allergic to vancomycin and latex.  MEDICATIONS:  Current Outpatient Medications  Medication Sig Dispense Refill   amLODipine (NORVASC) 5 MG  tablet Take 1 tablet (5 mg total) by mouth daily. 30 tablet 0   Ascorbic Acid (VITAMIN C) 1000 MG tablet Take 1,000 mg by mouth daily.     fexofenadine (ALLEGRA) 180 MG tablet Take 180 mg by mouth daily.     ketoconazole (NIZORAL) 2 % cream APPLY TO THE AFFECTED AREA ONCE A DAY. (Patient taking differently: 1 application daily as needed for irritation.) 60 g 0   losartan (COZAAR) 100 MG tablet Take 1 tablet (100 mg total) by mouth daily. 30 tablet 0   metoprolol tartrate (LOPRESSOR) 50 MG tablet TAKE ONE TABLET BY MOUTH TWICE DAILY. (Patient taking differently: Take 50 mg by mouth 2 (two) times daily.) 180 tablet 0   Multiple Vitamin (MULTI VITAMIN DAILY PO) Take 1 tablet by mouth daily.     Multiple Vitamins-Minerals (PRESERVISION AREDS 2) CAPS Take 1 capsule by mouth in the morning and at bedtime.     [place] Patient is to test one time a day DX: E11.9 100 each 4   ONETOUCH VERIO test strip USE TO TEST BLOOD SUGAR ONCE DAILY. 50 strip 2   tamsulosin (FLOMAX) 0.4 MG CAPS capsule Take 1 capsule (0.4 mg total) by mouth daily after supper. 30 capsule 3   triamcinolone cream (KENALOG) 0.1 % APPLY TO AFFECTED AREA TWICE A DAY. (Patient taking differently: Apply 1 application topically 2 (two) times daily as needed (  itching).) 454 g 0   No current facility-administered medications for this visit.    SURGICAL HISTORY:  Past Surgical History:  Procedure Laterality Date   BRONCHIAL BRUSHINGS  11/18/2020   Procedure: BRONCHIAL BRUSHINGS;  Surgeon: Spero Geralds, MD;  Location: [facility];  Service: Pulmonary;;   BRONCHIAL NEEDLE ASPIRATION BIOPSY  11/18/2020   Procedure: BRONCHIAL NEEDLE ASPIRATION BIOPSIES;  Surgeon: Spero Geralds, MD;  Location: [facility];  Service: Pulmonary;;   BRONCHIAL WASHINGS  11/18/2020   Procedure: BRONCHIAL WASHINGS;  Surgeon: Spero Geralds, MD;  Location: [facility];  Service: Pulmonary;;   CATARACT EXTRACTION, BILATERAL Bilateral 2013    COLONOSCOPY  2005   [facility]   ENDOBRONCHIAL ULTRASOUND N/A 11/18/2020   Procedure: ENDOBRONCHIAL ULTRASOUND;  Surgeon: Spero Geralds, MD;  Location: [facility];  Service: Pulmonary;  Laterality: N/A;   POLYPECTOMY     SKIN CANCER EXCISION Right 1993   under eye-MOHS, freeze multiple places freq   TONSILLECTOMY  as child   TOTAL HIP ARTHROPLASTY Left 01/04/2014   Procedure: LEFT TOTAL HIP ARTHROPLASTY ANTERIOR APPROACH;  Surgeon: Mcarthur Rossetti, MD;  Location: [facility];  Service: Orthopedics;  Laterality: Left;   TOTAL HIP ARTHROPLASTY Left 02/14/2014   Procedure: Irrigation and Debridement left hip;  Surgeon: Mcarthur Rossetti, MD;  Location: [facility];  Service: Orthopedics;  Laterality: Left;    REVIEW OF SYSTEMS:  Constitutional: positive for fatigue Eyes: negative Ears, nose, mouth, throat, and face: negative Respiratory: positive for cough and dyspnea on exertion Cardiovascular: negative Gastrointestinal: negative Genitourinary:negative Integument/breast: negative Hematologic/lymphatic: negative Musculoskeletal:negative Neurological: negative Behavioral/Psych: negative Endocrine: negative Allergic/Immunologic: negative   PHYSICAL EXAMINATION: General appearance: alert, cooperative, fatigued, and no distress Head: Normocephalic, without obvious abnormality, atraumatic Neck: no adenopathy, no JVD, supple, symmetrical, trachea midline, and thyroid not enlarged, symmetric, no tenderness/mass/nodules Lymph nodes: Cervical, supraclavicular, and axillary nodes normal. Resp: diminished breath sounds LUL and dullness to percussion LUL Back: symmetric, no curvature. ROM normal. No CVA tenderness. Cardio: regular rate and rhythm, S1, S2 normal, no murmur, click, rub or gallop GI: soft, non-tender; bowel sounds normal; no masses,  no organomegaly Extremities: extremities normal, atraumatic, no cyanosis or edema Neurologic: Alert and oriented X 3, normal strength and tone.  Normal symmetric reflexes. Normal coordination and gait  ECOG PERFORMANCE STATUS: 1 - Symptomatic but completely ambulatory  Blood pressure (!) 144/57, pulse 86, temperature (!) 97.5 F (36.4 C), temperature source Tympanic, resp. rate 20, height 6\' 1"  (1.854 m), weight 235 lb (106.6 kg), SpO2 96 %.  LABORATORY DATA: Lab Results  Component Value Date   WBC 9.0 12/04/2020   HGB 12.1 (L) 12/04/2020   HCT 36.9 (L) 12/04/2020   MCV 94.4 12/04/2020   PLT 368 12/04/2020      Chemistry      Component Value Date/Time   NA 139 12/04/2020 1210   NA 141 01/29/2020 1157   K 5.1 12/04/2020 1210   CL 102 12/04/2020 1210   CO2 30 12/04/2020 1210   BUN 16 12/04/2020 1210   BUN 12 01/29/2020 1157   CREATININE 1.09 12/04/2020 1210   CREATININE 1.11 07/26/2016 0921      Component Value Date/Time   CALCIUM 9.1 12/04/2020 1210   ALKPHOS 78 12/04/2020 1210   AST 12 (L) 12/04/2020 1210   ALT 11 12/04/2020 1210   BILITOT 0.5 12/04/2020 1210       RADIOGRAPHIC STUDIES: CT SOFT TISSUE NECK WO CONTRAST  Result Date: 11/19/2020 CLINICAL DATA:  Parotid region  mass. Additional history provided: Patient reports lump under left jaw for 30+ years. EXAM: CT NECK WITHOUT CONTRAST TECHNIQUE: Multidetector CT imaging of the neck was performed following the standard protocol without intravenous contrast. COMPARISON:  Chest CT 11/17/2020. FINDINGS: Mildly motion degraded exam. Pharynx and larynx: No appreciable swelling or discrete mass within the oral cavity, pharynx or larynx on this noncontrast examination. Salivary glands: 6.1 x 2.9 cm ovoid slightly heterogeneous soft tissue focus along the inferior aspect of the left parotid gland, extending to the left level 2 station and overlying the anterior aspect of the left sternocleidomastoid muscle. The right parotid and bilateral submandibular glands are unremarkable. Thyroid: Unremarkable. Lymph nodes: Please refer to salivary gland findings above. Elsewhere  within the neck, no pathologically enlarged cervical chain lymph nodes are identified. Vascular: There is limited assessment of the major vascular structures of the neck in the absence of intravenous contrast. Calcified atherosclerotic plaque within the visualized aortic arch, proximal major branch vessels of the neck and carotid arteries. Calcified plaque within the proximal right ICA could result in a hemodynamically significant stenosis. Limited intracranial: No acute intracranial abnormality identified within the field of view. Visualized orbits: Incompletely imaged. No mass or acute finding at the imaged levels. Mastoids and visualized paranasal sinuses: No significant paranasal sinus disease or mastoid effusion at the imaged levels. Skeleton: Reversal of the expected cervical lordosis. Mild C6-C7 and C7-T1 grade 1 anterolisthesis. Cervical spondylosis. No acute bony abnormality or aggressive osseous lesion identified. Upper chest: Incompletely imaged known large left upper lobe lung mass. IMPRESSION: 6.1 x 2.9 cm ovoid soft tissue focus within the left upper neck, along the inferior aspect of the left parotid gland. Favored differential considerations are primary parotid neoplasm versus nodal metastatic disease. Direct tissue sampling should be considered. Elsewhere within the neck, no pathologically enlarged lymph nodes are identified. Partially imaged known left upper lobe pulmonary mass suspicious for bronchogenic carcinoma. Atherosclerotic disease as described. Notably, calcified plaque at the origin of the right ICA could result in a hemodynamically significant stenosis. Carotid artery duplex recommended for further evaluation. Cervical spondylosis. Electronically Signed   By: Kellie Simmering DO   On: 11/19/2020 13:25   CT Chest Wo Contrast  Result Date: 11/17/2020 CLINICAL DATA:  Short of breath. EXAM: CT CHEST WITHOUT CONTRAST TECHNIQUE: Multidetector CT imaging of the chest was performed following the  standard protocol without IV contrast. COMPARISON:  01/03/2014 FINDINGS: Cardiovascular: Heart size is normal. Aortic atherosclerosis. Coronary artery calcifications. No pericardial effusion. Mediastinum/Nodes: Normal appearance of the thyroid gland. The trachea appears patent and is midline. Normal appearance of the esophagus. No axillary or supraclavicular adenopathy. Left paratracheal lymph node measures 1.4 cm, image 59/2. Subcarinal node measures 1.3 cm, image 73/2. The hilar lymph nodes are suboptimally evaluated due to lack of IV contrast material. Lungs/Pleura: Within the left upper lobe there is a large mass measuring 7.3 cm in maximum dimension, image 65/5 and image 90/6. Internal areas of gas within this mass are noted concerning for central necrosis. Surrounding interlobular septal thickening is noted within the left upper lobe concerning for lymphangitic spread of tumor. Scattered areas of tree-in-bud nodularity are identified throughout both lungs most notable within the lower lung zones. Peripheral areas of subpleural consolidation noted within the posterior and lateral right lower lobe. Upper Abdomen: No acute abnormality within the imaged portions of the upper abdomen. Nodule in the left adrenal gland measures 1.9 cm, image 157/2. Unchanged from 2015 compatible with a benign abnormality. Musculoskeletal: No chest wall  mass or suspicious bone lesions identified. IMPRESSION: 1. Large left upper lobe lung mass is identified with internal areas of gas concerning for central necrosis. Findings are worrisome for primary bronchogenic carcinoma. Surrounding interlobular septal thickening is concerning for lymphangitic spread of tumor. Further evaluation with PET-CT and tissue sampling is advised. 2. Enlarged left paratracheal and subcarinal lymph nodes are identified. Cannot rule out metastatic adenopathy. 3. Scattered areas of tree-in-bud nodularity are identified throughout both lungs consistent with  inflammatory or infectious bronchiolitis. 4. Peripheral areas of subpleural consolidation within the posterior and lateral right lower lobe are noted which may represent areas of atelectasis or pneumonia. 5. Aortic atherosclerosis. Coronary artery calcifications. Aortic Atherosclerosis (ICD10-I70.0). Electronically Signed   By: Kerby Moors M.D.   On: 11/17/2020 13:52   MR BRAIN W WO CONTRAST  Result Date: 11/20/2020 CLINICAL DATA:  Lung cancer, staging EXAM: MRI HEAD WITHOUT AND WITH CONTRAST TECHNIQUE: Multiplanar, multiecho pulse sequences of the brain and surrounding structures were obtained without and with intravenous contrast. CONTRAST:  69mL GADAVIST GADOBUTROL 1 MMOL/ML IV SOLN COMPARISON:  None. FINDINGS: Brain: There is no acute infarction or intracranial hemorrhage. There is no intracranial mass, mass effect, or edema. There is no hydrocephalus or extra-axial fluid collection. Ventricles and sulci are within normal limits in size and configuration. Patchy small foci of T2 hyperintensity in the supratentorial white matter are nonspecific but may reflect minor chronic microvascular ischemic changes. No abnormal enhancement. Vascular: Major vessel flow voids at the skull base are preserved. Skull and upper cervical spine: Normal marrow signal is preserved. Sinuses/Orbits: Paranasal sinuses are aerated. Bilateral lens replacements. Other: Sella is partially empty. Mastoid air cells are clear. Partially imaged left neck mass along the inferior parotid better evaluated on recent neck CT. IMPRESSION: No evidence of intracranial metastatic disease. Electronically Signed   By: Macy Mis M.D.   On: 11/20/2020 09:00   DG CHEST PORT 1 VIEW  Result Date: 11/21/2020 CLINICAL DATA:  85 year old male with history of hypoxia. EXAM: PORTABLE CHEST 1 VIEW COMPARISON:  Chest x-ray 11/17/2020. FINDINGS: Large mass in the periphery of the left mid lung, similar to the recent prior study, estimated to measure  approximately 8.8 x 6.3 cm. Areas of interstitial prominence an ill-defined opacities surrounding the lesion and in the left lung base may reflect postobstructive changes and/or lymphangitic spread of tumor as demonstrated on prior chest CT. Small left pleural effusion. Right lung is clear. No right pleural effusion. No pneumothorax. No evidence of pulmonary edema. Heart size is normal. Upper mediastinal contours are within normal limits. Aortic atherosclerosis. IMPRESSION: 1. Large left-sided lung mass previously demonstrated to be in the left upper lobe with surrounding parenchymal changes suggestive of lymphangitic spread of tumor and/or postobstructive changes with small left pleural effusion. 2. Aortic atherosclerosis. Electronically Signed   By: Vinnie Langton M.D.   On: 11/21/2020 14:27   DG Chest Portable 1 View  Result Date: 11/17/2020 CLINICAL DATA:  Worsening cough, congestion, shortness of breath. Bloods tinged sputum. EXAM: PORTABLE CHEST 1 VIEW COMPARISON:  February 14, 2018. FINDINGS: Rounded/masslike 8 cm opacity in the lateral left midlung. Mild diffuse interstitial prominence. No visible pleural effusions or pneumothorax. Similar cardiomediastinal silhouette. IMPRESSION: 1. Rounded/masslike 8 cm opacity in the lateral left midlung. Recommend chest CT to evaluate for possible mass. 2. Diffuse interstitial prominence, possibly chronic bronchitic related change given similar findings on the prior. The recommend chest CT can also better characterize this finding and evaluate for superimposed interstitial edema or atypical  infection. Electronically Signed   By: Margaretha Sheffield MD   On: 11/17/2020 12:05   [place] CORE BIOPSY (SALIVARY GLAND/PAROTID GLAND)  Result Date: 11/20/2020 CLINICAL DATA:  Parotid region mass, left submandibular lump EXAM: ULTRASOUND GUIDED CORE BIOPSY OF LEFT NECK MASS MEDICATIONS: Intravenous Fentanyl 58mcg and Versed 1mg  were administered as conscious sedation during  continuous monitoring of the patient's level of consciousness and physiological / cardiorespiratory status by the radiology RN, with a total moderate sedation time of 20 minutes. PROCEDURE: The procedure, risks, benefits, and alternatives were explained to the patient. Questions regarding the procedure were encouraged and answered. The patient understands and consents to the procedure. Survey ultrasound of the left neck was performed. The complex left neck mass was localized and an appropriate skin entry site was determined and marked. The operative field was prepped with chlorhexidine in a sterile fashion, and a sterile drape was applied covering the operative field. A sterile gown and sterile gloves were used for the procedure. Local anesthesia was provided with 1% Lidocaine. Under real-time ultrasound guidance, a 17 gauge trocar needle was advanced to the margin of the lesion. Once needle tip position was confirmed, coaxial 18-gauge core biopsy samples were obtained, submitted in saline to surgical pathology. The guide needle was removed. Postprocedure scans show no hemorrhage or other apparent complication. The patient tolerated the procedure well. COMPLICATIONS: None. FINDINGS: Complex left neck mass was localized corresponding to CT findings. Representative core biopsy samples obtained as above. IMPRESSION: 1. Technically successful ultrasound-guided core biopsy, left neck mass. Electronically Signed   By: Lucrezia Europe M.D.   On: 11/20/2020 13:47   VAS US CAROTID  Result Date: 11/19/2020 Carotid Arterial Duplex Study Patient Name:  Alex Edwards  Date of Exam:   11/19/2020 Medical Rec #: [id number]        Accession #:    [id number] Date of Birth: 19-Jun-1936         Patient Gender: M Patient Age:   85Y Exam Location:  [facility] Procedure:      VAS US CAROTID Referring Phys: [id number] [place] --------------------------------------------------------------------------------  Indications:        Stenosis. Risk Factors:      Hypertension, hyperlipidemia. Comparison Study:  No prior studies. Performing Technologist: Oliver Hum RVT  Examination Guidelines: A complete evaluation includes B-mode imaging, spectral Doppler, color Doppler, and power Doppler as needed of all accessible portions of each vessel. Bilateral testing is considered an integral part of a complete examination. Limited examinations for reoccurring indications may be performed as noted.  Right Carotid Findings: +----------+--------+--------+--------+--------------------------+--------+           PSV cm/sEDV cm/sStenosisPlaque Description        Comments +----------+--------+--------+--------+--------------------------+--------+ CCA Prox  99      20              smooth and heterogenous            +----------+--------+--------+--------+--------------------------+--------+ CCA Distal64      15              smooth and heterogenous            +----------+--------+--------+--------+--------------------------+--------+ ICA Prox  453     165     80-99%  irregular and heterogenous         +----------+--------+--------+--------+--------------------------+--------+ ICA Mid   267     91      60-79%                                     +----------+--------+--------+--------+--------------------------+--------+  ICA Distal130     54                                        tortuous +----------+--------+--------+--------+--------------------------+--------+ ECA       233     15                                                 +----------+--------+--------+--------+--------------------------+--------+ +----------+--------+-------+--------+-------------------+           PSV cm/sEDV cmsDescribeArm Pressure (mmHG) +----------+--------+-------+--------+-------------------+ Subclavian213                                        +----------+--------+-------+--------+-------------------+  +---------+--------+--+--------+--+---------+ VertebralPSV cm/s58EDV cm/s14Antegrade +---------+--------+--+--------+--+---------+  Left Carotid Findings: +----------+--------+--------+--------+-----------------------+--------+           PSV cm/sEDV cm/sStenosisPlaque Description     Comments +----------+--------+--------+--------+-----------------------+--------+ CCA Prox  129     26              smooth and heterogenous         +----------+--------+--------+--------+-----------------------+--------+ CCA Distal119     28              smooth and heterogenous         +----------+--------+--------+--------+-----------------------+--------+ ICA Prox  133     46      40-59%  smooth and heterogenous         +----------+--------+--------+--------+-----------------------+--------+ ICA Distal199     42                                     tortuous +----------+--------+--------+--------+-----------------------+--------+ ECA       193     12                                              +----------+--------+--------+--------+-----------------------+--------+ +----------+--------+--------+--------+-------------------+           PSV cm/sEDV cm/sDescribeArm Pressure (mmHG) +----------+--------+--------+--------+-------------------+ [id number]                                         +----------+--------+--------+--------+-------------------+ +---------+--------+--+--------+--+---------+ VertebralPSV cm/s59EDV cm/s15Antegrade +---------+--------+--+--------+--+---------+   Summary: Right Carotid: Velocities in the right ICA are consistent with a 80-99%                stenosis. Left Carotid: Velocities in the left ICA are consistent with a 40-59% stenosis. Vertebrals: Bilateral vertebral arteries demonstrate antegrade flow. *See table(s) above for measurements and observations.  Electronically signed by Harold Barban MD on 11/19/2020 at 9:21:07 PM.    Final    DG  C-ARM BRONCHOSCOPY  Result Date: 11/18/2020 C-ARM BRONCHOSCOPY: Fluoroscopy was utilized by the requesting physician.  No radiographic interpretation.    ASSESSMENT AND PLAN: This is a very pleasant 85 years old white male recently diagnosed with a stage IIIb (T3, N2, M0) non-small cell lung cancer, squamous cell carcinoma presented with large left upper lobe lung mass in  addition to left hilar and mediastinal lymphadenopathy diagnosed in May 2022. I had a lengthy discussion with the patient today about his current disease stage, prognosis and treatment options. I recommended for the patient to keep his appointment for the PET scan tomorrow to rule out any other metastatic disease. I discussed with the patient the treatment option for his condition and in the absence of any metastasis, I may consider The patient for a course of concurrent chemoradiation with weekly carboplatin for AUC of 2 and paclitaxel 45 Mg/M2 for 6/7 weeks followed by consolidation immunotherapy if the patient has no evidence for disease progression after the induction phase. I discussed with the patient the adverse effect of the chemotherapy including but not limited to alopecia, myelosuppression, nausea and vomiting, peripheral neuropathy, liver or renal dysfunction. I will refer the patient to radiation oncology for evaluation and discussion of the radiotherapy option. I will call his pharmacy with prescription for Compazine 10 mg p.o. every 6 hours as needed for nausea. Will arrange for the patient to have a chemotherapy education class before the first dose of his treatment. He is expected to start the first dose of his chemotherapy on 12/15/2020. He will come back for follow-up visit on 12/22/2020 for evaluation and management of any adverse effect of his treatment. The patient was advised to call immediately if he has any other concerning symptoms in the interval.  The patient voices understanding of current disease status  and treatment options and is in agreement with the current care plan.  All questions were answered. The patient knows to call the clinic with any problems, questions or concerns. We can certainly see the patient much sooner if necessary. The total time spent in the appointment was 40 minutes.  Disclaimer: This note was dictated with voice recognition software. Similar sounding words can inadvertently be transcribed and may not be corrected upon review.

## 2020-12-04 NOTE — Progress Notes (Signed)

## 2020-12-05 ENCOUNTER — Ambulatory Visit (HOSPITAL_COMMUNITY)
Admission: RE | Admit: 2020-12-05 | Discharge: 2020-12-05 | Disposition: A | Discharge status: Home / Self Care | Payer: Medicare Other | Source: Ambulatory Visit | Attending: Internal Medicine | Admitting: Internal Medicine

## 2020-12-05 DIAGNOSIS — C3490 Malignant neoplasm of unspecified part of unspecified bronchus or lung: Secondary | ICD-10-CM | POA: Diagnosis not present

## 2020-12-05 LAB — GLUCOSE, CAPILLARY: Glucose-Capillary: 100 mg/dL — ABNORMAL HIGH (ref 70–99)

## 2020-12-05 MED ORDER — FLUDEOXYGLUCOSE F - 18 (FDG) INJECTION
11.7700 | Freq: Once | INTRAVENOUS | Status: AC | PRN
  Administered 2020-12-05: 11.77 via INTRAVENOUS

## 2020-12-08 ENCOUNTER — Inpatient Hospital Stay: Payer: Medicare Other

## 2020-12-08 ENCOUNTER — Other Ambulatory Visit: Payer: Self-pay

## 2020-12-08 NOTE — Progress Notes (Signed)
Location of tumor and Histology per Pathology Report: left lung - NSCLC  Biopsy: 11/18/2020        11/20/2020   Past/Anticipated interventions by surgeon, if any:  11/18/2020 Procedure:             Bronchoscopy Indications:           Left upper lobe mass Providers:             Senaida Ores. Shearon Stalls MD  Past/Anticipated interventions by medical oncology, if any: Dr Julien Nordmann     Pain issues, if any:  no   SAFETY ISSUES: Prior radiation? no Pacemaker/ICD? no Possible current pregnancy? no Is the patient on methotrexate? no  Current Complaints / other details:  legs and feet swelling     Vitals:   12/11/20 1420  BP: (!) 135/50  Pulse: 91  Resp: 20  Temp: (!) 97 F (36.1 C)  SpO2: 94%  Weight: 234 lb 9.6 oz (106.4 kg)

## 2020-12-09 ENCOUNTER — Encounter: Payer: Self-pay | Admitting: *Deleted

## 2020-12-09 ENCOUNTER — Telehealth: Payer: Self-pay | Admitting: Internal Medicine

## 2020-12-09 NOTE — Progress Notes (Signed)
I followed up on Alex Edwards schedule and treatment plan. Per Dr. Julien Nordmann, he will be getting concurrent chemo rad.  He had chemo ed, is set up for rad onc this week but no appt for infusion. I reached out to scheduling to get an idea of when he will start. Wait for response.

## 2020-12-09 NOTE — Telephone Encounter (Signed)
Scheduled per los. Called and spoke with patient. Confirmed appt 

## 2020-12-10 ENCOUNTER — Telehealth: Payer: Self-pay | Admitting: *Deleted

## 2020-12-10 NOTE — Telephone Encounter (Signed)
I followed up on [name] schedule. He is set up for his treatment plan at this time. I called him to see if he had any questions regarding his care.  He was getting in the shower so I will call back.

## 2020-12-11 ENCOUNTER — Encounter: Payer: Self-pay | Admitting: Radiation Oncology

## 2020-12-11 ENCOUNTER — Ambulatory Visit
Admission: RE | Admit: 2020-12-11 | Discharge: 2020-12-11 | Disposition: A | Discharge status: Home / Self Care | Payer: Medicare Other | Source: Ambulatory Visit | Attending: Radiation Oncology | Admitting: Radiation Oncology

## 2020-12-11 ENCOUNTER — Telehealth: Payer: Self-pay | Admitting: Physician Assistant

## 2020-12-11 ENCOUNTER — Other Ambulatory Visit: Payer: Self-pay

## 2020-12-11 VITALS — BP 135/50 | HR 91 | Temp 97.0°F | Resp 20 | Wt 234.6 lb

## 2020-12-11 DIAGNOSIS — Z79899 Other long term (current) drug therapy: Secondary | ICD-10-CM | POA: Diagnosis not present

## 2020-12-11 DIAGNOSIS — Z85828 Personal history of other malignant neoplasm of skin: Secondary | ICD-10-CM | POA: Insufficient documentation

## 2020-12-11 DIAGNOSIS — C771 Secondary and unspecified malignant neoplasm of intrathoracic lymph nodes: Secondary | ICD-10-CM | POA: Insufficient documentation

## 2020-12-11 DIAGNOSIS — E785 Hyperlipidemia, unspecified: Secondary | ICD-10-CM | POA: Diagnosis not present

## 2020-12-11 DIAGNOSIS — F17210 Nicotine dependence, cigarettes, uncomplicated: Secondary | ICD-10-CM | POA: Diagnosis not present

## 2020-12-11 DIAGNOSIS — C3412 Malignant neoplasm of upper lobe, left bronchus or lung: Secondary | ICD-10-CM | POA: Diagnosis not present

## 2020-12-11 DIAGNOSIS — I700 Atherosclerosis of aorta: Secondary | ICD-10-CM | POA: Diagnosis not present

## 2020-12-11 DIAGNOSIS — I10 Essential (primary) hypertension: Secondary | ICD-10-CM | POA: Diagnosis not present

## 2020-12-11 DIAGNOSIS — C7972 Secondary malignant neoplasm of left adrenal gland: Secondary | ICD-10-CM | POA: Insufficient documentation

## 2020-12-11 DIAGNOSIS — E119 Type 2 diabetes mellitus without complications: Secondary | ICD-10-CM | POA: Insufficient documentation

## 2020-12-11 DIAGNOSIS — Z87891 Personal history of nicotine dependence: Secondary | ICD-10-CM | POA: Diagnosis not present

## 2020-12-11 DIAGNOSIS — M47812 Spondylosis without myelopathy or radiculopathy, cervical region: Secondary | ICD-10-CM | POA: Diagnosis not present

## 2020-12-11 DIAGNOSIS — C7800 Secondary malignant neoplasm of unspecified lung: Secondary | ICD-10-CM | POA: Insufficient documentation

## 2020-12-11 DIAGNOSIS — C3492 Malignant neoplasm of unspecified part of left bronchus or lung: Secondary | ICD-10-CM

## 2020-12-11 NOTE — Progress Notes (Signed)
See MD note for nursing evaluation. °

## 2020-12-11 NOTE — Progress Notes (Signed)
Radiation Oncology         [phone] ________________________________  Initial Outpatient Consultation  Name: Harris Penton MRN: [id number]  Date: 12/11/2020  DOB: 26-Jan-1936  [id number], Elyse Jarvis, MD  [name], MD   REFERRING PHYSICIAN: [name], MD  DIAGNOSIS: Stage IIIB (T3, N2, M0) non-small cell lung cancer, squamous cell carcinoma presented with large left upper lobe lung mass in addition to left hilar and mediastinal lymphadenopathy diagnosed in May 2022, PET scan however showing stage IV disease with scattered pulmonary nodules and left adrenal gland involvement.  HISTORY OF PRESENT ILLNESS::Alex Edwards is a 85 y.o. male who is accompanied by his wife. he is seen as a courtesy of Dr. Julien Nordmann for an opinion concerning radiation therapy as part of management for his recently diagnosed non-small cell lung cancer. The patient presented to [facility] on 11/17/2020 with complaints of a productive cough with scant hemoptysis for approximately 1 week and associated fever for  several days.  Chest x-ray performed on admission showed a rounded, masslike opacity in the lateral left midlung measuring approximately 8 cm.  This was further evaluated with a chest CT which confirmed a large, 7.3 cm left upper lobe lung mass with internal areas of gas; concerning for central necrosis, and worrisome for primary bronchogenic carcinoma, Findings also showed surrounding interlobular septal thickening concerning for lymphangitic spread of tumor. Additionally, there were enlarged left paratracheal and subcarinal lymph nodes   The patient underwent bronchoscopy for biopsy under Dr. Shearon Stalls on 11/18/2020. Final pathology confirmed non-small cell lung cancer consistent with squamous cell morphology.  The patient also reported a palpable mass in the left jaw that has been present for over 30 years, this was further evaluated with a CT scan of the neck performed on 11/19/2020 that showed a 6.1 x  2.9 cm ovoid soft tissue focus within the left upper neck, along the inferior aspect of the left parotid gland favoring a primary parotid neoplasm versus nodal metastatic disease.  There were no other pathologically enlarged lymph nodes identified within the neck.  The patient was seen by Dr. Julien Nordmann on 11/19/2020 who recommended completing a brain MRI and PET scan to determine the extent of his disease. MRI taken on 11/20/20 showed no evidence of intracranial metastatic disease.    The patient additionally received a biopsy on 11/20/20 of the palpable neck mass (left neck lymph node). 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   On 11/21/20, the patient received a chest x-ray showing the previously demonstrated large left-sided lung mass of the left upper lobe to be suggestive of lymphangitic spread of tumor, and/or postobstructive changes with small left pleural effusion.  Most recently, the patient received a PET scan on 12/05/20  tor rule out any other metastatic disease. Results showed the 8 cm hypermetabolic mass in the left upper lobe consistent with known neoplasm. A second smaller more peripheral lesion was seen in the anterior left upper lobe; also described as hypermetabolic and could be of metastatic focus or second primary. Small scattered pulmonary nodules also seen were noted to be suspicious for pulmonary metastatic disease as well as a small left adrenal gland nodule that is hypermetabolic and suspicious for a metastasis. 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  The patient is expected to start the first dose of his chemotherapy on 12/15/2020.   PREVIOUS RADIATION THERAPY: No  PAST MEDICAL  HISTORY:  Past Medical History:  Diagnosis Date   BCE (basal cell epithelioma)    Bruises easily    on hands   Cough    smokers cough or perfurmes    Degeneration macular    right eye   Diabetes mellitus without complication (HCC)    Dyslipidemia    ED (erectile dysfunction)    Hx of adenomatous colonic polyps    Hyperlipidemia    Hypertension    borderline   Obesity    Psoriasis    Seasonal allergies    Smoker    former    PAST SURGICAL HISTORY: Past Surgical History:  Procedure Laterality Date   BRONCHIAL BRUSHINGS  11/18/2020   Procedure: BRONCHIAL BRUSHINGS;  Surgeon: Spero Geralds, MD;  Location: [facility];  Service: Pulmonary;;   BRONCHIAL NEEDLE ASPIRATION BIOPSY  11/18/2020   Procedure: BRONCHIAL NEEDLE ASPIRATION BIOPSIES;  Surgeon: Spero Geralds, MD;  Location: [facility];  Service: Pulmonary;;   BRONCHIAL WASHINGS  11/18/2020   Procedure: BRONCHIAL WASHINGS;  Surgeon: Spero Geralds, MD;  Location: [facility];  Service: Pulmonary;;   CATARACT EXTRACTION, BILATERAL Bilateral 2013   COLONOSCOPY  2005   [facility]   ENDOBRONCHIAL ULTRASOUND N/A 11/18/2020   Procedure: ENDOBRONCHIAL ULTRASOUND;  Surgeon: Spero Geralds, MD;  Location: [facility];  Service: Pulmonary;  Laterality: N/A;   POLYPECTOMY     SKIN CANCER EXCISION Right 1993   under eye-MOHS, freeze multiple places freq   TONSILLECTOMY  as child   TOTAL HIP ARTHROPLASTY Left 01/04/2014   Procedure: LEFT TOTAL HIP ARTHROPLASTY ANTERIOR APPROACH;  Surgeon: Mcarthur Rossetti, MD;  Location: [facility];  Service: Orthopedics;  Laterality: Left;   TOTAL HIP ARTHROPLASTY Left 02/14/2014   Procedure: Irrigation and Debridement left hip;  Surgeon: Mcarthur Rossetti, MD;  Location: [facility];  Service: Orthopedics;  Laterality: Left;    FAMILY HISTORY:  Family History  Problem Relation Age of Onset   Heart disease Brother     SOCIAL HISTORY:  Social History   Tobacco Use   Smoking status: Some Days    Packs/day: 1.00    Years: 50.00    Pack years: 50.00    Types: Cigarettes    Last attempt to quit: 04/28/2014    Years since quitting: 6.6    Smokeless tobacco: Never  Substance Use Topics   Alcohol use: No    Alcohol/week: 0.0 standard drinks   Drug use: No    ALLERGIES:  Allergies  Allergen Reactions   Vancomycin     Red Man Syndrome    Latex Other (See Comments)    "possibly allergic- dry, itching, rash"    MEDICATIONS:  Current Outpatient Medications  Medication Sig Dispense Refill   amLODipine (NORVASC) 5 MG tablet Take 1 tablet (5 mg total) by mouth daily. 30 tablet 0   Ascorbic Acid (VITAMIN C) 1000 MG tablet Take 1,000 mg by mouth daily.     fexofenadine (ALLEGRA) 180 MG tablet Take 180 mg by mouth daily.     ketoconazole (NIZORAL) 2 % cream APPLY TO THE AFFECTED AREA ONCE A DAY. (Patient taking differently: 1 application daily as needed for irritation.) 60 g 0   losartan (COZAAR) 100 MG tablet Take 1 tablet (100 mg total) by mouth daily. 30 tablet 0   metoprolol tartrate (LOPRESSOR) 50 MG tablet TAKE ONE TABLET BY MOUTH TWICE DAILY. (Patient taking differently: Take 50 mg by mouth 2 (two) times daily.) 180 tablet 0   Multiple  Vitamin (MULTI VITAMIN DAILY PO) Take 1 tablet by mouth daily.     Multiple Vitamins-Minerals (PRESERVISION AREDS 2) CAPS Take 1 capsule by mouth in the morning and at bedtime.     Wise Patient is to test one time a day DX: E11.9 100 each 4   ONETOUCH VERIO test strip USE TO TEST BLOOD SUGAR ONCE DAILY. 50 strip 2   tamsulosin (FLOMAX) 0.4 MG CAPS capsule Take 1 capsule (0.4 mg total) by mouth daily after supper. 30 capsule 3   triamcinolone cream (KENALOG) 0.1 % APPLY TO AFFECTED AREA TWICE A DAY. (Patient taking differently: Apply 1 application topically 2 (two) times daily as needed (itching).) 454 g 0   prochlorperazine (COMPAZINE) 10 MG tablet Take 1 tablet (10 mg total) by mouth every 6 (six) hours as needed for nausea or vomiting. (Patient not taking: Reported on 12/11/2020) 30 tablet 0   No current facility-administered medications for this encounter.     REVIEW OF SYSTEMS:  A 10+ POINT REVIEW OF SYSTEMS WAS OBTAINED including neurology, dermatology, psychiatry, cardiac, respiratory, lymph, extremities, GI, GU, musculoskeletal, constitutional, reproductive, HEENT.  The patient denies any pain along the left lateral chest wall area, significant cough or hemoptysis.  He occasionally will have blood-tinged sputum early in the morning.   PHYSICAL EXAM:  weight is 234 lb 9.6 oz (106.4 kg). His temperature is 97 F (36.1 C) (abnormal). His blood pressure is 135/50 (abnormal) and his pulse is 91. His respiration is 20 and oxygen saturation is 94%.   General: Alert and oriented, in no acute distress HEENT: Head is normocephalic. Extraocular movements are intact.  Neck: Neck is supple, no palpable cervical or supraclavicular lymphadenopathy. Heart: Regular in rate and rhythm with no murmurs, rubs, or gallops. Chest: Clear to auscultation bilaterally, with no rhonchi, wheezes, or rales. Abdomen: Soft, nontender, nondistended, with no rigidity or guarding. Extremities: No cyanosis or edema. Lymphatics: see Neck Exam Skin: No concerning lesions. Musculoskeletal: symmetric strength and muscle tone throughout. Neurologic: Cranial nerves II through XII are grossly intact. No obvious focalities. Speech is fluent. Coordination is intact. Psychiatric: Judgment and insight are intact. Affect is appropriate.   ECOG = 1  0 - Asymptomatic (Fully active, able to carry on all predisease activities without restriction)  1 - Symptomatic but completely ambulatory (Restricted in physically strenuous activity but ambulatory and able to carry out work of a light or sedentary nature. For example, light housework, office work)  2 - Symptomatic, <50% in bed during the day (Ambulatory and capable of all self care but unable to carry out any work activities. Up and about more than 50% of waking hours)  3 - Symptomatic, >50% in bed, but not bedbound (Capable of only  limited self-care, confined to bed or chair 50% or more of waking hours)  4 - Bedbound (Completely disabled. Cannot carry on any self-care. Totally confined to bed or chair)  5 - Death   Eustace Pen MM, Creech RH, Tormey DC, et al. [phone]). "Toxicity and response criteria of the [facility]". [place] Oncol. 5 (6): 649-55  LABORATORY DATA:  Lab Results  Component Value Date   WBC 9.0 12/04/2020   HGB 12.1 (L) 12/04/2020   HCT 36.9 (L) 12/04/2020   MCV 94.4 12/04/2020   PLT 368 12/04/2020   NEUTROABS 6.1 12/04/2020   Lab Results  Component Value Date   NA 139 12/04/2020   K 5.1 12/04/2020   CL 102 12/04/2020  CO2 30 12/04/2020   GLUCOSE 87 12/04/2020   CREATININE 1.09 12/04/2020   CALCIUM 9.1 12/04/2020      RADIOGRAPHY: CT SOFT TISSUE NECK WO CONTRAST  Result Date: 11/19/2020 CLINICAL DATA:  Parotid region mass. Additional history provided: Patient reports lump under left jaw for 30+ years. EXAM: CT NECK WITHOUT CONTRAST TECHNIQUE: Multidetector CT imaging of the neck was performed following the standard protocol without intravenous contrast. COMPARISON:  Chest CT 11/17/2020. FINDINGS: Mildly motion degraded exam. Pharynx and larynx: No appreciable swelling or discrete mass within the oral cavity, pharynx or larynx on this noncontrast examination. Salivary glands: 6.1 x 2.9 cm ovoid slightly heterogeneous soft tissue focus along the inferior aspect of the left parotid gland, extending to the left level 2 station and overlying the anterior aspect of the left sternocleidomastoid muscle. The right parotid and bilateral submandibular glands are unremarkable. Thyroid: Unremarkable. Lymph nodes: Please refer to salivary gland findings above. Elsewhere within the neck, no pathologically enlarged cervical chain lymph nodes are identified. Vascular: There is limited assessment of the major vascular structures of the neck in the absence of intravenous contrast. Calcified  atherosclerotic plaque within the visualized aortic arch, proximal major branch vessels of the neck and carotid arteries. Calcified plaque within the proximal right ICA could result in a hemodynamically significant stenosis. Limited intracranial: No acute intracranial abnormality identified within the field of view. Visualized orbits: Incompletely imaged. No mass or acute finding at the imaged levels. Mastoids and visualized paranasal sinuses: No significant paranasal sinus disease or mastoid effusion at the imaged levels. Skeleton: Reversal of the expected cervical lordosis. Mild C6-C7 and C7-T1 grade 1 anterolisthesis. Cervical spondylosis. No acute bony abnormality or aggressive osseous lesion identified. Upper chest: Incompletely imaged known large left upper lobe lung mass. IMPRESSION: 6.1 x 2.9 cm ovoid soft tissue focus within the left upper neck, along the inferior aspect of the left parotid gland. Favored differential considerations are primary parotid neoplasm versus nodal metastatic disease. Direct tissue sampling should be considered. Elsewhere within the neck, no pathologically enlarged lymph nodes are identified. Partially imaged known left upper lobe pulmonary mass suspicious for bronchogenic carcinoma. Atherosclerotic disease as described. Notably, calcified plaque at the origin of the right ICA could result in a hemodynamically significant stenosis. Carotid artery duplex recommended for further evaluation. Cervical spondylosis. Electronically Signed   By: Kellie Simmering DO   On: 11/19/2020 13:25   CT Chest Wo Contrast  Result Date: 11/17/2020 CLINICAL DATA:  Short of breath. EXAM: CT CHEST WITHOUT CONTRAST TECHNIQUE: Multidetector CT imaging of the chest was performed following the standard protocol without IV contrast. COMPARISON:  01/03/2014 FINDINGS: Cardiovascular: Heart size is normal. Aortic atherosclerosis. Coronary artery calcifications. No pericardial effusion. Mediastinum/Nodes: Normal  appearance of the thyroid gland. The trachea appears patent and is midline. Normal appearance of the esophagus. No axillary or supraclavicular adenopathy. Left paratracheal lymph node measures 1.4 cm, image 59/2. Subcarinal node measures 1.3 cm, image 73/2. The hilar lymph nodes are suboptimally evaluated due to lack of IV contrast material. Lungs/Pleura: Within the left upper lobe there is a large mass measuring 7.3 cm in maximum dimension, image 65/5 and image 90/6. Internal areas of gas within this mass are noted concerning for central necrosis. Surrounding interlobular septal thickening is noted within the left upper lobe concerning for lymphangitic spread of tumor. Scattered areas of tree-in-bud nodularity are identified throughout both lungs most notable within the lower lung zones. Peripheral areas of subpleural consolidation noted within the posterior and lateral  right lower lobe. Upper Abdomen: No acute abnormality within the imaged portions of the upper abdomen. Nodule in the left adrenal gland measures 1.9 cm, image 157/2. Unchanged from 2015 compatible with a benign abnormality. Musculoskeletal: No chest wall mass or suspicious bone lesions identified. IMPRESSION: 1. Large left upper lobe lung mass is identified with internal areas of gas concerning for central necrosis. Findings are worrisome for primary bronchogenic carcinoma. Surrounding interlobular septal thickening is concerning for lymphangitic spread of tumor. Further evaluation with PET-CT and tissue sampling is advised. 2. Enlarged left paratracheal and subcarinal lymph nodes are identified. Cannot rule out metastatic adenopathy. 3. Scattered areas of tree-in-bud nodularity are identified throughout both lungs consistent with inflammatory or infectious bronchiolitis. 4. Peripheral areas of subpleural consolidation within the posterior and lateral right lower lobe are noted which may represent areas of atelectasis or pneumonia. 5. Aortic  atherosclerosis. Coronary artery calcifications. Aortic Atherosclerosis (ICD10-I70.0). Electronically Signed   By: Kerby Moors M.D.   On: 11/17/2020 13:52   MR BRAIN W WO CONTRAST  Result Date: 11/20/2020 CLINICAL DATA:  Lung cancer, staging EXAM: MRI HEAD WITHOUT AND WITH CONTRAST TECHNIQUE: Multiplanar, multiecho pulse sequences of the brain and surrounding structures were obtained without and with intravenous contrast. CONTRAST:  68mL GADAVIST GADOBUTROL 1 MMOL/ML IV SOLN COMPARISON:  None. FINDINGS: Brain: There is no acute infarction or intracranial hemorrhage. There is no intracranial mass, mass effect, or edema. There is no hydrocephalus or extra-axial fluid collection. Ventricles and sulci are within normal limits in size and configuration. Patchy small foci of T2 hyperintensity in the supratentorial white matter are nonspecific but may reflect minor chronic microvascular ischemic changes. No abnormal enhancement. Vascular: Major vessel flow voids at the skull base are preserved. Skull and upper cervical spine: Normal marrow signal is preserved. Sinuses/Orbits: Paranasal sinuses are aerated. Bilateral lens replacements. Other: Sella is partially empty. Mastoid air cells are clear. Partially imaged left neck mass along the inferior parotid better evaluated on recent neck CT. IMPRESSION: No evidence of intracranial metastatic disease. Electronically Signed   By: Macy Mis M.D.   On: 11/20/2020 09:00   NM PET Image Initial (PI) Skull Base To Thigh  Result Date: 12/06/2020 CLINICAL DATA:  Initial treatment strategy for non-small cell lung cancer. EXAM: NUCLEAR MEDICINE PET SKULL BASE TO THIGH TECHNIQUE: 11.77 mCi F-18 FDG was injected intravenously. Full-ring PET imaging was performed from the skull base to thigh after the radiotracer. CT data was obtained and used for attenuation correction and anatomic localization. Fasting blood glucose: 100 mg/dl COMPARISON:  Chest CT 11/17/2020 and neck CT  11/19/2020 FINDINGS: Mediastinal blood pool activity: SUV max 2.16 Liver activity: SUV max NA NECK: The large parotid mass seen on recent neck CT is hypermetabolic with SUV max of 23.55. No associated neck adenopathy. 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 Recommend biopsy. Incidental CT findings: none CHEST: 8 cm left upper lobe lung mass is hypermetabolic with SUV max of 73.22. 14 mm left paratracheal/AP window node is hypermetabolic with SUV max of 0.25. There is also a focus of hypermetabolism in the right paratracheal area at the level of the carina with SUV max of 4.06. I do not see a definite lymph node in this area. Small subcarinal node is not hypermetabolic. 10 mm left infrahilar node demonstrates mild hypermetabolism with SUV max of 3.96. The peripheral left upper lobe pulmonary lesion anteriorly measures 2.6 cm on image 40/8. This is hypermetabolic with SUV max of 4.27 and could be a  metastatic focus or second primary. Small scattered pulmonary nodules are suspicious for pulmonary metastatic disease. No definite hypermetabolism these are quite small. No supraclavicular or axillary adenopathy. Incidental CT findings: none ABDOMEN/PELVIS: Left adrenal gland nodule measuring 15 mm is mildly hypermetabolic with SUV max of 3.73 and suspicious for small metastasis. No hepatic lesions are identified. No abdominal or pelvic hypermetabolic lymphadenopathy. Incidental CT findings: Moderate aortic calcifications without focal aneurysm. Sigmoid colon diverticulosis. Enlarged prostate gland with median lobe hypertrophy impressing on the base of the bladder. Bilateral inguinal hernias containing fat. SKELETON: No findings suspicious for osseous metastatic disease. Incidental CT findings: none IMPRESSION: 1. 8 cm hypermetabolic mass in the left upper lobe consistent with known neoplasm. 2. Second smaller more peripheral lesion anterior left upper lobe is also hypermetabolic and could be a metastatic focus or  second primary. 3. Small scattered pulmonary nodules suspicious for pulmonary metastatic disease. 4. Left hilar and mediastinal metastatic adenopathy. 5. Small left adrenal gland nodule is hypermetabolic and suspicious for a metastasis. 6. 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 7. No findings for osseous metastatic disease. Electronically Signed   By: Marijo Sanes M.D.   On: 12/06/2020 19:02   DG CHEST PORT 1 VIEW  Result Date: 11/21/2020 CLINICAL DATA:  85 year old male with history of hypoxia. EXAM: PORTABLE CHEST 1 VIEW COMPARISON:  Chest x-ray 11/17/2020. FINDINGS: Large mass in the periphery of the left mid lung, similar to the recent prior study, estimated to measure approximately 8.8 x 6.3 cm. Areas of interstitial prominence an ill-defined opacities surrounding the lesion and in the left lung base may reflect postobstructive changes and/or lymphangitic spread of tumor as demonstrated on prior chest CT. Small left pleural effusion. Right lung is clear. No right pleural effusion. No pneumothorax. No evidence of pulmonary edema. Heart size is normal. Upper mediastinal contours are within normal limits. Aortic atherosclerosis. IMPRESSION: 1. Large left-sided lung mass previously demonstrated to be in the left upper lobe with surrounding parenchymal changes suggestive of lymphangitic spread of tumor and/or postobstructive changes with small left pleural effusion. 2. Aortic atherosclerosis. Electronically Signed   By: Vinnie Langton M.D.   On: 11/21/2020 14:27   DG Chest Portable 1 View  Result Date: 11/17/2020 CLINICAL DATA:  Worsening cough, congestion, shortness of breath. Bloods tinged sputum. EXAM: PORTABLE CHEST 1 VIEW COMPARISON:  February 14, 2018. FINDINGS: Rounded/masslike 8 cm opacity in the lateral left midlung. Mild diffuse interstitial prominence. No visible pleural effusions or pneumothorax. Similar cardiomediastinal silhouette.  IMPRESSION: 1. Rounded/masslike 8 cm opacity in the lateral left midlung. Recommend chest CT to evaluate for possible mass. 2. Diffuse interstitial prominence, possibly chronic bronchitic related change given similar findings on the prior. The recommend chest CT can also better characterize this finding and evaluate for superimposed interstitial edema or atypical infection. Electronically Signed   By: Margaretha Sheffield MD   On: 11/17/2020 12:05   [place] CORE BIOPSY (SALIVARY GLAND/PAROTID GLAND)  Result Date: 11/20/2020 CLINICAL DATA:  Parotid region mass, left submandibular lump EXAM: ULTRASOUND GUIDED CORE BIOPSY OF LEFT NECK MASS MEDICATIONS: Intravenous Fentanyl 30mcg and Versed 1mg  were administered as conscious sedation during continuous monitoring of the patient's level of consciousness and physiological / cardiorespiratory status by the radiology RN, with a total moderate sedation time of 20 minutes. PROCEDURE: The procedure, risks, benefits, and alternatives were explained to the patient. Questions regarding the procedure were encouraged and answered. The patient understands and consents to the  procedure. Survey ultrasound of the left neck was performed. The complex left neck mass was localized and an appropriate skin entry site was determined and marked. The operative field was prepped with chlorhexidine in a sterile fashion, and a sterile drape was applied covering the operative field. A sterile gown and sterile gloves were used for the procedure. Local anesthesia was provided with 1% Lidocaine. Under real-time ultrasound guidance, a 17 gauge trocar needle was advanced to the margin of the lesion. Once needle tip position was confirmed, coaxial 18-gauge core biopsy samples were obtained, submitted in saline to surgical pathology. The guide needle was removed. Postprocedure scans show no hemorrhage or other apparent complication. The patient tolerated the procedure well. COMPLICATIONS: None. FINDINGS:  Complex left neck mass was localized corresponding to CT findings. Representative core biopsy samples obtained as above. IMPRESSION: 1. Technically successful ultrasound-guided core biopsy, left neck mass. Electronically Signed   By: Lucrezia Europe M.D.   On: 11/20/2020 13:47   VAS US CAROTID  Result Date: 11/19/2020 Carotid Arterial Duplex Study Patient Name:  GRACIN MCPARTLAND Edwards  Date of Exam:   11/19/2020 Medical Rec #: [id number]        Accession #:    [id number] Date of Birth: 1935/10/23         Patient Gender: M Patient Age:   85Y Exam Location:  [facility] Procedure:      VAS US CAROTID Referring Phys: [id number] [place] --------------------------------------------------------------------------------  Indications:       Stenosis. Risk Factors:      Hypertension, hyperlipidemia. Comparison Study:  No prior studies. Performing Technologist: Oliver Hum RVT  Examination Guidelines: A complete evaluation includes B-mode imaging, spectral Doppler, color Doppler, and power Doppler as needed of all accessible portions of each vessel. Bilateral testing is considered an integral part of a complete examination. Limited examinations for reoccurring indications may be performed as noted.  Right Carotid Findings: +----------+--------+--------+--------+--------------------------+--------+           PSV cm/sEDV cm/sStenosisPlaque Description        Comments +----------+--------+--------+--------+--------------------------+--------+ CCA Prox  99      20              smooth and heterogenous            +----------+--------+--------+--------+--------------------------+--------+ CCA Distal64      15              smooth and heterogenous            +----------+--------+--------+--------+--------------------------+--------+ ICA Prox  453     165     80-99%  irregular and heterogenous         +----------+--------+--------+--------+--------------------------+--------+ ICA Mid   267     91       60-79%                                     +----------+--------+--------+--------+--------------------------+--------+ ICA Distal130     54                                        tortuous +----------+--------+--------+--------+--------------------------+--------+ ECA       233     15                                                 +----------+--------+--------+--------+--------------------------+--------+ +----------+--------+-------+--------+-------------------+  PSV cm/sEDV cmsDescribeArm Pressure (mmHG) +----------+--------+-------+--------+-------------------+ Subclavian213                                        +----------+--------+-------+--------+-------------------+ +---------+--------+--+--------+--+---------+ VertebralPSV cm/s58EDV cm/s14Antegrade +---------+--------+--+--------+--+---------+  Left Carotid Findings: +----------+--------+--------+--------+-----------------------+--------+           PSV cm/sEDV cm/sStenosisPlaque Description     Comments +----------+--------+--------+--------+-----------------------+--------+ CCA Prox  129     26              smooth and heterogenous         +----------+--------+--------+--------+-----------------------+--------+ CCA Distal119     28              smooth and heterogenous         +----------+--------+--------+--------+-----------------------+--------+ ICA Prox  133     46      40-59%  smooth and heterogenous         +----------+--------+--------+--------+-----------------------+--------+ ICA Distal199     42                                     tortuous +----------+--------+--------+--------+-----------------------+--------+ ECA       193     12                                              +----------+--------+--------+--------+-----------------------+--------+ +----------+--------+--------+--------+-------------------+           PSV cm/sEDV cm/sDescribeArm Pressure  (mmHG) +----------+--------+--------+--------+-------------------+ [id number]                                         +----------+--------+--------+--------+-------------------+ +---------+--------+--+--------+--+---------+ VertebralPSV cm/s59EDV cm/s15Antegrade +---------+--------+--+--------+--+---------+   Summary: Right Carotid: Velocities in the right ICA are consistent with a 80-99%                stenosis. Left Carotid: Velocities in the left ICA are consistent with a 40-59% stenosis. Vertebrals: Bilateral vertebral arteries demonstrate antegrade flow. *See table(s) above for measurements and observations.  Electronically signed by Harold Barban MD on 11/19/2020 at 9:21:07 PM.    Final    DG C-ARM BRONCHOSCOPY  Result Date: 11/18/2020 C-ARM BRONCHOSCOPY: Fluoroscopy was utilized by the requesting physician.  No radiographic interpretation.      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  Recent PET scan showed is scattered pulmonary nodules suspicious for metastatic disease as well as left adrenal gland involvement.  In reviewing the patient's systems he does not have any significant chest pain significant cough or hemoptysis to warrant any palliative radiation therapy.  Therefore I would not recommend radiation therapy at this time but would reconsider in the future if he develops symptoms to warrant palliative radiation therapy  I discussed these issues with Dr. Worthy Flank PA this afternoon and they will see the patient on Monday to discuss full dose chemotherapy.  PLAN: As needed follow-up in radiation oncology   60 minutes of total time was spent for this patient encounter, including preparation, face-to-face counseling with the patient and coordination of care, physical exam, and documentation of the encounter.   ------------------------------------------------  Blair Promise, PhD, MD  This document serves as a  record of services personally  performed by [name], MD. It was created on his behalf by Roney Mans, a trained medical scribe. The creation of this record is based on the scribe's personal observations and the provider's statements to them. This document has been checked and approved by the attending provider.

## 2020-12-11 NOTE — Telephone Encounter (Signed)
Sch per 6/16 sch msg, pt aware

## 2020-12-12 ENCOUNTER — Other Ambulatory Visit: Payer: Self-pay | Admitting: Internal Medicine

## 2020-12-12 ENCOUNTER — Inpatient Hospital Stay: Payer: Medicare Other

## 2020-12-12 DIAGNOSIS — R0602 Shortness of breath: Secondary | ICD-10-CM | POA: Diagnosis not present

## 2020-12-12 DIAGNOSIS — C3492 Malignant neoplasm of unspecified part of left bronchus or lung: Secondary | ICD-10-CM

## 2020-12-12 DIAGNOSIS — I10 Essential (primary) hypertension: Secondary | ICD-10-CM | POA: Diagnosis not present

## 2020-12-12 DIAGNOSIS — E785 Hyperlipidemia, unspecified: Secondary | ICD-10-CM | POA: Diagnosis not present

## 2020-12-12 DIAGNOSIS — Z87891 Personal history of nicotine dependence: Secondary | ICD-10-CM | POA: Diagnosis not present

## 2020-12-12 DIAGNOSIS — C3412 Malignant neoplasm of upper lobe, left bronchus or lung: Secondary | ICD-10-CM | POA: Diagnosis not present

## 2020-12-12 DIAGNOSIS — E119 Type 2 diabetes mellitus without complications: Secondary | ICD-10-CM | POA: Diagnosis not present

## 2020-12-12 DIAGNOSIS — Z79899 Other long term (current) drug therapy: Secondary | ICD-10-CM | POA: Diagnosis not present

## 2020-12-12 DIAGNOSIS — M4312 Spondylolisthesis, cervical region: Secondary | ICD-10-CM | POA: Diagnosis not present

## 2020-12-12 DIAGNOSIS — I700 Atherosclerosis of aorta: Secondary | ICD-10-CM | POA: Diagnosis not present

## 2020-12-12 DIAGNOSIS — Z86010 Personal history of colonic polyps: Secondary | ICD-10-CM | POA: Diagnosis not present

## 2020-12-12 DIAGNOSIS — Z85828 Personal history of other malignant neoplasm of skin: Secondary | ICD-10-CM | POA: Diagnosis not present

## 2020-12-12 DIAGNOSIS — K5730 Diverticulosis of large intestine without perforation or abscess without bleeding: Secondary | ICD-10-CM | POA: Diagnosis not present

## 2020-12-12 DIAGNOSIS — R590 Localized enlarged lymph nodes: Secondary | ICD-10-CM | POA: Diagnosis not present

## 2020-12-12 DIAGNOSIS — Z5189 Encounter for other specified aftercare: Secondary | ICD-10-CM | POA: Diagnosis not present

## 2020-12-12 DIAGNOSIS — R5383 Other fatigue: Secondary | ICD-10-CM | POA: Diagnosis not present

## 2020-12-12 DIAGNOSIS — C7972 Secondary malignant neoplasm of left adrenal gland: Secondary | ICD-10-CM | POA: Diagnosis not present

## 2020-12-12 LAB — CMP (CANCER CENTER ONLY)
ALT: 11 U/L (ref 0–44)
AST: 14 U/L — ABNORMAL LOW (ref 15–41)
Albumin: 3.3 g/dL — ABNORMAL LOW (ref 3.5–5.0)
Alkaline Phosphatase: 73 U/L (ref 38–126)
Anion gap: 8 (ref 5–15)
BUN: 22 mg/dL (ref 8–23)
CO2: 27 mmol/L (ref 22–32)
Calcium: 8.6 mg/dL — ABNORMAL LOW (ref 8.9–10.3)
Chloride: 103 mmol/L (ref 98–111)
Creatinine: 1.31 mg/dL — ABNORMAL HIGH (ref 0.61–1.24)
GFR, Estimated: 54 mL/min — ABNORMAL LOW (ref >60)
Glucose, Bld: 151 mg/dL — ABNORMAL HIGH (ref 70–99)
Potassium: 4.8 mmol/L (ref 3.5–5.1)
Sodium: 138 mmol/L (ref 135–145)
Total Bilirubin: 0.4 mg/dL (ref 0.3–1.2)
Total Protein: 6.7 g/dL (ref 6.5–8.1)

## 2020-12-12 LAB — CBC WITH DIFFERENTIAL (CANCER CENTER ONLY)
Abs Immature Granulocytes: 0.03 10*3/uL (ref 0.00–0.07)
Basophils Absolute: 0 10*3/uL (ref 0.0–0.1)
Basophils Relative: 0 %
Eosinophils Absolute: 0.2 10*3/uL (ref 0.0–0.5)
Eosinophils Relative: 3 %
HCT: 34.5 % — ABNORMAL LOW (ref 39.0–52.0)
Hemoglobin: 11.3 g/dL — ABNORMAL LOW (ref 13.0–17.0)
Immature Granulocytes: 0 %
Lymphocytes Relative: 20 %
Lymphs Abs: 1.4 10*3/uL (ref 0.7–4.0)
MCH: 30.4 pg (ref 26.0–34.0)
MCHC: 32.8 g/dL (ref 30.0–36.0)
MCV: 92.7 fL (ref 80.0–100.0)
Monocytes Absolute: 0.7 10*3/uL (ref 0.1–1.0)
Monocytes Relative: 10 %
Neutro Abs: 4.5 10*3/uL (ref 1.7–7.7)
Neutrophils Relative %: 67 %
Platelet Count: 273 10*3/uL (ref 150–400)
RBC: 3.72 MIL/uL — ABNORMAL LOW (ref 4.22–5.81)
RDW: 13.1 % (ref 11.5–15.5)
WBC Count: 6.9 10*3/uL (ref 4.0–10.5)
nRBC: 0 % (ref 0.0–0.2)

## 2020-12-12 MED FILL — Dexamethasone Sodium Phosphate Inj 100 MG/10ML: INTRAMUSCULAR | Qty: 2 | Status: AC

## 2020-12-12 NOTE — Progress Notes (Signed)
DISCONTINUE ON PATHWAY REGIMEN - Non-Small Cell Lung     Administer weekly:     Paclitaxel      Carboplatin   **Always confirm dose/schedule in your pharmacy ordering system**  REASON: Other Reason PRIOR TREATMENT: [id number]: Carboplatin AUC=2 + Paclitaxel 45 mg/m2 Weekly During Radiation TREATMENT RESPONSE: Unable to Evaluate  START ON PATHWAY REGIMEN - Non-Small Cell Lung     A cycle is every 21 days:     Pembrolizumab      Paclitaxel      Carboplatin   **Always confirm dose/schedule in your pharmacy ordering system**  Patient Characteristics: Stage IV Metastatic, Squamous, Molecular Analysis Not Elected, PS = 0, 1, Initial Chemotherapy/Immunotherapy, Candidate for Immunotherapy, PD-L1 Expression Positive 1-49% (TPS) / Negative / Not Tested / Awaiting Test Results and Immunotherapy Candidate Therapeutic Status: Stage IV Metastatic Histology: Squamous Cell ECOG Performance Status: 1 Chemotherapy/Immunotherapy Line of Therapy: Initial Chemotherapy/Immunotherapy Immunotherapy Candidate Status: Candidate for Immunotherapy PD-L1 Expression Status: Quantity Not Sufficient Intent of Therapy: Non-Curative / Palliative Intent, Discussed with Patient

## 2020-12-12 NOTE — Progress Notes (Signed)
[place] OFFICE PROGRESS NOTE  [name], MD [place] [address]  DIAGNOSIS: Stage IV (T3, N2, M1c) small cell lung cancer, squamous cell carcinoma.  He presented with a large left upper lobe lung mass in addition to left hilar and mediastinal lymphadenopathy and bilateral pulmonary nodules concerning for metastatic disease and a small left adrenal gland lesion.  The patient was diagnosed in May 2022.  PRIOR THERAPY: None   CURRENT THERAPY: Palliative systemic chemotherapy with carboplatin for an AUC of 5, paclitaxel 175 mg per metered squared, Keytruda 200 mg IV every 3 weeks.  First dose expected on 12/22/2020.  INTERVAL HISTORY: Alex Edwards 85 y.o. male returns to the clinic today for a follow-up visit accompanied by his wife.  The patient was last seen in the clinic on 12/04/2020 by Dr. Julien Nordmann.  At that time, the patient had not completed the staging work-up with a PET scan.  The patient had a PET scan performed which unfortunately showed that he had some evidence of metastatic disease with a small left adrenal gland lesion and bilateral pulmonary nodules.  The plan initially was to proceed with concurrent chemoradiation, but in light of these findings, the patient is here today for further evaluation and discussion about alternative treatment plans.   Overall, the patient is feeling well today without any concerning complaints except he had bilateral lower extremity swelling which he attributes to the his Norvasc. He was on this in the past but it was discontinued due to swelling. He discontinued this  medication this AM and is going to follow up with them later this week on Thursday. He denies any fevers, chills, or night sweats. He has mild hemoptysis which is what was his first presenting symptom for his malignancy. He has mild dyspnea on exertion and fatigue. He denies any chest pain or frank hemoptysis. He denies any nausea, vomiting,  diarrhea, or constipation.  He denies any headaches or visual changes except for his macular degeneration and that his vision is dimmed. Of note his staging brain MRI was negative for metastatic disease. He has questions about financial assistance. The patient is here today for evaluation and more detailed discussion about his current condition and recommended treatment options and possibly proceeding with cycle #1 today   .   MEDICAL HISTORY: Past Medical History:  Diagnosis Date   BCE (basal cell epithelioma)    Bruises easily    on hands   Cough    smokers cough or perfurmes   Degeneration macular    right eye   Diabetes mellitus without complication (HCC)    Dyslipidemia    ED (erectile dysfunction)    Hx of adenomatous colonic polyps    Hyperlipidemia    Hypertension    borderline   Obesity    Psoriasis    Seasonal allergies    Smoker    former    ALLERGIES:  is allergic to vancomycin, amlodipine, and latex.  MEDICATIONS:  Current Outpatient Medications  Medication Sig Dispense Refill   Ascorbic Acid (VITAMIN C) 1000 MG tablet Take 1,000 mg by mouth daily.     fexofenadine (ALLEGRA) 180 MG tablet Take 180 mg by mouth daily.     ketoconazole (NIZORAL) 2 % cream APPLY TO THE AFFECTED AREA ONCE A DAY. (Patient taking differently: 1 application daily as needed for irritation.) 60 g 0   losartan (COZAAR) 100 MG tablet Take 1 tablet (100 mg total) by mouth daily. [place]  tablet 0   metoprolol tartrate (LOPRESSOR) 50 MG tablet TAKE ONE TABLET BY MOUTH TWICE DAILY. (Patient taking differently: Take 50 mg by mouth 2 (two) times daily.) 180 tablet 0   Multiple Vitamin (MULTI VITAMIN DAILY PO) Take 1 tablet by mouth daily.     Multiple Vitamins-Minerals (PRESERVISION AREDS 2) CAPS Take 1 capsule by mouth in the morning and at bedtime.     [place] Patient is to test one time a day DX: E11.9 100 each 4   ONETOUCH VERIO test strip USE TO TEST BLOOD SUGAR ONCE  DAILY. 50 strip 2   prochlorperazine (COMPAZINE) 10 MG tablet Take 1 tablet (10 mg total) by mouth every 6 (six) hours as needed for nausea or vomiting. 30 tablet 0   tamsulosin (FLOMAX) 0.4 MG CAPS capsule Take 1 capsule (0.4 mg total) by mouth daily after supper. 30 capsule 3   triamcinolone cream (KENALOG) 0.1 % APPLY TO AFFECTED AREA TWICE A DAY. (Patient taking differently: Apply 1 application topically 2 (two) times daily as needed (itching).) 454 g 0   No current facility-administered medications for this visit.    SURGICAL HISTORY:  Past Surgical History:  Procedure Laterality Date   BRONCHIAL BRUSHINGS  11/18/2020   Procedure: BRONCHIAL BRUSHINGS;  Surgeon: Spero Geralds, MD;  Location: [facility];  Service: Pulmonary;;   BRONCHIAL NEEDLE ASPIRATION BIOPSY  11/18/2020   Procedure: BRONCHIAL NEEDLE ASPIRATION BIOPSIES;  Surgeon: Spero Geralds, MD;  Location: [facility];  Service: Pulmonary;;   BRONCHIAL WASHINGS  11/18/2020   Procedure: BRONCHIAL WASHINGS;  Surgeon: Spero Geralds, MD;  Location: [facility];  Service: Pulmonary;;   CATARACT EXTRACTION, BILATERAL Bilateral 2013   COLONOSCOPY  2005   [facility]   ENDOBRONCHIAL ULTRASOUND N/A 11/18/2020   Procedure: ENDOBRONCHIAL ULTRASOUND;  Surgeon: Spero Geralds, MD;  Location: [facility];  Service: Pulmonary;  Laterality: N/A;   POLYPECTOMY     SKIN CANCER EXCISION Right 1993   under eye-MOHS, freeze multiple places freq   TONSILLECTOMY  as child   TOTAL HIP ARTHROPLASTY Left 01/04/2014   Procedure: LEFT TOTAL HIP ARTHROPLASTY ANTERIOR APPROACH;  Surgeon: Mcarthur Rossetti, MD;  Location: [facility];  Service: Orthopedics;  Laterality: Left;   TOTAL HIP ARTHROPLASTY Left 02/14/2014   Procedure: Irrigation and Debridement left hip;  Surgeon: Mcarthur Rossetti, MD;  Location: [facility];  Service: Orthopedics;  Laterality: Left;    REVIEW OF SYSTEMS:   Review of Systems  Constitutional: Positive for fatigue and weight  loss.  Negative for appetite change, chills, and fever. HENT: Negative for mouth sores, nosebleeds, sore throat and trouble swallowing.   Eyes: Negative for eye problems and icterus.  Respiratory: Positive for dyspnea on exertion and mild cough and blood-tinged sputum.  Negative for wheezing.   Cardiovascular: Positive for bilateral lower extremity swelling.  Negative for chest pain. Gastrointestinal: Negative for abdominal pain, constipation, diarrhea, nausea and vomiting.  Genitourinary: Negative for bladder incontinence, difficulty urinating, dysuria, frequency and hematuria.   Musculoskeletal: Negative for back pain, gait problem, neck pain and neck stiffness.  Skin: Negative for itching and rash.  Neurological: Negative for dizziness, extremity weakness, gait problem, headaches, light-headedness and seizures.  Hematological: Negative for adenopathy. Does not bruise/bleed easily.  Psychiatric/Behavioral: Negative for confusion, depression and sleep disturbance. The patient is not nervous/anxious.     PHYSICAL EXAMINATION:  Blood pressure 139/62, pulse 82, temperature 97.8 F (36.6 C), temperature source Tympanic, resp. rate 20, height 6\' 1"  (  1.854 m), weight 233 lb 4.8 oz (105.8 kg), SpO2 97 %.  ECOG PERFORMANCE STATUS: 1  Physical Exam  Constitutional: Oriented to person, place, and time and well-developed, well-nourished, and in no distress.  HENT:  Head: Normocephalic and atraumatic.  Mouth/Throat: Oropharynx is clear and moist. No oropharyngeal exudate.  Eyes: Conjunctivae are normal. Right eye exhibits no discharge. Left eye exhibits no discharge. No scleral icterus.  Neck: Normal range of motion. Neck supple.  Cardiovascular: Normal rate, regular rhythm, normal heart sounds and intact distal pulses.   Pulmonary/Chest: Effort normal and breath sounds normal. No respiratory distress. No wheezes. No rales.  Abdominal: Soft. Bowel sounds are normal. Exhibits no distension and no  mass. There is no tenderness.  Musculoskeletal: Normal range of motion. Exhibits no edema.  Lymphadenopathy:    No cervical adenopathy.  Neurological: Alert and oriented to person, place, and time. Exhibits normal muscle tone. Gait normal. Coordination normal.  Skin: Skin is warm and dry. No rash noted. Not diaphoretic. No erythema. No pallor.  Psychiatric: Mood, memory and judgment normal.  Vitals reviewed.  LABORATORY DATA: Lab Results  Component Value Date   WBC 6.9 12/12/2020   HGB 11.3 (L) 12/12/2020   HCT 34.5 (L) 12/12/2020   MCV 92.7 12/12/2020   PLT 273 12/12/2020      Chemistry      Component Value Date/Time   NA 138 12/12/2020 1402   NA 141 01/29/2020 1157   K 4.8 12/12/2020 1402   CL 103 12/12/2020 1402   CO2 27 12/12/2020 1402   BUN 22 12/12/2020 1402   BUN 12 01/29/2020 1157   CREATININE 1.31 (H) 12/12/2020 1402   CREATININE 1.11 07/26/2016 0921      Component Value Date/Time   CALCIUM 8.6 (L) 12/12/2020 1402   ALKPHOS 73 12/12/2020 1402   AST 14 (L) 12/12/2020 1402   ALT 11 12/12/2020 1402   BILITOT 0.4 12/12/2020 1402       RADIOGRAPHIC STUDIES:  CT SOFT TISSUE NECK WO CONTRAST  Result Date: 11/19/2020 CLINICAL DATA:  Parotid region mass. Additional history provided: Patient reports lump under left jaw for 30+ years. EXAM: CT NECK WITHOUT CONTRAST TECHNIQUE: Multidetector CT imaging of the neck was performed following the standard protocol without intravenous contrast. COMPARISON:  Chest CT 11/17/2020. FINDINGS: Mildly motion degraded exam. Pharynx and larynx: No appreciable swelling or discrete mass within the oral cavity, pharynx or larynx on this noncontrast examination. Salivary glands: 6.1 x 2.9 cm ovoid slightly heterogeneous soft tissue focus along the inferior aspect of the left parotid gland, extending to the left level 2 station and overlying the anterior aspect of the left sternocleidomastoid muscle. The right parotid and bilateral  submandibular glands are unremarkable. Thyroid: Unremarkable. Lymph nodes: Please refer to salivary gland findings above. Elsewhere within the neck, no pathologically enlarged cervical chain lymph nodes are identified. Vascular: There is limited assessment of the major vascular structures of the neck in the absence of intravenous contrast. Calcified atherosclerotic plaque within the visualized aortic arch, proximal major branch vessels of the neck and carotid arteries. Calcified plaque within the proximal right ICA could result in a hemodynamically significant stenosis. Limited intracranial: No acute intracranial abnormality identified within the field of view. Visualized orbits: Incompletely imaged. No mass or acute finding at the imaged levels. Mastoids and visualized paranasal sinuses: No significant paranasal sinus disease or mastoid effusion at the imaged levels. Skeleton: Reversal of the expected cervical lordosis. Mild C6-C7 and C7-T1 grade 1 anterolisthesis. Cervical spondylosis.  No acute bony abnormality or aggressive osseous lesion identified. Upper chest: Incompletely imaged known large left upper lobe lung mass. IMPRESSION: 6.1 x 2.9 cm ovoid soft tissue focus within the left upper neck, along the inferior aspect of the left parotid gland. Favored differential considerations are primary parotid neoplasm versus nodal metastatic disease. Direct tissue sampling should be considered. Elsewhere within the neck, no pathologically enlarged lymph nodes are identified. Partially imaged known left upper lobe pulmonary mass suspicious for bronchogenic carcinoma. Atherosclerotic disease as described. Notably, calcified plaque at the origin of the right ICA could result in a hemodynamically significant stenosis. Carotid artery duplex recommended for further evaluation. Cervical spondylosis. Electronically Signed   By: Kellie Simmering DO   On: 11/19/2020 13:25   CT Chest Wo Contrast  Result Date: 11/17/2020 CLINICAL  DATA:  Short of breath. EXAM: CT CHEST WITHOUT CONTRAST TECHNIQUE: Multidetector CT imaging of the chest was performed following the standard protocol without IV contrast. COMPARISON:  01/03/2014 FINDINGS: Cardiovascular: Heart size is normal. Aortic atherosclerosis. Coronary artery calcifications. No pericardial effusion. Mediastinum/Nodes: Normal appearance of the thyroid gland. The trachea appears patent and is midline. Normal appearance of the esophagus. No axillary or supraclavicular adenopathy. Left paratracheal lymph node measures 1.4 cm, image 59/2. Subcarinal node measures 1.3 cm, image 73/2. The hilar lymph nodes are suboptimally evaluated due to lack of IV contrast material. Lungs/Pleura: Within the left upper lobe there is a large mass measuring 7.3 cm in maximum dimension, image 65/5 and image 90/6. Internal areas of gas within this mass are noted concerning for central necrosis. Surrounding interlobular septal thickening is noted within the left upper lobe concerning for lymphangitic spread of tumor. Scattered areas of tree-in-bud nodularity are identified throughout both lungs most notable within the lower lung zones. Peripheral areas of subpleural consolidation noted within the posterior and lateral right lower lobe. Upper Abdomen: No acute abnormality within the imaged portions of the upper abdomen. Nodule in the left adrenal gland measures 1.9 cm, image 157/2. Unchanged from 2015 compatible with a benign abnormality. Musculoskeletal: No chest wall mass or suspicious bone lesions identified. IMPRESSION: 1. Large left upper lobe lung mass is identified with internal areas of gas concerning for central necrosis. Findings are worrisome for primary bronchogenic carcinoma. Surrounding interlobular septal thickening is concerning for lymphangitic spread of tumor. Further evaluation with PET-CT and tissue sampling is advised. 2. Enlarged left paratracheal and subcarinal lymph nodes are identified. Cannot  rule out metastatic adenopathy. 3. Scattered areas of tree-in-bud nodularity are identified throughout both lungs consistent with inflammatory or infectious bronchiolitis. 4. Peripheral areas of subpleural consolidation within the posterior and lateral right lower lobe are noted which may represent areas of atelectasis or pneumonia. 5. Aortic atherosclerosis. Coronary artery calcifications. Aortic Atherosclerosis (ICD10-I70.0). Electronically Signed   By: Kerby Moors M.D.   On: 11/17/2020 13:52   MR BRAIN W WO CONTRAST  Result Date: 11/20/2020 CLINICAL DATA:  Lung cancer, staging EXAM: MRI HEAD WITHOUT AND WITH CONTRAST TECHNIQUE: Multiplanar, multiecho pulse sequences of the brain and surrounding structures were obtained without and with intravenous contrast. CONTRAST:  21mL GADAVIST GADOBUTROL 1 MMOL/ML IV SOLN COMPARISON:  None. FINDINGS: Brain: There is no acute infarction or intracranial hemorrhage. There is no intracranial mass, mass effect, or edema. There is no hydrocephalus or extra-axial fluid collection. Ventricles and sulci are within normal limits in size and configuration. Patchy small foci of T2 hyperintensity in the supratentorial white matter are nonspecific but may reflect minor chronic microvascular ischemic changes.  No abnormal enhancement. Vascular: Major vessel flow voids at the skull base are preserved. Skull and upper cervical spine: Normal marrow signal is preserved. Sinuses/Orbits: Paranasal sinuses are aerated. Bilateral lens replacements. Other: Sella is partially empty. Mastoid air cells are clear. Partially imaged left neck mass along the inferior parotid better evaluated on recent neck CT. IMPRESSION: No evidence of intracranial metastatic disease. Electronically Signed   By: Macy Mis M.D.   On: 11/20/2020 09:00   NM PET Image Initial (PI) Skull Base To Thigh  Result Date: 12/06/2020 CLINICAL DATA:  Initial treatment strategy for non-small cell lung cancer. EXAM:  NUCLEAR MEDICINE PET SKULL BASE TO THIGH TECHNIQUE: 11.77 mCi F-18 FDG was injected intravenously. Full-ring PET imaging was performed from the skull base to thigh after the radiotracer. CT data was obtained and used for attenuation correction and anatomic localization. Fasting blood glucose: 100 mg/dl COMPARISON:  Chest CT 11/17/2020 and neck CT 11/19/2020 FINDINGS: Mediastinal blood pool activity: SUV max 2.16 Liver activity: SUV max NA NECK: The large parotid mass seen on recent neck CT is hypermetabolic with SUV max of 14.97. No associated neck adenopathy. 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 Recommend biopsy. Incidental CT findings: none CHEST: 8 cm left upper lobe lung mass is hypermetabolic with SUV max of 02.63. 14 mm left paratracheal/AP window node is hypermetabolic with SUV max of 7.85. There is also a focus of hypermetabolism in the right paratracheal area at the level of the carina with SUV max of 4.06. I do not see a definite lymph node in this area. Small subcarinal node is not hypermetabolic. 10 mm left infrahilar node demonstrates mild hypermetabolism with SUV max of 3.96. The peripheral left upper lobe pulmonary lesion anteriorly measures 2.6 cm on image 40/8. This is hypermetabolic with SUV max of 8.85 and could be a metastatic focus or second primary. Small scattered pulmonary nodules are suspicious for pulmonary metastatic disease. No definite hypermetabolism these are quite small. No supraclavicular or axillary adenopathy. Incidental CT findings: none ABDOMEN/PELVIS: Left adrenal gland nodule measuring 15 mm is mildly hypermetabolic with SUV max of 0.27 and suspicious for small metastasis. No hepatic lesions are identified. No abdominal or pelvic hypermetabolic lymphadenopathy. Incidental CT findings: Moderate aortic calcifications without focal aneurysm. Sigmoid colon diverticulosis. Enlarged prostate gland with median lobe hypertrophy impressing on the base of the bladder.  Bilateral inguinal hernias containing fat. SKELETON: No findings suspicious for osseous metastatic disease. Incidental CT findings: none IMPRESSION: 1. 8 cm hypermetabolic mass in the left upper lobe consistent with known neoplasm. 2. Second smaller more peripheral lesion anterior left upper lobe is also hypermetabolic and could be a metastatic focus or second primary. 3. Small scattered pulmonary nodules suspicious for pulmonary metastatic disease. 4. Left hilar and mediastinal metastatic adenopathy. 5. Small left adrenal gland nodule is hypermetabolic and suspicious for a metastasis. 6. 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 7. No findings for osseous metastatic disease. Electronically Signed   By: Marijo Sanes M.D.   On: 12/06/2020 19:02   DG CHEST PORT 1 VIEW  Result Date: 11/21/2020 CLINICAL DATA:  85 year old male with history of hypoxia. EXAM: PORTABLE CHEST 1 VIEW COMPARISON:  Chest x-ray 11/17/2020. FINDINGS: Large mass in the periphery of the left mid lung, similar to the recent prior study, estimated to measure approximately 8.8 x 6.3 cm. Areas of interstitial prominence an ill-defined opacities surrounding the lesion and in the left lung base may  reflect postobstructive changes and/or lymphangitic spread of tumor as demonstrated on prior chest CT. Small left pleural effusion. Right lung is clear. No right pleural effusion. No pneumothorax. No evidence of pulmonary edema. Heart size is normal. Upper mediastinal contours are within normal limits. Aortic atherosclerosis. IMPRESSION: 1. Large left-sided lung mass previously demonstrated to be in the left upper lobe with surrounding parenchymal changes suggestive of lymphangitic spread of tumor and/or postobstructive changes with small left pleural effusion. 2. Aortic atherosclerosis. Electronically Signed   By: Vinnie Langton M.D.   On: 11/21/2020 14:27   DG Chest Portable 1 View  Result  Date: 11/17/2020 CLINICAL DATA:  Worsening cough, congestion, shortness of breath. Bloods tinged sputum. EXAM: PORTABLE CHEST 1 VIEW COMPARISON:  February 14, 2018. FINDINGS: Rounded/masslike 8 cm opacity in the lateral left midlung. Mild diffuse interstitial prominence. No visible pleural effusions or pneumothorax. Similar cardiomediastinal silhouette. IMPRESSION: 1. Rounded/masslike 8 cm opacity in the lateral left midlung. Recommend chest CT to evaluate for possible mass. 2. Diffuse interstitial prominence, possibly chronic bronchitic related change given similar findings on the prior. The recommend chest CT can also better characterize this finding and evaluate for superimposed interstitial edema or atypical infection. Electronically Signed   By: Margaretha Sheffield MD   On: 11/17/2020 12:05   [place] CORE BIOPSY (SALIVARY GLAND/PAROTID GLAND)  Result Date: 11/20/2020 CLINICAL DATA:  Parotid region mass, left submandibular lump EXAM: ULTRASOUND GUIDED CORE BIOPSY OF LEFT NECK MASS MEDICATIONS: Intravenous Fentanyl 44mcg and Versed 1mg  were administered as conscious sedation during continuous monitoring of the patient's level of consciousness and physiological / cardiorespiratory status by the radiology RN, with a total moderate sedation time of 20 minutes. PROCEDURE: The procedure, risks, benefits, and alternatives were explained to the patient. Questions regarding the procedure were encouraged and answered. The patient understands and consents to the procedure. Survey ultrasound of the left neck was performed. The complex left neck mass was localized and an appropriate skin entry site was determined and marked. The operative field was prepped with chlorhexidine in a sterile fashion, and a sterile drape was applied covering the operative field. A sterile gown and sterile gloves were used for the procedure. Local anesthesia was provided with 1% Lidocaine. Under real-time ultrasound guidance, a 17 gauge trocar needle  was advanced to the margin of the lesion. Once needle tip position was confirmed, coaxial 18-gauge core biopsy samples were obtained, submitted in saline to surgical pathology. The guide needle was removed. Postprocedure scans show no hemorrhage or other apparent complication. The patient tolerated the procedure well. COMPLICATIONS: None. FINDINGS: Complex left neck mass was localized corresponding to CT findings. Representative core biopsy samples obtained as above. IMPRESSION: 1. Technically successful ultrasound-guided core biopsy, left neck mass. Electronically Signed   By: Lucrezia Europe M.D.   On: 11/20/2020 13:47   VAS US CAROTID  Result Date: 11/19/2020 Carotid Arterial Duplex Study Patient Name:  Alex Edwards  Date of Exam:   11/19/2020 Medical Rec #: [id number]        Accession #:    [id number] Date of Birth: 09-28-1935         Patient Gender: M Patient Age:   85Y Exam Location:  [facility] Procedure:      VAS US CAROTID Referring Phys: [id number] [place] --------------------------------------------------------------------------------  Indications:       Stenosis. Risk Factors:      Hypertension, hyperlipidemia. Comparison Study:  No prior studies. Performing Technologist: Oliver Hum RVT  Examination Guidelines: A  complete evaluation includes B-mode imaging, spectral Doppler, color Doppler, and power Doppler as needed of all accessible portions of each vessel. Bilateral testing is considered an integral part of a complete examination. Limited examinations for reoccurring indications may be performed as noted.  Right Carotid Findings: +----------+--------+--------+--------+--------------------------+--------+           PSV cm/sEDV cm/sStenosisPlaque Description        Comments +----------+--------+--------+--------+--------------------------+--------+ CCA Prox  99      20              smooth and heterogenous             +----------+--------+--------+--------+--------------------------+--------+ CCA Distal64      15              smooth and heterogenous            +----------+--------+--------+--------+--------------------------+--------+ ICA Prox  453     165     80-99%  irregular and heterogenous         +----------+--------+--------+--------+--------------------------+--------+ ICA Mid   267     91      60-79%                                     +----------+--------+--------+--------+--------------------------+--------+ ICA Distal130     54                                        tortuous +----------+--------+--------+--------+--------------------------+--------+ ECA       233     15                                                 +----------+--------+--------+--------+--------------------------+--------+ +----------+--------+-------+--------+-------------------+           PSV cm/sEDV cmsDescribeArm Pressure (mmHG) +----------+--------+-------+--------+-------------------+ Subclavian213                                        +----------+--------+-------+--------+-------------------+ +---------+--------+--+--------+--+---------+ VertebralPSV cm/s58EDV cm/s14Antegrade +---------+--------+--+--------+--+---------+  Left Carotid Findings: +----------+--------+--------+--------+-----------------------+--------+           PSV cm/sEDV cm/sStenosisPlaque Description     Comments +----------+--------+--------+--------+-----------------------+--------+ CCA Prox  129     26              smooth and heterogenous         +----------+--------+--------+--------+-----------------------+--------+ CCA Distal119     28              smooth and heterogenous         +----------+--------+--------+--------+-----------------------+--------+ ICA Prox  133     46      40-59%  smooth and heterogenous         +----------+--------+--------+--------+-----------------------+--------+  ICA Distal199     42                                     tortuous +----------+--------+--------+--------+-----------------------+--------+ ECA       193     12                                              +----------+--------+--------+--------+-----------------------+--------+ +----------+--------+--------+--------+-------------------+  PSV cm/sEDV cm/sDescribeArm Pressure (mmHG) +----------+--------+--------+--------+-------------------+ Subclavian151                                         +----------+--------+--------+--------+-------------------+ +---------+--------+--+--------+--+---------+ VertebralPSV cm/s59EDV cm/s15Antegrade +---------+--------+--+--------+--+---------+   Summary: Right Carotid: Velocities in the right ICA are consistent with a 80-99%                stenosis. Left Carotid: Velocities in the left ICA are consistent with a 40-59% stenosis. Vertebrals: Bilateral vertebral arteries demonstrate antegrade flow. *See table(s) above for measurements and observations.  Electronically signed by Harold Barban MD on 11/19/2020 at 9:21:07 PM.    Final    DG C-ARM BRONCHOSCOPY  Result Date: 11/18/2020 C-ARM BRONCHOSCOPY: Fluoroscopy was utilized by the requesting physician.  No radiographic interpretation.     ASSESSMENT/PLAN:  This is a very pleasant 85 year old Caucasian male diagnosed with stage IV (T3, N2, M1 C) non-small cell lung cancer, squamous cell carcinoma.  He presented with a large left upper lobe lung mass in addition to left hilar mediastinal lymphadenopathy, scattered bilateral pulmonary nodules, and a small lesion in the left adrenal gland suspicious for metastatic disease.  The patient was diagnosed in May 2022.   The patient was seen with Dr. Julien Nordmann today.  Dr. Julien Nordmann personally and independently reviewed his PET scan results and discussed the results with the patient today.  Dr. Julien Nordmann discussed that, unfortunately, this is stage  IV disease and while treatment would not be curable, he is certainly treatable.  Dr. Julien Nordmann discussed that instead of reduced dose carboplatin and paclitaxel, that he would recommend full dose palliative systemic chemotherapy with carboplatin for an AUC of 5, paclitaxel 175 mg per metered squared, and Keytruda 200 mg IV every 3 weeks with Neulasta support.   The patient is interested in this option and he is expected to start his first dose of treatment next week on 12/22/20.  I will arrange for Neulasta injection to be administered on 12/24/2020.  The adverse side effects of treatment were discussed including but not limited to nausea, vomiting, myelosuppression, alopecia, peripheral neuropathy, kidney, and liver dysfunction.  We will see the patient back for follow-up visit in 2 weeks for evaluation for a 1 week follow-up visit to manage any adverse side effects of treatment.  The patient was not interested in meeting with a member of the research team to see if he is eligible for the fatigue care study.  The patient's labs from Friday showed that he may be a little dehydrated.  The patient was encouraged to increase his oral intake.  The patient has some questions about financial assistance, I will reach out to the financial resource specialist to see what assistance the patient qualifies for.  The patient was advised to call immediately if he has any concerning symptoms in the interval. The patient voices understanding of current disease status and treatment options and is in agreement with the current care plan. All questions were answered. The patient knows to call the clinic with any problems, questions or concerns. We can certainly see the patient much sooner if necessary   Orders Placed This Encounter  Procedures   TSH    Standing Status:   Standing    Number of Occurrences:   4    Standing Expiration Date:   12/15/2021      Tobe Sos Nickie Deren,  PA-C 12/15/20  ADDENDUM: Hematology/Oncology Attending:  I had a face-to-face encounter with the patient today.  I reviewed his record, scan and recommended his care plan.  This is a very pleasant 85 years old white male recently diagnosed with a stage IV (T3, N2, M1 C) non-small cell lung cancer, squamous cell carcinoma presented with large left upper lobe lung mass in addition to left hilar and mediastinal lymphadenopathy as well as scattered bilateral pulmonary nodules and suspicious left adrenal gland metastasis diagnosed in May 2022.  The patient was initially thought to have stage III and he was considered for a course of concurrent chemoradiation but unfortunately the PET scan showed evidence for metastatic disease. I had a lengthy discussion with the patient and his wife today about his current condition and treatment options. I explained to the patient that he has incurable condition and all the treatment will be of palliative nature. I gave the patient the option of palliative care and hospice referral versus consideration of palliative systemic chemotherapy with carboplatin for AUC of 5, paclitaxel 175 Mg/M2 and Keytruda 200 Mg IV every 3 weeks with Neulasta support. I discussed with the patient the adverse effect of this treatment including but not limited to alopecia, myelosuppression, nausea and vomiting, peripheral neuropathy, liver or renal dysfunction as well as immunotherapy adverse effects. The patient is interested in proceeding with the systemic chemotherapy and he is expected to start the first cycle of this treatment next week. He was advised to call immediately if he has any other concerning symptoms in the interval. The total time spent in the appointment was 30 minutes. Disclaimer: This note was dictated with voice recognition software. Similar sounding words can inadvertently be transcribed and may be missed upon review. Eilleen Kempf, MD 12/15/20

## 2020-12-15 ENCOUNTER — Inpatient Hospital Stay (HOSPITAL_BASED_OUTPATIENT_CLINIC_OR_DEPARTMENT_OTHER): Payer: Medicare Other | Admitting: Physician Assistant

## 2020-12-15 ENCOUNTER — Encounter: Payer: Self-pay | Admitting: Physician Assistant

## 2020-12-15 ENCOUNTER — Inpatient Hospital Stay: Payer: Medicare Other

## 2020-12-15 ENCOUNTER — Other Ambulatory Visit: Payer: Self-pay

## 2020-12-15 ENCOUNTER — Inpatient Hospital Stay: Payer: Medicare Other | Admitting: Physician Assistant

## 2020-12-15 ENCOUNTER — Encounter: Payer: Self-pay | Admitting: Internal Medicine

## 2020-12-15 ENCOUNTER — Encounter: Payer: Self-pay | Admitting: *Deleted

## 2020-12-15 ENCOUNTER — Other Ambulatory Visit: Payer: Medicare Other

## 2020-12-15 VITALS — BP 139/62 | HR 82 | Temp 97.8°F | Resp 20 | Ht 73.0 in | Wt 233.3 lb

## 2020-12-15 DIAGNOSIS — I10 Essential (primary) hypertension: Secondary | ICD-10-CM | POA: Diagnosis not present

## 2020-12-15 DIAGNOSIS — R0602 Shortness of breath: Secondary | ICD-10-CM | POA: Diagnosis not present

## 2020-12-15 DIAGNOSIS — Z79899 Other long term (current) drug therapy: Secondary | ICD-10-CM | POA: Diagnosis not present

## 2020-12-15 DIAGNOSIS — C3492 Malignant neoplasm of unspecified part of left bronchus or lung: Secondary | ICD-10-CM

## 2020-12-15 DIAGNOSIS — C3412 Malignant neoplasm of upper lobe, left bronchus or lung: Secondary | ICD-10-CM | POA: Diagnosis not present

## 2020-12-15 DIAGNOSIS — Z85828 Personal history of other malignant neoplasm of skin: Secondary | ICD-10-CM | POA: Diagnosis not present

## 2020-12-15 DIAGNOSIS — C7972 Secondary malignant neoplasm of left adrenal gland: Secondary | ICD-10-CM | POA: Diagnosis not present

## 2020-12-15 DIAGNOSIS — E119 Type 2 diabetes mellitus without complications: Secondary | ICD-10-CM | POA: Diagnosis not present

## 2020-12-15 DIAGNOSIS — K5730 Diverticulosis of large intestine without perforation or abscess without bleeding: Secondary | ICD-10-CM | POA: Diagnosis not present

## 2020-12-15 DIAGNOSIS — R590 Localized enlarged lymph nodes: Secondary | ICD-10-CM | POA: Diagnosis not present

## 2020-12-15 DIAGNOSIS — Z86010 Personal history of colonic polyps: Secondary | ICD-10-CM | POA: Diagnosis not present

## 2020-12-15 DIAGNOSIS — I700 Atherosclerosis of aorta: Secondary | ICD-10-CM | POA: Diagnosis not present

## 2020-12-15 DIAGNOSIS — R5383 Other fatigue: Secondary | ICD-10-CM | POA: Diagnosis not present

## 2020-12-15 DIAGNOSIS — M4312 Spondylolisthesis, cervical region: Secondary | ICD-10-CM | POA: Diagnosis not present

## 2020-12-15 DIAGNOSIS — Z5189 Encounter for other specified aftercare: Secondary | ICD-10-CM | POA: Diagnosis not present

## 2020-12-15 DIAGNOSIS — Z87891 Personal history of nicotine dependence: Secondary | ICD-10-CM | POA: Diagnosis not present

## 2020-12-15 DIAGNOSIS — E785 Hyperlipidemia, unspecified: Secondary | ICD-10-CM | POA: Diagnosis not present

## 2020-12-15 NOTE — Progress Notes (Signed)
[name] was seen with Cassi PA-C today.  Treatment plan is to have systemic therapy to start next week.  I will follow up on his schedule.

## 2020-12-15 NOTE — Patient Instructions (Addendum)
-  There are two main categories of lung cancer, they are named based on the size of the cancer cell. One is called Non-Small cell lung cancer. The other type is Small Cell Lung Cancer -The sample (biopsy) that they took of your tumor was consistent with a subtype of Non-small cell lung cancer called Squamous Cell Carcinoma. T -We covered a lot of important information at your appointment today regarding what the treatment plan is moving forward. Here are the the main points that were discussed at your office visit with [place] today:  -The treatment that you will receive consists of two chemotherapy drugs, called Carboplatin and Paclitaxel (also referred to as Taxol) and one immunotherapy drug called Keytruda (pembrolizumab).  -We are planning on starting your treatment next week on 12/22/20 -Your treatment will be given once every 3 weeks. We will check your labs once a week for the first ~5 treatments just to make sure that important components of your blood are in an acceptable range -You will need to return 2 days after your treatment to receive an injection. This injection is important because it boosts your infection fighting cells in your body and helps protect you from getting an infection.  -We will get a CT scan after 3 treatments to check on the progress of treatment  Medications:  -Compazine was sent to your pharmacy. This medication is for nausea. You may take this every 6 hours as needed if you feel nausous.   Referrals or Imaging:   Follow up:  -We will see you back for a follow up visit in about  2 weeks to see how your first treatment went and to make sure you are not having any side effects from treatment.   If you need to contact our office, please do not hesitate, we are here to help. Our number is [phone]. When you call, ask to speak to Cassie's or Dr. Worthy Flank nurse.

## 2020-12-15 NOTE — Progress Notes (Signed)
Pharmacist Chemotherapy Monitoring - Initial Assessment    Anticipated start date: 12/22/20   Regimen:  Are orders appropriate based on the patient's diagnosis, regimen, and cycle? Yes Does the plan date match the patient's scheduled date? Yes Is the sequencing of drugs appropriate? Yes Are the premedications appropriate for the patient's regimen? Yes Prior Authorization for treatment is: Not Started If applicable, is the correct biosimilar selected based on the patient's insurance? not applicable  Organ Function and Labs: Are dose adjustments needed based on the patient's renal function, hepatic function, or hematologic function? Yes Are appropriate labs ordered prior to the start of patient's treatment? Yes Other organ system assessment, if indicated: N/A The following baseline labs, if indicated, have been ordered: pembrolizumab: baseline TSH +/- T4  Dose Assessment: Are the drug doses appropriate? Yes Are the following correct: Drug concentrations Yes IV fluid compatible with drug Yes Administration routes Yes Timing of therapy Yes If applicable, does the patient have documented access for treatment and/or plans for port-a-cath placement? no If applicable, have lifetime cumulative doses been properly documented and assessed? yes Lifetime Dose Tracking  No doses have been documented on this patient for the following tracked chemicals: Doxorubicin, Epirubicin, Idarubicin, Daunorubicin, Mitoxantrone, Bleomycin, Oxaliplatin, Carboplatin, Liposomal Doxorubicin    Toxicity Monitoring/Prevention: The patient has the following take home antiemetics prescribed: Prochlorperazine The patient has the following take home medications prescribed: N/A Medication allergies and previous infusion related reactions, if applicable, have been reviewed and addressed. No The patient's current medication list has been assessed for drug-drug interactions with their chemotherapy regimen. no significant  drug-drug interactions were identified on review.  Order Review: Are the treatment plan orders signed? Yes Is the patient scheduled to see a provider prior to their treatment? Yes  I verify that I have reviewed each item in the above checklist and answered each question accordingly.  Larene Beach, [place], 12/15/2020  2:25 PM

## 2020-12-15 NOTE — Progress Notes (Signed)
Called pt to introduce myself as his Arboriculturist.  Unfortunately there aren't any foundations offering copay assistance for his Dx and the type of ins he has. I offered the [employer] and went over what it covers.  Pt would like to apply so he will bring his proof of income on 12/22/20.  If approved I will give him an expense sheet and my card for any questions or concerns he may have in the future.

## 2020-12-19 NOTE — Progress Notes (Signed)
The following biosimilar Ziextenzo (pegfilgrastim-bmez) has been selected for use in this patient.  Kennith Center, Pharm.D., CPP 12/19/2020@4 :52 PM

## 2020-12-22 ENCOUNTER — Inpatient Hospital Stay (HOSPITAL_BASED_OUTPATIENT_CLINIC_OR_DEPARTMENT_OTHER): Payer: Medicare Other | Admitting: Physician Assistant

## 2020-12-22 ENCOUNTER — Other Ambulatory Visit: Payer: Medicare Other

## 2020-12-22 ENCOUNTER — Other Ambulatory Visit: Payer: Self-pay

## 2020-12-22 ENCOUNTER — Inpatient Hospital Stay: Payer: Medicare Other

## 2020-12-22 ENCOUNTER — Encounter: Payer: Self-pay | Admitting: Internal Medicine

## 2020-12-22 ENCOUNTER — Ambulatory Visit: Payer: Medicare Other | Admitting: Internal Medicine

## 2020-12-22 ENCOUNTER — Other Ambulatory Visit: Payer: Self-pay | Admitting: Physician Assistant

## 2020-12-22 VITALS — BP 133/66 | HR 75 | Temp 97.8°F | Resp 18 | Wt 233.0 lb

## 2020-12-22 DIAGNOSIS — E119 Type 2 diabetes mellitus without complications: Secondary | ICD-10-CM | POA: Diagnosis not present

## 2020-12-22 DIAGNOSIS — Z87891 Personal history of nicotine dependence: Secondary | ICD-10-CM | POA: Diagnosis not present

## 2020-12-22 DIAGNOSIS — M7989 Other specified soft tissue disorders: Secondary | ICD-10-CM

## 2020-12-22 DIAGNOSIS — Z5111 Encounter for antineoplastic chemotherapy: Secondary | ICD-10-CM

## 2020-12-22 DIAGNOSIS — Z86010 Personal history of colonic polyps: Secondary | ICD-10-CM | POA: Diagnosis not present

## 2020-12-22 DIAGNOSIS — I10 Essential (primary) hypertension: Secondary | ICD-10-CM | POA: Diagnosis not present

## 2020-12-22 DIAGNOSIS — C3492 Malignant neoplasm of unspecified part of left bronchus or lung: Secondary | ICD-10-CM | POA: Diagnosis not present

## 2020-12-22 DIAGNOSIS — M4312 Spondylolisthesis, cervical region: Secondary | ICD-10-CM | POA: Diagnosis not present

## 2020-12-22 DIAGNOSIS — C7972 Secondary malignant neoplasm of left adrenal gland: Secondary | ICD-10-CM | POA: Diagnosis not present

## 2020-12-22 DIAGNOSIS — Z85828 Personal history of other malignant neoplasm of skin: Secondary | ICD-10-CM | POA: Diagnosis not present

## 2020-12-22 DIAGNOSIS — R0602 Shortness of breath: Secondary | ICD-10-CM | POA: Diagnosis not present

## 2020-12-22 DIAGNOSIS — R5383 Other fatigue: Secondary | ICD-10-CM | POA: Diagnosis not present

## 2020-12-22 DIAGNOSIS — K5730 Diverticulosis of large intestine without perforation or abscess without bleeding: Secondary | ICD-10-CM | POA: Diagnosis not present

## 2020-12-22 DIAGNOSIS — Z5189 Encounter for other specified aftercare: Secondary | ICD-10-CM | POA: Diagnosis not present

## 2020-12-22 DIAGNOSIS — I700 Atherosclerosis of aorta: Secondary | ICD-10-CM | POA: Diagnosis not present

## 2020-12-22 DIAGNOSIS — Z79899 Other long term (current) drug therapy: Secondary | ICD-10-CM | POA: Diagnosis not present

## 2020-12-22 DIAGNOSIS — R590 Localized enlarged lymph nodes: Secondary | ICD-10-CM | POA: Diagnosis not present

## 2020-12-22 DIAGNOSIS — E785 Hyperlipidemia, unspecified: Secondary | ICD-10-CM | POA: Diagnosis not present

## 2020-12-22 DIAGNOSIS — C3412 Malignant neoplasm of upper lobe, left bronchus or lung: Secondary | ICD-10-CM | POA: Diagnosis not present

## 2020-12-22 LAB — CBC WITH DIFFERENTIAL (CANCER CENTER ONLY)
Abs Immature Granulocytes: 0.04 10*3/uL (ref 0.00–0.07)
Basophils Absolute: 0 10*3/uL (ref 0.0–0.1)
Basophils Relative: 0 %
Eosinophils Absolute: 0.2 10*3/uL (ref 0.0–0.5)
Eosinophils Relative: 3 %
HCT: 36.7 % — ABNORMAL LOW (ref 39.0–52.0)
Hemoglobin: 12.3 g/dL — ABNORMAL LOW (ref 13.0–17.0)
Immature Granulocytes: 1 %
Lymphocytes Relative: 20 %
Lymphs Abs: 1.5 10*3/uL (ref 0.7–4.0)
MCH: 30.6 pg (ref 26.0–34.0)
MCHC: 33.5 g/dL (ref 30.0–36.0)
MCV: 91.3 fL (ref 80.0–100.0)
Monocytes Absolute: 0.8 10*3/uL (ref 0.1–1.0)
Monocytes Relative: 11 %
Neutro Abs: 5 10*3/uL (ref 1.7–7.7)
Neutrophils Relative %: 65 %
Platelet Count: 288 10*3/uL (ref 150–400)
RBC: 4.02 MIL/uL — ABNORMAL LOW (ref 4.22–5.81)
RDW: 13.1 % (ref 11.5–15.5)
WBC Count: 7.6 10*3/uL (ref 4.0–10.5)
nRBC: 0 % (ref 0.0–0.2)

## 2020-12-22 LAB — CMP (CANCER CENTER ONLY)
ALT: 9 U/L (ref 0–44)
AST: 14 U/L — ABNORMAL LOW (ref 15–41)
Albumin: 3.2 g/dL — ABNORMAL LOW (ref 3.5–5.0)
Alkaline Phosphatase: 83 U/L (ref 38–126)
Anion gap: 10 (ref 5–15)
BUN: 17 mg/dL (ref 8–23)
CO2: 23 mmol/L (ref 22–32)
Calcium: 9.1 mg/dL (ref 8.9–10.3)
Chloride: 105 mmol/L (ref 98–111)
Creatinine: 1.05 mg/dL (ref 0.61–1.24)
GFR, Estimated: >60 mL/min (ref >60)
Glucose, Bld: 104 mg/dL — ABNORMAL HIGH (ref 70–99)
Potassium: 4.7 mmol/L (ref 3.5–5.1)
Sodium: 138 mmol/L (ref 135–145)
Total Bilirubin: 0.5 mg/dL (ref 0.3–1.2)
Total Protein: 6.9 g/dL (ref 6.5–8.1)

## 2020-12-22 LAB — TSH: TSH: 4.047 u[IU]/mL (ref 0.320–4.118)

## 2020-12-22 MED ORDER — FAMOTIDINE 20 MG IN NS 100 ML IVPB
20.0000 mg | Freq: Once | INTRAVENOUS | Status: AC
  Administered 2020-12-22: 20 mg via INTRAVENOUS

## 2020-12-22 MED ORDER — SODIUM CHLORIDE 0.9 % IV SOLN
10.0000 mg | Freq: Once | INTRAVENOUS | Status: AC
  Administered 2020-12-22: 10 mg via INTRAVENOUS
  Filled 2020-12-22: qty 10

## 2020-12-22 MED ORDER — SODIUM CHLORIDE 0.9 % IV SOLN
511.5000 mg | Freq: Once | INTRAVENOUS | Status: AC
  Administered 2020-12-22: 510 mg via INTRAVENOUS
  Filled 2020-12-22: qty 51

## 2020-12-22 MED ORDER — FAMOTIDINE 20 MG IN NS 100 ML IVPB
INTRAVENOUS | Status: AC
  Filled 2020-12-22: qty 100

## 2020-12-22 MED ORDER — SODIUM CHLORIDE 0.9 % IV SOLN
Freq: Once | INTRAVENOUS | Status: AC
Start: 2020-12-22 — End: 2020-12-22
  Filled 2020-12-22: qty 250

## 2020-12-22 MED ORDER — DIPHENHYDRAMINE HCL 50 MG/ML IJ SOLN
INTRAMUSCULAR | Status: AC
  Filled 2020-12-22: qty 1

## 2020-12-22 MED ORDER — PALONOSETRON HCL INJECTION 0.25 MG/5ML
INTRAVENOUS | Status: AC
  Filled 2020-12-22: qty 5

## 2020-12-22 MED ORDER — DIPHENHYDRAMINE HCL 50 MG/ML IJ SOLN
50.0000 mg | Freq: Once | INTRAMUSCULAR | Status: AC
  Administered 2020-12-22: 50 mg via INTRAVENOUS

## 2020-12-22 MED ORDER — SODIUM CHLORIDE 0.9 % IV SOLN
200.0000 mg | Freq: Once | INTRAVENOUS | Status: AC
  Administered 2020-12-22: 200 mg via INTRAVENOUS
  Filled 2020-12-22: qty 8

## 2020-12-22 MED ORDER — SODIUM CHLORIDE 0.9 % IV SOLN
175.0000 mg/m2 | Freq: Once | INTRAVENOUS | Status: AC
  Administered 2020-12-22: 408 mg via INTRAVENOUS
  Filled 2020-12-22: qty 68

## 2020-12-22 MED ORDER — SODIUM CHLORIDE 0.9 % IV SOLN
150.0000 mg | Freq: Once | INTRAVENOUS | Status: AC
  Administered 2020-12-22: 150 mg via INTRAVENOUS
  Filled 2020-12-22: qty 150

## 2020-12-22 MED ORDER — PALONOSETRON HCL INJECTION 0.25 MG/5ML
0.2500 mg | Freq: Once | INTRAVENOUS | Status: AC
  Administered 2020-12-22: 0.25 mg via INTRAVENOUS

## 2020-12-22 NOTE — Progress Notes (Signed)
[facility] OFFICE PROGRESS NOTE  [name], MD [place] [address]  DIAGNOSIS: Stage IV (T3, N2, M1c) small cell lung cancer, squamous cell carcinoma.  He presented with a large left upper lobe lung mass in addition to left hilar and mediastinal lymphadenopathy and bilateral pulmonary nodules concerning for metastatic disease and a small left adrenal gland lesion.  The patient was diagnosed in May 2022.  PRIOR THERAPY:  None   CURRENT THERAPY: Palliative systemic chemotherapy with carboplatin for an AUC of 5, paclitaxel 175 mg per metered squared, Keytruda 200 mg IV every 3 weeks.  First dose expected on 12/22/2020.  INTERVAL HISTORY: Alex Edwards 85 y.o. male returns to the clinic today for follow-up visit for his first cycle of systemic chemotherapy.  The patient started experiencing bilateral lower extremity swelling after his hospitalization in May 2022.  He was discharged on 11/22/2020.  The patient started developing bilateral lower extremity swelling.  The patient attributed his symptoms to his amlodipine which he discontinued last week.  He had improvement in his lower extremity swelling in the right leg but has some persistent calf pain in the left leg and swelling.  He saw his PCP on 12/18/2020.  The patient is not on a blood thinner. 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 He also has a history of a hip replacement on the left side. The patient denies any new concerns today. I was asked to evaluate the patient due to his lower extremity swelling.    MEDICAL HISTORY: Past Medical History:  Diagnosis Date   BCE (basal cell epithelioma)    Bruises easily    on hands   Cough    smokers cough or perfurmes   Degeneration macular    right eye   Diabetes mellitus without complication (HCC)    Dyslipidemia    ED (erectile dysfunction)    Hx of adenomatous  colonic polyps    Hyperlipidemia    Hypertension    borderline   Obesity    Psoriasis    Seasonal allergies    Smoker    former    ALLERGIES:  is allergic to vancomycin, amlodipine, and latex.  MEDICATIONS:  Current Outpatient Medications  Medication Sig Dispense Refill   Ascorbic Acid (VITAMIN C) 1000 MG tablet Take 1,000 mg by mouth daily.     fexofenadine (ALLEGRA) 180 MG tablet Take 180 mg by mouth daily.     ketoconazole (NIZORAL) 2 % cream APPLY TO THE AFFECTED AREA ONCE A DAY. (Patient taking differently: 1 application daily as needed for irritation.) 60 g 0   losartan (COZAAR) 100 MG tablet Take 1 tablet (100 mg total) by mouth daily. 30 tablet 0   metoprolol tartrate (LOPRESSOR) 50 MG tablet TAKE ONE TABLET BY MOUTH TWICE DAILY. (Patient taking differently: Take 50 mg by mouth 2 (two) times daily.) 180 tablet 0   Multiple Vitamin (MULTI VITAMIN DAILY PO) Take 1 tablet by mouth daily.     Multiple Vitamins-Minerals (PRESERVISION AREDS 2) CAPS Take 1 capsule by mouth in the morning and at bedtime.     [place] Patient is to test one time a day DX: E11.9 100 each 4   ONETOUCH VERIO test strip USE TO TEST BLOOD SUGAR ONCE DAILY. 50 strip 2   prochlorperazine (COMPAZINE) 10 MG tablet Take 1 tablet (10 mg total)  by mouth every 6 (six) hours as needed for nausea or vomiting. 30 tablet 0   tamsulosin (FLOMAX) 0.4 MG CAPS capsule Take 1 capsule (0.4 mg total) by mouth daily after supper. 30 capsule 3   triamcinolone cream (KENALOG) 0.1 % APPLY TO AFFECTED AREA TWICE A DAY. (Patient taking differently: Apply 1 application topically 2 (two) times daily as needed (itching).) 454 g 0   No current facility-administered medications for this visit.   Facility-Administered Medications Ordered in Other Visits  Medication Dose Route Frequency Provider Last Rate Last Admin   CARBOplatin (PARAPLATIN) 510 mg in sodium chloride 0.9 % 250 mL chemo infusion  510 mg  Intravenous Once [name], MD       famotidine (PEPCID) IVPB 20 mg in NS 100 mL IVPB  20 mg Intravenous Once [name], MD       PACLitaxel (TAXOL) 408 mg in sodium chloride 0.9 % 500 mL chemo infusion (> 80mg /m2)  175 mg/m2 (Treatment Plan Recorded) Intravenous Once [name], MD       pembrolizumab [facility]) 200 mg in sodium chloride 0.9 % 50 mL chemo infusion  200 mg Intravenous Once [name], MD        SURGICAL HISTORY:  Past Surgical History:  Procedure Laterality Date   BRONCHIAL BRUSHINGS  11/18/2020   Procedure: BRONCHIAL BRUSHINGS;  Surgeon: Spero Geralds, MD;  Location: [facility];  Service: Pulmonary;;   BRONCHIAL NEEDLE ASPIRATION BIOPSY  11/18/2020   Procedure: BRONCHIAL NEEDLE ASPIRATION BIOPSIES;  Surgeon: Spero Geralds, MD;  Location: [facility];  Service: Pulmonary;;   BRONCHIAL WASHINGS  11/18/2020   Procedure: BRONCHIAL WASHINGS;  Surgeon: Spero Geralds, MD;  Location: [facility];  Service: Pulmonary;;   CATARACT EXTRACTION, BILATERAL Bilateral 2013   COLONOSCOPY  2005   [facility]   ENDOBRONCHIAL ULTRASOUND N/A 11/18/2020   Procedure: ENDOBRONCHIAL ULTRASOUND;  Surgeon: Spero Geralds, MD;  Location: [facility];  Service: Pulmonary;  Laterality: N/A;   POLYPECTOMY     SKIN CANCER EXCISION Right 1993   under eye-MOHS, freeze multiple places freq   TONSILLECTOMY  as child   TOTAL HIP ARTHROPLASTY Left 01/04/2014   Procedure: LEFT TOTAL HIP ARTHROPLASTY ANTERIOR APPROACH;  Surgeon: Mcarthur Rossetti, MD;  Location: [facility];  Service: Orthopedics;  Laterality: Left;   TOTAL HIP ARTHROPLASTY Left 02/14/2014   Procedure: Irrigation and Debridement left hip;  Surgeon: Mcarthur Rossetti, MD;  Location: [facility];  Service: Orthopedics;  Laterality: Left;    REVIEW OF SYSTEMS:   Review of Systems  Constitutional: Negative for appetite change, chills, fatigue, fever and unexpected weight change.  HENT:   Negative for mouth sores,  nosebleeds, sore throat and trouble swallowing.   Eyes: Negative for eye problems and icterus.  Respiratory: Positive for dyspnea on exertion and mild cough and blood-tinged sputum.  Negative for wheezing.   Cardiovascular: Positive for left lower extremity swelling.  Negative for chest pain. Gastrointestinal: Negative for abdominal pain, constipation, diarrhea, nausea and vomiting. Genitourinary: Negative for bladder incontinence, difficulty urinating, dysuria, frequency and hematuria.   Musculoskeletal: Negative for back pain, gait problem, neck pain and neck stiffness. Skin: Negative for itching and rash. Neurological: Negative for dizziness, extremity weakness, gait problem, headaches, light-headedness and seizures. Hematological: Negative for adenopathy. Does not bruise/bleed easily. Psychiatric/Behavioral: Negative for confusion, depression and sleep disturbance. The patient is not nervous/anxious.       PHYSICAL EXAMINATION:  There were no vitals taken for this visit.  ECOG PERFORMANCE STATUS:  1  Physical Exam  Constitutional: Oriented to person, place, and time and well-developed, well-nourished, and in no distress. HENT: Head: Normocephalic and atraumatic. Mouth/Throat: Oropharynx is clear and moist. No oropharyngeal exudate. Eyes: Conjunctivae are normal. Right eye exhibits no discharge. Left eye exhibits no discharge. No scleral icterus. Neck: Normal range of motion. Neck supple. Cardiovascular: Normal rate, regular rhythm, normal heart sounds and intact distal pulses.   Pulmonary/Chest: Effort normal and breath sounds normal. No respiratory distress. No wheezes. No rales. Abdominal: Soft. Bowel sounds are normal. Exhibits no distension and no mass. There is no tenderness.  Musculoskeletal: Positive for left lower calf swelling/pain. Normal range of motion.  Lymphadenopathy:    No cervical adenopathy.  Neurological: Alert and oriented to person, place, and time. Exhibits  normal muscle tone. Gait normal. Coordination normal. Skin: Skin is warm and dry. No rash noted. Not diaphoretic. No erythema. No pallor.  Psychiatric: Mood, memory and judgment normal. Vitals reviewed.  LABORATORY DATA: Lab Results  Component Value Date   WBC 7.6 12/22/2020   HGB 12.3 (L) 12/22/2020   HCT 36.7 (L) 12/22/2020   MCV 91.3 12/22/2020   PLT 288 12/22/2020      Chemistry      Component Value Date/Time   NA 138 12/22/2020 0947   NA 141 01/29/2020 1157   K 4.7 12/22/2020 0947   CL 105 12/22/2020 0947   CO2 23 12/22/2020 0947   BUN 17 12/22/2020 0947   BUN 12 01/29/2020 1157   CREATININE 1.05 12/22/2020 0947   CREATININE 1.11 07/26/2016 0921      Component Value Date/Time   CALCIUM 9.1 12/22/2020 0947   ALKPHOS 83 12/22/2020 0947   AST 14 (L) 12/22/2020 0947   ALT 9 12/22/2020 0947   BILITOT 0.5 12/22/2020 0947       RADIOGRAPHIC STUDIES:  NM PET Image Initial (PI) Skull Base To Thigh  Result Date: 12/06/2020 CLINICAL DATA:  Initial treatment strategy for non-small cell lung cancer. EXAM: NUCLEAR MEDICINE PET SKULL BASE TO THIGH TECHNIQUE: 11.77 mCi F-18 FDG was injected intravenously. Full-ring PET imaging was performed from the skull base to thigh after the radiotracer. CT data was obtained and used for attenuation correction and anatomic localization. Fasting blood glucose: 100 mg/dl COMPARISON:  Chest CT 11/17/2020 and neck CT 11/19/2020 FINDINGS: Mediastinal blood pool activity: SUV max 2.16 Liver activity: SUV max NA NECK: The large parotid mass seen on recent neck CT is hypermetabolic with SUV max of 40.08. No associated neck adenopathy. 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 Recommend biopsy. Incidental CT findings: none CHEST: 8 cm left upper lobe lung mass is hypermetabolic with SUV max of 67.61. 14 mm left paratracheal/AP window node is hypermetabolic with SUV max of 9.50. There is also a focus of hypermetabolism in the right paratracheal area at  the level of the carina with SUV max of 4.06. I do not see a definite lymph node in this area. Small subcarinal node is not hypermetabolic. 10 mm left infrahilar node demonstrates mild hypermetabolism with SUV max of 3.96. The peripheral left upper lobe pulmonary lesion anteriorly measures 2.6 cm on image 40/8. This is hypermetabolic with SUV max of 9.32 and could be a metastatic focus or second primary. Small scattered pulmonary nodules are suspicious for pulmonary metastatic disease. No definite hypermetabolism these are quite small. No supraclavicular or axillary adenopathy. Incidental CT findings: none ABDOMEN/PELVIS: Left adrenal gland nodule measuring 15 mm is mildly hypermetabolic with SUV max of 6.71 and  suspicious for small metastasis. No hepatic lesions are identified. No abdominal or pelvic hypermetabolic lymphadenopathy. Incidental CT findings: Moderate aortic calcifications without focal aneurysm. Sigmoid colon diverticulosis. Enlarged prostate gland with median lobe hypertrophy impressing on the base of the bladder. Bilateral inguinal hernias containing fat. SKELETON: No findings suspicious for osseous metastatic disease. Incidental CT findings: none IMPRESSION: 1. 8 cm hypermetabolic mass in the left upper lobe consistent with known neoplasm. 2. Second smaller more peripheral lesion anterior left upper lobe is also hypermetabolic and could be a metastatic focus or second primary. 3. Small scattered pulmonary nodules suspicious for pulmonary metastatic disease. 4. Left hilar and mediastinal metastatic adenopathy. 5. Small left adrenal gland nodule is hypermetabolic and suspicious for a metastasis. 6. 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 7. No findings for osseous metastatic disease. Electronically Signed   By: Marijo Sanes M.D.   On: 12/06/2020 19:02     ASSESSMENT/PLAN:  This is a very pleasant 85 year old Caucasian male diagnosed with  stage IV (T3, N2, M1 C) non-small cell lung cancer, squamous cell carcinoma.  He presented with a large left upper lobe lung mass in addition to left hilar mediastinal lymphadenopathy, scattered bilateral pulmonary nodules, and a small lesion in the left adrenal gland suspicious for metastatic disease.  The patient was diagnosed in May 2022.   He is currently undergoing palliative systemic chemotherapy with carboplatin for an AUC of 5, paclitaxel 175 mg per metered squared, and Keytruda 200 mg IV every 3 weeks with Neulasta support. He is here for his first dose today.   For his lower extremity swelling, we will arrange for a lower extremity doppler to be performed in the next few days. The patient wanted to know if this could wait but I discussed I would like this to be performed in the next few days to ensure no DVT.   I will personally call the patient with the results.   We will see him back for a follow up visit in 1 week for evaluation and for a one week follow up visit to see how he tolerated his first cycle of treatment.   The patient was advised to call immediately if she has any concerning symptoms in the interval. The patient voices understanding of current disease status and treatment options and is in agreement with the current care plan. All questions were answered. The patient knows to call the clinic with any problems, questions or concerns. We can certainly see the patient much sooner if necessary        No orders of the defined types were placed in this encounter.    The total time spent in the appointment was 20-29 minutes in this encounter.   Klaus Casteneda L Alexander Mcauley, PA-C 12/22/20

## 2020-12-22 NOTE — Progress Notes (Signed)
Pt is approved for the $1000 [employer] grant.  

## 2020-12-22 NOTE — Patient Instructions (Addendum)
[facility]  Discharge Instructions: Thank you for choosing [facility] to provide your oncology and hematology care.   If you have a lab appointment with the [facility], please go directly to the [facility] and check in at the registration area.   Wear comfortable clothing and clothing appropriate for easy access to any Portacath or PICC line.   We strive to give you quality time with your provider. You may need to reschedule your appointment if you arrive late (15 or more minutes).  Arriving late affects you and other patients whose appointments are after yours.  Also, if you miss three or more appointments without notifying the office, you may be dismissed from the clinic at the provider's discretion.      For prescription refill requests, have your pharmacy contact our office and allow 72 hours for refills to be completed.    Today you received the following chemotherapy and/or immunotherapy agents: Keytruda, paclitaxel & carboplatin.    To help prevent nausea and vomiting after your treatment, we encourage you to take your nausea medication as directed.  BELOW ARE SYMPTOMS THAT SHOULD BE REPORTED IMMEDIATELY: *FEVER GREATER THAN 100.4 F (38 C) OR HIGHER *CHILLS OR SWEATING *NAUSEA AND VOMITING THAT IS NOT CONTROLLED WITH YOUR NAUSEA MEDICATION *UNUSUAL SHORTNESS OF BREATH *UNUSUAL BRUISING OR BLEEDING *URINARY PROBLEMS (pain or burning when urinating, or frequent urination) *BOWEL PROBLEMS (unusual diarrhea, constipation, pain near the anus) TENDERNESS IN MOUTH AND THROAT WITH OR WITHOUT PRESENCE OF ULCERS (sore throat, sores in mouth, or a toothache) UNUSUAL RASH, SWELLING OR PAIN  UNUSUAL VAGINAL DISCHARGE OR ITCHING   Items with * indicate a potential emergency and should be followed up as soon as possible or go to the Emergency Department if any problems should occur.  Please show the CHEMOTHERAPY ALERT CARD or IMMUNOTHERAPY  ALERT CARD at check-in to the Emergency Department and triage nurse.  Should you have questions after your visit or need to cancel or reschedule your appointment, please contact [facility]  Dept: [phone]  and follow the prompts.  Office hours are 8:00 a.m. to 4:30 p.m. Monday - Friday. Please note that voicemails left after 4:00 p.m. may not be returned until the following business day.  We are closed weekends and major holidays. You have access to a nurse at all times for urgent questions. Please call the main number to the clinic Dept: [phone] and follow the prompts.   For any non-urgent questions, you may also contact your provider using MyChart. We now offer e-Visits for anyone 22 and older to request care online for non-urgent symptoms. For details visit [email or web address].   Also download the MyChart app! Go to the app store, search "MyChart", open the app, select , and log in with your MyChart username and password.  Due to Covid, a mask is required upon entering the hospital/clinic. If you do not have a mask, one will be given to you upon arrival. For doctor visits, patients may have 1 support person aged 36 or older with them. For treatment visits, patients cannot have anyone with them due to current Covid guidelines and our immunocompromised population.   Pembrolizumab injection What is this medication? PEMBROLIZUMAB (pem broe liz ue mab) is a monoclonal antibody. It is used totreat certain types of cancer. This medicine may be used for other purposes; ask your health care provider orpharmacist if you have questions. COMMON BRAND NAME(S): Keytruda What should  I tell my care team before I take this medication? They need to know if you have any of these conditions: autoimmune diseases like Crohn's disease, ulcerative colitis, or lupus have had or planning to have an allogeneic stem cell transplant (uses someone else's stem  cells) history of organ transplant history of chest radiation nervous system problems like myasthenia gravis or Guillain-Barre syndrome an unusual or allergic reaction to pembrolizumab, other medicines, foods, dyes, or preservatives pregnant or trying to get pregnant breast-feeding How should I use this medication? This medicine is for infusion into a vein. It is given by a health careprofessional in a hospital or clinic setting. A special MedGuide will be given to you before each treatment. Be sure to readthis information carefully each time. Talk to your pediatrician regarding the use of this medicine in children. While this drug may be prescribed for children as young as 6 months for selectedconditions, precautions do apply. Overdosage: If you think you have taken too much of this medicine contact apoison control center or emergency room at once. NOTE: This medicine is only for you. Do not share this medicine with others. What if I miss a dose? It is important not to miss your dose. Call your doctor or health careprofessional if you are unable to keep an appointment. What may interact with this medication? Interactions have not been studied. This list may not describe all possible interactions. Give your health care provider a list of all the medicines, herbs, non-prescription drugs, or dietary supplements you use. Also tell them if you smoke, drink alcohol, or use illegaldrugs. Some items may interact with your medicine. What should I watch for while using this medication? Your condition will be monitored carefully while you are receiving thismedicine. You may need blood work done while you are taking this medicine. Do not become pregnant while taking this medicine or for 4 months after stopping it. Women should inform their doctor if they wish to become pregnant or think they might be pregnant. There is a potential for serious side effects to an unborn child. Talk to your health care  professional or pharmacist for more information. Do not breast-feed an infant while taking this medicine orfor 4 months after the last dose. What side effects may I notice from receiving this medication? Side effects that you should report to your doctor or health care professionalas soon as possible: allergic reactions like skin rash, itching or hives, swelling of the face, lips, or tongue bloody or black, tarry breathing problems changes in vision chest pain chills confusion constipation cough diarrhea dizziness or feeling faint or lightheaded fast or irregular heartbeat fever flushing joint pain low blood counts - this medicine may decrease the number of white blood cells, red blood cells and platelets. You may be at increased risk for infections and bleeding. muscle pain muscle weakness pain, tingling, numbness in the hands or feet persistent headache redness, blistering, peeling or loosening of the skin, including inside the mouth signs and symptoms of high blood sugar such as dizziness; dry mouth; dry skin; fruity breath; nausea; stomach pain; increased hunger or thirst; increased urination signs and symptoms of kidney injury like trouble passing urine or change in the amount of urine signs and symptoms of liver injury like dark urine, light-colored stools, loss of appetite, nausea, right upper belly pain, yellowing of the eyes or skin sweating swollen lymph nodes weight loss Side effects that usually do not require medical attention (report to yourdoctor or health care professional if they  continue or are bothersome): decreased appetite hair loss tiredness This list may not describe all possible side effects. Call your doctor for medical advice about side effects. You may report side effects to FDA at1-800-FDA-1088. Where should I keep my medication? This drug is given in a hospital or clinic and will not be stored at home. NOTE: This sheet is a summary. It may not cover  all possible information. If you have questions about this medicine, talk to your doctor, pharmacist, orhealth care provider.  2022 Elsevier/Gold Standard (2019-05-16 21:44:53)   Paclitaxel injection What is this medication? PACLITAXEL (PAK li TAX el) is a chemotherapy drug. It targets fast dividing cells, like cancer cells, and causes these cells to die. This medicine is used to treat ovarian cancer, breast cancer, lung cancer, Kaposi's sarcoma, andother cancers. This medicine may be used for other purposes; ask your health care provider orpharmacist if you have questions. COMMON BRAND NAME(S): Onxol, Taxol What should I tell my care team before I take this medication? They need to know if you have any of these conditions: history of irregular heartbeat liver disease low blood counts, like low white cell, platelet, or red cell counts lung or breathing disease, like asthma tingling of the fingers or toes, or other nerve disorder an unusual or allergic reaction to paclitaxel, alcohol, polyoxyethylated castor oil, other chemotherapy, other medicines, foods, dyes, or preservatives pregnant or trying to get pregnant breast-feeding How should I use this medication? This drug is given as an infusion into a vein. It is administered in a hospitalor clinic by a specially trained health care professional. Talk to your pediatrician regarding the use of this medicine in children.Special care may be needed. Overdosage: If you think you have taken too much of this medicine contact apoison control center or emergency room at once. NOTE: This medicine is only for you. Do not share this medicine with others. What if I miss a dose? It is important not to miss your dose. Call your doctor or health careprofessional if you are unable to keep an appointment. What may interact with this medication? Do not take this medicine with any of the following medications: live virus vaccines This medicine may also  interact with the following medications: antiviral medicines for hepatitis, HIV or AIDS certain antibiotics like erythromycin and clarithromycin certain medicines for fungal infections like ketoconazole and itraconazole certain medicines for seizures like carbamazepine, phenobarbital, phenytoin gemfibrozil nefazodone rifampin St. John's wort This list may not describe all possible interactions. Give your health care provider a list of all the medicines, herbs, non-prescription drugs, or dietary supplements you use. Also tell them if you smoke, drink alcohol, or use illegaldrugs. Some items may interact with your medicine. What should I watch for while using this medication? Your condition will be monitored carefully while you are receiving this medicine. You will need important blood work done while you are taking thismedicine. This medicine can cause serious allergic reactions. To reduce your risk you will need to take other medicine(s) before treatment with this medicine. If you experience allergic reactions like skin rash, itching or hives, swelling of theface, lips, or tongue, tell your doctor or health care professional right away. In some cases, you may be given additional medicines to help with side effects.Follow all directions for their use. This drug may make you feel generally unwell. This is not uncommon, as chemotherapy can affect healthy cells as well as cancer cells. Report any side effects. Continue your course of treatment even though  you feel ill unless yourdoctor tells you to stop. Call your doctor or health care professional for advice if you get a fever, chills or sore throat, or other symptoms of a cold or flu. Do not treat yourself. This drug decreases your body's ability to fight infections. Try toavoid being around people who are sick. This medicine may increase your risk to bruise or bleed. Call your doctor orhealth care professional if you notice any unusual bleeding. Be  careful brushing and flossing your teeth or using a toothpick because you may get an infection or bleed more easily. If you have any dental work done,tell your dentist you are receiving this medicine. Avoid taking products that contain aspirin, acetaminophen, ibuprofen, naproxen, or ketoprofen unless instructed by your doctor. These medicines may hide afever. Do not become pregnant while taking this medicine. Women should inform their doctor if they wish to become pregnant or think they might be pregnant. There is a potential for serious side effects to an unborn child. Talk to your health care professional or pharmacist for more information. Do not breast-feed aninfant while taking this medicine. Men are advised not to father a child while receiving this medicine. This product may contain alcohol. Ask your pharmacist or healthcare provider if this medicine contains alcohol. Be sure to tell all healthcare providers you are taking this medicine. Certain medicines, like metronidazole and disulfiram, can cause an unpleasant reaction when taken with alcohol. The reaction includes flushing, headache, nausea, vomiting, sweating, and increased thirst. Thereaction can last from 30 minutes to several hours. What side effects may I notice from receiving this medication? Side effects that you should report to your doctor or health care professionalas soon as possible: allergic reactions like skin rash, itching or hives, swelling of the face, lips, or tongue breathing problems changes in vision fast, irregular heartbeat high or low blood pressure mouth sores pain, tingling, numbness in the hands or feet signs of decreased platelets or bleeding - bruising, pinpoint red spots on the skin, black, tarry stools, blood in the urine signs of decreased red blood cells - unusually weak or tired, feeling faint or lightheaded, falls signs of infection - fever or chills, cough, sore throat, pain or difficulty passing  urine signs and symptoms of liver injury like dark yellow or brown urine; general ill feeling or flu-like symptoms; light-colored stools; loss of appetite; nausea; right upper belly pain; unusually weak or tired; yellowing of the eyes or skin swelling of the ankles, feet, hands unusually slow heartbeat Side effects that usually do not require medical attention (report to yourdoctor or health care professional if they continue or are bothersome): diarrhea hair loss loss of appetite muscle or joint pain nausea, vomiting pain, redness, or irritation at site where injected tiredness This list may not describe all possible side effects. Call your doctor for medical advice about side effects. You may report side effects to FDA at1-800-FDA-1088. Where should I keep my medication? This drug is given in a hospital or clinic and will not be stored at home. NOTE: This sheet is a summary. It may not cover all possible information. If you have questions about this medicine, talk to your doctor, pharmacist, orhealth care provider.  2022 Elsevier/Gold Standard (2019-05-16 13:37:23)  Carboplatin injection What is this medication? CARBOPLATIN (KAR boe pla tin) is a chemotherapy drug. It targets fast dividing cells, like cancer cells, and causes these cells to die. This medicine is usedto treat ovarian cancer and many other cancers. This medicine may be  used for other purposes; ask your health care provider orpharmacist if you have questions. COMMON BRAND NAME(S): Paraplatin What should I tell my care team before I take this medication? They need to know if you have any of these conditions: blood disorders hearing problems kidney disease recent or ongoing radiation therapy an unusual or allergic reaction to carboplatin, cisplatin, other chemotherapy, other medicines, foods, dyes, or preservatives pregnant or trying to get pregnant breast-feeding How should I use this medication? This drug is usually  given as an infusion into a vein. It is administered in [facility] or clinic by a specially trained health care professional. Talk to your pediatrician regarding the use of this medicine in children.Special care may be needed. Overdosage: If you think you have taken too much of this medicine contact apoison control center or emergency room at once. NOTE: This medicine is only for you. Do not share this medicine with others. What if I miss a dose? It is important not to miss a dose. Call your doctor or health careprofessional if you are unable to keep an appointment. What may interact with this medication? medicines for seizures medicines to increase blood counts like filgrastim, pegfilgrastim, sargramostim some antibiotics like amikacin, gentamicin, neomycin, streptomycin, tobramycin vaccines Talk to your doctor or health care professional before taking any of thesemedicines: acetaminophen aspirin ibuprofen ketoprofen naproxen This list may not describe all possible interactions. Give your health care provider a list of all the medicines, herbs, non-prescription drugs, or dietary supplements you use. Also tell them if you smoke, drink alcohol, or use illegaldrugs. Some items may interact with your medicine. What should I watch for while using this medication? Your condition will be monitored carefully while you are receiving this medicine. You will need important blood work done while you are taking thismedicine. This drug may make you feel generally unwell. This is not uncommon, as chemotherapy can affect healthy cells as well as cancer cells. Report any side effects. Continue your course of treatment even though you feel ill unless yourdoctor tells you to stop. In some cases, you may be given additional medicines to help with side effects.Follow all directions for their use. Call your doctor or health care professional for advice if you get a fever, chills or sore throat, or other symptoms of  a cold or flu. Do not treat yourself. This drug decreases your body's ability to fight infections. Try toavoid being around people who are sick. This medicine may increase your risk to bruise or bleed. Call your doctor orhealth care professional if you notice any unusual bleeding. Be careful brushing and flossing your teeth or using a toothpick because you may get an infection or bleed more easily. If you have any dental work done,tell your dentist you are receiving this medicine. Avoid taking products that contain aspirin, acetaminophen, ibuprofen, naproxen, or ketoprofen unless instructed by your doctor. These medicines may hide afever. Do not become pregnant while taking this medicine. Women should inform their doctor if they wish to become pregnant or think they might be pregnant. There is a potential for serious side effects to an unborn child. Talk to your health care professional or pharmacist for more information. Do not breast-feed aninfant while taking this medicine. What side effects may I notice from receiving this medication? Side effects that you should report to your doctor or health care professionalas soon as possible: allergic reactions like skin rash, itching or hives, swelling of the face, lips, or tongue signs of infection - fever or  chills, cough, sore throat, pain or difficulty passing urine signs of decreased platelets or bleeding - bruising, pinpoint red spots on the skin, black, tarry stools, nosebleeds signs of decreased red blood cells - unusually weak or tired, fainting spells, lightheadedness breathing problems changes in hearing changes in vision chest pain high blood pressure low blood counts - This drug may decrease the number of white blood cells, red blood cells and platelets. You may be at increased risk for infections and bleeding. nausea and vomiting pain, swelling, redness or irritation at the injection site pain, tingling, numbness in the hands or  feet problems with balance, talking, walking trouble passing urine or change in the amount of urine Side effects that usually do not require medical attention (report to yourdoctor or health care professional if they continue or are bothersome): hair loss loss of appetite metallic taste in the mouth or changes in taste This list may not describe all possible side effects. Call your doctor for medical advice about side effects. You may report side effects to FDA at1-800-FDA-1088. Where should I keep my medication? This drug is given in a hospital or clinic and will not be stored at home. NOTE: This sheet is a summary. It may not cover all possible information. If you have questions about this medicine, talk to your doctor, pharmacist, orhealth care provider.  2022 Elsevier/Gold Standard (2007-09-19 14:38:05)

## 2020-12-23 ENCOUNTER — Inpatient Hospital Stay: Payer: Medicare Other | Admitting: Internal Medicine

## 2020-12-23 ENCOUNTER — Ambulatory Visit (HOSPITAL_COMMUNITY)
Admission: RE | Admit: 2020-12-23 | Discharge: 2020-12-23 | Disposition: A | Discharge status: Home / Self Care | Payer: Medicare Other | Source: Ambulatory Visit | Attending: Physician Assistant | Admitting: Physician Assistant

## 2020-12-23 ENCOUNTER — Telehealth: Payer: Self-pay | Admitting: Physician Assistant

## 2020-12-23 DIAGNOSIS — M7989 Other specified soft tissue disorders: Secondary | ICD-10-CM | POA: Insufficient documentation

## 2020-12-23 NOTE — Telephone Encounter (Signed)
The patient's ultrasound was negative for DVT. I called the patient to relay this information to him. He stated that his swelling and pain improved today. He also let me know he did well with his first infusion yesterday. I told him not to hesitate to call us if he has any concerning complaints. We are scheduled to see him for a 1 week follow up visit next week. He was advised to use compression stockings, elevate his legs, and reduce his salt intake should he have worsening swelling.

## 2020-12-23 NOTE — Progress Notes (Signed)
Right lower extremity venous duplex has been completed. Preliminary results can be found in CV Proc through chart review.  Results were given to [place] PA.  12/23/20 9:41 AM Carlos Levering RVT

## 2020-12-24 ENCOUNTER — Inpatient Hospital Stay: Payer: Medicare Other

## 2020-12-24 ENCOUNTER — Other Ambulatory Visit: Payer: Self-pay

## 2020-12-24 DIAGNOSIS — Z86010 Personal history of colonic polyps: Secondary | ICD-10-CM | POA: Diagnosis not present

## 2020-12-24 DIAGNOSIS — C7972 Secondary malignant neoplasm of left adrenal gland: Secondary | ICD-10-CM | POA: Diagnosis not present

## 2020-12-24 DIAGNOSIS — M4312 Spondylolisthesis, cervical region: Secondary | ICD-10-CM | POA: Diagnosis not present

## 2020-12-24 DIAGNOSIS — C3412 Malignant neoplasm of upper lobe, left bronchus or lung: Secondary | ICD-10-CM | POA: Diagnosis not present

## 2020-12-24 DIAGNOSIS — Z79899 Other long term (current) drug therapy: Secondary | ICD-10-CM | POA: Diagnosis not present

## 2020-12-24 DIAGNOSIS — Z87891 Personal history of nicotine dependence: Secondary | ICD-10-CM | POA: Diagnosis not present

## 2020-12-24 DIAGNOSIS — C3492 Malignant neoplasm of unspecified part of left bronchus or lung: Secondary | ICD-10-CM

## 2020-12-24 DIAGNOSIS — Z5189 Encounter for other specified aftercare: Secondary | ICD-10-CM | POA: Diagnosis not present

## 2020-12-24 DIAGNOSIS — E785 Hyperlipidemia, unspecified: Secondary | ICD-10-CM | POA: Diagnosis not present

## 2020-12-24 DIAGNOSIS — I10 Essential (primary) hypertension: Secondary | ICD-10-CM | POA: Diagnosis not present

## 2020-12-24 DIAGNOSIS — K5730 Diverticulosis of large intestine without perforation or abscess without bleeding: Secondary | ICD-10-CM | POA: Diagnosis not present

## 2020-12-24 DIAGNOSIS — R0602 Shortness of breath: Secondary | ICD-10-CM | POA: Diagnosis not present

## 2020-12-24 DIAGNOSIS — R5383 Other fatigue: Secondary | ICD-10-CM | POA: Diagnosis not present

## 2020-12-24 DIAGNOSIS — Z85828 Personal history of other malignant neoplasm of skin: Secondary | ICD-10-CM | POA: Diagnosis not present

## 2020-12-24 DIAGNOSIS — I700 Atherosclerosis of aorta: Secondary | ICD-10-CM | POA: Diagnosis not present

## 2020-12-24 DIAGNOSIS — R590 Localized enlarged lymph nodes: Secondary | ICD-10-CM | POA: Diagnosis not present

## 2020-12-24 DIAGNOSIS — E119 Type 2 diabetes mellitus without complications: Secondary | ICD-10-CM | POA: Diagnosis not present

## 2020-12-24 MED ORDER — PEGFILGRASTIM-BMEZ 6 MG/0.6ML ~~LOC~~ SOSY
6.0000 mg | PREFILLED_SYRINGE | Freq: Once | SUBCUTANEOUS | Status: AC
  Administered 2020-12-24: 6 mg via SUBCUTANEOUS

## 2020-12-24 MED ORDER — PEGFILGRASTIM-BMEZ 6 MG/0.6ML ~~LOC~~ SOSY
PREFILLED_SYRINGE | SUBCUTANEOUS | Status: AC
  Filled 2020-12-24: qty 0.6

## 2020-12-24 NOTE — Patient Instructions (Signed)

## 2020-12-25 NOTE — Progress Notes (Signed)
[place] OFFICE PROGRESS NOTE  [name], MD [place] [address]  DIAGNOSIS: Stage IV (T3, N2, M1c) small cell lung cancer, squamous cell carcinoma.  He presented with a large left upper lobe lung mass in addition to left hilar and mediastinal lymphadenopathy and bilateral pulmonary nodules concerning for metastatic disease and a small left adrenal gland lesion.  The patient was diagnosed in May 2022.  PRIOR THERAPY: None  CURRENT THERAPY: Palliative systemic chemotherapy with carboplatin for an AUC of 5, paclitaxel 175 mg per metered squared, Keytruda 200 mg IV every 3 weeks.  First dose expected on 12/22/2020. Status post 1 cycle.   INTERVAL HISTORY: Alex Edwards 85 y.o. male returns to the clinic today for a follow-up visit accompanied by his wife. The patient is feeling well today without any concerning complaints except for some increase in the shortness of breath and productive cough in the past 2-3 days. He states the last few mornings he has been coughing more with increased phlegm. He describes it as mostly dark yellow in color although he endorses some "blood tinge." The patient has previously had hemoptysis which is what led him to his current diagnosis. He reports having to use his home oxygen more in the last few days in the morning after coughing or at night, and it is on 3 liters. He reports worsening shortness of breath with exertion as he has needed his oxygen after walking around the grocery store. He states he monitored his O2 saturation at home and it had dropped to 92%. He is well appearing. He denies sick contacts. He denies associated fevers or chills.   The patient underwent his first cycle of palliative systemic chemotherapy last week which he tolerated well without any concerning adverse side effects. He reports having one episode of diarrhea the night of his treatment which then resolved. Following his Neulasta shot, the  patient reports significant muscle aches and back pain for which he was taking Tylenol. He does take a daily generic loratadine daily as well. These symptoms resolved after a couple of days. At the patient's last appointment, he was endorsing lower extremity swelling which he initially attributed to his Norvasc.  He had a Doppler ultrasound performed which was negative for DVT. His swelling has resolved at this time.   Today, the patient's fatigue is stable and his appetite is maintained. He denies any night sweats or weight loss.  He denies any chest pain.  He denies any nausea or vomiting. He reports some baseline constipation which he manages on his own although he does not take anything for it.  He denies any headache or visual changes besides his baseline visual changes from macular degeneration. He reports ongoing bilateral conjunctivitis including watery eyes and some "mucus." When he was last in was in the hospital, he reports he had this and has been using antibiotic drops. He denies his eyes being itchy and states the drops are helping, but he is not sure what is causing the conjunctivitis. He denies any eye pain. His only reported skin changes are a couple marks on his arms which he attributes to a possible injuries including one small burn. He denies any rashes. The patient is here today for evaluation and a 1 week follow-up visit to manage any adverse side effects of treatment.  MEDICAL HISTORY: Past Medical History:  Diagnosis Date   BCE (basal cell epithelioma)    Bruises easily    on hands  Cough    smokers cough or perfurmes   Degeneration macular    right eye   Diabetes mellitus without complication (HCC)    Dyslipidemia    ED (erectile dysfunction)    Hx of adenomatous colonic polyps    Hyperlipidemia    Hypertension    borderline   Obesity    Psoriasis    Seasonal allergies    Smoker    former    ALLERGIES:  is allergic to vancomycin, amlodipine, and  latex.  MEDICATIONS:  Current Outpatient Medications  Medication Sig Dispense Refill   Ascorbic Acid (VITAMIN C) 1000 MG tablet Take 1,000 mg by mouth daily.     fexofenadine (ALLEGRA) 180 MG tablet Take 180 mg by mouth daily.     ketoconazole (NIZORAL) 2 % cream APPLY TO THE AFFECTED AREA ONCE A DAY. (Patient taking differently: 1 application daily as needed for irritation.) 60 g 0   losartan (COZAAR) 100 MG tablet Take 1 tablet (100 mg total) by mouth daily. 30 tablet 0   metoprolol tartrate (LOPRESSOR) 50 MG tablet TAKE ONE TABLET BY MOUTH TWICE DAILY. (Patient taking differently: Take 50 mg by mouth 2 (two) times daily.) 180 tablet 0   Multiple Vitamin (MULTI VITAMIN DAILY PO) Take 1 tablet by mouth daily.     Multiple Vitamins-Minerals (PRESERVISION AREDS 2) CAPS Take 1 capsule by mouth in the morning and at bedtime.     [place] Patient is to test one time a day DX: E11.9 100 each 4   ONETOUCH VERIO test strip USE TO TEST BLOOD SUGAR ONCE DAILY. 50 strip 2   prochlorperazine (COMPAZINE) 10 MG tablet Take 1 tablet (10 mg total) by mouth every 6 (six) hours as needed for nausea or vomiting. 30 tablet 0   tamsulosin (FLOMAX) 0.4 MG CAPS capsule Take 1 capsule (0.4 mg total) by mouth daily after supper. 30 capsule 3   triamcinolone cream (KENALOG) 0.1 % APPLY TO AFFECTED AREA TWICE A DAY. (Patient taking differently: Apply 1 application topically 2 (two) times daily as needed (itching).) 454 g 0   No current facility-administered medications for this visit.    SURGICAL HISTORY:  Past Surgical History:  Procedure Laterality Date   BRONCHIAL BRUSHINGS  11/18/2020   Procedure: BRONCHIAL BRUSHINGS;  Surgeon: Spero Geralds, MD;  Location: [facility];  Service: Pulmonary;;   BRONCHIAL NEEDLE ASPIRATION BIOPSY  11/18/2020   Procedure: BRONCHIAL NEEDLE ASPIRATION BIOPSIES;  Surgeon: Spero Geralds, MD;  Location: [facility];  Service: Pulmonary;;   BRONCHIAL  WASHINGS  11/18/2020   Procedure: BRONCHIAL WASHINGS;  Surgeon: Spero Geralds, MD;  Location: [facility];  Service: Pulmonary;;   CATARACT EXTRACTION, BILATERAL Bilateral 2013   COLONOSCOPY  2005   [facility]   ENDOBRONCHIAL ULTRASOUND N/A 11/18/2020   Procedure: ENDOBRONCHIAL ULTRASOUND;  Surgeon: Spero Geralds, MD;  Location: [facility];  Service: Pulmonary;  Laterality: N/A;   POLYPECTOMY     SKIN CANCER EXCISION Right 1993   under eye-MOHS, freeze multiple places freq   TONSILLECTOMY  as child   TOTAL HIP ARTHROPLASTY Left 01/04/2014   Procedure: LEFT TOTAL HIP ARTHROPLASTY ANTERIOR APPROACH;  Surgeon: Mcarthur Rossetti, MD;  Location: [facility];  Service: Orthopedics;  Laterality: Left;   TOTAL HIP ARTHROPLASTY Left 02/14/2014   Procedure: Irrigation and Debridement left hip;  Surgeon: Mcarthur Rossetti, MD;  Location: [facility];  Service: Orthopedics;  Laterality: Left;    REVIEW OF SYSTEMS:   Review  of Systems  Constitutional: Positive fatigue, Negative for appetite change, chills, fever and unexpected weight change.  HENT: Negative for mouth sores, nosebleeds, sore throat and trouble swallowing.   Eyes: Positive watery eyes, reported discharge. Negative icterus.  Respiratory: Positive cough, mild hemoptysis, and shortness of breath. Negative for wheezing.   Cardiovascular: Negative for chest pain and leg swelling.  Gastrointestinal: Positive diarrhea (revolved) and constipation. Negative for abdominal pain, nausea and vomiting.  Genitourinary: Negative for bladder incontinence, difficulty urinating, dysuria, frequency and hematuria.   Musculoskeletal: Negative for back pain, gait problem, neck pain and neck stiffness.  Skin: Negative for itching and rash.  Neurological: Negative for dizziness, extremity weakness, gait problem, headaches, light-headedness and seizures.  Hematological: Negative for adenopathy. Does not bruise/bleed easily.  Psychiatric/Behavioral: Negative  for confusion, depression and sleep disturbance. The patient is not nervous/anxious.     PHYSICAL EXAMINATION:  There were no vitals taken for this visit.  ECOG PERFORMANCE STATUS: 1  Physical Exam  Constitutional: Oriented to person, place, and time and well-developed, well-nourished, and in no distress.  HENT:  Head: Normocephalic and atraumatic.  Mouth/Throat: Oropharynx is clear and moist. No oropharyngeal exudate.  Eyes: Conjunctivae are normal. Right eye exhibits no discharge. Left eye exhibits no discharge. No scleral icterus.  Neck: Normal range of motion. Neck supple.  Cardiovascular: Normal rate, regular rhythm, normal heart sounds and intact distal pulses.   Pulmonary/Chest: Effort normal and breath sounds normal. No respiratory distress. No wheezes. No rales.  Abdominal: Soft. Bowel sounds are normal. Exhibits no distension and no mass. There is no tenderness.  Musculoskeletal: Normal range of motion. Exhibits no edema.  Lymphadenopathy:    No cervical adenopathy.  Neurological: Alert and oriented to person, place, and time. Exhibits normal muscle tone. Gait normal. Coordination normal.  Skin: Skin is warm and dry. No rash noted. Not diaphoretic. No erythema. No pallor.  Psychiatric: Mood, memory and judgment normal.  Vitals reviewed.  LABORATORY DATA: Lab Results  Component Value Date   WBC 7.6 12/22/2020   HGB 12.3 (L) 12/22/2020   HCT 36.7 (L) 12/22/2020   MCV 91.3 12/22/2020   PLT 288 12/22/2020      Chemistry      Component Value Date/Time   NA 138 12/22/2020 0947   NA 141 01/29/2020 1157   K 4.7 12/22/2020 0947   CL 105 12/22/2020 0947   CO2 23 12/22/2020 0947   BUN 17 12/22/2020 0947   BUN 12 01/29/2020 1157   CREATININE 1.05 12/22/2020 0947   CREATININE 1.11 07/26/2016 0921      Component Value Date/Time   CALCIUM 9.1 12/22/2020 0947   ALKPHOS 83 12/22/2020 0947   AST 14 (L) 12/22/2020 0947   ALT 9 12/22/2020 0947   BILITOT 0.5 12/22/2020 0947        RADIOGRAPHIC STUDIES:  NM PET Image Initial (PI) Skull Base To Thigh  Result Date: 12/06/2020 CLINICAL DATA:  Initial treatment strategy for non-small cell lung cancer. EXAM: NUCLEAR MEDICINE PET SKULL BASE TO THIGH TECHNIQUE: 11.77 mCi F-18 FDG was injected intravenously. Full-ring PET imaging was performed from the skull base to thigh after the radiotracer. CT data was obtained and used for attenuation correction and anatomic localization. Fasting blood glucose: 100 mg/dl COMPARISON:  Chest CT 11/17/2020 and neck CT 11/19/2020 FINDINGS: Mediastinal blood pool activity: SUV max 2.16 Liver activity: SUV max NA NECK: The large parotid mass seen on recent neck CT is hypermetabolic with SUV max of 16.10. No associated neck adenopathy.  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 Recommend biopsy. Incidental CT findings: none CHEST: 8 cm left upper lobe lung mass is hypermetabolic with SUV max of 70.26. 14 mm left paratracheal/AP window node is hypermetabolic with SUV max of 3.78. There is also a focus of hypermetabolism in the right paratracheal area at the level of the carina with SUV max of 4.06. I do not see a definite lymph node in this area. Small subcarinal node is not hypermetabolic. 10 mm left infrahilar node demonstrates mild hypermetabolism with SUV max of 3.96. The peripheral left upper lobe pulmonary lesion anteriorly measures 2.6 cm on image 40/8. This is hypermetabolic with SUV max of 5.88 and could be a metastatic focus or second primary. Small scattered pulmonary nodules are suspicious for pulmonary metastatic disease. No definite hypermetabolism these are quite small. No supraclavicular or axillary adenopathy. Incidental CT findings: none ABDOMEN/PELVIS: Left adrenal gland nodule measuring 15 mm is mildly hypermetabolic with SUV max of 5.02 and suspicious for small metastasis. No hepatic lesions are identified. No abdominal or pelvic hypermetabolic lymphadenopathy. Incidental CT  findings: Moderate aortic calcifications without focal aneurysm. Sigmoid colon diverticulosis. Enlarged prostate gland with median lobe hypertrophy impressing on the base of the bladder. Bilateral inguinal hernias containing fat. SKELETON: No findings suspicious for osseous metastatic disease. Incidental CT findings: none IMPRESSION: 1. 8 cm hypermetabolic mass in the left upper lobe consistent with known neoplasm. 2. Second smaller more peripheral lesion anterior left upper lobe is also hypermetabolic and could be a metastatic focus or second primary. 3. Small scattered pulmonary nodules suspicious for pulmonary metastatic disease. 4. Left hilar and mediastinal metastatic adenopathy. 5. Small left adrenal gland nodule is hypermetabolic and suspicious for a metastasis. 6. 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 7. No findings for osseous metastatic disease. Electronically Signed   By: Marijo Sanes M.D.   On: 12/06/2020 19:02   VAS [place] LOWER EXTREMITY VENOUS (DVT)  Result Date: 12/23/2020  Lower Venous DVT Study Patient Name:  LAQUINCY EASTRIDGE Edwards  Date of Exam:   12/23/2020 Medical Rec #: [id number]        Accession #:    [id number] Date of Birth: 05-26-1936         Patient Gender: M Patient Age:   85Y Exam [facility] Procedure:      VAS [place] LOWER EXTREMITY VENOUS (DVT) Referring Phys: [id number] [place] --------------------------------------------------------------------------------  Indications: Swelling.  Risk Factors: Cancer. Limitations: Poor ultrasound/tissue interface. Comparison Study: No prior studies. Performing Technologist: Oliver Hum RVT  Examination Guidelines: A complete evaluation includes B-mode imaging, spectral Doppler, color Doppler, and power Doppler as needed of all accessible portions of each vessel. Bilateral testing is considered an integral part of a complete examination. Limited examinations for  reoccurring indications may be performed as noted. The reflux portion of the exam is performed with the patient in reverse Trendelenburg.  +-----+---------------+---------+-----------+----------+--------------+ RIGHTCompressibilityPhasicitySpontaneityPropertiesThrombus Aging +-----+---------------+---------+-----------+----------+--------------+ CFV  Full           Yes      Yes                                 +-----+---------------+---------+-----------+----------+--------------+   +---------+---------------+---------+-----------+----------+--------------+ LEFT     CompressibilityPhasicitySpontaneityPropertiesThrombus Aging +---------+---------------+---------+-----------+----------+--------------+ CFV      Full           Yes  Yes                                 +---------+---------------+---------+-----------+----------+--------------+ SFJ      Full                                                        +---------+---------------+---------+-----------+----------+--------------+ FV Prox  Full                                                        +---------+---------------+---------+-----------+----------+--------------+ FV Mid   Full                                                        +---------+---------------+---------+-----------+----------+--------------+ FV DistalFull                                                        +---------+---------------+---------+-----------+----------+--------------+ PFV      Full                                                        +---------+---------------+---------+-----------+----------+--------------+ POP      Full           Yes      Yes                                 +---------+---------------+---------+-----------+----------+--------------+ PTV      Full                                                        +---------+---------------+---------+-----------+----------+--------------+  PERO     Full                                                        +---------+---------------+---------+-----------+----------+--------------+    Summary: RIGHT: - No evidence of common femoral vein obstruction.  LEFT: - There is no evidence of deep vein thrombosis in the lower extremity.  - No cystic structure found in the popliteal fossa.  *See table(s) above for measurements and observations. Electronically signed by Deitra Mayo MD on 12/23/2020 at 12:18:49 PM.    Final      ASSESSMENT/PLAN:  This is a very pleasant 85 year old Caucasian male diagnosed with  stage IV (T3, N2, M1 C) non-small cell lung cancer, squamous cell carcinoma.  He presented with a large left upper lobe lung mass in addition to left hilar mediastinal lymphadenopathy, scattered bilateral pulmonary nodules, and a small lesion in the left adrenal gland suspicious for metastatic disease.  The patient was diagnosed in May 2022.  The patient is currently undergoing palliative systemic chemotherapy with carboplatin for an AUC of 5, paclitaxel 175 mg per metered squared, Keytruda 200 mg IV every 3 weeks with Neulasta support.  He is status post 1 cycle and tolerated it well.   The patient experienced more adverse effects following the Neulasta injection including muscle aches and some pain. This was discussed as these are common adverse effects and he should continue to take Tylenol as well as Claritin or his generic loratadine daily to hopefully help relieve some pain.   Regarding the patient's ongoing conjunctivitis he was advised to take his current antibiotic drops and to follow up with his ophthalmologist or PCP if the issue does not resolve as this is likely not related to treatment.   The patient was seen with Dr. Julien Nordmann today.  Labs reviewed.  Recommend that he continue on the same treatment at the same dose.  We will see him back for follow-up visit in 2 weeks for evaluation before starting cycle #2.  He is  not ill appear and is not toxic appearing today. He can continue to use his oxygen as needed for shortness of breath and OTC cough medication for cough.   The patient was advised to call immediately if he has any concerning symptoms in the interval. The patient voices understanding of current disease status and treatment options and is in agreement with the current care plan. All questions were answered. The patient knows to call the clinic with any problems, questions or concerns. We can certainly see the patient much sooner if necessary       No orders of the defined types were placed in this encounter.     Khalaya Mcgurn L Salihah Peckham, PA-C 12/25/20  ADDENDUM: Hematology/Oncology Attending: I had a face-to-face encounter with the patient today.  I reviewed his record and recommended his care plan.  This is a very pleasant 85 years old white male recently diagnosed with a stage IV non-small cell lung cancer, squamous cell carcinoma.  The patient is currently undergoing systemic chemotherapy with carboplatin for AUC of 5, paclitaxel 175 Mg/M2 and Keytruda 200 Mg IV every 3 weeks status post 1 cycle started last week.  The patient tolerated the first week of his treatment well except for fatigue and arthralgia after the Neulasta injection.  He is currently taking Claritin as well as Tylenol for pain management.  He has mild conjunctivitis and he has previous prescription from his primary care physician and he will resume it. The patient has question about the role of the palliative chemotherapy and his prognosis and I discussed it with the patient in details and he understands that he has incurable condition and all the treatment are of palliative nature. I will see him back for follow-up visit in 2 weeks for evaluation before starting cycle #2. The patient was advised to call immediately if he has any concerning symptoms in the interval.  Disclaimer: This note was dictated with voice  recognition software. Similar sounding words can inadvertently be transcribed and may be missed upon review. Eilleen Kempf, MD 12/30/20

## 2020-12-30 ENCOUNTER — Ambulatory Visit: Payer: Medicare Other

## 2020-12-30 ENCOUNTER — Other Ambulatory Visit: Payer: Medicare Other

## 2020-12-30 ENCOUNTER — Inpatient Hospital Stay: Payer: Medicare Other | Attending: Physician Assistant | Admitting: Physician Assistant

## 2020-12-30 ENCOUNTER — Other Ambulatory Visit: Payer: Self-pay

## 2020-12-30 ENCOUNTER — Inpatient Hospital Stay: Payer: Medicare Other

## 2020-12-30 VITALS — BP 157/63 | HR 87 | Temp 97.9°F | Resp 20 | Ht 73.0 in | Wt 235.3 lb

## 2020-12-30 DIAGNOSIS — R059 Cough, unspecified: Secondary | ICD-10-CM | POA: Diagnosis not present

## 2020-12-30 DIAGNOSIS — Z5112 Encounter for antineoplastic immunotherapy: Secondary | ICD-10-CM | POA: Diagnosis not present

## 2020-12-30 DIAGNOSIS — Z5189 Encounter for other specified aftercare: Secondary | ICD-10-CM | POA: Diagnosis not present

## 2020-12-30 DIAGNOSIS — Z79899 Other long term (current) drug therapy: Secondary | ICD-10-CM | POA: Insufficient documentation

## 2020-12-30 DIAGNOSIS — R0602 Shortness of breath: Secondary | ICD-10-CM | POA: Diagnosis not present

## 2020-12-30 DIAGNOSIS — Z5111 Encounter for antineoplastic chemotherapy: Secondary | ICD-10-CM | POA: Insufficient documentation

## 2020-12-30 DIAGNOSIS — C3492 Malignant neoplasm of unspecified part of left bronchus or lung: Secondary | ICD-10-CM

## 2020-12-30 DIAGNOSIS — H109 Unspecified conjunctivitis: Secondary | ICD-10-CM | POA: Insufficient documentation

## 2020-12-30 DIAGNOSIS — C3412 Malignant neoplasm of upper lobe, left bronchus or lung: Secondary | ICD-10-CM | POA: Insufficient documentation

## 2020-12-30 LAB — CBC WITH DIFFERENTIAL (CANCER CENTER ONLY)
Abs Immature Granulocytes: 1.33 10*3/uL — ABNORMAL HIGH (ref 0.00–0.07)
Basophils Absolute: 0.1 10*3/uL (ref 0.0–0.1)
Basophils Relative: 0 %
Eosinophils Absolute: 0.4 10*3/uL (ref 0.0–0.5)
Eosinophils Relative: 2 %
HCT: 33.7 % — ABNORMAL LOW (ref 39.0–52.0)
Hemoglobin: 11.2 g/dL — ABNORMAL LOW (ref 13.0–17.0)
Immature Granulocytes: 9 %
Lymphocytes Relative: 10 %
Lymphs Abs: 1.5 10*3/uL (ref 0.7–4.0)
MCH: 30.9 pg (ref 26.0–34.0)
MCHC: 33.2 g/dL (ref 30.0–36.0)
MCV: 93.1 fL (ref 80.0–100.0)
Monocytes Absolute: 1.5 10*3/uL — ABNORMAL HIGH (ref 0.1–1.0)
Monocytes Relative: 10 %
Neutro Abs: 10.3 10*3/uL — ABNORMAL HIGH (ref 1.7–7.7)
Neutrophils Relative %: 69 %
Platelet Count: 183 10*3/uL (ref 150–400)
RBC: 3.62 MIL/uL — ABNORMAL LOW (ref 4.22–5.81)
RDW: 13.3 % (ref 11.5–15.5)
WBC Count: 14.9 10*3/uL — ABNORMAL HIGH (ref 4.0–10.5)
nRBC: 0 % (ref 0.0–0.2)

## 2020-12-30 LAB — CMP (CANCER CENTER ONLY)
ALT: 12 U/L (ref 0–44)
AST: 13 U/L — ABNORMAL LOW (ref 15–41)
Albumin: 3.2 g/dL — ABNORMAL LOW (ref 3.5–5.0)
Alkaline Phosphatase: 112 U/L (ref 38–126)
Anion gap: 7 (ref 5–15)
BUN: 15 mg/dL (ref 8–23)
CO2: 24 mmol/L (ref 22–32)
Calcium: 9 mg/dL (ref 8.9–10.3)
Chloride: 107 mmol/L (ref 98–111)
Creatinine: 0.89 mg/dL (ref 0.61–1.24)
GFR, Estimated: >60 mL/min (ref >60)
Glucose, Bld: 130 mg/dL — ABNORMAL HIGH (ref 70–99)
Potassium: 4.5 mmol/L (ref 3.5–5.1)
Sodium: 138 mmol/L (ref 135–145)
Total Bilirubin: 0.3 mg/dL (ref 0.3–1.2)
Total Protein: 6.6 g/dL (ref 6.5–8.1)

## 2021-01-05 ENCOUNTER — Inpatient Hospital Stay: Payer: Medicare Other

## 2021-01-05 ENCOUNTER — Ambulatory Visit: Payer: Medicare Other | Admitting: Internal Medicine

## 2021-01-05 ENCOUNTER — Other Ambulatory Visit: Payer: Medicare Other

## 2021-01-05 ENCOUNTER — Other Ambulatory Visit: Payer: Self-pay

## 2021-01-05 ENCOUNTER — Ambulatory Visit: Payer: Medicare Other

## 2021-01-05 DIAGNOSIS — R0602 Shortness of breath: Secondary | ICD-10-CM | POA: Diagnosis not present

## 2021-01-05 DIAGNOSIS — C3492 Malignant neoplasm of unspecified part of left bronchus or lung: Secondary | ICD-10-CM

## 2021-01-05 DIAGNOSIS — H109 Unspecified conjunctivitis: Secondary | ICD-10-CM | POA: Diagnosis not present

## 2021-01-05 DIAGNOSIS — R059 Cough, unspecified: Secondary | ICD-10-CM | POA: Diagnosis not present

## 2021-01-05 DIAGNOSIS — Z79899 Other long term (current) drug therapy: Secondary | ICD-10-CM | POA: Diagnosis not present

## 2021-01-05 DIAGNOSIS — C3412 Malignant neoplasm of upper lobe, left bronchus or lung: Secondary | ICD-10-CM | POA: Diagnosis not present

## 2021-01-05 DIAGNOSIS — Z5111 Encounter for antineoplastic chemotherapy: Secondary | ICD-10-CM | POA: Diagnosis not present

## 2021-01-05 DIAGNOSIS — Z5189 Encounter for other specified aftercare: Secondary | ICD-10-CM | POA: Diagnosis not present

## 2021-01-05 DIAGNOSIS — Z5112 Encounter for antineoplastic immunotherapy: Secondary | ICD-10-CM | POA: Diagnosis not present

## 2021-01-05 LAB — CBC WITH DIFFERENTIAL (CANCER CENTER ONLY)
Abs Immature Granulocytes: 0.22 10*3/uL — ABNORMAL HIGH (ref 0.00–0.07)
Basophils Absolute: 0.1 10*3/uL (ref 0.0–0.1)
Basophils Relative: 1 %
Eosinophils Absolute: 0.2 10*3/uL (ref 0.0–0.5)
Eosinophils Relative: 2 %
HCT: 34 % — ABNORMAL LOW (ref 39.0–52.0)
Hemoglobin: 11.1 g/dL — ABNORMAL LOW (ref 13.0–17.0)
Immature Granulocytes: 2 %
Lymphocytes Relative: 16 %
Lymphs Abs: 1.6 10*3/uL (ref 0.7–4.0)
MCH: 30.2 pg (ref 26.0–34.0)
MCHC: 32.6 g/dL (ref 30.0–36.0)
MCV: 92.4 fL (ref 80.0–100.0)
Monocytes Absolute: 0.9 10*3/uL (ref 0.1–1.0)
Monocytes Relative: 9 %
Neutro Abs: 7.3 10*3/uL (ref 1.7–7.7)
Neutrophils Relative %: 70 %
Platelet Count: 139 10*3/uL — ABNORMAL LOW (ref 150–400)
RBC: 3.68 MIL/uL — ABNORMAL LOW (ref 4.22–5.81)
RDW: 13.6 % (ref 11.5–15.5)
WBC Count: 10.2 10*3/uL (ref 4.0–10.5)
nRBC: 0 % (ref 0.0–0.2)

## 2021-01-05 LAB — CMP (CANCER CENTER ONLY)
ALT: 8 U/L (ref 0–44)
AST: 12 U/L — ABNORMAL LOW (ref 15–41)
Albumin: 2.9 g/dL — ABNORMAL LOW (ref 3.5–5.0)
Alkaline Phosphatase: 89 U/L (ref 38–126)
Anion gap: 7 (ref 5–15)
BUN: 19 mg/dL (ref 8–23)
CO2: 29 mmol/L (ref 22–32)
Calcium: 9.1 mg/dL (ref 8.9–10.3)
Chloride: 106 mmol/L (ref 98–111)
Creatinine: 1.03 mg/dL (ref 0.61–1.24)
GFR, Estimated: >60 mL/min (ref >60)
Glucose, Bld: 152 mg/dL — ABNORMAL HIGH (ref 70–99)
Potassium: 4.7 mmol/L (ref 3.5–5.1)
Sodium: 142 mmol/L (ref 135–145)
Total Bilirubin: 0.4 mg/dL (ref 0.3–1.2)
Total Protein: 6.7 g/dL (ref 6.5–8.1)

## 2021-01-08 ENCOUNTER — Other Ambulatory Visit: Payer: Self-pay | Admitting: Family Medicine

## 2021-01-08 DIAGNOSIS — I10 Essential (primary) hypertension: Secondary | ICD-10-CM

## 2021-01-12 ENCOUNTER — Other Ambulatory Visit: Payer: Medicare Other

## 2021-01-12 ENCOUNTER — Other Ambulatory Visit: Payer: Self-pay

## 2021-01-12 ENCOUNTER — Encounter: Payer: Self-pay | Admitting: Internal Medicine

## 2021-01-12 ENCOUNTER — Ambulatory Visit: Payer: Medicare Other

## 2021-01-12 ENCOUNTER — Inpatient Hospital Stay (HOSPITAL_BASED_OUTPATIENT_CLINIC_OR_DEPARTMENT_OTHER): Payer: Medicare Other | Admitting: Internal Medicine

## 2021-01-12 ENCOUNTER — Inpatient Hospital Stay: Payer: Medicare Other

## 2021-01-12 VITALS — BP 132/57 | HR 84 | Resp 18

## 2021-01-12 VITALS — BP 163/68 | HR 88 | Temp 97.6°F | Resp 20 | Ht 73.0 in | Wt 234.3 lb

## 2021-01-12 DIAGNOSIS — R059 Cough, unspecified: Secondary | ICD-10-CM | POA: Diagnosis not present

## 2021-01-12 DIAGNOSIS — Z79899 Other long term (current) drug therapy: Secondary | ICD-10-CM | POA: Diagnosis not present

## 2021-01-12 DIAGNOSIS — C3492 Malignant neoplasm of unspecified part of left bronchus or lung: Secondary | ICD-10-CM | POA: Diagnosis not present

## 2021-01-12 DIAGNOSIS — Z5112 Encounter for antineoplastic immunotherapy: Secondary | ICD-10-CM | POA: Diagnosis not present

## 2021-01-12 DIAGNOSIS — R0602 Shortness of breath: Secondary | ICD-10-CM | POA: Diagnosis not present

## 2021-01-12 DIAGNOSIS — H109 Unspecified conjunctivitis: Secondary | ICD-10-CM | POA: Diagnosis not present

## 2021-01-12 DIAGNOSIS — Z5189 Encounter for other specified aftercare: Secondary | ICD-10-CM | POA: Diagnosis not present

## 2021-01-12 DIAGNOSIS — Z5111 Encounter for antineoplastic chemotherapy: Secondary | ICD-10-CM | POA: Diagnosis not present

## 2021-01-12 DIAGNOSIS — C3412 Malignant neoplasm of upper lobe, left bronchus or lung: Secondary | ICD-10-CM | POA: Diagnosis not present

## 2021-01-12 LAB — CBC WITH DIFFERENTIAL (CANCER CENTER ONLY)
Abs Immature Granulocytes: 0.07 10*3/uL (ref 0.00–0.07)
Basophils Absolute: 0.1 10*3/uL (ref 0.0–0.1)
Basophils Relative: 1 %
Eosinophils Absolute: 0 10*3/uL (ref 0.0–0.5)
Eosinophils Relative: 1 %
HCT: 33.6 % — ABNORMAL LOW (ref 39.0–52.0)
Hemoglobin: 11.1 g/dL — ABNORMAL LOW (ref 13.0–17.0)
Immature Granulocytes: 1 %
Lymphocytes Relative: 17 %
Lymphs Abs: 1.5 10*3/uL (ref 0.7–4.0)
MCH: 30.7 pg (ref 26.0–34.0)
MCHC: 33 g/dL (ref 30.0–36.0)
MCV: 93.1 fL (ref 80.0–100.0)
Monocytes Absolute: 0.8 10*3/uL (ref 0.1–1.0)
Monocytes Relative: 9 %
Neutro Abs: 6.2 10*3/uL (ref 1.7–7.7)
Neutrophils Relative %: 71 %
Platelet Count: 378 10*3/uL (ref 150–400)
RBC: 3.61 MIL/uL — ABNORMAL LOW (ref 4.22–5.81)
RDW: 14.3 % (ref 11.5–15.5)
WBC Count: 8.7 10*3/uL (ref 4.0–10.5)
nRBC: 0 % (ref 0.0–0.2)

## 2021-01-12 LAB — CMP (CANCER CENTER ONLY)
ALT: 8 U/L (ref 0–44)
AST: 13 U/L — ABNORMAL LOW (ref 15–41)
Albumin: 3.1 g/dL — ABNORMAL LOW (ref 3.5–5.0)
Alkaline Phosphatase: 86 U/L (ref 38–126)
Anion gap: 9 (ref 5–15)
BUN: 16 mg/dL (ref 8–23)
CO2: 26 mmol/L (ref 22–32)
Calcium: 9 mg/dL (ref 8.9–10.3)
Chloride: 105 mmol/L (ref 98–111)
Creatinine: 1 mg/dL (ref 0.61–1.24)
GFR, Estimated: >60 mL/min (ref >60)
Glucose, Bld: 180 mg/dL — ABNORMAL HIGH (ref 70–99)
Potassium: 4.3 mmol/L (ref 3.5–5.1)
Sodium: 140 mmol/L (ref 135–145)
Total Bilirubin: 0.4 mg/dL (ref 0.3–1.2)
Total Protein: 6.9 g/dL (ref 6.5–8.1)

## 2021-01-12 LAB — TSH: TSH: 4.615 u[IU]/mL — ABNORMAL HIGH (ref 0.320–4.118)

## 2021-01-12 MED ORDER — SODIUM CHLORIDE 0.9 % IV SOLN
Freq: Once | INTRAVENOUS | Status: DC | PRN
  Filled 2021-01-12: qty 250

## 2021-01-12 MED ORDER — FAMOTIDINE 20 MG IN NS 100 ML IVPB
INTRAVENOUS | Status: AC
  Filled 2021-01-12: qty 100

## 2021-01-12 MED ORDER — SODIUM CHLORIDE 0.9 % IV SOLN
175.0000 mg/m2 | Freq: Once | INTRAVENOUS | Status: AC
  Administered 2021-01-12: 408 mg via INTRAVENOUS
  Filled 2021-01-12: qty 68

## 2021-01-12 MED ORDER — FAMOTIDINE 20 MG IN NS 100 ML IVPB
20.0000 mg | Freq: Once | INTRAVENOUS | Status: AC
  Administered 2021-01-12: 20 mg via INTRAVENOUS

## 2021-01-12 MED ORDER — FAMOTIDINE 20 MG IN NS 100 ML IVPB
20.0000 mg | Freq: Once | INTRAVENOUS | Status: AC | PRN
  Administered 2021-01-12: 20 mg via INTRAVENOUS

## 2021-01-12 MED ORDER — DIPHENHYDRAMINE HCL 50 MG/ML IJ SOLN
50.0000 mg | Freq: Once | INTRAMUSCULAR | Status: AC
  Administered 2021-01-12: 50 mg via INTRAVENOUS

## 2021-01-12 MED ORDER — METHYLPREDNISOLONE SODIUM SUCC 125 MG IJ SOLR
125.0000 mg | Freq: Once | INTRAMUSCULAR | Status: AC | PRN
  Administered 2021-01-12: 125 mg via INTRAVENOUS

## 2021-01-12 MED ORDER — DIPHENHYDRAMINE HCL 50 MG/ML IJ SOLN
INTRAMUSCULAR | Status: AC
  Filled 2021-01-12: qty 1

## 2021-01-12 MED ORDER — PALONOSETRON HCL INJECTION 0.25 MG/5ML
0.2500 mg | Freq: Once | INTRAVENOUS | Status: AC
Start: 2021-01-12 — End: 2021-01-12
  Administered 2021-01-12: 0.25 mg via INTRAVENOUS

## 2021-01-12 MED ORDER — SODIUM CHLORIDE 0.9 % IV SOLN
Freq: Once | INTRAVENOUS | Status: AC
  Filled 2021-01-12: qty 250

## 2021-01-12 MED ORDER — FOSAPREPITANT DIMEGLUMINE INJECTION 150 MG
150.0000 mg | Freq: Once | INTRAVENOUS | Status: AC
  Administered 2021-01-12: 150 mg via INTRAVENOUS
  Filled 2021-01-12: qty 150

## 2021-01-12 MED ORDER — SODIUM CHLORIDE 0.9 % IV SOLN
10.0000 mg | Freq: Once | INTRAVENOUS | Status: AC
  Administered 2021-01-12: 10 mg via INTRAVENOUS
  Filled 2021-01-12: qty 10

## 2021-01-12 MED ORDER — CARBOPLATIN CHEMO INJECTION 600 MG/60ML
511.5000 mg | Freq: Once | INTRAVENOUS | Status: AC
  Administered 2021-01-12: 510 mg via INTRAVENOUS
  Filled 2021-01-12: qty 51

## 2021-01-12 MED ORDER — PALONOSETRON HCL INJECTION 0.25 MG/5ML
INTRAVENOUS | Status: AC
  Filled 2021-01-12: qty 5

## 2021-01-12 MED ORDER — SODIUM CHLORIDE 0.9 % IV SOLN
200.0000 mg | Freq: Once | INTRAVENOUS | Status: AC
  Administered 2021-01-12: 200 mg via INTRAVENOUS
  Filled 2021-01-12: qty 8

## 2021-01-12 NOTE — Progress Notes (Signed)
[place] Telephone:([phone]) [phone]   Fax:([phone]) [phone]  OFFICE PROGRESS NOTE  Alex Edwards, [place] [address]  DIAGNOSIS: Stage IV (T3, N2, M1c) non-small cell Edwards cancer, squamous cell carcinoma presented with large left upper lobe Edwards mass in addition to left hilar and mediastinal lymphadenopathy as well as bilateral pulmonary nodules and left adrenal gland lesion diagnosed in May 2022.  PRIOR THERAPY: None  CURRENT THERAPY: Palliative systemic chemotherapy with carboplatin for an AUC of 5, paclitaxel 175 mg/m2 and Keytruda 200 mg IV every 3 weeks.  First dose expected on 12/22/2020. Status post 1 cycle.     INTERVAL HISTORY: Alex Edwards 85 y.o. male returns to the clinic today for follow-up visit.  The patient is feeling fine today with no concerning complaints except for shortness of breath with exertion and mild fatigue.  He tolerated the first cycle of his treatment.  The patient denied having any current chest pain, cough or hemoptysis.  He denied having any fever or chills.  He has no nausea, vomiting, diarrhea or constipation.  He has no headache or visual changes.  He is here today for evaluation before starting cycle #2.  MEDICAL HISTORY: Past Medical History:  Diagnosis Date   BCE (basal cell epithelioma)    Bruises easily    on hands   Cough    smokers cough or perfurmes   Degeneration macular    right eye   Diabetes mellitus without complication (HCC)    Dyslipidemia    ED (erectile dysfunction)    Hx of adenomatous colonic polyps    Hyperlipidemia    Hypertension    borderline   Obesity    Psoriasis    Seasonal allergies    Smoker    former    ALLERGIES:  is allergic to vancomycin, amlodipine, and latex.  MEDICATIONS:  Current Outpatient Medications  Medication Sig Dispense Refill   Ascorbic Acid (VITAMIN C) 1000 MG tablet Take 1,000 mg by mouth daily.     fexofenadine (ALLEGRA) 180 MG tablet  Take 180 mg by mouth daily.     ketoconazole (NIZORAL) 2 % cream APPLY TO THE AFFECTED AREA ONCE A DAY. (Patient taking differently: 1 application daily as needed for irritation.) 60 g 0   losartan (COZAAR) 100 MG tablet Take 1 tablet (100 mg total) by mouth daily. 30 tablet 0   metoprolol tartrate (LOPRESSOR) 50 MG tablet TAKE ONE TABLET BY MOUTH TWICE DAILY. 180 tablet 0   Multiple Vitamin (MULTI VITAMIN DAILY PO) Take 1 tablet by mouth daily.     Multiple Vitamins-Minerals (PRESERVISION AREDS 2) CAPS Take 1 capsule by mouth in the morning and at bedtime.     [place] Patient is to test one time a day DX: E11.9 100 each 4   ONETOUCH VERIO test strip USE TO TEST BLOOD SUGAR ONCE DAILY. 50 strip 2   prochlorperazine (COMPAZINE) 10 MG tablet Take 1 tablet (10 mg total) by mouth every 6 (six) hours as needed for nausea or vomiting. 30 tablet 0   tamsulosin (FLOMAX) 0.4 MG CAPS capsule Take 1 capsule (0.4 mg total) by mouth daily after supper. 30 capsule 3   triamcinolone cream (KENALOG) 0.1 % APPLY TO AFFECTED AREA TWICE A DAY. (Patient taking differently: Apply 1 application topically 2 (two) times daily as needed (itching).) 454 g 0   No current facility-administered medications for this visit.    SURGICAL  HISTORY:  Past Surgical History:  Procedure Laterality Date   BRONCHIAL BRUSHINGS  11/18/2020   Procedure: BRONCHIAL BRUSHINGS;  Surgeon: Spero Geralds, MD;  Location: [facility];  Service: Pulmonary;;   BRONCHIAL NEEDLE ASPIRATION BIOPSY  11/18/2020   Procedure: BRONCHIAL NEEDLE ASPIRATION BIOPSIES;  Surgeon: Spero Geralds, MD;  Location: [facility];  Service: Pulmonary;;   BRONCHIAL WASHINGS  11/18/2020   Procedure: BRONCHIAL WASHINGS;  Surgeon: Spero Geralds, MD;  Location: [facility];  Service: Pulmonary;;   CATARACT EXTRACTION, BILATERAL Bilateral 2013   COLONOSCOPY  2005   [facility]   ENDOBRONCHIAL ULTRASOUND N/A 11/18/2020   Procedure: ENDOBRONCHIAL  ULTRASOUND;  Surgeon: Spero Geralds, MD;  Location: [facility];  Service: Pulmonary;  Laterality: N/A;   POLYPECTOMY     SKIN CANCER EXCISION Right 1993   under eye-MOHS, freeze multiple places freq   TONSILLECTOMY  as child   TOTAL HIP ARTHROPLASTY Left 01/04/2014   Procedure: LEFT TOTAL HIP ARTHROPLASTY ANTERIOR APPROACH;  Surgeon: Mcarthur Rossetti, MD;  Location: [facility];  Service: Orthopedics;  Laterality: Left;   TOTAL HIP ARTHROPLASTY Left 02/14/2014   Procedure: Irrigation and Debridement left hip;  Surgeon: Mcarthur Rossetti, MD;  Location: [facility];  Service: Orthopedics;  Laterality: Left;    REVIEW OF SYSTEMS:  A comprehensive review of systems was negative except for: Constitutional: positive for fatigue Respiratory: positive for dyspnea on exertion   PHYSICAL EXAMINATION: General appearance: alert, cooperative, fatigued, and no distress Head: Normocephalic, without obvious abnormality, atraumatic Neck: no adenopathy, no JVD, supple, symmetrical, trachea midline, and thyroid not enlarged, symmetric, no tenderness/mass/nodules Lymph nodes: Cervical, supraclavicular, and axillary nodes normal. Resp: diminished breath sounds LUL and dullness to percussion LUL Back: symmetric, no curvature. ROM normal. No CVA tenderness. Cardio: regular rate and rhythm, S1, S2 normal, no murmur, click, rub or gallop GI: soft, non-tender; bowel sounds normal; no masses,  no organomegaly Extremities: extremities normal, atraumatic, no cyanosis or edema  ECOG PERFORMANCE STATUS: 1 - Symptomatic but completely ambulatory  Blood pressure (!) 163/68, pulse 88, temperature 97.6 F (36.4 C), temperature source Tympanic, resp. rate 20, height 6\' 1"  (1.854 m), weight 234 lb 4.8 oz (106.3 kg), SpO2 97 %.  LABORATORY DATA: Lab Results  Component Value Date   WBC 8.7 01/12/2021   HGB 11.1 (L) 01/12/2021   HCT 33.6 (L) 01/12/2021   MCV 93.1 01/12/2021   PLT 378 01/12/2021      Chemistry       Component Value Date/Time   NA 142 01/05/2021 1011   NA 141 01/29/2020 1157   K 4.7 01/05/2021 1011   CL 106 01/05/2021 1011   CO2 29 01/05/2021 1011   BUN 19 01/05/2021 1011   BUN 12 01/29/2020 1157   CREATININE 1.03 01/05/2021 1011   CREATININE 1.11 07/26/2016 0921      Component Value Date/Time   CALCIUM 9.1 01/05/2021 1011   ALKPHOS 89 01/05/2021 1011   AST 12 (L) 01/05/2021 1011   ALT 8 01/05/2021 1011   BILITOT 0.4 01/05/2021 1011       RADIOGRAPHIC STUDIES: VAS [place] LOWER EXTREMITY VENOUS (DVT)  Result Date: 12/23/2020  Lower Venous DVT Study Patient Name:  JASHUN PUERTAS Alex Edwards  Date of Exam:   12/23/2020 Medical Rec #: [id number]        Accession #:    [id number] Date of Birth: [birth year]         Patient Gender: M Patient Age:   81Y Exam Location:  [facility] Procedure:      VAS [place] LOWER EXTREMITY VENOUS (DVT) Referring Phys: [id number] [name] [name] --------------------------------------------------------------------------------  Indications: Swelling.  Risk Factors: Cancer. Limitations: Poor ultrasound/tissue interface. Comparison Study: No prior studies. Performing Technologist: Oliver Hum RVT  Examination Guidelines: A complete evaluation includes B-mode imaging, spectral Doppler, color Doppler, and power Doppler as needed of all accessible portions of each vessel. Bilateral testing is considered an integral part of a complete examination. Limited examinations for reoccurring indications may be performed as noted. The reflux portion of the exam is performed with the patient in reverse Trendelenburg.  +-----+---------------+---------+-----------+----------+--------------+ RIGHTCompressibilityPhasicitySpontaneityPropertiesThrombus Aging +-----+---------------+---------+-----------+----------+--------------+ CFV  Full           Yes      Yes                                 +-----+---------------+---------+-----------+----------+--------------+    +---------+---------------+---------+-----------+----------+--------------+ LEFT     CompressibilityPhasicitySpontaneityPropertiesThrombus Aging +---------+---------------+---------+-----------+----------+--------------+ CFV      Full           Yes      Yes                                 +---------+---------------+---------+-----------+----------+--------------+ SFJ      Full                                                        +---------+---------------+---------+-----------+----------+--------------+ FV Prox  Full                                                        +---------+---------------+---------+-----------+----------+--------------+ FV Mid   Full                                                        +---------+---------------+---------+-----------+----------+--------------+ FV DistalFull                                                        +---------+---------------+---------+-----------+----------+--------------+ PFV      Full                                                        +---------+---------------+---------+-----------+----------+--------------+ POP      Full           Yes      Yes                                 +---------+---------------+---------+-----------+----------+--------------+ PTV      Full                                                        +---------+---------------+---------+-----------+----------+--------------+  PERO     Full                                                        +---------+---------------+---------+-----------+----------+--------------+    Summary: RIGHT: - No evidence of common femoral vein obstruction.  LEFT: - There is no evidence of deep vein thrombosis in the lower extremity.  - No cystic structure found in the popliteal fossa.  *See table(s) above for measurements and observations. Electronically signed by Deitra Mayo MD on 12/23/2020 at 12:18:49 PM.    Final      ASSESSMENT AND PLAN: This is a very pleasant 85 years old white male recently diagnosed with Stage IV (T3, N2, M1c) non-small cell Edwards cancer, squamous cell carcinoma presented with large left upper lobe Edwards mass in addition to left hilar and mediastinal lymphadenopathy as well as bilateral pulmonary nodules and left adrenal gland lesion diagnosed in May 2022. The patient is currently undergoing systemic chemotherapy with carboplatin for AUC of 5, paclitaxel 175 Mg/M2 and Keytruda 200 Mg IV every 3 weeks with Neulasta support status post 1 cycle. He tolerated the first cycle of his treatment well except for the aching pain and fatigue after the Neulasta injection. I recommended for the patient to proceed with cycle #2 today as planned. I will see him back for follow-up visit in 3 weeks for evaluation before starting cycle #3. The patient was advised to call immediately if he has any concerning symptoms in the interval. The patient voices understanding of current disease status and treatment options and is in agreement with the current care plan.  All questions were answered. The patient knows to call the clinic with any problems, questions or concerns. We can certainly see the patient much sooner if necessary. The total time spent in the appointment was 40 minutes.  Disclaimer: This note was dictated with voice recognition software. Similar sounding words can inadvertently be transcribed and may not be corrected upon review.

## 2021-01-12 NOTE — Progress Notes (Signed)
Around 1145 pt called out stated he felt light headed and could not breath.  Pt was flushed in his face.  The patient's 3hr Paclitaxel was infusing at the time.  This is C2D1 and the infusion had been infusing around 71mins at the time the patient called out for help. @1147  Paclitaxel was stopped and VS were taken.  VS - BP 182/75; HR 88; RR 22; and SATS 73% RA.   @1148  the pt was placed on 4L O2 via [place] and given 125mg  of Solu-medrol @1149  20mg  of Famotidine was given and the pt's SATS were 99% on 4L O2 via [place].  Pt's face was no longer flushed.  The pt c/o of sharp pain in his lft flank.  Pt ranked the pain at 8/10 on the numeric scale.  Dr. Earlie Server was notified of the patient's reaction but did not come to chairside for assessment.   @1151  NS Bolus was started @1155  VS - BP 142/60; HR 93; RR 20; SATS 99% on 4L O2 via [place] @1205  - SBAR given to Dr. Earlie Server via telephone regarding pt status, pain, and for further orders.  Verbal order given from Dr. Earlie Server, "to restart Paclitaxel at 1/2 for 1hr, if pt is stable during 1/2 infusion, increase rate to full rate for the remainder of the infusion.  Contact me if pt should react again." @1206  O2 stopped @1209  VS - BP 129/52; HR 93; RR 18; SATS 93% RA @1211  Paclitaxel restarted at 1/2 rate per Dr. Lew Dawes orders. @1234  VS- 99% RA; HR 85; RR17; BP 142/66 @1311  Paclitaxel rate increased to full rate without complications.  Pt tolerating the infusion well.

## 2021-01-12 NOTE — Progress Notes (Signed)
Pt tolerated the remainder for the Paclitaxel and Carboplatin infusions without incidents.

## 2021-01-12 NOTE — Patient Instructions (Signed)
[facility]  Discharge Instructions: Thank you for choosing [facility] to provide your oncology and hematology care.   If you have a lab appointment with the Cancer Center, please go directly to the Cancer Center and check in at the registration area.   Wear comfortable clothing and clothing appropriate for easy access to any Portacath or PICC line.   We strive to give you quality time with your provider. You may need to reschedule your appointment if you arrive late (15 or more minutes).  Arriving late affects you and other patients whose appointments are after yours.  Also, if you miss three or more appointments without notifying the office, you may be dismissed from the clinic at the provider's discretion.      For prescription refill requests, have your pharmacy contact our office and allow 72 hours for refills to be completed.    Today you received the following chemotherapy and/or immunotherapy agents Pembrolizumab, Paclitaxel, and Carboplatin      To help prevent nausea and vomiting after your treatment, we encourage you to take your nausea medication as directed.  BELOW ARE SYMPTOMS THAT SHOULD BE REPORTED IMMEDIATELY: *FEVER GREATER THAN 100.4 F (38 C) OR HIGHER *CHILLS OR SWEATING *NAUSEA AND VOMITING THAT IS NOT CONTROLLED WITH YOUR NAUSEA MEDICATION *UNUSUAL SHORTNESS OF BREATH *UNUSUAL BRUISING OR BLEEDING *URINARY PROBLEMS (pain or burning when urinating, or frequent urination) *BOWEL PROBLEMS (unusual diarrhea, constipation, pain near the anus) TENDERNESS IN MOUTH AND THROAT WITH OR WITHOUT PRESENCE OF ULCERS (sore throat, sores in mouth, or a toothache) UNUSUAL RASH, SWELLING OR PAIN  UNUSUAL VAGINAL DISCHARGE OR ITCHING   Items with * indicate a potential emergency and should be followed up as soon as possible or go to the Emergency Department if any problems should occur.  Please show the CHEMOTHERAPY ALERT CARD or  IMMUNOTHERAPY ALERT CARD at check-in to the Emergency Department and triage nurse.  Should you have questions after your visit or need to cancel or reschedule your appointment, please contact [facility]  Dept: [phone]  and follow the prompts.  Office hours are 8:00 a.m. to 4:30 p.m. Monday - Friday. Please note that voicemails left after 4:00 p.m. may not be returned until the following business day.  We are closed weekends and major holidays. You have access to a nurse at all times for urgent questions. Please call the main number to the clinic Dept: [phone] and follow the prompts.   For any non-urgent questions, you may also contact your provider using MyChart. We now offer e-Visits for anyone 18 and older to request care online for non-urgent symptoms. For details visit [email or web address].   Also download the MyChart app! Go to the app store, search "MyChart", open the app, select [facility], and log in with your MyChart username and password.  Due to Covid, a mask is required upon entering the hospital/clinic. If you do not have a mask, one will be given to you upon arrival. For doctor visits, patients may have 1 support person aged 18 or older with them. For treatment visits, patients cannot have anyone with them due to current Covid guidelines and our immunocompromised population.   

## 2021-01-13 ENCOUNTER — Telehealth: Payer: Self-pay | Admitting: Family Medicine

## 2021-01-13 MED ORDER — LOSARTAN POTASSIUM 100 MG PO TABS: 100.0000 mg | ORAL_TABLET | Freq: Every day | ORAL | 1 refills | Status: DC

## 2021-01-13 NOTE — Telephone Encounter (Signed)
Pt called and states that while in hospital losartan was increased to 100mg . He spoke to [facility] previously and was advised to take 2 of losartan 50 mg. He has now used up all of that strength and needs refill of Losartan 100mg . Please send to [facility]. Pt can be reached at [phone].

## 2021-01-14 ENCOUNTER — Other Ambulatory Visit: Payer: Self-pay

## 2021-01-14 ENCOUNTER — Inpatient Hospital Stay: Payer: Medicare Other

## 2021-01-14 VITALS — BP 145/72 | HR 84 | Temp 97.9°F | Resp 18

## 2021-01-14 DIAGNOSIS — Z5111 Encounter for antineoplastic chemotherapy: Secondary | ICD-10-CM | POA: Diagnosis not present

## 2021-01-14 DIAGNOSIS — Z79899 Other long term (current) drug therapy: Secondary | ICD-10-CM | POA: Diagnosis not present

## 2021-01-14 DIAGNOSIS — C3412 Malignant neoplasm of upper lobe, left bronchus or lung: Secondary | ICD-10-CM | POA: Diagnosis not present

## 2021-01-14 DIAGNOSIS — C3492 Malignant neoplasm of unspecified part of left bronchus or lung: Secondary | ICD-10-CM

## 2021-01-14 DIAGNOSIS — H109 Unspecified conjunctivitis: Secondary | ICD-10-CM | POA: Diagnosis not present

## 2021-01-14 DIAGNOSIS — Z5189 Encounter for other specified aftercare: Secondary | ICD-10-CM | POA: Diagnosis not present

## 2021-01-14 DIAGNOSIS — R0602 Shortness of breath: Secondary | ICD-10-CM | POA: Diagnosis not present

## 2021-01-14 DIAGNOSIS — Z5112 Encounter for antineoplastic immunotherapy: Secondary | ICD-10-CM | POA: Diagnosis not present

## 2021-01-14 DIAGNOSIS — R059 Cough, unspecified: Secondary | ICD-10-CM | POA: Diagnosis not present

## 2021-01-14 MED ORDER — PEGFILGRASTIM-BMEZ 6 MG/0.6ML ~~LOC~~ SOSY
6.0000 mg | PREFILLED_SYRINGE | Freq: Once | SUBCUTANEOUS | Status: AC
  Administered 2021-01-14: 6 mg via SUBCUTANEOUS

## 2021-01-14 MED ORDER — PEGFILGRASTIM-BMEZ 6 MG/0.6ML ~~LOC~~ SOSY
PREFILLED_SYRINGE | SUBCUTANEOUS | Status: AC
  Filled 2021-01-14: qty 0.6

## 2021-01-14 NOTE — Patient Instructions (Signed)

## 2021-01-19 ENCOUNTER — Ambulatory Visit: Payer: Medicare Other | Admitting: Physician Assistant

## 2021-01-19 ENCOUNTER — Other Ambulatory Visit: Payer: Self-pay

## 2021-01-19 ENCOUNTER — Ambulatory Visit: Payer: Medicare Other

## 2021-01-19 ENCOUNTER — Other Ambulatory Visit: Payer: Medicare Other

## 2021-01-19 ENCOUNTER — Inpatient Hospital Stay: Payer: Medicare Other

## 2021-01-19 DIAGNOSIS — R0602 Shortness of breath: Secondary | ICD-10-CM | POA: Diagnosis not present

## 2021-01-19 DIAGNOSIS — Z5189 Encounter for other specified aftercare: Secondary | ICD-10-CM | POA: Diagnosis not present

## 2021-01-19 DIAGNOSIS — Z79899 Other long term (current) drug therapy: Secondary | ICD-10-CM | POA: Diagnosis not present

## 2021-01-19 DIAGNOSIS — H109 Unspecified conjunctivitis: Secondary | ICD-10-CM | POA: Diagnosis not present

## 2021-01-19 DIAGNOSIS — C3412 Malignant neoplasm of upper lobe, left bronchus or lung: Secondary | ICD-10-CM | POA: Diagnosis not present

## 2021-01-19 DIAGNOSIS — C3492 Malignant neoplasm of unspecified part of left bronchus or lung: Secondary | ICD-10-CM

## 2021-01-19 DIAGNOSIS — R059 Cough, unspecified: Secondary | ICD-10-CM | POA: Diagnosis not present

## 2021-01-19 DIAGNOSIS — Z5111 Encounter for antineoplastic chemotherapy: Secondary | ICD-10-CM | POA: Diagnosis not present

## 2021-01-19 DIAGNOSIS — Z5112 Encounter for antineoplastic immunotherapy: Secondary | ICD-10-CM | POA: Diagnosis not present

## 2021-01-19 LAB — CMP (CANCER CENTER ONLY)
ALT: 15 U/L (ref 0–44)
AST: 14 U/L — ABNORMAL LOW (ref 15–41)
Albumin: 3.3 g/dL — ABNORMAL LOW (ref 3.5–5.0)
Alkaline Phosphatase: 131 U/L — ABNORMAL HIGH (ref 38–126)
Anion gap: 6 (ref 5–15)
BUN: 16 mg/dL (ref 8–23)
CO2: 29 mmol/L (ref 22–32)
Calcium: 8.7 mg/dL — ABNORMAL LOW (ref 8.9–10.3)
Chloride: 104 mmol/L (ref 98–111)
Creatinine: 1 mg/dL (ref 0.61–1.24)
GFR, Estimated: >60 mL/min (ref >60)
Glucose, Bld: 160 mg/dL — ABNORMAL HIGH (ref 70–99)
Potassium: 5.2 mmol/L — ABNORMAL HIGH (ref 3.5–5.1)
Sodium: 139 mmol/L (ref 135–145)
Total Bilirubin: 0.3 mg/dL (ref 0.3–1.2)
Total Protein: 6.4 g/dL — ABNORMAL LOW (ref 6.5–8.1)

## 2021-01-19 LAB — CBC WITH DIFFERENTIAL (CANCER CENTER ONLY)
Abs Immature Granulocytes: 0.21 10*3/uL — ABNORMAL HIGH (ref 0.00–0.07)
Basophils Absolute: 0.1 10*3/uL (ref 0.0–0.1)
Basophils Relative: 1 %
Eosinophils Absolute: 0.3 10*3/uL (ref 0.0–0.5)
Eosinophils Relative: 2 %
HCT: 31.7 % — ABNORMAL LOW (ref 39.0–52.0)
Hemoglobin: 10.2 g/dL — ABNORMAL LOW (ref 13.0–17.0)
Immature Granulocytes: 1 %
Lymphocytes Relative: 13 %
Lymphs Abs: 2 10*3/uL (ref 0.7–4.0)
MCH: 30.6 pg (ref 26.0–34.0)
MCHC: 32.2 g/dL (ref 30.0–36.0)
MCV: 95.2 fL (ref 80.0–100.0)
Monocytes Absolute: 1.5 10*3/uL — ABNORMAL HIGH (ref 0.1–1.0)
Monocytes Relative: 10 %
Neutro Abs: 11.6 10*3/uL — ABNORMAL HIGH (ref 1.7–7.7)
Neutrophils Relative %: 73 %
Platelet Count: 196 10*3/uL (ref 150–400)
RBC: 3.33 MIL/uL — ABNORMAL LOW (ref 4.22–5.81)
RDW: 14.5 % (ref 11.5–15.5)
WBC Count: 15.7 10*3/uL — ABNORMAL HIGH (ref 4.0–10.5)
nRBC: 0 % (ref 0.0–0.2)

## 2021-01-22 DIAGNOSIS — J9601 Acute respiratory failure with hypoxia: Secondary | ICD-10-CM | POA: Diagnosis not present

## 2021-01-26 ENCOUNTER — Other Ambulatory Visit: Payer: Medicare Other

## 2021-01-26 ENCOUNTER — Inpatient Hospital Stay: Payer: Medicare Other | Attending: Internal Medicine

## 2021-01-26 ENCOUNTER — Ambulatory Visit: Payer: Medicare Other

## 2021-01-26 ENCOUNTER — Other Ambulatory Visit: Payer: Self-pay

## 2021-01-26 DIAGNOSIS — N529 Male erectile dysfunction, unspecified: Secondary | ICD-10-CM | POA: Diagnosis not present

## 2021-01-26 DIAGNOSIS — E669 Obesity, unspecified: Secondary | ICD-10-CM | POA: Diagnosis not present

## 2021-01-26 DIAGNOSIS — C3492 Malignant neoplasm of unspecified part of left bronchus or lung: Secondary | ICD-10-CM

## 2021-01-26 DIAGNOSIS — E785 Hyperlipidemia, unspecified: Secondary | ICD-10-CM | POA: Insufficient documentation

## 2021-01-26 DIAGNOSIS — E119 Type 2 diabetes mellitus without complications: Secondary | ICD-10-CM | POA: Insufficient documentation

## 2021-01-26 DIAGNOSIS — Z5112 Encounter for antineoplastic immunotherapy: Secondary | ICD-10-CM | POA: Diagnosis not present

## 2021-01-26 DIAGNOSIS — Z5189 Encounter for other specified aftercare: Secondary | ICD-10-CM | POA: Insufficient documentation

## 2021-01-26 DIAGNOSIS — C7972 Secondary malignant neoplasm of left adrenal gland: Secondary | ICD-10-CM | POA: Insufficient documentation

## 2021-01-26 DIAGNOSIS — Z5111 Encounter for antineoplastic chemotherapy: Secondary | ICD-10-CM | POA: Insufficient documentation

## 2021-01-26 DIAGNOSIS — R5383 Other fatigue: Secondary | ICD-10-CM | POA: Diagnosis not present

## 2021-01-26 DIAGNOSIS — Z79899 Other long term (current) drug therapy: Secondary | ICD-10-CM | POA: Diagnosis not present

## 2021-01-26 DIAGNOSIS — C3412 Malignant neoplasm of upper lobe, left bronchus or lung: Secondary | ICD-10-CM | POA: Diagnosis not present

## 2021-01-26 DIAGNOSIS — Z87891 Personal history of nicotine dependence: Secondary | ICD-10-CM | POA: Insufficient documentation

## 2021-01-26 DIAGNOSIS — C778 Secondary and unspecified malignant neoplasm of lymph nodes of multiple regions: Secondary | ICD-10-CM | POA: Diagnosis not present

## 2021-01-26 DIAGNOSIS — R918 Other nonspecific abnormal finding of lung field: Secondary | ICD-10-CM | POA: Insufficient documentation

## 2021-01-26 LAB — CBC WITH DIFFERENTIAL (CANCER CENTER ONLY)
Abs Immature Granulocytes: 0.24 10*3/uL — ABNORMAL HIGH (ref 0.00–0.07)
Basophils Absolute: 0.1 10*3/uL (ref 0.0–0.1)
Basophils Relative: 1 %
Eosinophils Absolute: 0.3 10*3/uL (ref 0.0–0.5)
Eosinophils Relative: 3 %
HCT: 32.2 % — ABNORMAL LOW (ref 39.0–52.0)
Hemoglobin: 10.7 g/dL — ABNORMAL LOW (ref 13.0–17.0)
Immature Granulocytes: 2 %
Lymphocytes Relative: 14 %
Lymphs Abs: 1.8 10*3/uL (ref 0.7–4.0)
MCH: 31 pg (ref 26.0–34.0)
MCHC: 33.2 g/dL (ref 30.0–36.0)
MCV: 93.3 fL (ref 80.0–100.0)
Monocytes Absolute: 1.2 10*3/uL — ABNORMAL HIGH (ref 0.1–1.0)
Monocytes Relative: 9 %
Neutro Abs: 9.3 10*3/uL — ABNORMAL HIGH (ref 1.7–7.7)
Neutrophils Relative %: 71 %
Platelet Count: 108 10*3/uL — ABNORMAL LOW (ref 150–400)
RBC: 3.45 MIL/uL — ABNORMAL LOW (ref 4.22–5.81)
RDW: 15.6 % — ABNORMAL HIGH (ref 11.5–15.5)
WBC Count: 13 10*3/uL — ABNORMAL HIGH (ref 4.0–10.5)
nRBC: 0 % (ref 0.0–0.2)

## 2021-01-26 LAB — CMP (CANCER CENTER ONLY)
ALT: 15 U/L (ref 0–44)
AST: 15 U/L (ref 15–41)
Albumin: 3.4 g/dL — ABNORMAL LOW (ref 3.5–5.0)
Alkaline Phosphatase: 121 U/L (ref 38–126)
Anion gap: 7 (ref 5–15)
BUN: 14 mg/dL (ref 8–23)
CO2: 28 mmol/L (ref 22–32)
Calcium: 9.6 mg/dL (ref 8.9–10.3)
Chloride: 105 mmol/L (ref 98–111)
Creatinine: 1.11 mg/dL (ref 0.61–1.24)
GFR, Estimated: >60 mL/min (ref >60)
Glucose, Bld: 96 mg/dL (ref 70–99)
Potassium: 5.5 mmol/L — ABNORMAL HIGH (ref 3.5–5.1)
Sodium: 140 mmol/L (ref 135–145)
Total Bilirubin: 0.4 mg/dL (ref 0.3–1.2)
Total Protein: 6.9 g/dL (ref 6.5–8.1)

## 2021-01-29 ENCOUNTER — Encounter: Payer: Self-pay | Admitting: Family Medicine

## 2021-01-29 ENCOUNTER — Ambulatory Visit (INDEPENDENT_AMBULATORY_CARE_PROVIDER_SITE_OTHER): Payer: Medicare Other | Admitting: Family Medicine

## 2021-01-29 ENCOUNTER — Other Ambulatory Visit: Payer: Self-pay

## 2021-01-29 VITALS — BP 102/68 | HR 70 | Temp 97.7°F | Ht 72.75 in | Wt 235.6 lb

## 2021-01-29 DIAGNOSIS — D119 Benign neoplasm of major salivary gland, unspecified: Secondary | ICD-10-CM

## 2021-01-29 DIAGNOSIS — E119 Type 2 diabetes mellitus without complications: Secondary | ICD-10-CM

## 2021-01-29 DIAGNOSIS — I152 Hypertension secondary to endocrine disorders: Secondary | ICD-10-CM | POA: Diagnosis not present

## 2021-01-29 DIAGNOSIS — I6521 Occlusion and stenosis of right carotid artery: Secondary | ICD-10-CM

## 2021-01-29 DIAGNOSIS — E1169 Type 2 diabetes mellitus with other specified complication: Secondary | ICD-10-CM | POA: Diagnosis not present

## 2021-01-29 DIAGNOSIS — E785 Hyperlipidemia, unspecified: Secondary | ICD-10-CM

## 2021-01-29 DIAGNOSIS — Z87891 Personal history of nicotine dependence: Secondary | ICD-10-CM | POA: Diagnosis not present

## 2021-01-29 DIAGNOSIS — Z0000 Encounter for general adult medical examination without abnormal findings: Secondary | ICD-10-CM

## 2021-01-29 DIAGNOSIS — Z789 Other specified health status: Secondary | ICD-10-CM

## 2021-01-29 DIAGNOSIS — Z23 Encounter for immunization: Secondary | ICD-10-CM | POA: Diagnosis not present

## 2021-01-29 DIAGNOSIS — E1159 Type 2 diabetes mellitus with other circulatory complications: Secondary | ICD-10-CM | POA: Diagnosis not present

## 2021-01-29 DIAGNOSIS — E669 Obesity, unspecified: Secondary | ICD-10-CM

## 2021-01-29 DIAGNOSIS — C3492 Malignant neoplasm of unspecified part of left bronchus or lung: Secondary | ICD-10-CM

## 2021-01-29 DIAGNOSIS — I700 Atherosclerosis of aorta: Secondary | ICD-10-CM | POA: Diagnosis not present

## 2021-01-29 LAB — POCT GLYCOSYLATED HEMOGLOBIN (HGB A1C): Hemoglobin A1C: 6.4 % — AB (ref 4.0–5.6)

## 2021-01-29 NOTE — Progress Notes (Addendum)
Alex Edwards is a 84 y.o. male who presents for annual wellness visit ,CPE and follow-up on chronic medical conditions.  He has been undergoing chemotherapy for his recent diagnosis of lung cancer.  He is followed by Dr. Earlie Server for that.  He has had some difficulty with the injections causing bone pain and making it very uncomfortable.  He has used prednisone for some of the pain in the past.  He has quit smoking.  He does have a history of statin intolerance and is tried multiple statins as well as Zetia without good result.  He continues on Flomax to help with his urinary function and finds it to be mostly successful.  He did have questions concerning a different medication however I explained that it is more expensive.  Continues on losartan and metoprolol for his blood pressure.  He is presently on no medications for his diabetes.  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  At this time he is not interested in pursuing this any further.  Evaluation for the cancer did show evidence of aortic atherosclerosis.  He also has a history of internal carotid stenosis on the right but at this time no intervention is contemplated.  He continues to complain of swelling and what he calls neuropathy in his feet.  He does have a lesion present on his left hand that he realizes needs to be evaluated by dermatology but is holding off on that.  He does check his CBGs daily.   Immunizations and Health Maintenance Immunization History  Administered Date(s) Administered   Fluad Quad(high Dose 65+) 04/10/2019, 04/09/2020   Influenza Split 04/22/2009, 07/02/2010, 05/12/2011, 03/28/2012   Influenza, High Dose Seasonal PF 04/02/2013, 04/23/2014, 04/09/2015, 03/16/2016, 03/29/2017, 04/17/2018   PFIZER(Purple Top)SARS-COV-2 Vaccination 08/24/2019, 09/18/2019, 05/28/2020   Pneumococcal Conjugate-13 04/23/2014   Pneumococcal Polysaccharide-23  03/29/2005   Td 03/29/2005   Tdap 02/11/2012   Health Maintenance Due  Topic Date Due   OPHTHALMOLOGY EXAM  Never done   Zoster Vaccines- Shingrix (1 of 2) Never done   COLONOSCOPY (Pts 45-43yrs Insurance coverage will need to be confirmed)  11/24/2014   FOOT EXAM  01/24/2020   COVID-19 Vaccine (4 - Booster for Pfizer series) 08/26/2020   INFLUENZA VACCINE  01/26/2021    Last colonoscopy: 11/24/11 Last PSA: N/A Dentist: N/A Ophtho: Q year Exercise: walking and sit up seven days a week   Other doctors caring for patient include: Dr. Nicholaus Corolla,  Dr. Zadie Rhine eye, Dr. Rachael Darby,   Advanced Directives: Does Patient Have a Medical Advance Directive?: No Would patient like information on creating a medical advance directive?: Yes (ED - Information included in AVS)  Depression screen:  See questionnaire below.     Depression screen [facility] 2/9 01/29/2021 01/29/2020 01/24/2019 01/31/2018 07/26/2016  Decreased Interest 0 0 0 0 0  Down, Depressed, Hopeless 0 0 0 0 0  PHQ - 2 Score 0 0 0 0 0    Fall Screen: See Questionaire below.   Fall Risk  01/29/2021 01/29/2020 01/24/2019 01/31/2018 07/26/2016  Falls in the past year? 0 0 0 No No  Number falls in past yr: 0 - - - -  Injury with Fall? 0 - - - -  Risk for fall due to : No Fall Risks No Fall Risks - - -  Follow up Falls evaluation completed - - - -    ADL  screen:  See questionnaire below.  Functional Status Survey: Is the patient deaf or have difficulty hearing?: No Does the patient have difficulty seeing, even when wearing glasses/contacts?: Yes Does the patient have difficulty concentrating, remembering, or making decisions?: No Does the patient have difficulty walking or climbing stairs?: No Does the patient have difficulty dressing or bathing?: No Does the patient have difficulty doing errands alone such as visiting a doctor's office or shopping?: No   Review of Systems  Constitutional: -, -unexpected weight change, -anorexia,  -fatigue Allergy: -sneezing, -itching, -congestion Dermatology: denies changing moles, rash, lumps ENT: -runny nose, -ear pain, -sore throat,  Cardiology:  -chest pain, -palpitations, -orthopnea, Respiratory: -cough, -shortness of breath, -dyspnea on exertion, -wheezing,  Gastroenterology: -abdominal pain, -nausea, -vomiting, -diarrhea, -constipation, -dysphagia Hematology: -bleeding or bruising problems Musculoskeletal: -arthralgias, -myalgias, -joint swelling, -back pain, - Ophthalmology: -vision changes,  Urology: -dysuria, -difficulty urinating,  -urinary frequency, -urgency, incontinence Neurology: -, -numbness, , -memory loss, -falls, -dizziness    PHYSICAL EXAM:   General Appearance: Alert, cooperative, no distress, appears stated age Head: Normocephalic, without obvious abnormality, atraumatic Skin: Healing lesion approximately 2 cm in size is noted on the dorsal surface of his left hand.   Lymph nodes: Cervical, supraclavicular, and axillary nodes normal Neurologic: CNII-XII intact, normal strength, sensation and gait; reflexes 2+ and symmetric throughout   Psych: Normal mood, affect, hygiene and grooming A1C is 6.4. 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  He is intolerant of statins including Zetia so no further intervention needed there.  I did complement him on his  quitting smoking.  He is not interested in pursuing any further evaluation and treatment of the Warthin's tumor and hold off on further evaluation of the carotid stenosis.  He will continue on his present medication regimen.  Discussed getting for the next COVID-vaccine and the possibility of getting the next 1 in the fall when the bivalent  1 comes out.  He will definitely consider this.  Also encouraged him to check his feet regularly since he does complain of decreased sensation.   Immunization recommendations discussed.    Medicare Attestation I have personally reviewed: The patient's medical and social history Their use of alcohol, tobacco or illicit drugs Their current medications and supplements The patient's functional ability including ADLs,fall risks, home safety risks, cognitive, and hearing and visual impairment Diet and physical activities Evidence for depression or mood disorders  The patient's weight, height, and BMI have been recorded in the chart.  I have made referrals, counseling, and provided education to the patient based on review of the above and I have provided the patient with a written personalized care plan for preventive services.     Jill Alexanders, MD   01/29/2021

## 2021-01-29 NOTE — Progress Notes (Deleted)
  Subjective:    Patient ID: Trong Gosling, male    DOB: 1935/07/02, 85 y.o.   MRN: [id number]  JOHNSON ARIZOLA Sr is a 85 y.o. male who presents for follow-up of Type 2 diabetes mellitus.  Home blood sugar records: {diabetes glucometry results:16657} Current symptoms/problems include {Symptoms; diabetes:14075} and have been {Desc; course:15616}. Daily foot checks:   Any foot concerns: *** Exercise: {types:19826} Diet: The following portions of the patient's history were reviewed and updated as appropriate: allergies, current medications, past medical history, past social history and problem list.  ROS as in subjective above.     Objective:    Physical Exam Alert and in no distress otherwise not examined.  There were no vitals taken for this visit.  Lab Review Diabetic Labs Latest Ref Rng & Units 01/26/2021 01/19/2021 01/12/2021 01/05/2021 12/30/2020  HbA1c 4.8 - 5.6 % - - - - -  Microalbumin mg/L - - - - -  Micro/Creat Ratio - - - - - -  Chol 100 - 199 mg/dL - - - - -  HDL >39 mg/dL - - - - -  Calc LDL 0 - 99 mg/dL - - - - -  Triglycerides 0 - 149 mg/dL - - - - -  Creatinine 0.61 - 1.24 mg/dL 1.11 1.00 1.00 1.03 0.89   BP/Weight 01/14/2021 01/12/2021 01/12/2021 12/30/2020 4/98/2641  Systolic BP 583 094 076 808 811  Diastolic BP 72 57 68 63 66  Wt. (Lbs) - - 234.3 235.3 233  BMI - - 30.91 31.04 30.74   Foot/eye exam completion dates 01/24/2019 09/26/2017  Foot Form Completion Done Done    Quade  reports that he quit smoking about 6 years ago. His smoking use included cigarettes. He has a 50.00 pack-year smoking history. He has never used smokeless tobacco. He reports that he does not drink alcohol and does not use drugs.     Assessment & Plan:    No diagnosis found.  Rx changes: {none:33079} Education: Reviewed 'ABCs' of diabetes management (respective goals in parentheses):  A1C (<7), blood pressure (<130/80), and cholesterol (LDL <100). Compliance at present is estimated to be  {good/fair/poor:33178}. Efforts to improve compliance (if necessary) will be directed at {compliance:16716}. Follow up: {NUMBERS; 0-10:33138} {time:11}

## 2021-02-02 ENCOUNTER — Inpatient Hospital Stay: Payer: Medicare Other

## 2021-02-02 ENCOUNTER — Other Ambulatory Visit: Payer: Self-pay

## 2021-02-02 ENCOUNTER — Inpatient Hospital Stay (HOSPITAL_BASED_OUTPATIENT_CLINIC_OR_DEPARTMENT_OTHER): Payer: Medicare Other | Admitting: Internal Medicine

## 2021-02-02 ENCOUNTER — Encounter: Payer: Self-pay | Admitting: Internal Medicine

## 2021-02-02 VITALS — BP 158/69 | HR 89 | Temp 97.8°F | Resp 20 | Ht 72.75 in | Wt 237.1 lb

## 2021-02-02 VITALS — BP 143/78

## 2021-02-02 DIAGNOSIS — Z5112 Encounter for antineoplastic immunotherapy: Secondary | ICD-10-CM

## 2021-02-02 DIAGNOSIS — C3492 Malignant neoplasm of unspecified part of left bronchus or lung: Secondary | ICD-10-CM

## 2021-02-02 DIAGNOSIS — E785 Hyperlipidemia, unspecified: Secondary | ICD-10-CM | POA: Diagnosis not present

## 2021-02-02 DIAGNOSIS — R918 Other nonspecific abnormal finding of lung field: Secondary | ICD-10-CM | POA: Diagnosis not present

## 2021-02-02 DIAGNOSIS — E119 Type 2 diabetes mellitus without complications: Secondary | ICD-10-CM | POA: Diagnosis not present

## 2021-02-02 DIAGNOSIS — Z5111 Encounter for antineoplastic chemotherapy: Secondary | ICD-10-CM

## 2021-02-02 DIAGNOSIS — C3412 Malignant neoplasm of upper lobe, left bronchus or lung: Secondary | ICD-10-CM | POA: Diagnosis not present

## 2021-02-02 DIAGNOSIS — C7972 Secondary malignant neoplasm of left adrenal gland: Secondary | ICD-10-CM | POA: Diagnosis not present

## 2021-02-02 DIAGNOSIS — C778 Secondary and unspecified malignant neoplasm of lymph nodes of multiple regions: Secondary | ICD-10-CM | POA: Diagnosis not present

## 2021-02-02 DIAGNOSIS — Z87891 Personal history of nicotine dependence: Secondary | ICD-10-CM | POA: Diagnosis not present

## 2021-02-02 DIAGNOSIS — R5383 Other fatigue: Secondary | ICD-10-CM | POA: Diagnosis not present

## 2021-02-02 DIAGNOSIS — Z79899 Other long term (current) drug therapy: Secondary | ICD-10-CM | POA: Diagnosis not present

## 2021-02-02 DIAGNOSIS — Z5189 Encounter for other specified aftercare: Secondary | ICD-10-CM | POA: Diagnosis not present

## 2021-02-02 DIAGNOSIS — C3490 Malignant neoplasm of unspecified part of unspecified bronchus or lung: Secondary | ICD-10-CM

## 2021-02-02 LAB — CBC WITH DIFFERENTIAL (CANCER CENTER ONLY)
Abs Immature Granulocytes: 0.04 K/uL (ref 0.00–0.07)
Basophils Absolute: 0.1 K/uL (ref 0.0–0.1)
Basophils Relative: 1 %
Eosinophils Absolute: 0.2 K/uL (ref 0.0–0.5)
Eosinophils Relative: 3 %
HCT: 31.4 % — ABNORMAL LOW (ref 39.0–52.0)
Hemoglobin: 10.4 g/dL — ABNORMAL LOW (ref 13.0–17.0)
Immature Granulocytes: 1 %
Lymphocytes Relative: 23 %
Lymphs Abs: 1.4 K/uL (ref 0.7–4.0)
MCH: 30.9 pg (ref 26.0–34.0)
MCHC: 33.1 g/dL (ref 30.0–36.0)
MCV: 93.2 fL (ref 80.0–100.0)
Monocytes Absolute: 0.9 K/uL (ref 0.1–1.0)
Monocytes Relative: 16 %
Neutro Abs: 3.4 K/uL (ref 1.7–7.7)
Neutrophils Relative %: 56 %
Platelet Count: 175 K/uL (ref 150–400)
RBC: 3.37 MIL/uL — ABNORMAL LOW (ref 4.22–5.81)
RDW: 16.2 % — ABNORMAL HIGH (ref 11.5–15.5)
WBC Count: 6 K/uL (ref 4.0–10.5)
nRBC: 0 % (ref 0.0–0.2)

## 2021-02-02 LAB — CMP (CANCER CENTER ONLY)
ALT: 14 U/L (ref 0–44)
AST: 14 U/L — ABNORMAL LOW (ref 15–41)
Albumin: 3.4 g/dL — ABNORMAL LOW (ref 3.5–5.0)
Alkaline Phosphatase: 94 U/L (ref 38–126)
Anion gap: 9 (ref 5–15)
BUN: 18 mg/dL (ref 8–23)
CO2: 24 mmol/L (ref 22–32)
Calcium: 9.6 mg/dL (ref 8.9–10.3)
Chloride: 106 mmol/L (ref 98–111)
Creatinine: 0.92 mg/dL (ref 0.61–1.24)
GFR, Estimated: >60 mL/min (ref >60)
Glucose, Bld: 105 mg/dL — ABNORMAL HIGH (ref 70–99)
Potassium: 4.6 mmol/L (ref 3.5–5.1)
Sodium: 139 mmol/L (ref 135–145)
Total Bilirubin: 0.4 mg/dL (ref 0.3–1.2)
Total Protein: 6.6 g/dL (ref 6.5–8.1)

## 2021-02-02 LAB — TSH: TSH: 5.001 u[IU]/mL — ABNORMAL HIGH (ref 0.320–4.118)

## 2021-02-02 MED ORDER — SODIUM CHLORIDE 0.9 % IV SOLN
Freq: Once | INTRAVENOUS | Status: AC
  Filled 2021-02-02: qty 250

## 2021-02-02 MED ORDER — SODIUM CHLORIDE 0.9 % IV SOLN
511.5000 mg | Freq: Once | INTRAVENOUS | Status: AC
  Administered 2021-02-02: 510 mg via INTRAVENOUS
  Filled 2021-02-02: qty 51

## 2021-02-02 MED ORDER — SODIUM CHLORIDE 0.9% FLUSH
10.0000 mL | INTRAVENOUS | Status: DC | PRN
  Filled 2021-02-02: qty 10

## 2021-02-02 MED ORDER — HEPARIN SOD (PORK) LOCK FLUSH 100 UNIT/ML IV SOLN
500.0000 [IU] | Freq: Once | INTRAVENOUS | Status: DC | PRN
  Filled 2021-02-02: qty 5

## 2021-02-02 MED ORDER — SODIUM CHLORIDE 0.9 % IV SOLN
200.0000 mg | Freq: Once | INTRAVENOUS | Status: AC
Start: 2021-02-02 — End: 2021-02-02
  Administered 2021-02-02: 200 mg via INTRAVENOUS
  Filled 2021-02-02: qty 8

## 2021-02-02 MED ORDER — SODIUM CHLORIDE 0.9 % IV SOLN
10.0000 mg | Freq: Once | INTRAVENOUS | Status: AC
  Administered 2021-02-02: 10 mg via INTRAVENOUS
  Filled 2021-02-02: qty 10

## 2021-02-02 MED ORDER — FAMOTIDINE 20 MG IN NS 100 ML IVPB
20.0000 mg | Freq: Once | INTRAVENOUS | Status: AC
  Administered 2021-02-02: 20 mg via INTRAVENOUS

## 2021-02-02 MED ORDER — DIPHENHYDRAMINE HCL 50 MG/ML IJ SOLN
25.0000 mg | Freq: Once | INTRAMUSCULAR | Status: AC
  Administered 2021-02-02: 25 mg via INTRAVENOUS

## 2021-02-02 MED ORDER — FAMOTIDINE 20 MG IN NS 100 ML IVPB
INTRAVENOUS | Status: AC
  Filled 2021-02-02: qty 100

## 2021-02-02 MED ORDER — SODIUM CHLORIDE 0.9 % IV SOLN
150.0000 mg | Freq: Once | INTRAVENOUS | Status: AC
  Administered 2021-02-02: 150 mg via INTRAVENOUS
  Filled 2021-02-02: qty 150

## 2021-02-02 MED ORDER — SODIUM CHLORIDE 0.9 % IV SOLN
175.0000 mg/m2 | Freq: Once | INTRAVENOUS | Status: AC
  Administered 2021-02-02: 408 mg via INTRAVENOUS
  Filled 2021-02-02: qty 68

## 2021-02-02 MED ORDER — PALONOSETRON HCL INJECTION 0.25 MG/5ML
INTRAVENOUS | Status: AC
  Filled 2021-02-02: qty 5

## 2021-02-02 MED ORDER — PALONOSETRON HCL INJECTION 0.25 MG/5ML
0.2500 mg | Freq: Once | INTRAVENOUS | Status: AC
  Administered 2021-02-02: 0.25 mg via INTRAVENOUS

## 2021-02-02 MED ORDER — DIPHENHYDRAMINE HCL 50 MG/ML IJ SOLN
INTRAMUSCULAR | Status: AC
  Filled 2021-02-02: qty 1

## 2021-02-02 NOTE — Patient Instructions (Signed)
[facility]  Discharge Instructions: Thank you for choosing [facility] to provide your oncology and hematology care.   If you have a lab appointment with the [facility], please go directly to the [facility] and check in at the registration area.   Wear comfortable clothing and clothing appropriate for easy access to any Portacath or PICC line.   We strive to give you quality time with your provider. You may need to reschedule your appointment if you arrive late (15 or more minutes).  Arriving late affects you and other patients whose appointments are after yours.  Also, if you miss three or more appointments without notifying the office, you may be dismissed from the clinic at the provider's discretion.      For prescription refill requests, have your pharmacy contact our office and allow 72 hours for refills to be completed.    Today you received the following chemotherapy and/or immunotherapy agents: Keytruda, Taxol, Carboplatin   To help prevent nausea and vomiting after your treatment, we encourage you to take your nausea medication as directed.  BELOW ARE SYMPTOMS THAT SHOULD BE REPORTED IMMEDIATELY: *FEVER GREATER THAN 100.4 F (38 C) OR HIGHER *CHILLS OR SWEATING *NAUSEA AND VOMITING THAT IS NOT CONTROLLED WITH YOUR NAUSEA MEDICATION *UNUSUAL SHORTNESS OF BREATH *UNUSUAL BRUISING OR BLEEDING *URINARY PROBLEMS (pain or burning when urinating, or frequent urination) *BOWEL PROBLEMS (unusual diarrhea, constipation, pain near the anus) TENDERNESS IN MOUTH AND THROAT WITH OR WITHOUT PRESENCE OF ULCERS (sore throat, sores in mouth, or a toothache) UNUSUAL RASH, SWELLING OR PAIN  UNUSUAL VAGINAL DISCHARGE OR ITCHING   Items with * indicate a potential emergency and should be followed up as soon as possible or go to the Emergency Department if any problems should occur.  Please show the CHEMOTHERAPY ALERT CARD or IMMUNOTHERAPY ALERT CARD  at check-in to the Emergency Department and triage nurse.  Should you have questions after your visit or need to cancel or reschedule your appointment, please contact [facility]  Dept: [phone]  and follow the prompts.  Office hours are 8:00 a.m. to 4:30 p.m. Monday - Friday. Please note that voicemails left after 4:00 p.m. may not be returned until the following business day.  We are closed weekends and major holidays. You have access to a nurse at all times for urgent questions. Please call the main number to the clinic Dept: [phone] and follow the prompts.   For any non-urgent questions, you may also contact your provider using MyChart. We now offer e-Visits for anyone 83 and older to request care online for non-urgent symptoms. For details visit [email or web address].   Also download the MyChart app! Go to the app store, search "MyChart", open the app, select , and log in with your MyChart username and password.  Due to Covid, a mask is required upon entering the hospital/clinic. If you do not have a mask, one will be given to you upon arrival. For doctor visits, patients may have 1 support person aged 83 or older with them. For treatment visits, patients cannot have anyone with them due to current Covid guidelines and our immunocompromised population.

## 2021-02-02 NOTE — Progress Notes (Signed)
[facility] Telephone:([phone]   Fax:([phone]  OFFICE PROGRESS NOTE  [facility], [address] [place] [address]  DIAGNOSIS: Stage IV (T3, N2, M1c) non-small cell Edwards cancer, squamous cell carcinoma presented with large left upper lobe Edwards mass in addition to left hilar and mediastinal lymphadenopathy as well as bilateral pulmonary nodules and left adrenal gland lesion diagnosed in May 2022.  PRIOR THERAPY: None  CURRENT THERAPY: Palliative systemic chemotherapy with carboplatin for an AUC of 5, paclitaxel 175 mg/m2 and Keytruda 200 mg IV every 3 weeks.  First dose expected on 12/22/2020. Status post 2 cycles.    INTERVAL HISTORY: Alex [facility] Sr 85 y.o. male returns to the clinic today for follow-up visit.  The patient is feeling fine today with no concerning complaints except for fatigue and pain on the left side of the chest.  He also has some sputum with dark blood-tinged streaks especially in the morning.  He was more drowsy and sleepy after the Benadryl dose with the last treatment.  He was given prednisone and felt better.  He denied having any current shortness of breath except with exertion with no cough or hemoptysis he has no fever or chills.  He has no nausea, vomiting, diarrhea or constipation.  He has no headache or visual changes.  The patient denied having any significant weight loss or night sweats.  He is here today for evaluation before starting cycle #3 of his treatment.  MEDICAL HISTORY: Past Medical History:  Diagnosis Date   BCE (basal cell epithelioma)    Bruises easily    on hands   Cough    smokers cough or perfurmes   Degeneration macular    right eye   Diabetes mellitus without complication (HCC)    Dyslipidemia    ED (erectile dysfunction)    Hx of adenomatous colonic polyps    Hyperlipidemia    Hypertension    borderline   Obesity    Psoriasis    Seasonal allergies    Smoker    former     ALLERGIES:  is allergic to vancomycin, amlodipine, and latex.  MEDICATIONS:  Current Outpatient Medications  Medication Sig Dispense Refill   Ascorbic Acid (VITAMIN C) 1000 MG tablet Take 1,000 mg by mouth daily.     fexofenadine (ALLEGRA) 180 MG tablet Take 180 mg by mouth daily. (Patient not taking: Reported on 01/29/2021)     ketoconazole (NIZORAL) 2 % cream APPLY TO THE AFFECTED AREA ONCE A DAY. 60 g 0   loratadine (CLARITIN) 10 MG tablet Take 10 mg by mouth daily.     losartan (COZAAR) 100 MG tablet Take 1 tablet (100 mg total) by mouth daily. 90 tablet 1   metoprolol tartrate (LOPRESSOR) 50 MG tablet TAKE ONE TABLET BY MOUTH TWICE DAILY. 180 tablet 0   Multiple Vitamin (MULTI VITAMIN DAILY PO) Take 1 tablet by mouth daily.     Multiple Vitamins-Minerals (PRESERVISION AREDS 2) CAPS Take 1 capsule by mouth in the morning and at bedtime.     [facility] Patient is to test one time a day DX: E11.9 100 each 4   ONETOUCH VERIO test strip USE TO TEST BLOOD SUGAR ONCE DAILY. 50 strip 2   prochlorperazine (COMPAZINE) 10 MG tablet Take 1 tablet (10 mg total) by mouth every 6 (six) hours as needed for nausea or vomiting. 30 tablet 0   tamsulosin (FLOMAX) 0.4 MG CAPS capsule  Take 1 capsule (0.4 mg total) by mouth daily after supper. 30 capsule 3   triamcinolone cream (KENALOG) 0.1 % APPLY TO AFFECTED AREA TWICE A DAY. (Patient taking differently: Apply 1 application topically 2 (two) times daily as needed (itching).) 454 g 0   No current facility-administered medications for this visit.    SURGICAL HISTORY:  Past Surgical History:  Procedure Laterality Date   BRONCHIAL BRUSHINGS  11/18/2020   Procedure: BRONCHIAL BRUSHINGS;  Surgeon: Spero Geralds, MD;  Location: [facility];  Service: Pulmonary;;   BRONCHIAL NEEDLE ASPIRATION BIOPSY  11/18/2020   Procedure: BRONCHIAL NEEDLE ASPIRATION BIOPSIES;  Surgeon: Spero Geralds, MD;  Location: [facility];  Service:  Pulmonary;;   BRONCHIAL WASHINGS  11/18/2020   Procedure: BRONCHIAL WASHINGS;  Surgeon: Spero Geralds, MD;  Location: [facility];  Service: Pulmonary;;   CATARACT EXTRACTION, BILATERAL Bilateral 2013   COLONOSCOPY  2005   [facility]   ENDOBRONCHIAL ULTRASOUND N/A 11/18/2020   Procedure: ENDOBRONCHIAL ULTRASOUND;  Surgeon: Spero Geralds, MD;  Location: [facility];  Service: Pulmonary;  Laterality: N/A;   POLYPECTOMY     SKIN CANCER EXCISION Right 1993   under eye-MOHS, freeze multiple places freq   TONSILLECTOMY  as child   TOTAL HIP ARTHROPLASTY Left 01/04/2014   Procedure: LEFT TOTAL HIP ARTHROPLASTY ANTERIOR APPROACH;  Surgeon: Mcarthur Rossetti, MD;  Location: [facility];  Service: Orthopedics;  Laterality: Left;   TOTAL HIP ARTHROPLASTY Left 02/14/2014   Procedure: Irrigation and Debridement left hip;  Surgeon: Mcarthur Rossetti, MD;  Location: [facility];  Service: Orthopedics;  Laterality: Left;    REVIEW OF SYSTEMS:  A comprehensive review of systems was negative except for: Constitutional: positive for fatigue Respiratory: positive for cough and dyspnea on exertion   PHYSICAL EXAMINATION: General appearance: alert, cooperative, fatigued, and no distress Head: Normocephalic, without obvious abnormality, atraumatic Neck: no adenopathy, no JVD, supple, symmetrical, trachea midline, and thyroid not enlarged, symmetric, no tenderness/mass/nodules Lymph nodes: Cervical, supraclavicular, and axillary nodes normal. Resp: diminished breath sounds LUL and dullness to percussion LUL Back: symmetric, no curvature. ROM normal. No CVA tenderness. Cardio: regular rate and rhythm, S1, S2 normal, no murmur, click, rub or gallop GI: soft, non-tender; bowel sounds normal; no masses,  no organomegaly Extremities: extremities normal, atraumatic, no cyanosis or edema  ECOG PERFORMANCE STATUS: 1 - Symptomatic but completely ambulatory  Blood pressure (!) 158/69, pulse 89, temperature 97.8 F  (36.6 C), temperature source Tympanic, resp. rate 20, height 6' 0.75" (1.848 m), weight 237 lb 1.6 oz (107.5 kg), SpO2 96 %.  LABORATORY DATA: Lab Results  Component Value Date   WBC 13.0 (H) 01/26/2021   HGB 10.7 (L) 01/26/2021   HCT 32.2 (L) 01/26/2021   MCV 93.3 01/26/2021   PLT 108 (L) 01/26/2021      Chemistry      Component Value Date/Time   NA 140 01/26/2021 1021   NA 141 01/29/2020 1157   K 5.5 (H) 01/26/2021 1021   CL 105 01/26/2021 1021   CO2 28 01/26/2021 1021   BUN 14 01/26/2021 1021   BUN 12 01/29/2020 1157   CREATININE 1.11 01/26/2021 1021   CREATININE 1.11 07/26/2016 0921      Component Value Date/Time   CALCIUM 9.6 01/26/2021 1021   ALKPHOS 121 01/26/2021 1021   AST 15 01/26/2021 1021   ALT 15 01/26/2021 1021   BILITOT 0.4 01/26/2021 1021       RADIOGRAPHIC STUDIES: No results found.  ASSESSMENT AND  PLAN: This is a very pleasant 85 years old white male recently diagnosed with Stage IV (T3, N2, M1c) non-small cell Edwards cancer, squamous cell carcinoma presented with large left upper lobe Edwards mass in addition to left hilar and mediastinal lymphadenopathy as well as bilateral pulmonary nodules and left adrenal gland lesion diagnosed in May 2022. The patient is currently undergoing systemic chemotherapy with carboplatin for AUC of 5, paclitaxel 175 Mg/M2 and Keytruda 200 Mg IV every 3 weeks with Neulasta support status post 2 cycles. The patient continues to tolerate this treatment well with no concerning adverse effect except for fatigue. I recommended for him to proceed with cycle #3 today as planned. I will see him back for follow-up visit in 3 weeks for evaluation with repeat CT scan of the chest, abdomen and pelvis for restaging of his disease. The patient was advised to call immediately if he has any other concerning symptoms in the interval. The patient voices understanding of current disease status and treatment options and is in agreement with the  current care plan.  All questions were answered. The patient knows to call the clinic with any problems, questions or concerns. We can certainly see the patient much sooner if necessary. The total time spent in the appointment was 40 minutes.  Disclaimer: This note was dictated with voice recognition software. Similar sounding words can inadvertently be transcribed and may not be corrected upon review.

## 2021-02-04 ENCOUNTER — Other Ambulatory Visit: Payer: Self-pay

## 2021-02-04 ENCOUNTER — Inpatient Hospital Stay: Payer: Medicare Other

## 2021-02-04 VITALS — BP 111/52 | HR 79 | Temp 97.8°F | Resp 20

## 2021-02-04 DIAGNOSIS — Z5112 Encounter for antineoplastic immunotherapy: Secondary | ICD-10-CM | POA: Diagnosis not present

## 2021-02-04 DIAGNOSIS — Z79899 Other long term (current) drug therapy: Secondary | ICD-10-CM | POA: Diagnosis not present

## 2021-02-04 DIAGNOSIS — C3412 Malignant neoplasm of upper lobe, left bronchus or lung: Secondary | ICD-10-CM | POA: Diagnosis not present

## 2021-02-04 DIAGNOSIS — R918 Other nonspecific abnormal finding of lung field: Secondary | ICD-10-CM | POA: Diagnosis not present

## 2021-02-04 DIAGNOSIS — R5383 Other fatigue: Secondary | ICD-10-CM | POA: Diagnosis not present

## 2021-02-04 DIAGNOSIS — Z5111 Encounter for antineoplastic chemotherapy: Secondary | ICD-10-CM | POA: Diagnosis not present

## 2021-02-04 DIAGNOSIS — Z5189 Encounter for other specified aftercare: Secondary | ICD-10-CM | POA: Diagnosis not present

## 2021-02-04 DIAGNOSIS — E119 Type 2 diabetes mellitus without complications: Secondary | ICD-10-CM | POA: Diagnosis not present

## 2021-02-04 DIAGNOSIS — E785 Hyperlipidemia, unspecified: Secondary | ICD-10-CM | POA: Diagnosis not present

## 2021-02-04 DIAGNOSIS — C3492 Malignant neoplasm of unspecified part of left bronchus or lung: Secondary | ICD-10-CM

## 2021-02-04 DIAGNOSIS — C778 Secondary and unspecified malignant neoplasm of lymph nodes of multiple regions: Secondary | ICD-10-CM | POA: Diagnosis not present

## 2021-02-04 DIAGNOSIS — C7972 Secondary malignant neoplasm of left adrenal gland: Secondary | ICD-10-CM | POA: Diagnosis not present

## 2021-02-04 DIAGNOSIS — Z87891 Personal history of nicotine dependence: Secondary | ICD-10-CM | POA: Diagnosis not present

## 2021-02-04 MED ORDER — PEGFILGRASTIM-BMEZ 6 MG/0.6ML ~~LOC~~ SOSY
6.0000 mg | PREFILLED_SYRINGE | Freq: Once | SUBCUTANEOUS | Status: AC
  Administered 2021-02-04: 6 mg via SUBCUTANEOUS

## 2021-02-04 MED ORDER — PEGFILGRASTIM-BMEZ 6 MG/0.6ML ~~LOC~~ SOSY
PREFILLED_SYRINGE | SUBCUTANEOUS | Status: AC
  Filled 2021-02-04: qty 0.6

## 2021-02-04 NOTE — Patient Instructions (Signed)

## 2021-02-06 ENCOUNTER — Ambulatory Visit: Payer: Medicare Other | Admitting: Family Medicine

## 2021-02-07 ENCOUNTER — Other Ambulatory Visit (INDEPENDENT_AMBULATORY_CARE_PROVIDER_SITE_OTHER): Payer: Medicare Other | Admitting: Family Medicine

## 2021-02-07 DIAGNOSIS — E119 Type 2 diabetes mellitus without complications: Secondary | ICD-10-CM | POA: Diagnosis not present

## 2021-02-07 DIAGNOSIS — U071 COVID-19: Secondary | ICD-10-CM | POA: Diagnosis not present

## 2021-02-07 DIAGNOSIS — C3492 Malignant neoplasm of unspecified part of left bronchus or lung: Secondary | ICD-10-CM | POA: Diagnosis not present

## 2021-02-07 MED ORDER — NIRMATRELVIR/RITONAVIR (PAXLOVID)TABLET: 3.0000 | ORAL_TABLET | Freq: Two times a day (BID) | ORAL | 0 refills | Status: DC

## 2021-02-07 MED ORDER — NIRMATRELVIR/RITONAVIR (PAXLOVID)TABLET: 3.0000 | ORAL_TABLET | Freq: Two times a day (BID) | ORAL | 0 refills | Status: AC

## 2021-02-07 NOTE — Progress Notes (Signed)
   Subjective:    Patient ID: Alex [facility], male    DOB: 04/22/1936, 85 y.o.   MRN: [id number]  HPI Documentation for virtual audio telecommunications through [facility] encounter: Unable to do video. The patient was located at home. 2 patient identifiers used.  The provider was located in the office. The patient did consent to this visit and is aware of possible charges through their insurance for this visit.  The other persons participating in this telemedicine service were none. Time spent on call was 10 minutes and in review of previous records >25 minutes total for counseling and coordination of care.  This virtual service is not related to other E/M service within previous 7 days.  He states that yesterday he developed symptoms of nasal and chest congestion and today more of a productive cough and temperature of 100.1.  He is actively being treated for stage IV lung cancer as well as diabetes.  He has had 3 vaccines.  Review of Systems     Objective:   Physical Exam Alert and in no distress otherwise not examined.  His breathing pattern did appear normal.       Assessment & Plan:  COVID-19 - Plan: nirmatrelvir/ritonavir EUA (PAXLOVID) TABS, DISCONTINUED: nirmatrelvir/ritonavir EUA (PAXLOVID) TABS  Stage IV squamous cell carcinoma of left lung ([facility])  Controlled type 2 diabetes mellitus without complication, without long-term current use of insulin ([facility]) He is at high risk for complications from COVID.  Instructed him to use Tylenol for fever aches and pains and cough meds as needed.  He will call if he runs into trouble.

## 2021-02-09 ENCOUNTER — Inpatient Hospital Stay: Payer: Medicare Other

## 2021-02-11 ENCOUNTER — Telehealth: Payer: Self-pay | Admitting: Family Medicine

## 2021-02-11 NOTE — Telephone Encounter (Signed)
PT called and said he and his wife are 100% better, they are not out of the woods yet.

## 2021-02-16 ENCOUNTER — Inpatient Hospital Stay: Payer: Medicare Other

## 2021-02-19 ENCOUNTER — Ambulatory Visit (HOSPITAL_COMMUNITY)
Admission: RE | Admit: 2021-02-19 | Discharge: 2021-02-19 | Disposition: A | Discharge status: Home / Self Care | Payer: Medicare Other | Source: Ambulatory Visit | Attending: Internal Medicine | Admitting: Internal Medicine

## 2021-02-19 ENCOUNTER — Inpatient Hospital Stay: Payer: Medicare Other

## 2021-02-19 ENCOUNTER — Other Ambulatory Visit: Payer: Self-pay

## 2021-02-19 DIAGNOSIS — I700 Atherosclerosis of aorta: Secondary | ICD-10-CM | POA: Diagnosis not present

## 2021-02-19 DIAGNOSIS — C3490 Malignant neoplasm of unspecified part of unspecified bronchus or lung: Secondary | ICD-10-CM | POA: Diagnosis not present

## 2021-02-19 DIAGNOSIS — C3492 Malignant neoplasm of unspecified part of left bronchus or lung: Secondary | ICD-10-CM

## 2021-02-19 DIAGNOSIS — E785 Hyperlipidemia, unspecified: Secondary | ICD-10-CM | POA: Diagnosis not present

## 2021-02-19 DIAGNOSIS — E119 Type 2 diabetes mellitus without complications: Secondary | ICD-10-CM | POA: Diagnosis not present

## 2021-02-19 DIAGNOSIS — N210 Calculus in bladder: Secondary | ICD-10-CM | POA: Diagnosis not present

## 2021-02-19 DIAGNOSIS — R918 Other nonspecific abnormal finding of lung field: Secondary | ICD-10-CM | POA: Diagnosis not present

## 2021-02-19 DIAGNOSIS — Z79899 Other long term (current) drug therapy: Secondary | ICD-10-CM | POA: Diagnosis not present

## 2021-02-19 DIAGNOSIS — C778 Secondary and unspecified malignant neoplasm of lymph nodes of multiple regions: Secondary | ICD-10-CM | POA: Diagnosis not present

## 2021-02-19 DIAGNOSIS — R911 Solitary pulmonary nodule: Secondary | ICD-10-CM | POA: Diagnosis not present

## 2021-02-19 DIAGNOSIS — Z87891 Personal history of nicotine dependence: Secondary | ICD-10-CM | POA: Diagnosis not present

## 2021-02-19 DIAGNOSIS — C7972 Secondary malignant neoplasm of left adrenal gland: Secondary | ICD-10-CM | POA: Diagnosis not present

## 2021-02-19 DIAGNOSIS — Z5112 Encounter for antineoplastic immunotherapy: Secondary | ICD-10-CM | POA: Diagnosis not present

## 2021-02-19 DIAGNOSIS — Z5189 Encounter for other specified aftercare: Secondary | ICD-10-CM | POA: Diagnosis not present

## 2021-02-19 DIAGNOSIS — Z5111 Encounter for antineoplastic chemotherapy: Secondary | ICD-10-CM | POA: Diagnosis not present

## 2021-02-19 DIAGNOSIS — C3412 Malignant neoplasm of upper lobe, left bronchus or lung: Secondary | ICD-10-CM | POA: Diagnosis not present

## 2021-02-19 DIAGNOSIS — K429 Umbilical hernia without obstruction or gangrene: Secondary | ICD-10-CM | POA: Diagnosis not present

## 2021-02-19 DIAGNOSIS — R5383 Other fatigue: Secondary | ICD-10-CM | POA: Diagnosis not present

## 2021-02-19 LAB — CBC WITH DIFFERENTIAL (CANCER CENTER ONLY)
Abs Immature Granulocytes: 0.03 10*3/uL (ref 0.00–0.07)
Basophils Absolute: 0 10*3/uL (ref 0.0–0.1)
Basophils Relative: 1 %
Eosinophils Absolute: 0.1 10*3/uL (ref 0.0–0.5)
Eosinophils Relative: 1 %
HCT: 32 % — ABNORMAL LOW (ref 39.0–52.0)
Hemoglobin: 10.4 g/dL — ABNORMAL LOW (ref 13.0–17.0)
Immature Granulocytes: 0 %
Lymphocytes Relative: 23 %
Lymphs Abs: 1.9 10*3/uL (ref 0.7–4.0)
MCH: 31.5 pg (ref 26.0–34.0)
MCHC: 32.5 g/dL (ref 30.0–36.0)
MCV: 97 fL (ref 80.0–100.0)
Monocytes Absolute: 0.9 10*3/uL (ref 0.1–1.0)
Monocytes Relative: 11 %
Neutro Abs: 5.3 10*3/uL (ref 1.7–7.7)
Neutrophils Relative %: 64 %
Platelet Count: 222 10*3/uL (ref 150–400)
RBC: 3.3 MIL/uL — ABNORMAL LOW (ref 4.22–5.81)
RDW: 16.4 % — ABNORMAL HIGH (ref 11.5–15.5)
WBC Count: 8.2 10*3/uL (ref 4.0–10.5)
nRBC: 0 % (ref 0.0–0.2)

## 2021-02-19 LAB — CMP (CANCER CENTER ONLY)
ALT: 12 U/L (ref 0–44)
AST: 16 U/L (ref 15–41)
Albumin: 3.6 g/dL (ref 3.5–5.0)
Alkaline Phosphatase: 103 U/L (ref 38–126)
Anion gap: 8 (ref 5–15)
BUN: 12 mg/dL (ref 8–23)
CO2: 28 mmol/L (ref 22–32)
Calcium: 9.5 mg/dL (ref 8.9–10.3)
Chloride: 102 mmol/L (ref 98–111)
Creatinine: 0.96 mg/dL (ref 0.61–1.24)
GFR, Estimated: >60 mL/min (ref >60)
Glucose, Bld: 87 mg/dL (ref 70–99)
Potassium: 5.1 mmol/L (ref 3.5–5.1)
Sodium: 138 mmol/L (ref 135–145)
Total Bilirubin: 0.3 mg/dL (ref 0.3–1.2)
Total Protein: 7.2 g/dL (ref 6.5–8.1)

## 2021-02-19 MED ORDER — IOHEXOL 350 MG/ML SOLN
100.0000 mL | Freq: Once | INTRAVENOUS | Status: AC | PRN
  Administered 2021-02-19: 80 mL via INTRAVENOUS

## 2021-02-22 DIAGNOSIS — J9601 Acute respiratory failure with hypoxia: Secondary | ICD-10-CM | POA: Diagnosis not present

## 2021-02-23 ENCOUNTER — Other Ambulatory Visit: Payer: Self-pay | Admitting: Internal Medicine

## 2021-02-23 ENCOUNTER — Encounter: Payer: Self-pay | Admitting: Internal Medicine

## 2021-02-23 ENCOUNTER — Inpatient Hospital Stay (HOSPITAL_BASED_OUTPATIENT_CLINIC_OR_DEPARTMENT_OTHER): Payer: Medicare Other | Admitting: Internal Medicine

## 2021-02-23 ENCOUNTER — Inpatient Hospital Stay: Payer: Medicare Other

## 2021-02-23 ENCOUNTER — Other Ambulatory Visit: Payer: Self-pay

## 2021-02-23 VITALS — BP 151/83 | HR 90 | Temp 97.0°F | Resp 20 | Ht 72.75 in | Wt 238.3 lb

## 2021-02-23 DIAGNOSIS — Z5112 Encounter for antineoplastic immunotherapy: Secondary | ICD-10-CM

## 2021-02-23 DIAGNOSIS — Z79899 Other long term (current) drug therapy: Secondary | ICD-10-CM | POA: Diagnosis not present

## 2021-02-23 DIAGNOSIS — E119 Type 2 diabetes mellitus without complications: Secondary | ICD-10-CM | POA: Diagnosis not present

## 2021-02-23 DIAGNOSIS — C778 Secondary and unspecified malignant neoplasm of lymph nodes of multiple regions: Secondary | ICD-10-CM | POA: Diagnosis not present

## 2021-02-23 DIAGNOSIS — Z5111 Encounter for antineoplastic chemotherapy: Secondary | ICD-10-CM

## 2021-02-23 DIAGNOSIS — C3492 Malignant neoplasm of unspecified part of left bronchus or lung: Secondary | ICD-10-CM

## 2021-02-23 DIAGNOSIS — R918 Other nonspecific abnormal finding of lung field: Secondary | ICD-10-CM | POA: Diagnosis not present

## 2021-02-23 DIAGNOSIS — C7972 Secondary malignant neoplasm of left adrenal gland: Secondary | ICD-10-CM | POA: Diagnosis not present

## 2021-02-23 DIAGNOSIS — C3412 Malignant neoplasm of upper lobe, left bronchus or lung: Secondary | ICD-10-CM | POA: Diagnosis not present

## 2021-02-23 DIAGNOSIS — R5383 Other fatigue: Secondary | ICD-10-CM | POA: Diagnosis not present

## 2021-02-23 DIAGNOSIS — Z5189 Encounter for other specified aftercare: Secondary | ICD-10-CM | POA: Diagnosis not present

## 2021-02-23 DIAGNOSIS — Z87891 Personal history of nicotine dependence: Secondary | ICD-10-CM | POA: Diagnosis not present

## 2021-02-23 DIAGNOSIS — E785 Hyperlipidemia, unspecified: Secondary | ICD-10-CM | POA: Diagnosis not present

## 2021-02-23 LAB — CMP (CANCER CENTER ONLY)
ALT: 9 U/L (ref 0–44)
AST: 13 U/L — ABNORMAL LOW (ref 15–41)
Albumin: 3.3 g/dL — ABNORMAL LOW (ref 3.5–5.0)
Alkaline Phosphatase: 82 U/L (ref 38–126)
Anion gap: 10 (ref 5–15)
BUN: 15 mg/dL (ref 8–23)
CO2: 24 mmol/L (ref 22–32)
Calcium: 8.7 mg/dL — ABNORMAL LOW (ref 8.9–10.3)
Chloride: 106 mmol/L (ref 98–111)
Creatinine: 0.88 mg/dL (ref 0.61–1.24)
GFR, Estimated: >60 mL/min (ref >60)
Glucose, Bld: 156 mg/dL — ABNORMAL HIGH (ref 70–99)
Potassium: 4.2 mmol/L (ref 3.5–5.1)
Sodium: 140 mmol/L (ref 135–145)
Total Bilirubin: 0.3 mg/dL (ref 0.3–1.2)
Total Protein: 6.6 g/dL (ref 6.5–8.1)

## 2021-02-23 LAB — CBC WITH DIFFERENTIAL (CANCER CENTER ONLY)
Abs Immature Granulocytes: 0.01 10*3/uL (ref 0.00–0.07)
Basophils Absolute: 0 10*3/uL (ref 0.0–0.1)
Basophils Relative: 1 %
Eosinophils Absolute: 0.1 10*3/uL (ref 0.0–0.5)
Eosinophils Relative: 1 %
HCT: 31.1 % — ABNORMAL LOW (ref 39.0–52.0)
Hemoglobin: 10.1 g/dL — ABNORMAL LOW (ref 13.0–17.0)
Immature Granulocytes: 0 %
Lymphocytes Relative: 22 %
Lymphs Abs: 1.2 10*3/uL (ref 0.7–4.0)
MCH: 31.7 pg (ref 26.0–34.0)
MCHC: 32.5 g/dL (ref 30.0–36.0)
MCV: 97.5 fL (ref 80.0–100.0)
Monocytes Absolute: 0.7 10*3/uL (ref 0.1–1.0)
Monocytes Relative: 13 %
Neutro Abs: 3.4 10*3/uL (ref 1.7–7.7)
Neutrophils Relative %: 63 %
Platelet Count: 212 10*3/uL (ref 150–400)
RBC: 3.19 MIL/uL — ABNORMAL LOW (ref 4.22–5.81)
RDW: 16.5 % — ABNORMAL HIGH (ref 11.5–15.5)
WBC Count: 5.3 10*3/uL (ref 4.0–10.5)
nRBC: 0 % (ref 0.0–0.2)

## 2021-02-23 LAB — TSH: TSH: 4.563 u[IU]/mL — ABNORMAL HIGH (ref 0.320–4.118)

## 2021-02-23 MED ORDER — DEXAMETHASONE SODIUM PHOSPHATE 100 MG/10ML IJ SOLN
10.0000 mg | Freq: Once | INTRAMUSCULAR | Status: AC
  Administered 2021-02-23: 10 mg via INTRAVENOUS
  Filled 2021-02-23: qty 10

## 2021-02-23 MED ORDER — SODIUM CHLORIDE 0.9 % IV SOLN: Freq: Once | INTRAVENOUS | Status: AC

## 2021-02-23 MED ORDER — PALONOSETRON HCL INJECTION 0.25 MG/5ML
0.2500 mg | Freq: Once | INTRAVENOUS | Status: AC
  Administered 2021-02-23: 0.25 mg via INTRAVENOUS
  Filled 2021-02-23: qty 5

## 2021-02-23 MED ORDER — FAMOTIDINE 20 MG IN NS 100 ML IVPB
20.0000 mg | Freq: Once | INTRAVENOUS | Status: AC
Start: 2021-02-23 — End: 2021-02-23
  Administered 2021-02-23: 20 mg via INTRAVENOUS
  Filled 2021-02-23: qty 100

## 2021-02-23 MED ORDER — DIPHENHYDRAMINE HCL 50 MG/ML IJ SOLN: 25.0000 mg | Freq: Once | INTRAMUSCULAR | Status: DC

## 2021-02-23 MED ORDER — SODIUM CHLORIDE 0.9 % IV SOLN
510.0000 mg | Freq: Once | INTRAVENOUS | Status: AC
  Administered 2021-02-23: 510 mg via INTRAVENOUS
  Filled 2021-02-23: qty 51

## 2021-02-23 MED ORDER — SODIUM CHLORIDE 0.9 % IV SOLN
200.0000 mg | Freq: Once | INTRAVENOUS | Status: AC
  Administered 2021-02-23: 200 mg via INTRAVENOUS
  Filled 2021-02-23: qty 8

## 2021-02-23 MED ORDER — SODIUM CHLORIDE 0.9 % IV SOLN
175.0000 mg/m2 | Freq: Once | INTRAVENOUS | Status: AC
  Administered 2021-02-23: 408 mg via INTRAVENOUS
  Filled 2021-02-23: qty 68

## 2021-02-23 MED ORDER — DIPHENHYDRAMINE HCL 25 MG PO CAPS
25.0000 mg | ORAL_CAPSULE | Freq: Once | ORAL | Status: AC
  Administered 2021-02-23: 25 mg via ORAL
  Filled 2021-02-23: qty 1

## 2021-02-23 MED ORDER — SODIUM CHLORIDE 0.9 % IV SOLN
150.0000 mg | Freq: Once | INTRAVENOUS | Status: AC
  Administered 2021-02-23: 150 mg via INTRAVENOUS
  Filled 2021-02-23: qty 150

## 2021-02-23 NOTE — Progress Notes (Signed)
[facility] Telephone:([phone]) [phone]   [phone]  OFFICE PROGRESS NOTE  Alex Edwards, [place] [address]  DIAGNOSIS: Stage IV (T3, N2, M1c) non-small cell Edwards cancer, squamous cell carcinoma presented with large left upper lobe Edwards mass in addition to left hilar and mediastinal lymphadenopathy as well as bilateral pulmonary nodules and left adrenal gland lesion diagnosed in May 2022.  PRIOR THERAPY: None  CURRENT THERAPY: Palliative systemic chemotherapy with carboplatin for an AUC of 5, paclitaxel 175 mg/m2 and Keytruda 200 mg IV every 3 weeks.  First dose expected on 12/22/2020. Status post 3 cycles.    INTERVAL HISTORY: Alex Edwards 85 y.o. male returns to the clinic today for follow-up visit accompanied by his wife.  The patient is feeling fine today with no concerning complaints except for worsening peripheral neuropathy more in the right than the left lower extremity.  He was diagnosed with COVID-19 on February 06, 2021 and the patient was treated with Paxlovid.  He denied having any current chest pain but has shortness of breath with exertion with no cough or hemoptysis.  He denied having any fever or chills.  He has no nausea, vomiting, diarrhea or constipation.  He has no headache or visual changes.  He denied having any significant weight loss or night sweats.  The patient had repeat CT scan of the chest, abdomen pelvis performed recently and he is here for evaluation and discussion of his scan results.   MEDICAL HISTORY: Past Medical History:  Diagnosis Date   BCE (basal cell epithelioma)    Bruises easily    on hands   Cough    smokers cough or perfurmes   Degeneration macular    right eye   Diabetes mellitus without complication (HCC)    Dyslipidemia    ED (erectile dysfunction)    Hx of adenomatous colonic polyps    Hyperlipidemia    Hypertension    borderline   Obesity    Psoriasis    Seasonal allergies     Smoker    former    ALLERGIES:  is allergic to vancomycin, amlodipine, and latex.  MEDICATIONS:  Current Outpatient Medications  Medication Sig Dispense Refill   Ascorbic Acid (VITAMIN C) 1000 MG tablet Take 1,000 mg by mouth daily.     fexofenadine (ALLEGRA) 180 MG tablet Take 180 mg by mouth daily. (Patient not taking: No sig reported)     loratadine (CLARITIN) 10 MG tablet Take 10 mg by mouth daily.     losartan (COZAAR) 100 MG tablet Take 1 tablet (100 mg total) by mouth daily. 90 tablet 1   metoprolol tartrate (LOPRESSOR) 50 MG tablet TAKE ONE TABLET BY MOUTH TWICE DAILY. 180 tablet 0   Multiple Vitamin (MULTI VITAMIN DAILY PO) Take 1 tablet by mouth daily.     Multiple Vitamins-Minerals (PRESERVISION AREDS 2) CAPS Take 1 capsule by mouth in the morning and at bedtime.     [facility] Patient is to test one time a day DX: E11.9 100 each 4   ONETOUCH VERIO test strip USE TO TEST BLOOD SUGAR ONCE DAILY. 50 strip 2   prochlorperazine (COMPAZINE) 10 MG tablet Take 1 tablet (10 mg total) by mouth every 6 (six) hours as needed for nausea or vomiting. (Patient not taking: Reported on 02/02/2021) 30 tablet 0   tamsulosin (FLOMAX) 0.4 MG CAPS capsule Take 1 capsule (0.4 mg total) by mouth daily  after supper. 30 capsule 3   triamcinolone cream (KENALOG) 0.1 % APPLY TO AFFECTED AREA TWICE A DAY. (Patient not taking: Reported on 02/02/2021) 454 g 0   No current facility-administered medications for this visit.    SURGICAL HISTORY:  Past Surgical History:  Procedure Laterality Date   BRONCHIAL BRUSHINGS  11/18/2020   Procedure: BRONCHIAL BRUSHINGS;  Surgeon: Spero Geralds, MD;  Location: [facility];  Service: Pulmonary;;   BRONCHIAL NEEDLE ASPIRATION BIOPSY  11/18/2020   Procedure: BRONCHIAL NEEDLE ASPIRATION BIOPSIES;  Surgeon: Spero Geralds, MD;  Location: [facility];  Service: Pulmonary;;   BRONCHIAL WASHINGS  11/18/2020   Procedure: BRONCHIAL WASHINGS;   Surgeon: Spero Geralds, MD;  Location: [facility];  Service: Pulmonary;;   CATARACT EXTRACTION, BILATERAL Bilateral 2013   COLONOSCOPY  2005   [facility]   ENDOBRONCHIAL ULTRASOUND N/A 11/18/2020   Procedure: ENDOBRONCHIAL ULTRASOUND;  Surgeon: Spero Geralds, MD;  Location: [facility];  Service: Pulmonary;  Laterality: N/A;   POLYPECTOMY     SKIN CANCER EXCISION Right 1993   under eye-MOHS, freeze multiple places freq   TONSILLECTOMY  as child   TOTAL HIP ARTHROPLASTY Left 01/04/2014   Procedure: LEFT TOTAL HIP ARTHROPLASTY ANTERIOR APPROACH;  Surgeon: Mcarthur Rossetti, MD;  Location: [facility];  Service: Orthopedics;  Laterality: Left;   TOTAL HIP ARTHROPLASTY Left 02/14/2014   Procedure: Irrigation and Debridement left hip;  Surgeon: Mcarthur Rossetti, MD;  Location: [facility];  Service: Orthopedics;  Laterality: Left;    REVIEW OF SYSTEMS:  Constitutional: positive for fatigue Eyes: negative Ears, nose, mouth, throat, and face: negative Respiratory: positive for dyspnea on exertion Cardiovascular: negative Gastrointestinal: negative Genitourinary:negative Integument/breast: negative Hematologic/lymphatic: negative Musculoskeletal:negative Neurological: negative Behavioral/Psych: negative Endocrine: negative Allergic/Immunologic: negative   PHYSICAL EXAMINATION: General appearance: alert, cooperative, fatigued, and no distress Head: Normocephalic, without obvious abnormality, atraumatic Neck: no adenopathy, no JVD, supple, symmetrical, trachea midline, and thyroid not enlarged, symmetric, no tenderness/mass/nodules Lymph nodes: Cervical, supraclavicular, and axillary nodes normal. Resp: clear to auscultation bilaterally Back: symmetric, no curvature. ROM normal. No CVA tenderness. Cardio: regular rate and rhythm, S1, S2 normal, no murmur, click, rub or gallop GI: soft, non-tender; bowel sounds normal; no masses,  no organomegaly Extremities: extremities normal,  atraumatic, no cyanosis or edema Neurologic: Alert and oriented X 3, normal strength and tone. Normal symmetric reflexes. Normal coordination and gait  ECOG PERFORMANCE STATUS: 1 - Symptomatic but completely ambulatory  Blood pressure (!) 151/83, pulse 90, temperature (!) 97 F (36.1 C), temperature source Tympanic, resp. rate 20, height 6' 0.75" (1.848 m), weight 238 lb 4.8 oz (108.1 kg), SpO2 97 %.  LABORATORY DATA: Lab Results  Component Value Date   WBC 5.3 02/23/2021   HGB 10.1 (L) 02/23/2021   HCT 31.1 (L) 02/23/2021   MCV 97.5 02/23/2021   PLT 212 02/23/2021      Chemistry      Component Value Date/Time   NA 138 02/19/2021 1520   NA 141 01/29/2020 1157   K 5.1 02/19/2021 1520   CL 102 02/19/2021 1520   CO2 28 02/19/2021 1520   BUN 12 02/19/2021 1520   BUN 12 01/29/2020 1157   CREATININE 0.96 02/19/2021 1520   CREATININE 1.11 07/26/2016 0921      Component Value Date/Time   CALCIUM 9.5 02/19/2021 1520   ALKPHOS 103 02/19/2021 1520   AST 16 02/19/2021 1520   ALT 12 02/19/2021 1520   BILITOT 0.3 02/19/2021 1520  RADIOGRAPHIC STUDIES: CT Chest W Contrast  Result Date: 02/20/2021 CLINICAL DATA:  Primary Cancer Type: Edwards Imaging Indication: Assess response to therapy Interval therapy since last imaging? Yes Initial Cancer Diagnosis Date: 11/18/2020; Established by: Biopsy-proven Detailed Pathology: Stage IV non-small cell Edwards cancer, squamous cell carcinoma. Primary Tumor location: Left upper lobe.  Left adrenal gland lesion. Surgeries: No thoracic.  Hip arthroplasty. Chemotherapy: Yes; Ongoing? Yes; Most recent administration: 02/02/2021 Immunotherapy?  Yes; Type: Keytruda; Ongoing? Yes Radiation therapy? 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 EXAM: CT CHEST, ABDOMEN, AND PELVIS WITH CONTRAST TECHNIQUE: Multidetector CT imaging of the chest, abdomen and pelvis was performed following the standard protocol during bolus  administration of intravenous contrast. CONTRAST:  59mL OMNIPAQUE IOHEXOL 350 MG/ML SOLN COMPARISON:  12/05/2020 PET-CT.  Most recent CT chest 11/17/2020. FINDINGS: CT CHEST FINDINGS Cardiovascular: Heart size appears normal. No pericardial aortic atherosclerosis and coronary artery calcifications. Mediastinum/Nodes: Normal appearance of the thyroid gland. The trachea appears patent and is midline. Normal appearance of the esophagus. Left paratracheal lymph node measures 1 cm, image 25/2. Formally 1.4 cm. Subcarinal lymph node measures 0.8 cm, image 30/2.  Formally 1.3 cm. Left hilar lymph node measures 1.1 cm, image 33/2.  Formally 1.2 cm. Lungs/Pleura: The left upper lobe Edwards mass measures 5.7 x 4.5 cm, image 59/6. Previously 8.1 x 5.8 cm. Increase central cavitation compatible with tumor necrosis. Anteromedial subpleural mass within the left upper lobe measures 1.3 x 0.7 cm, image 73/6. Previously 2.6 x 1.9 cm. Previous 6 mm nodule in the superior segment of left lower lobe now measures 4 mm, image 76/6. Previous 7 mm Edwards nodule within the inferior lingula has resolved in the interval. Several new Edwards nodules are identified, these include: -Within the superior segment of left lower lobe there is a new small nodule measuring 1.2 x 0.9 cm, image 73/6. -Within the posterior right upper lobe there is a new subpleural nodule which measures 1.3 x 1.2 cm, image 26/6. -Within the posterior Edwards base there is a new subpleural nodule measuring 1.2 x 0.6 cm, image 118/6. -Within the central aspect of the posteromedial right upper lobe there is a new conglomeration of nodules versus part solid nodule measuring 1.9 by 1.7 cm, image 55/6. Multiple scattered clustered areas of tree-in-bud nodularity are again seen throughout both lungs. These are favored to represent inflammatory or infectious process. Musculoskeletal: No chest wall mass or suspicious bone lesions identified. CT ABDOMEN PELVIS FINDINGS Hepatobiliary: No  focal liver abnormality is seen. No gallstones, gallbladder wall thickening, or biliary dilatation. Pancreas: Unremarkable. No pancreatic ductal dilatation or surrounding inflammatory changes. Spleen: Normal in size without focal abnormality. Adrenals/Urinary Tract: Normal right adrenal gland. Stable left adrenal nodule measuring 1.6 cm, image 65/2. Small bilateral kidney cysts. Small stones are again identified clustered within the posterior right bladder measuring up to 5 mm. Stomach/Bowel: Stomach is within normal limits. Appendix appears normal. No evidence of bowel wall thickening, distention, or inflammatory changes. Distal colonic diverticula identified without signs of acute inflammation. Vascular/Lymphatic: Aortic atherosclerosis. No enlarged lymph nodes within the abdomen or pelvis. Reproductive: Prostate gland is enlarged with mass effect upon the bladder base. Other: No ascites or focal fluid collections. Small fat containing umbilical hernia. Musculoskeletal: Left hip arthroplasty. No acute or suspicious bone lesions. IMPRESSION: 1. The dominant left upper lobe Edwards mass has decreased in size in the interval. There has also been interval decrease in size of mediastinal and left hilar lymph nodes. The subpleural nodule  within the anteromedial left upper lobe has also decreased in the interval. 2. Small scattered Edwards nodules are again seen throughout both lungs. Several of the previously noted nodules have decreased in size or resolved in the interval. Several new Edwards nodules however are noted within the right upper lobe, right Edwards base and superior segment of left lower lobe. Although the appearance of these nodules are nonspecific and may reflect inflammatory or infectious process metastatic disease can not be excluded. 3. Similar appearance of clustered areas of tree-in-bud nodularity throughout both lungs which are nonspecific but favored to represent inflammatory or infectious process. 4. Stable  left adrenal nodule. 5. Aortic atherosclerosis and coronary artery calcifications. Aortic Atherosclerosis (ICD10-I70.0). Electronically Signed   By: Kerby Moors M.D.   On: 02/20/2021 10:55   CT Abdomen Pelvis W Contrast  Result Date: 02/20/2021 CLINICAL DATA:  Primary Cancer Type: Edwards Imaging Indication: Assess response to therapy Interval therapy since last imaging? Yes Initial Cancer Diagnosis Date: 11/18/2020; Established by: Biopsy-proven Detailed Pathology: Stage IV non-small cell Edwards cancer, squamous cell carcinoma. Primary Tumor location: Left upper lobe.  Left adrenal gland lesion. Surgeries: No thoracic.  Hip arthroplasty. Chemotherapy: Yes; Ongoing? Yes; Most recent administration: 02/02/2021 Immunotherapy?  Yes; Type: Keytruda; Ongoing? Yes Radiation therapy? 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 EXAM: CT CHEST, ABDOMEN, AND PELVIS WITH CONTRAST TECHNIQUE: Multidetector CT imaging of the chest, abdomen and pelvis was performed following the standard protocol during bolus administration of intravenous contrast. CONTRAST:  60mL OMNIPAQUE IOHEXOL 350 MG/ML SOLN COMPARISON:  12/05/2020 PET-CT.  Most recent CT chest 11/17/2020. FINDINGS: CT CHEST FINDINGS Cardiovascular: Heart size appears normal. No pericardial aortic atherosclerosis and coronary artery calcifications. Mediastinum/Nodes: Normal appearance of the thyroid gland. The trachea appears patent and is midline. Normal appearance of the esophagus. Left paratracheal lymph node measures 1 cm, image 25/2. Formally 1.4 cm. Subcarinal lymph node measures 0.8 cm, image 30/2.  Formally 1.3 cm. Left hilar lymph node measures 1.1 cm, image 33/2.  Formally 1.2 cm. Lungs/Pleura: The left upper lobe Edwards mass measures 5.7 x 4.5 cm, image 59/6. Previously 8.1 x 5.8 cm. Increase central cavitation compatible with tumor necrosis. Anteromedial subpleural mass within the left upper lobe measures 1.3 x 0.7 cm, image  73/6. Previously 2.6 x 1.9 cm. Previous 6 mm nodule in the superior segment of left lower lobe now measures 4 mm, image 76/6. Previous 7 mm Edwards nodule within the inferior lingula has resolved in the interval. Several new Edwards nodules are identified, these include: -Within the superior segment of left lower lobe there is a new small nodule measuring 1.2 x 0.9 cm, image 73/6. -Within the posterior right upper lobe there is a new subpleural nodule which measures 1.3 x 1.2 cm, image 26/6. -Within the posterior Edwards base there is a new subpleural nodule measuring 1.2 x 0.6 cm, image 118/6. -Within the central aspect of the posteromedial right upper lobe there is a new conglomeration of nodules versus part solid nodule measuring 1.9 by 1.7 cm, image 55/6. Multiple scattered clustered areas of tree-in-bud nodularity are again seen throughout both lungs. These are favored to represent inflammatory or infectious process. Musculoskeletal: No chest wall mass or suspicious bone lesions identified. CT ABDOMEN PELVIS FINDINGS Hepatobiliary: No focal liver abnormality is seen. No gallstones, gallbladder wall thickening, or biliary dilatation. Pancreas: Unremarkable. No pancreatic ductal dilatation or surrounding inflammatory changes. Spleen: Normal in size without focal abnormality. Adrenals/Urinary Tract: Normal right adrenal gland. Stable left adrenal  nodule measuring 1.6 cm, image 65/2. Small bilateral kidney cysts. Small stones are again identified clustered within the posterior right bladder measuring up to 5 mm. Stomach/Bowel: Stomach is within normal limits. Appendix appears normal. No evidence of bowel wall thickening, distention, or inflammatory changes. Distal colonic diverticula identified without signs of acute inflammation. Vascular/Lymphatic: Aortic atherosclerosis. No enlarged lymph nodes within the abdomen or pelvis. Reproductive: Prostate gland is enlarged with mass effect upon the bladder base. Other: No ascites  or focal fluid collections. Small fat containing umbilical hernia. Musculoskeletal: Left hip arthroplasty. No acute or suspicious bone lesions. IMPRESSION: 1. The dominant left upper lobe Edwards mass has decreased in size in the interval. There has also been interval decrease in size of mediastinal and left hilar lymph nodes. The subpleural nodule within the anteromedial left upper lobe has also decreased in the interval. 2. Small scattered Edwards nodules are again seen throughout both lungs. Several of the previously noted nodules have decreased in size or resolved in the interval. Several new Edwards nodules however are noted within the right upper lobe, right Edwards base and superior segment of left lower lobe. Although the appearance of these nodules are nonspecific and may reflect inflammatory or infectious process metastatic disease can not be excluded. 3. Similar appearance of clustered areas of tree-in-bud nodularity throughout both lungs which are nonspecific but favored to represent inflammatory or infectious process. 4. Stable left adrenal nodule. 5. Aortic atherosclerosis and coronary artery calcifications. Aortic Atherosclerosis (ICD10-I70.0). Electronically Signed   By: Kerby Moors M.D.   On: 02/20/2021 10:55    ASSESSMENT AND PLAN: This is a very pleasant 85 years old white male recently diagnosed with Stage IV (T3, N2, M1c) non-small cell Edwards cancer, squamous cell carcinoma presented with large left upper lobe Edwards mass in addition to left hilar and mediastinal lymphadenopathy as well as bilateral pulmonary nodules and left adrenal gland lesion diagnosed in May 2022. The patient is currently undergoing systemic chemotherapy with carboplatin for AUC of 5, paclitaxel 175 Mg/M2 and Keytruda 200 Mg IV every 3 weeks with Neulasta support status post 3 cycles. The patient continues to tolerate this treatment well except for mild fatigue and also mild peripheral neuropathy. He had repeat CT scan of the  chest, abdomen pelvis performed recently.  I personally and independently reviewed the scans and discussed the results with the patient and his wife today. His scan showed significant improvement of his disease. I recommended for him to proceed with cycle #4 today as planned. I will see him back for follow-up visit in 3 weeks before starting cycle #5 of maintenance treatment with single agent Keytruda. The patient was advised to call immediately if he has any other concerning symptoms in the interval. All questions were answered. The patient knows to call the clinic with any problems, questions or concerns. We can certainly see the patient much sooner if necessary. The total time spent in the appointment was 30 minutes.  Disclaimer: This note was dictated with voice recognition software. Similar sounding words can inadvertently be transcribed and may not be corrected upon review.

## 2021-02-25 ENCOUNTER — Other Ambulatory Visit: Payer: Self-pay

## 2021-02-25 ENCOUNTER — Inpatient Hospital Stay: Payer: Medicare Other

## 2021-02-25 VITALS — BP 132/74 | HR 87 | Temp 98.3°F | Resp 20

## 2021-02-25 DIAGNOSIS — Z79899 Other long term (current) drug therapy: Secondary | ICD-10-CM | POA: Diagnosis not present

## 2021-02-25 DIAGNOSIS — R5383 Other fatigue: Secondary | ICD-10-CM | POA: Diagnosis not present

## 2021-02-25 DIAGNOSIS — C778 Secondary and unspecified malignant neoplasm of lymph nodes of multiple regions: Secondary | ICD-10-CM | POA: Diagnosis not present

## 2021-02-25 DIAGNOSIS — Z5112 Encounter for antineoplastic immunotherapy: Secondary | ICD-10-CM | POA: Diagnosis not present

## 2021-02-25 DIAGNOSIS — C3492 Malignant neoplasm of unspecified part of left bronchus or lung: Secondary | ICD-10-CM

## 2021-02-25 DIAGNOSIS — C7972 Secondary malignant neoplasm of left adrenal gland: Secondary | ICD-10-CM | POA: Diagnosis not present

## 2021-02-25 DIAGNOSIS — Z87891 Personal history of nicotine dependence: Secondary | ICD-10-CM | POA: Diagnosis not present

## 2021-02-25 DIAGNOSIS — R918 Other nonspecific abnormal finding of lung field: Secondary | ICD-10-CM | POA: Diagnosis not present

## 2021-02-25 DIAGNOSIS — Z5189 Encounter for other specified aftercare: Secondary | ICD-10-CM | POA: Diagnosis not present

## 2021-02-25 DIAGNOSIS — C3412 Malignant neoplasm of upper lobe, left bronchus or lung: Secondary | ICD-10-CM | POA: Diagnosis not present

## 2021-02-25 DIAGNOSIS — E785 Hyperlipidemia, unspecified: Secondary | ICD-10-CM | POA: Diagnosis not present

## 2021-02-25 DIAGNOSIS — E119 Type 2 diabetes mellitus without complications: Secondary | ICD-10-CM | POA: Diagnosis not present

## 2021-02-25 DIAGNOSIS — Z5111 Encounter for antineoplastic chemotherapy: Secondary | ICD-10-CM | POA: Diagnosis not present

## 2021-02-25 MED ORDER — PEGFILGRASTIM-BMEZ 6 MG/0.6ML ~~LOC~~ SOSY
6.0000 mg | PREFILLED_SYRINGE | Freq: Once | SUBCUTANEOUS | Status: AC
Start: 2021-02-25 — End: 2021-02-25
  Administered 2021-02-25: 6 mg via SUBCUTANEOUS
  Filled 2021-02-25: qty 0.6

## 2021-02-25 NOTE — Patient Instructions (Signed)

## 2021-03-03 ENCOUNTER — Inpatient Hospital Stay: Payer: Medicare Other | Attending: Internal Medicine

## 2021-03-03 ENCOUNTER — Other Ambulatory Visit: Payer: Self-pay

## 2021-03-03 DIAGNOSIS — N281 Cyst of kidney, acquired: Secondary | ICD-10-CM | POA: Insufficient documentation

## 2021-03-03 DIAGNOSIS — N400 Enlarged prostate without lower urinary tract symptoms: Secondary | ICD-10-CM | POA: Diagnosis not present

## 2021-03-03 DIAGNOSIS — Z5112 Encounter for antineoplastic immunotherapy: Secondary | ICD-10-CM | POA: Insufficient documentation

## 2021-03-03 DIAGNOSIS — E785 Hyperlipidemia, unspecified: Secondary | ICD-10-CM | POA: Insufficient documentation

## 2021-03-03 DIAGNOSIS — Z79899 Other long term (current) drug therapy: Secondary | ICD-10-CM | POA: Diagnosis not present

## 2021-03-03 DIAGNOSIS — C7972 Secondary malignant neoplasm of left adrenal gland: Secondary | ICD-10-CM | POA: Diagnosis not present

## 2021-03-03 DIAGNOSIS — Z85828 Personal history of other malignant neoplasm of skin: Secondary | ICD-10-CM | POA: Diagnosis not present

## 2021-03-03 DIAGNOSIS — I10 Essential (primary) hypertension: Secondary | ICD-10-CM | POA: Diagnosis not present

## 2021-03-03 DIAGNOSIS — Z86010 Personal history of colonic polyps: Secondary | ICD-10-CM | POA: Diagnosis not present

## 2021-03-03 DIAGNOSIS — Z87891 Personal history of nicotine dependence: Secondary | ICD-10-CM | POA: Diagnosis not present

## 2021-03-03 DIAGNOSIS — I700 Atherosclerosis of aorta: Secondary | ICD-10-CM | POA: Diagnosis not present

## 2021-03-03 DIAGNOSIS — M7989 Other specified soft tissue disorders: Secondary | ICD-10-CM | POA: Diagnosis not present

## 2021-03-03 DIAGNOSIS — G629 Polyneuropathy, unspecified: Secondary | ICD-10-CM | POA: Diagnosis not present

## 2021-03-03 DIAGNOSIS — K429 Umbilical hernia without obstruction or gangrene: Secondary | ICD-10-CM | POA: Diagnosis not present

## 2021-03-03 DIAGNOSIS — R918 Other nonspecific abnormal finding of lung field: Secondary | ICD-10-CM | POA: Diagnosis not present

## 2021-03-03 DIAGNOSIS — C3412 Malignant neoplasm of upper lobe, left bronchus or lung: Secondary | ICD-10-CM | POA: Diagnosis not present

## 2021-03-03 DIAGNOSIS — E119 Type 2 diabetes mellitus without complications: Secondary | ICD-10-CM | POA: Insufficient documentation

## 2021-03-03 DIAGNOSIS — C3492 Malignant neoplasm of unspecified part of left bronchus or lung: Secondary | ICD-10-CM

## 2021-03-03 LAB — CMP (CANCER CENTER ONLY)
ALT: 14 U/L (ref 0–44)
AST: 15 U/L (ref 15–41)
Albumin: 3.6 g/dL (ref 3.5–5.0)
Alkaline Phosphatase: 128 U/L — ABNORMAL HIGH (ref 38–126)
Anion gap: 8 (ref 5–15)
BUN: 20 mg/dL (ref 8–23)
CO2: 26 mmol/L (ref 22–32)
Calcium: 9.4 mg/dL (ref 8.9–10.3)
Chloride: 107 mmol/L (ref 98–111)
Creatinine: 0.96 mg/dL (ref 0.61–1.24)
GFR, Estimated: >60 mL/min (ref >60)
Glucose, Bld: 118 mg/dL — ABNORMAL HIGH (ref 70–99)
Potassium: 5 mmol/L (ref 3.5–5.1)
Sodium: 141 mmol/L (ref 135–145)
Total Bilirubin: 0.2 mg/dL — ABNORMAL LOW (ref 0.3–1.2)
Total Protein: 6.6 g/dL (ref 6.5–8.1)

## 2021-03-03 LAB — CBC WITH DIFFERENTIAL (CANCER CENTER ONLY)
Abs Immature Granulocytes: 0.23 10*3/uL — ABNORMAL HIGH (ref 0.00–0.07)
Basophils Absolute: 0.1 10*3/uL (ref 0.0–0.1)
Basophils Relative: 1 %
Eosinophils Absolute: 0.4 10*3/uL (ref 0.0–0.5)
Eosinophils Relative: 2 %
HCT: 29.7 % — ABNORMAL LOW (ref 39.0–52.0)
Hemoglobin: 10 g/dL — ABNORMAL LOW (ref 13.0–17.0)
Immature Granulocytes: 2 %
Lymphocytes Relative: 11 %
Lymphs Abs: 1.6 10*3/uL (ref 0.7–4.0)
MCH: 32.6 pg (ref 26.0–34.0)
MCHC: 33.7 g/dL (ref 30.0–36.0)
MCV: 96.7 fL (ref 80.0–100.0)
Monocytes Absolute: 1.3 10*3/uL — ABNORMAL HIGH (ref 0.1–1.0)
Monocytes Relative: 9 %
Neutro Abs: 11.5 10*3/uL — ABNORMAL HIGH (ref 1.7–7.7)
Neutrophils Relative %: 75 %
Platelet Count: 141 10*3/uL — ABNORMAL LOW (ref 150–400)
RBC: 3.07 MIL/uL — ABNORMAL LOW (ref 4.22–5.81)
RDW: 16.3 % — ABNORMAL HIGH (ref 11.5–15.5)
WBC Count: 15.2 10*3/uL — ABNORMAL HIGH (ref 4.0–10.5)
nRBC: 0 % (ref 0.0–0.2)

## 2021-03-09 ENCOUNTER — Inpatient Hospital Stay: Payer: Medicare Other

## 2021-03-09 ENCOUNTER — Other Ambulatory Visit: Payer: Self-pay

## 2021-03-09 DIAGNOSIS — Z5112 Encounter for antineoplastic immunotherapy: Secondary | ICD-10-CM | POA: Diagnosis not present

## 2021-03-09 DIAGNOSIS — G629 Polyneuropathy, unspecified: Secondary | ICD-10-CM | POA: Diagnosis not present

## 2021-03-09 DIAGNOSIS — I700 Atherosclerosis of aorta: Secondary | ICD-10-CM | POA: Diagnosis not present

## 2021-03-09 DIAGNOSIS — C7972 Secondary malignant neoplasm of left adrenal gland: Secondary | ICD-10-CM | POA: Diagnosis not present

## 2021-03-09 DIAGNOSIS — Z79899 Other long term (current) drug therapy: Secondary | ICD-10-CM | POA: Diagnosis not present

## 2021-03-09 DIAGNOSIS — Z86010 Personal history of colonic polyps: Secondary | ICD-10-CM | POA: Diagnosis not present

## 2021-03-09 DIAGNOSIS — R918 Other nonspecific abnormal finding of lung field: Secondary | ICD-10-CM | POA: Diagnosis not present

## 2021-03-09 DIAGNOSIS — N281 Cyst of kidney, acquired: Secondary | ICD-10-CM | POA: Diagnosis not present

## 2021-03-09 DIAGNOSIS — C3492 Malignant neoplasm of unspecified part of left bronchus or lung: Secondary | ICD-10-CM

## 2021-03-09 DIAGNOSIS — M7989 Other specified soft tissue disorders: Secondary | ICD-10-CM | POA: Diagnosis not present

## 2021-03-09 DIAGNOSIS — Z87891 Personal history of nicotine dependence: Secondary | ICD-10-CM | POA: Diagnosis not present

## 2021-03-09 DIAGNOSIS — I10 Essential (primary) hypertension: Secondary | ICD-10-CM | POA: Diagnosis not present

## 2021-03-09 DIAGNOSIS — E119 Type 2 diabetes mellitus without complications: Secondary | ICD-10-CM | POA: Diagnosis not present

## 2021-03-09 DIAGNOSIS — E785 Hyperlipidemia, unspecified: Secondary | ICD-10-CM | POA: Diagnosis not present

## 2021-03-09 DIAGNOSIS — Z85828 Personal history of other malignant neoplasm of skin: Secondary | ICD-10-CM | POA: Diagnosis not present

## 2021-03-09 DIAGNOSIS — C3412 Malignant neoplasm of upper lobe, left bronchus or lung: Secondary | ICD-10-CM | POA: Diagnosis not present

## 2021-03-09 DIAGNOSIS — K429 Umbilical hernia without obstruction or gangrene: Secondary | ICD-10-CM | POA: Diagnosis not present

## 2021-03-09 LAB — CBC WITH DIFFERENTIAL (CANCER CENTER ONLY)
Abs Immature Granulocytes: 0.08 10*3/uL — ABNORMAL HIGH (ref 0.00–0.07)
Basophils Absolute: 0 10*3/uL (ref 0.0–0.1)
Basophils Relative: 0 %
Eosinophils Absolute: 0.2 10*3/uL (ref 0.0–0.5)
Eosinophils Relative: 2 %
HCT: 32.1 % — ABNORMAL LOW (ref 39.0–52.0)
Hemoglobin: 10.6 g/dL — ABNORMAL LOW (ref 13.0–17.0)
Immature Granulocytes: 1 %
Lymphocytes Relative: 15 %
Lymphs Abs: 1.4 10*3/uL (ref 0.7–4.0)
MCH: 32.7 pg (ref 26.0–34.0)
MCHC: 33 g/dL (ref 30.0–36.0)
MCV: 99.1 fL (ref 80.0–100.0)
Monocytes Absolute: 0.9 10*3/uL (ref 0.1–1.0)
Monocytes Relative: 9 %
Neutro Abs: 6.9 10*3/uL (ref 1.7–7.7)
Neutrophils Relative %: 73 %
Platelet Count: 124 10*3/uL — ABNORMAL LOW (ref 150–400)
RBC: 3.24 MIL/uL — ABNORMAL LOW (ref 4.22–5.81)
RDW: 16.2 % — ABNORMAL HIGH (ref 11.5–15.5)
WBC Count: 9.5 10*3/uL (ref 4.0–10.5)
nRBC: 0 % (ref 0.0–0.2)

## 2021-03-09 LAB — CMP (CANCER CENTER ONLY)
ALT: 12 U/L (ref 0–44)
AST: 15 U/L (ref 15–41)
Albumin: 3.6 g/dL (ref 3.5–5.0)
Alkaline Phosphatase: 110 U/L (ref 38–126)
Anion gap: 9 (ref 5–15)
BUN: 16 mg/dL (ref 8–23)
CO2: 26 mmol/L (ref 22–32)
Calcium: 9 mg/dL (ref 8.9–10.3)
Chloride: 104 mmol/L (ref 98–111)
Creatinine: 1.05 mg/dL (ref 0.61–1.24)
GFR, Estimated: >60 mL/min (ref >60)
Glucose, Bld: 140 mg/dL — ABNORMAL HIGH (ref 70–99)
Potassium: 4.6 mmol/L (ref 3.5–5.1)
Sodium: 139 mmol/L (ref 135–145)
Total Bilirubin: 0.4 mg/dL (ref 0.3–1.2)
Total Protein: 6.6 g/dL (ref 6.5–8.1)

## 2021-03-17 ENCOUNTER — Inpatient Hospital Stay: Payer: Medicare Other

## 2021-03-17 ENCOUNTER — Other Ambulatory Visit: Payer: Self-pay

## 2021-03-17 ENCOUNTER — Inpatient Hospital Stay (HOSPITAL_BASED_OUTPATIENT_CLINIC_OR_DEPARTMENT_OTHER): Payer: Medicare Other | Admitting: Internal Medicine

## 2021-03-17 ENCOUNTER — Encounter: Payer: Self-pay | Admitting: Internal Medicine

## 2021-03-17 VITALS — BP 142/79 | HR 77 | Temp 97.5°F | Resp 17 | Wt 241.0 lb

## 2021-03-17 DIAGNOSIS — K429 Umbilical hernia without obstruction or gangrene: Secondary | ICD-10-CM | POA: Diagnosis not present

## 2021-03-17 DIAGNOSIS — C7972 Secondary malignant neoplasm of left adrenal gland: Secondary | ICD-10-CM | POA: Diagnosis not present

## 2021-03-17 DIAGNOSIS — G629 Polyneuropathy, unspecified: Secondary | ICD-10-CM | POA: Diagnosis not present

## 2021-03-17 DIAGNOSIS — M7989 Other specified soft tissue disorders: Secondary | ICD-10-CM | POA: Diagnosis not present

## 2021-03-17 DIAGNOSIS — E785 Hyperlipidemia, unspecified: Secondary | ICD-10-CM | POA: Diagnosis not present

## 2021-03-17 DIAGNOSIS — C3492 Malignant neoplasm of unspecified part of left bronchus or lung: Secondary | ICD-10-CM

## 2021-03-17 DIAGNOSIS — R918 Other nonspecific abnormal finding of lung field: Secondary | ICD-10-CM | POA: Diagnosis not present

## 2021-03-17 DIAGNOSIS — Z85828 Personal history of other malignant neoplasm of skin: Secondary | ICD-10-CM | POA: Diagnosis not present

## 2021-03-17 DIAGNOSIS — Z86010 Personal history of colonic polyps: Secondary | ICD-10-CM | POA: Diagnosis not present

## 2021-03-17 DIAGNOSIS — Z87891 Personal history of nicotine dependence: Secondary | ICD-10-CM | POA: Diagnosis not present

## 2021-03-17 DIAGNOSIS — I700 Atherosclerosis of aorta: Secondary | ICD-10-CM | POA: Diagnosis not present

## 2021-03-17 DIAGNOSIS — Z79899 Other long term (current) drug therapy: Secondary | ICD-10-CM | POA: Diagnosis not present

## 2021-03-17 DIAGNOSIS — N281 Cyst of kidney, acquired: Secondary | ICD-10-CM | POA: Diagnosis not present

## 2021-03-17 DIAGNOSIS — Z5112 Encounter for antineoplastic immunotherapy: Secondary | ICD-10-CM | POA: Diagnosis not present

## 2021-03-17 DIAGNOSIS — I10 Essential (primary) hypertension: Secondary | ICD-10-CM | POA: Diagnosis not present

## 2021-03-17 DIAGNOSIS — E119 Type 2 diabetes mellitus without complications: Secondary | ICD-10-CM | POA: Diagnosis not present

## 2021-03-17 DIAGNOSIS — C3412 Malignant neoplasm of upper lobe, left bronchus or lung: Secondary | ICD-10-CM | POA: Diagnosis not present

## 2021-03-17 LAB — CBC WITH DIFFERENTIAL (CANCER CENTER ONLY)
Abs Immature Granulocytes: 0.01 10*3/uL (ref 0.00–0.07)
Basophils Absolute: 0 10*3/uL (ref 0.0–0.1)
Basophils Relative: 1 %
Eosinophils Absolute: 0.1 10*3/uL (ref 0.0–0.5)
Eosinophils Relative: 2 %
HCT: 31.8 % — ABNORMAL LOW (ref 39.0–52.0)
Hemoglobin: 10.6 g/dL — ABNORMAL LOW (ref 13.0–17.0)
Immature Granulocytes: 0 %
Lymphocytes Relative: 22 %
Lymphs Abs: 1.1 10*3/uL (ref 0.7–4.0)
MCH: 32.8 pg (ref 26.0–34.0)
MCHC: 33.3 g/dL (ref 30.0–36.0)
MCV: 98.5 fL (ref 80.0–100.0)
Monocytes Absolute: 0.6 10*3/uL (ref 0.1–1.0)
Monocytes Relative: 13 %
Neutro Abs: 3.1 10*3/uL (ref 1.7–7.7)
Neutrophils Relative %: 62 %
Platelet Count: 144 10*3/uL — ABNORMAL LOW (ref 150–400)
RBC: 3.23 MIL/uL — ABNORMAL LOW (ref 4.22–5.81)
RDW: 15.9 % — ABNORMAL HIGH (ref 11.5–15.5)
WBC Count: 5 10*3/uL (ref 4.0–10.5)
nRBC: 0 % (ref 0.0–0.2)

## 2021-03-17 LAB — CMP (CANCER CENTER ONLY)
ALT: 8 U/L (ref 0–44)
AST: 13 U/L — ABNORMAL LOW (ref 15–41)
Albumin: 3.6 g/dL (ref 3.5–5.0)
Alkaline Phosphatase: 84 U/L (ref 38–126)
Anion gap: 8 (ref 5–15)
BUN: 19 mg/dL (ref 8–23)
CO2: 25 mmol/L (ref 22–32)
Calcium: 9.1 mg/dL (ref 8.9–10.3)
Chloride: 105 mmol/L (ref 98–111)
Creatinine: 1.04 mg/dL (ref 0.61–1.24)
GFR, Estimated: >60 mL/min (ref >60)
Glucose, Bld: 151 mg/dL — ABNORMAL HIGH (ref 70–99)
Potassium: 4.9 mmol/L (ref 3.5–5.1)
Sodium: 138 mmol/L (ref 135–145)
Total Bilirubin: 0.5 mg/dL (ref 0.3–1.2)
Total Protein: 6.6 g/dL (ref 6.5–8.1)

## 2021-03-17 MED ORDER — SODIUM CHLORIDE 0.9 % IV SOLN
200.0000 mg | Freq: Once | INTRAVENOUS | Status: AC
  Administered 2021-03-17: 200 mg via INTRAVENOUS
  Filled 2021-03-17: qty 8

## 2021-03-17 MED ORDER — SODIUM CHLORIDE 0.9 % IV SOLN
Freq: Once | INTRAVENOUS | Status: AC
Start: 2021-03-17 — End: 2021-03-17

## 2021-03-17 NOTE — Patient Instructions (Signed)
[facility]  Discharge Instructions: Thank you for choosing [facility] to provide your oncology and hematology care.   If you have a lab appointment with the [facility], please go directly to the [facility] and check in at the registration area.   Wear comfortable clothing and clothing appropriate for easy access to any Portacath or PICC line.   We strive to give you quality time with your provider. You may need to reschedule your appointment if you arrive late (15 or more minutes).  Arriving late affects you and other patients whose appointments are after yours.  Also, if you miss three or more appointments without notifying the office, you may be dismissed from the clinic at the provider's discretion.      For prescription refill requests, have your pharmacy contact our office and allow 72 hours for refills to be completed.    Today you received the following chemotherapy and/or immunotherapy agents Beryle Flock      To help prevent nausea and vomiting after your treatment, we encourage you to take your nausea medication as directed.  BELOW ARE SYMPTOMS THAT SHOULD BE REPORTED IMMEDIATELY: *FEVER GREATER THAN 100.4 F (38 C) OR HIGHER *CHILLS OR SWEATING *NAUSEA AND VOMITING THAT IS NOT CONTROLLED WITH YOUR NAUSEA MEDICATION *UNUSUAL SHORTNESS OF BREATH *UNUSUAL BRUISING OR BLEEDING *URINARY PROBLEMS (pain or burning when urinating, or frequent urination) *BOWEL PROBLEMS (unusual diarrhea, constipation, pain near the anus) TENDERNESS IN MOUTH AND THROAT WITH OR WITHOUT PRESENCE OF ULCERS (sore throat, sores in mouth, or a toothache) UNUSUAL RASH, SWELLING OR PAIN  UNUSUAL VAGINAL DISCHARGE OR ITCHING   Items with * indicate a potential emergency and should be followed up as soon as possible or go to the Emergency Department if any problems should occur.  Please show the CHEMOTHERAPY ALERT CARD or IMMUNOTHERAPY ALERT CARD at check-in to  the Emergency Department and triage nurse.  Should you have questions after your visit or need to cancel or reschedule your appointment, please contact [facility]  Dept: [phone]  and follow the prompts.  Office hours are 8:00 a.m. to 4:30 p.m. Monday - Friday. Please note that voicemails left after 4:00 p.m. may not be returned until the following business day.  We are closed weekends and major holidays. You have access to a nurse at all times for urgent questions. Please call the main number to the clinic Dept: [phone] and follow the prompts.   For any non-urgent questions, you may also contact your provider using MyChart. We now offer e-Visits for anyone 79 and older to request care online for non-urgent symptoms. For details visit [email or web address].   Also download the MyChart app! Go to the app store, search "MyChart", open the app, select , and log in with your MyChart username and password.  Due to Covid, a mask is required upon entering the hospital/clinic. If you do not have a mask, one will be given to you upon arrival. For doctor visits, patients may have 1 support person aged 24 or older with them. For treatment visits, patients cannot have anyone with them due to current Covid guidelines and our immunocompromised population.

## 2021-03-17 NOTE — Progress Notes (Signed)
[place] Telephone:([phone]) [phone]   [phone]) [phone]  OFFICE PROGRESS NOTE  [name], [place] [address]  DIAGNOSIS: Stage IV (T3, N2, M1c) non-small cell lung cancer, squamous cell carcinoma presented with large left upper lobe lung mass in addition to left hilar and mediastinal lymphadenopathy as well as bilateral pulmonary nodules and left adrenal gland lesion diagnosed in May 2022.  PRIOR THERAPY: None  CURRENT THERAPY: Palliative systemic chemotherapy with carboplatin for an AUC of 5, paclitaxel 175 mg/m2 and Keytruda 200 mg IV every 3 weeks.  First dose expected on 12/22/2020. Status post 4 cycles.  Starting from cycle #5 the patient will be on maintenance treatment with single agent Keytruda 200 Mg IV every 3 weeks.   INTERVAL HISTORY: Alex Edwards 85 y.o. male returns to the clinic today for follow-up visit.  The patient is feeling fine today with no concerning complaints except for mild fatigue as well as peripheral neuropathy in the fingers and toes.  He also has some mild swelling of the lower extremities.  He denied having any current chest pain, shortness of breath, cough or hemoptysis.  He denied having any fever or chills.  He has no nausea, vomiting, diarrhea or constipation.  He has no headache or visual changes.  He continues to tolerate his treatment fairly well.  He is here today for evaluation before starting the first cycle of the maintenance therapy with Keytruda.   MEDICAL HISTORY: Past Medical History:  Diagnosis Date   BCE (basal cell epithelioma)    Bruises easily    on hands   Cough    smokers cough or perfurmes   Degeneration macular    right eye   Diabetes mellitus without complication (HCC)    Dyslipidemia    ED (erectile dysfunction)    Hx of adenomatous colonic polyps    Hyperlipidemia    Hypertension    borderline   Obesity    Psoriasis    Seasonal allergies    Smoker    former     ALLERGIES:  is allergic to vancomycin, amlodipine, and latex.  MEDICATIONS:  Current Outpatient Medications  Medication Sig Dispense Refill   Ascorbic Acid (VITAMIN C) 1000 MG tablet Take 1,000 mg by mouth daily.     fexofenadine (ALLEGRA) 180 MG tablet Take 180 mg by mouth daily.     loratadine (CLARITIN) 10 MG tablet Take 10 mg by mouth daily.     metoprolol tartrate (LOPRESSOR) 50 MG tablet TAKE ONE TABLET BY MOUTH TWICE DAILY. 180 tablet 0   Multiple Vitamin (MULTI VITAMIN DAILY PO) Take 1 tablet by mouth daily.     Multiple Vitamins-Minerals (PRESERVISION AREDS 2) CAPS Take 1 capsule by mouth in the morning and at bedtime.     [place] Patient is to test one time a day DX: E11.9 100 each 4   ONETOUCH VERIO test strip USE TO TEST BLOOD SUGAR ONCE DAILY. 50 strip 2   prochlorperazine (COMPAZINE) 10 MG tablet Take 1 tablet (10 mg total) by mouth every 6 (six) hours as needed for nausea or vomiting. 30 tablet 0   tamsulosin (FLOMAX) 0.4 MG CAPS capsule Take 1 capsule (0.4 mg total) by mouth daily after supper. 30 capsule 3   triamcinolone cream (KENALOG) 0.1 % APPLY TO AFFECTED AREA TWICE A DAY. 454 g 0   losartan (COZAAR) 100 MG tablet Take 1 tablet (100 mg total) by  mouth daily. 90 tablet 1   No current facility-administered medications for this visit.    SURGICAL HISTORY:  Past Surgical History:  Procedure Laterality Date   BRONCHIAL BRUSHINGS  11/18/2020   Procedure: BRONCHIAL BRUSHINGS;  Surgeon: Spero Geralds, MD;  Location: [facility];  Service: Pulmonary;;   BRONCHIAL NEEDLE ASPIRATION BIOPSY  11/18/2020   Procedure: BRONCHIAL NEEDLE ASPIRATION BIOPSIES;  Surgeon: Spero Geralds, MD;  Location: [facility];  Service: Pulmonary;;   BRONCHIAL WASHINGS  11/18/2020   Procedure: BRONCHIAL WASHINGS;  Surgeon: Spero Geralds, MD;  Location: [facility];  Service: Pulmonary;;   CATARACT EXTRACTION, BILATERAL Bilateral 2013   COLONOSCOPY  2005    [facility]   ENDOBRONCHIAL ULTRASOUND N/A 11/18/2020   Procedure: ENDOBRONCHIAL ULTRASOUND;  Surgeon: Spero Geralds, MD;  Location: [facility];  Service: Pulmonary;  Laterality: N/A;   POLYPECTOMY     SKIN CANCER EXCISION Right 1993   under eye-MOHS, freeze multiple places freq   TONSILLECTOMY  as child   TOTAL HIP ARTHROPLASTY Left 01/04/2014   Procedure: LEFT TOTAL HIP ARTHROPLASTY ANTERIOR APPROACH;  Surgeon: Mcarthur Rossetti, MD;  Location: [facility];  Service: Orthopedics;  Laterality: Left;   TOTAL HIP ARTHROPLASTY Left 02/14/2014   Procedure: Irrigation and Debridement left hip;  Surgeon: Mcarthur Rossetti, MD;  Location: [facility];  Service: Orthopedics;  Laterality: Left;    REVIEW OF SYSTEMS:  A comprehensive review of systems was negative except for: Constitutional: positive for fatigue Neurological: positive for paresthesia   PHYSICAL EXAMINATION: General appearance: alert, cooperative, fatigued, and no distress Head: Normocephalic, without obvious abnormality, atraumatic Neck: no adenopathy, no JVD, supple, symmetrical, trachea midline, and thyroid not enlarged, symmetric, no tenderness/mass/nodules Lymph nodes: Cervical, supraclavicular, and axillary nodes normal. Resp: clear to auscultation bilaterally Back: symmetric, no curvature. ROM normal. No CVA tenderness. Cardio: regular rate and rhythm, S1, S2 normal, no murmur, click, rub or gallop GI: soft, non-tender; bowel sounds normal; no masses,  no organomegaly Extremities: extremities normal, atraumatic, no cyanosis or edema  ECOG PERFORMANCE STATUS: 1 - Symptomatic but completely ambulatory  Blood pressure (!) 142/79, pulse 77, temperature (!) 97.5 F (36.4 C), temperature source Oral, resp. rate 17, weight 241 lb (109.3 kg), SpO2 96 %.  LABORATORY DATA: Lab Results  Component Value Date   WBC 5.0 03/17/2021   HGB 10.6 (L) 03/17/2021   HCT 31.8 (L) 03/17/2021   MCV 98.5 03/17/2021   PLT 144 (L) 03/17/2021       Chemistry      Component Value Date/Time   NA 138 03/17/2021 1047   NA 141 01/29/2020 1157   K 4.9 03/17/2021 1047   CL 105 03/17/2021 1047   CO2 25 03/17/2021 1047   BUN 19 03/17/2021 1047   BUN 12 01/29/2020 1157   CREATININE 1.04 03/17/2021 1047   CREATININE 1.11 07/26/2016 0921      Component Value Date/Time   CALCIUM 9.1 03/17/2021 1047   ALKPHOS 84 03/17/2021 1047   AST 13 (L) 03/17/2021 1047   ALT 8 03/17/2021 1047   BILITOT 0.5 03/17/2021 1047       RADIOGRAPHIC STUDIES: CT Chest W Contrast  Result Date: 02/20/2021 CLINICAL DATA:  Primary Cancer Type: Lung Imaging Indication: Assess response to therapy Interval therapy since last imaging? Yes Initial Cancer Diagnosis Date: 11/18/2020; Established by: Biopsy-proven Detailed Pathology: Stage IV non-small cell lung cancer, squamous cell carcinoma. Primary Tumor location: Left upper lobe.  Left adrenal gland lesion. Surgeries: No thoracic.  Hip  arthroplasty. Chemotherapy: Yes; Ongoing? Yes; Most recent administration: 02/02/2021 Immunotherapy?  Yes; Type: Keytruda; Ongoing? Yes Radiation therapy? 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 EXAM: CT CHEST, ABDOMEN, AND PELVIS WITH CONTRAST TECHNIQUE: Multidetector CT imaging of the chest, abdomen and pelvis was performed following the standard protocol during bolus administration of intravenous contrast. CONTRAST:  40mL OMNIPAQUE IOHEXOL 350 MG/ML SOLN COMPARISON:  12/05/2020 PET-CT.  Most recent CT chest 11/17/2020. FINDINGS: CT CHEST FINDINGS Cardiovascular: Heart size appears normal. No pericardial aortic atherosclerosis and coronary artery calcifications. Mediastinum/Nodes: Normal appearance of the thyroid gland. The trachea appears patent and is midline. Normal appearance of the esophagus. Left paratracheal lymph node measures 1 cm, image 25/2. Formally 1.4 cm. Subcarinal lymph node measures 0.8 cm, image 30/2.  Formally 1.3 cm. Left  hilar lymph node measures 1.1 cm, image 33/2.  Formally 1.2 cm. Lungs/Pleura: The left upper lobe lung mass measures 5.7 x 4.5 cm, image 59/6. Previously 8.1 x 5.8 cm. Increase central cavitation compatible with tumor necrosis. Anteromedial subpleural mass within the left upper lobe measures 1.3 x 0.7 cm, image 73/6. Previously 2.6 x 1.9 cm. Previous 6 mm nodule in the superior segment of left lower lobe now measures 4 mm, image 76/6. Previous 7 mm lung nodule within the inferior lingula has resolved in the interval. Several new lung nodules are identified, these include: -Within the superior segment of left lower lobe there is a new small nodule measuring 1.2 x 0.9 cm, image 73/6. -Within the posterior right upper lobe there is a new subpleural nodule which measures 1.3 x 1.2 cm, image 26/6. -Within the posterior lung base there is a new subpleural nodule measuring 1.2 x 0.6 cm, image 118/6. -Within the central aspect of the posteromedial right upper lobe there is a new conglomeration of nodules versus part solid nodule measuring 1.9 by 1.7 cm, image 55/6. Multiple scattered clustered areas of tree-in-bud nodularity are again seen throughout both lungs. These are favored to represent inflammatory or infectious process. Musculoskeletal: No chest wall mass or suspicious bone lesions identified. CT ABDOMEN PELVIS FINDINGS Hepatobiliary: No focal liver abnormality is seen. No gallstones, gallbladder wall thickening, or biliary dilatation. Pancreas: Unremarkable. No pancreatic ductal dilatation or surrounding inflammatory changes. Spleen: Normal in size without focal abnormality. Adrenals/Urinary Tract: Normal right adrenal gland. Stable left adrenal nodule measuring 1.6 cm, image 65/2. Small bilateral kidney cysts. Small stones are again identified clustered within the posterior right bladder measuring up to 5 mm. Stomach/Bowel: Stomach is within normal limits. Appendix appears normal. No evidence of bowel wall  thickening, distention, or inflammatory changes. Distal colonic diverticula identified without signs of acute inflammation. Vascular/Lymphatic: Aortic atherosclerosis. No enlarged lymph nodes within the abdomen or pelvis. Reproductive: Prostate gland is enlarged with mass effect upon the bladder base. Other: No ascites or focal fluid collections. Small fat containing umbilical hernia. Musculoskeletal: Left hip arthroplasty. No acute or suspicious bone lesions. IMPRESSION: 1. The dominant left upper lobe lung mass has decreased in size in the interval. There has also been interval decrease in size of mediastinal and left hilar lymph nodes. The subpleural nodule within the anteromedial left upper lobe has also decreased in the interval. 2. Small scattered lung nodules are again seen throughout both lungs. Several of the previously noted nodules have decreased in size or resolved in the interval. Several new lung nodules however are noted within the right upper lobe, right lung base and superior segment of left lower lobe. Although the appearance of  these nodules are nonspecific and may reflect inflammatory or infectious process metastatic disease can not be excluded. 3. Similar appearance of clustered areas of tree-in-bud nodularity throughout both lungs which are nonspecific but favored to represent inflammatory or infectious process. 4. Stable left adrenal nodule. 5. Aortic atherosclerosis and coronary artery calcifications. Aortic Atherosclerosis (ICD10-I70.0). Electronically Signed   By: Kerby Moors M.D.   On: 02/20/2021 10:55   CT Abdomen Pelvis W Contrast  Result Date: 02/20/2021 CLINICAL DATA:  Primary Cancer Type: Lung Imaging Indication: Assess response to therapy Interval therapy since last imaging? Yes Initial Cancer Diagnosis Date: 11/18/2020; Established by: Biopsy-proven Detailed Pathology: Stage IV non-small cell lung cancer, squamous cell carcinoma. Primary Tumor location: Left upper lobe.  Left  adrenal gland lesion. Surgeries: No thoracic.  Hip arthroplasty. Chemotherapy: Yes; Ongoing? Yes; Most recent administration: 02/02/2021 Immunotherapy?  Yes; Type: Keytruda; Ongoing? Yes Radiation therapy? 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 EXAM: CT CHEST, ABDOMEN, AND PELVIS WITH CONTRAST TECHNIQUE: Multidetector CT imaging of the chest, abdomen and pelvis was performed following the standard protocol during bolus administration of intravenous contrast. CONTRAST:  80mL OMNIPAQUE IOHEXOL 350 MG/ML SOLN COMPARISON:  12/05/2020 PET-CT.  Most recent CT chest 11/17/2020. FINDINGS: CT CHEST FINDINGS Cardiovascular: Heart size appears normal. No pericardial aortic atherosclerosis and coronary artery calcifications. Mediastinum/Nodes: Normal appearance of the thyroid gland. The trachea appears patent and is midline. Normal appearance of the esophagus. Left paratracheal lymph node measures 1 cm, image 25/2. Formally 1.4 cm. Subcarinal lymph node measures 0.8 cm, image 30/2.  Formally 1.3 cm. Left hilar lymph node measures 1.1 cm, image 33/2.  Formally 1.2 cm. Lungs/Pleura: The left upper lobe lung mass measures 5.7 x 4.5 cm, image 59/6. Previously 8.1 x 5.8 cm. Increase central cavitation compatible with tumor necrosis. Anteromedial subpleural mass within the left upper lobe measures 1.3 x 0.7 cm, image 73/6. Previously 2.6 x 1.9 cm. Previous 6 mm nodule in the superior segment of left lower lobe now measures 4 mm, image 76/6. Previous 7 mm lung nodule within the inferior lingula has resolved in the interval. Several new lung nodules are identified, these include: -Within the superior segment of left lower lobe there is a new small nodule measuring 1.2 x 0.9 cm, image 73/6. -Within the posterior right upper lobe there is a new subpleural nodule which measures 1.3 x 1.2 cm, image 26/6. -Within the posterior lung base there is a new subpleural nodule measuring 1.2 x 0.6 cm, image  118/6. -Within the central aspect of the posteromedial right upper lobe there is a new conglomeration of nodules versus part solid nodule measuring 1.9 by 1.7 cm, image 55/6. Multiple scattered clustered areas of tree-in-bud nodularity are again seen throughout both lungs. These are favored to represent inflammatory or infectious process. Musculoskeletal: No chest wall mass or suspicious bone lesions identified. CT ABDOMEN PELVIS FINDINGS Hepatobiliary: No focal liver abnormality is seen. No gallstones, gallbladder wall thickening, or biliary dilatation. Pancreas: Unremarkable. No pancreatic ductal dilatation or surrounding inflammatory changes. Spleen: Normal in size without focal abnormality. Adrenals/Urinary Tract: Normal right adrenal gland. Stable left adrenal nodule measuring 1.6 cm, image 65/2. Small bilateral kidney cysts. Small stones are again identified clustered within the posterior right bladder measuring up to 5 mm. Stomach/Bowel: Stomach is within normal limits. Appendix appears normal. No evidence of bowel wall thickening, distention, or inflammatory changes. Distal colonic diverticula identified without signs of acute inflammation. Vascular/Lymphatic: Aortic atherosclerosis. No enlarged lymph nodes within the abdomen  or pelvis. Reproductive: Prostate gland is enlarged with mass effect upon the bladder base. Other: No ascites or focal fluid collections. Small fat containing umbilical hernia. Musculoskeletal: Left hip arthroplasty. No acute or suspicious bone lesions. IMPRESSION: 1. The dominant left upper lobe lung mass has decreased in size in the interval. There has also been interval decrease in size of mediastinal and left hilar lymph nodes. The subpleural nodule within the anteromedial left upper lobe has also decreased in the interval. 2. Small scattered lung nodules are again seen throughout both lungs. Several of the previously noted nodules have decreased in size or resolved in the interval.  Several new lung nodules however are noted within the right upper lobe, right lung base and superior segment of left lower lobe. Although the appearance of these nodules are nonspecific and may reflect inflammatory or infectious process metastatic disease can not be excluded. 3. Similar appearance of clustered areas of tree-in-bud nodularity throughout both lungs which are nonspecific but favored to represent inflammatory or infectious process. 4. Stable left adrenal nodule. 5. Aortic atherosclerosis and coronary artery calcifications. Aortic Atherosclerosis (ICD10-I70.0). Electronically Signed   By: Kerby Moors M.D.   On: 02/20/2021 10:55    ASSESSMENT AND PLAN: This is a very pleasant 85 years old white male recently diagnosed with Stage IV (T3, N2, M1c) non-small cell lung cancer, squamous cell carcinoma presented with large left upper lobe lung mass in addition to left hilar and mediastinal lymphadenopathy as well as bilateral pulmonary nodules and left adrenal gland lesion diagnosed in May 2022. The patient is currently undergoing systemic chemotherapy with carboplatin for AUC of 5, paclitaxel 175 Mg/M2 and Keytruda 200 Mg IV every 3 weeks with Neulasta support status post 4 cycles.  Starting from cycle #5 the patient is on maintenance treatment with Keytruda 200 Mg IV every 3 weeks. He tolerated the last cycle of his treatment well with no concerning adverse effect except for the mild fatigue and peripheral neuropathy. I recommended for him to proceed with cycle #5 today with [facility] as planned. I will see him back for follow-up visit in 3 weeks for evaluation before starting cycle #6. The patient was advised to call immediately if he has any other concerning symptoms in the interval.  All questions were answered. The patient knows to call the clinic with any problems, questions or concerns. We can certainly see the patient much sooner if necessary.  Disclaimer: This note was dictated with voice  recognition software. Similar sounding words can inadvertently be transcribed and may not be corrected upon review.

## 2021-03-25 DIAGNOSIS — C3492 Malignant neoplasm of unspecified part of left bronchus or lung: Secondary | ICD-10-CM | POA: Diagnosis not present

## 2021-03-25 DIAGNOSIS — J9601 Acute respiratory failure with hypoxia: Secondary | ICD-10-CM | POA: Diagnosis not present

## 2021-04-03 ENCOUNTER — Other Ambulatory Visit: Payer: Self-pay

## 2021-04-03 DIAGNOSIS — Z5111 Encounter for antineoplastic chemotherapy: Secondary | ICD-10-CM

## 2021-04-03 DIAGNOSIS — Z5112 Encounter for antineoplastic immunotherapy: Secondary | ICD-10-CM

## 2021-04-03 DIAGNOSIS — C3492 Malignant neoplasm of unspecified part of left bronchus or lung: Secondary | ICD-10-CM

## 2021-04-03 DIAGNOSIS — C3490 Malignant neoplasm of unspecified part of unspecified bronchus or lung: Secondary | ICD-10-CM

## 2021-04-06 ENCOUNTER — Other Ambulatory Visit: Payer: Self-pay

## 2021-04-06 ENCOUNTER — Inpatient Hospital Stay: Payer: Medicare Other

## 2021-04-06 ENCOUNTER — Inpatient Hospital Stay: Payer: Medicare Other | Attending: Internal Medicine | Admitting: Internal Medicine

## 2021-04-06 ENCOUNTER — Telehealth: Payer: Self-pay | Admitting: Internal Medicine

## 2021-04-06 VITALS — BP 159/67 | HR 86 | Temp 97.7°F | Resp 20 | Ht 72.75 in | Wt 243.1 lb

## 2021-04-06 DIAGNOSIS — C3490 Malignant neoplasm of unspecified part of unspecified bronchus or lung: Secondary | ICD-10-CM

## 2021-04-06 DIAGNOSIS — E785 Hyperlipidemia, unspecified: Secondary | ICD-10-CM | POA: Diagnosis not present

## 2021-04-06 DIAGNOSIS — E119 Type 2 diabetes mellitus without complications: Secondary | ICD-10-CM | POA: Diagnosis not present

## 2021-04-06 DIAGNOSIS — C3492 Malignant neoplasm of unspecified part of left bronchus or lung: Secondary | ICD-10-CM | POA: Diagnosis not present

## 2021-04-06 DIAGNOSIS — G629 Polyneuropathy, unspecified: Secondary | ICD-10-CM | POA: Insufficient documentation

## 2021-04-06 DIAGNOSIS — I10 Essential (primary) hypertension: Secondary | ICD-10-CM | POA: Insufficient documentation

## 2021-04-06 DIAGNOSIS — C3412 Malignant neoplasm of upper lobe, left bronchus or lung: Secondary | ICD-10-CM | POA: Insufficient documentation

## 2021-04-06 DIAGNOSIS — Z5111 Encounter for antineoplastic chemotherapy: Secondary | ICD-10-CM

## 2021-04-06 DIAGNOSIS — Z87891 Personal history of nicotine dependence: Secondary | ICD-10-CM | POA: Diagnosis not present

## 2021-04-06 DIAGNOSIS — Z79899 Other long term (current) drug therapy: Secondary | ICD-10-CM | POA: Insufficient documentation

## 2021-04-06 DIAGNOSIS — Z5112 Encounter for antineoplastic immunotherapy: Secondary | ICD-10-CM | POA: Diagnosis not present

## 2021-04-06 DIAGNOSIS — E669 Obesity, unspecified: Secondary | ICD-10-CM | POA: Diagnosis not present

## 2021-04-06 LAB — CBC WITH DIFFERENTIAL (CANCER CENTER ONLY)
Abs Immature Granulocytes: 0.02 10*3/uL (ref 0.00–0.07)
Basophils Absolute: 0 10*3/uL (ref 0.0–0.1)
Basophils Relative: 1 %
Eosinophils Absolute: 0.3 10*3/uL (ref 0.0–0.5)
Eosinophils Relative: 6 %
HCT: 34.2 % — ABNORMAL LOW (ref 39.0–52.0)
Hemoglobin: 11.6 g/dL — ABNORMAL LOW (ref 13.0–17.0)
Immature Granulocytes: 0 %
Lymphocytes Relative: 21 %
Lymphs Abs: 1.1 10*3/uL (ref 0.7–4.0)
MCH: 33 pg (ref 26.0–34.0)
MCHC: 33.9 g/dL (ref 30.0–36.0)
MCV: 97.4 fL (ref 80.0–100.0)
Monocytes Absolute: 0.6 10*3/uL (ref 0.1–1.0)
Monocytes Relative: 11 %
Neutro Abs: 3.2 10*3/uL (ref 1.7–7.7)
Neutrophils Relative %: 61 %
Platelet Count: 153 10*3/uL (ref 150–400)
RBC: 3.51 MIL/uL — ABNORMAL LOW (ref 4.22–5.81)
RDW: 13.3 % (ref 11.5–15.5)
WBC Count: 5.2 10*3/uL (ref 4.0–10.5)
nRBC: 0 % (ref 0.0–0.2)

## 2021-04-06 LAB — COMPREHENSIVE METABOLIC PANEL
ALT: 11 U/L (ref 0–44)
AST: 15 U/L (ref 15–41)
Albumin: 3.7 g/dL (ref 3.5–5.0)
Alkaline Phosphatase: 81 U/L (ref 38–126)
Anion gap: 7 (ref 5–15)
BUN: 16 mg/dL (ref 8–23)
CO2: 26 mmol/L (ref 22–32)
Calcium: 9.2 mg/dL (ref 8.9–10.3)
Chloride: 105 mmol/L (ref 98–111)
Creatinine, Ser: 1.05 mg/dL (ref 0.61–1.24)
GFR, Estimated: >60 mL/min (ref >60)
Glucose, Bld: 111 mg/dL — ABNORMAL HIGH (ref 70–99)
Potassium: 4.4 mmol/L (ref 3.5–5.1)
Sodium: 138 mmol/L (ref 135–145)
Total Bilirubin: 0.5 mg/dL (ref 0.3–1.2)
Total Protein: 6.8 g/dL (ref 6.5–8.1)

## 2021-04-06 LAB — TSH: TSH: 4.77 u[IU]/mL — ABNORMAL HIGH (ref 0.320–4.118)

## 2021-04-06 MED ORDER — SODIUM CHLORIDE 0.9 % IV SOLN: Freq: Once | INTRAVENOUS | Status: AC

## 2021-04-06 MED ORDER — SODIUM CHLORIDE 0.9 % IV SOLN
200.0000 mg | Freq: Once | INTRAVENOUS | Status: AC
Start: 2021-04-06 — End: 2021-04-06
  Administered 2021-04-06: 200 mg via INTRAVENOUS
  Filled 2021-04-06: qty 8

## 2021-04-06 NOTE — Telephone Encounter (Signed)
Scheduled follow-up appointment per 10/10 los. Patient is aware.

## 2021-04-06 NOTE — Patient Instructions (Signed)
[facility]  Discharge Instructions: ?Thank you for choosing [facility] to provide your oncology and hematology care.  ? ?If you have a lab appointment with the Cancer Center, please go directly to the Cancer Center and check in at the registration area. ?  ?Wear comfortable clothing and clothing appropriate for easy access to any Portacath or PICC line.  ? ?We strive to give you quality time with your provider. You may need to reschedule your appointment if you arrive late (15 or more minutes).  Arriving late affects you and other patients whose appointments are after yours.  Also, if you miss three or more appointments without notifying the office, you may be dismissed from the clinic at the provider?s discretion.    ?  ?For prescription refill requests, have your pharmacy contact our office and allow 72 hours for refills to be completed.   ? ?Today you received the following chemotherapy and/or immunotherapy agents: Keytruda ?  ?To help prevent nausea and vomiting after your treatment, we encourage you to take your nausea medication as directed. ? ?BELOW ARE SYMPTOMS THAT SHOULD BE REPORTED IMMEDIATELY: ?*FEVER GREATER THAN 100.4 F (38 ?C) OR HIGHER ?*CHILLS OR SWEATING ?*NAUSEA AND VOMITING THAT IS NOT CONTROLLED WITH YOUR NAUSEA MEDICATION ?*UNUSUAL SHORTNESS OF BREATH ?*UNUSUAL BRUISING OR BLEEDING ?*URINARY PROBLEMS (pain or burning when urinating, or frequent urination) ?*BOWEL PROBLEMS (unusual diarrhea, constipation, pain near the anus) ?TENDERNESS IN MOUTH AND THROAT WITH OR WITHOUT PRESENCE OF ULCERS (sore throat, sores in mouth, or a toothache) ?UNUSUAL RASH, SWELLING OR PAIN  ?UNUSUAL VAGINAL DISCHARGE OR ITCHING  ? ?Items with * indicate a potential emergency and should be followed up as soon as possible or go to the Emergency Department if any problems should occur. ? ?Please show the CHEMOTHERAPY ALERT CARD or IMMUNOTHERAPY ALERT CARD at check-in to the  Emergency Department and triage nurse. ? ?Should you have questions after your visit or need to cancel or reschedule your appointment, please contact [facility]  Dept: [phone]  and follow the prompts.  Office hours are 8:00 a.m. to 4:30 p.m. Monday - Friday. Please note that voicemails left after 4:00 p.m. may not be returned until the following business day.  We are closed weekends and major holidays. You have access to a nurse at all times for urgent questions. Please call the main number to the clinic Dept: [phone] and follow the prompts. ? ? ?For any non-urgent questions, you may also contact your provider using MyChart. We now offer e-Visits for anyone 18 and older to request care online for non-urgent symptoms. For details visit [email or web address]. ?  ?Also download the MyChart app! Go to the app store, search "MyChart", open the app, select [facility], and log in with your MyChart username and password. ? ?Due to Covid, a mask is required upon entering the hospital/clinic. If you do not have a mask, one will be given to you upon arrival. For doctor visits, patients may have 1 support person aged 18 or older with them. For treatment visits, patients cannot have anyone with them due to current Covid guidelines and our immunocompromised population.  ? ?

## 2021-04-06 NOTE — Progress Notes (Signed)
[place] [phone]   [phone]  OFFICE PROGRESS NOTE  Alex Edwards, [place] [address]  DIAGNOSIS: Stage IV (T3, N2, M1c) non-small cell Edwards cancer, squamous cell carcinoma presented with large left upper lobe Edwards mass in addition to left hilar and mediastinal lymphadenopathy as well as bilateral pulmonary nodules and left adrenal gland lesion diagnosed in May 2022.  PRIOR THERAPY: None  CURRENT THERAPY: Palliative systemic chemotherapy with carboplatin for an AUC of 5, paclitaxel 175 mg/m2 and Keytruda 200 mg IV every 3 weeks.  First dose expected on 12/22/2020. Status post 5 cycles.  Starting from cycle #5 the patient will be on maintenance treatment with single agent Keytruda 200 Mg IV every 3 weeks.   INTERVAL HISTORY: Alex Edwards 85 y.o. male returns to the clinic today for follow-up visit accompanied by his wife.  The patient is feeling fine today with no concerning complaints except for mild peripheral neuropathy especially in the feet.  He also has a swelling of the lower extremities.  He is currently on losartan and metoprolol for his hypertension.  He denied having any current chest pain, shortness of breath, cough or hemoptysis.  He denied having any fever or chills.  He has no nausea, vomiting, diarrhea or constipation.  He has no headache or visual changes.  He denied having any recent weight loss or night sweats.  The patient is here today for evaluation before starting cycle #6 of his treatment.    MEDICAL HISTORY: Past Medical History:  Diagnosis Date   BCE (basal cell epithelioma)    Bruises easily    on hands   Cough    smokers cough or perfurmes   Degeneration macular    right eye   Diabetes mellitus without complication (HCC)    Dyslipidemia    ED (erectile dysfunction)    Hx of adenomatous colonic polyps    Hyperlipidemia    Hypertension    borderline   Obesity    Psoriasis     Seasonal allergies    Smoker    former    ALLERGIES:  is allergic to paclitaxel, vancomycin, amlodipine, and latex.  MEDICATIONS:  Current Outpatient Medications  Medication Sig Dispense Refill   Ascorbic Acid (VITAMIN C) 1000 MG tablet Take 1,000 mg by mouth daily.     fexofenadine (ALLEGRA) 180 MG tablet Take 180 mg by mouth daily.     loratadine (CLARITIN) 10 MG tablet Take 10 mg by mouth daily.     losartan (COZAAR) 100 MG tablet Take 1 tablet (100 mg total) by mouth daily. 90 tablet 1   metoprolol tartrate (LOPRESSOR) 50 MG tablet TAKE ONE TABLET BY MOUTH TWICE DAILY. 180 tablet 0   Multiple Vitamin (MULTI VITAMIN DAILY PO) Take 1 tablet by mouth daily.     Multiple Vitamins-Minerals (PRESERVISION AREDS 2) CAPS Take 1 capsule by mouth in the morning and at bedtime.     [place] Patient is to test one time a day DX: E11.9 100 each 4   ONETOUCH VERIO test strip USE TO TEST BLOOD SUGAR ONCE DAILY. 50 strip 2   prochlorperazine (COMPAZINE) 10 MG tablet Take 1 tablet (10 mg total) by mouth every 6 (six) hours as needed for nausea or vomiting. 30 tablet 0   tamsulosin (FLOMAX) 0.4 MG CAPS capsule Take 1 capsule (0.4 mg total) by mouth daily after supper. 30 capsule 3  triamcinolone cream (KENALOG) 0.1 % APPLY TO AFFECTED AREA TWICE A DAY. 454 g 0   No current facility-administered medications for this visit.    SURGICAL HISTORY:  Past Surgical History:  Procedure Laterality Date   BRONCHIAL BRUSHINGS  11/18/2020   Procedure: BRONCHIAL BRUSHINGS;  Surgeon: Spero Geralds, MD;  Location: [facility];  Service: Pulmonary;;   BRONCHIAL NEEDLE ASPIRATION BIOPSY  11/18/2020   Procedure: BRONCHIAL NEEDLE ASPIRATION BIOPSIES;  Surgeon: Spero Geralds, MD;  Location: [facility];  Service: Pulmonary;;   BRONCHIAL WASHINGS  11/18/2020   Procedure: BRONCHIAL WASHINGS;  Surgeon: Spero Geralds, MD;  Location: [facility];  Service: Pulmonary;;   CATARACT  EXTRACTION, BILATERAL Bilateral 2013   COLONOSCOPY  2005   [facility]   ENDOBRONCHIAL ULTRASOUND N/A 11/18/2020   Procedure: ENDOBRONCHIAL ULTRASOUND;  Surgeon: Spero Geralds, MD;  Location: [facility];  Service: Pulmonary;  Laterality: N/A;   POLYPECTOMY     SKIN CANCER EXCISION Right 1993   under eye-MOHS, freeze multiple places freq   TONSILLECTOMY  as child   TOTAL HIP ARTHROPLASTY Left 01/04/2014   Procedure: LEFT TOTAL HIP ARTHROPLASTY ANTERIOR APPROACH;  Surgeon: Mcarthur Rossetti, MD;  Location: [facility];  Service: Orthopedics;  Laterality: Left;   TOTAL HIP ARTHROPLASTY Left 02/14/2014   Procedure: Irrigation and Debridement left hip;  Surgeon: Mcarthur Rossetti, MD;  Location: [facility];  Service: Orthopedics;  Laterality: Left;    REVIEW OF SYSTEMS:  A comprehensive review of systems was negative except for: Constitutional: positive for fatigue Neurological: positive for paresthesia   PHYSICAL EXAMINATION: General appearance: alert, cooperative, fatigued, and no distress Head: Normocephalic, without obvious abnormality, atraumatic Neck: no adenopathy, no JVD, supple, symmetrical, trachea midline, and thyroid not enlarged, symmetric, no tenderness/mass/nodules Lymph nodes: Cervical, supraclavicular, and axillary nodes normal. Resp: clear to auscultation bilaterally Back: symmetric, no curvature. ROM normal. No CVA tenderness. Cardio: regular rate and rhythm, S1, S2 normal, no murmur, click, rub or gallop GI: soft, non-tender; bowel sounds normal; no masses,  no organomegaly Extremities: extremities normal, atraumatic, no cyanosis or edema  ECOG PERFORMANCE STATUS: 1 - Symptomatic but completely ambulatory  Blood pressure (!) 159/67, pulse 86, temperature 97.7 F (36.5 C), temperature source Tympanic, resp. rate 20, height 6' 0.75" (1.848 m), weight 243 lb 1.6 oz (110.3 kg), SpO2 96 %.  LABORATORY DATA: Lab Results  Component Value Date   WBC 5.2 04/06/2021   HGB 11.6  (L) 04/06/2021   HCT 34.2 (L) 04/06/2021   MCV 97.4 04/06/2021   PLT 153 04/06/2021      Chemistry      Component Value Date/Time   NA 138 04/06/2021 0956   NA 141 01/29/2020 1157   K 4.4 04/06/2021 0956   CL 105 04/06/2021 0956   CO2 26 04/06/2021 0956   BUN 16 04/06/2021 0956   BUN 12 01/29/2020 1157   CREATININE 1.05 04/06/2021 0956   CREATININE 1.04 03/17/2021 1047   CREATININE 1.11 07/26/2016 0921      Component Value Date/Time   CALCIUM 9.2 04/06/2021 0956   ALKPHOS 81 04/06/2021 0956   AST 15 04/06/2021 0956   AST 13 (L) 03/17/2021 1047   ALT 11 04/06/2021 0956   ALT 8 03/17/2021 1047   BILITOT 0.5 04/06/2021 0956   BILITOT 0.5 03/17/2021 1047       RADIOGRAPHIC STUDIES: No results found.  ASSESSMENT AND PLAN: This is a very pleasant 85 years old white male recently diagnosed with Stage IV (T3,  N2, M1c) non-small cell Edwards cancer, squamous cell carcinoma presented with large left upper lobe Edwards mass in addition to left hilar and mediastinal lymphadenopathy as well as bilateral pulmonary nodules and left adrenal gland lesion diagnosed in May 2022. The patient is currently undergoing systemic chemotherapy with carboplatin for AUC of 5, paclitaxel 175 Mg/M2 and Keytruda 200 Mg IV every 3 weeks with Neulasta support status post 4 cycles.  Starting from cycle #5 the patient is on maintenance treatment with Keytruda 200 Mg IV every 3 weeks. He continues to tolerate his treatment fairly well with no concerning adverse effects except for the mild peripheral neuropathy that could have been related to his previous chemotherapy and hopefully will gradually improve over the next few months. I recommended for the patient to proceed with cycle #6 of his treatment today as planned. I will see him back for follow-up visit in 3 weeks for evaluation with repeat CT scan of the chest, abdomen and pelvis for restaging of his disease before starting cycle #7. He was advised to call  immediately if he has any other concerning symptoms in the interval.  All questions were answered. The patient knows to call the clinic with any problems, questions or concerns. We can certainly see the patient much sooner if necessary.  Disclaimer: This note was dictated with voice recognition software. Similar sounding words can inadvertently be transcribed and may not be corrected upon review.

## 2021-04-13 ENCOUNTER — Other Ambulatory Visit: Payer: Self-pay | Admitting: Family Medicine

## 2021-04-13 DIAGNOSIS — I10 Essential (primary) hypertension: Secondary | ICD-10-CM

## 2021-04-14 ENCOUNTER — Ambulatory Visit (INDEPENDENT_AMBULATORY_CARE_PROVIDER_SITE_OTHER): Payer: Medicare Other | Admitting: Family Medicine

## 2021-04-14 ENCOUNTER — Other Ambulatory Visit: Payer: Self-pay

## 2021-04-14 VITALS — BP 138/72 | HR 68 | Temp 97.6°F | Wt 242.8 lb

## 2021-04-14 DIAGNOSIS — I152 Hypertension secondary to endocrine disorders: Secondary | ICD-10-CM | POA: Diagnosis not present

## 2021-04-14 DIAGNOSIS — E1159 Type 2 diabetes mellitus with other circulatory complications: Secondary | ICD-10-CM

## 2021-04-14 DIAGNOSIS — Z23 Encounter for immunization: Secondary | ICD-10-CM | POA: Diagnosis not present

## 2021-04-14 DIAGNOSIS — C3492 Malignant neoplasm of unspecified part of left bronchus or lung: Secondary | ICD-10-CM | POA: Diagnosis not present

## 2021-04-14 MED ORDER — HYDROCHLOROTHIAZIDE 12.5 MG PO CAPS: 12.5000 mg | ORAL_CAPSULE | Freq: Every day | ORAL | 0 refills | Status: DC

## 2021-04-14 NOTE — Progress Notes (Signed)
   Subjective:    Patient ID: Alex Edwards, male    DOB: Aug 14, 1935, 85 y.o.   MRN: [id number]  HPI He is here for a recheck.  He is still having difficulty with peripheral edema.  He is also having some neuropathic symptoms but has discussed this with Dr. Earlie Server who thinks that the symptoms will eventually diminish.  He did stop taking the amlodipine due to the edema.  Continues on losartan and metoprolol.   Review of Systems     Objective:   Physical Exam Alert and in no distress.  Blood pressure readings were evaluated.       Assessment & Plan:  Hypertension associated with diabetes ([place]) - Plan: hydrochlorothiazide (MICROZIDE) 12.5 MG capsule  Need for influenza vaccination - Plan: Flu Vaccine QUAD High Dose(Fluad)  Stage IV squamous cell carcinoma of left lung ([place]) I will add Microzide to his regimen to help with the blood pressure which is really not too bad but also with the edema.  Since he already had losartan I will renew it as a combination when they run out.  He will continue to be followed by Dr. Earlie Server.

## 2021-04-23 NOTE — Progress Notes (Signed)
[facility] OFFICE PROGRESS NOTE  [name], [place] [address]  DIAGNOSIS: Stage IV (T3, N2, M1c) non-small cell lung cancer, squamous cell carcinoma presented with large left upper lobe lung mass in addition to left hilar and mediastinal lymphadenopathy as well as bilateral pulmonary nodules and left adrenal gland lesion diagnosed in May 2022.  PRIOR THERAPY: None  CURRENT THERAPY: Palliative systemic chemotherapy with carboplatin for an AUC of 5, paclitaxel 175 mg/m2 and Keytruda 200 mg IV every 3 weeks.  First dose expected on 12/22/2020. Status post 6 cycles.  Starting from cycle #5 the patient will be on maintenance treatment with single agent Keytruda 200 Mg IV every 3 weeks.  INTERVAL HISTORY: Alex Edwards 85 y.o. male returns to the clinic today for a follow-up visit accompanied by his wife.  He feels well overall.  Denies any fever or chills.  He denies any night sweats or weight loss. He notes that his cough has been improved lately.  He states that he is not nearly coughing as much.  He states that his breathing/shortness of breath is "pretty good".  He has not needed to use his supplemental oxygen lately.  He denies any chest pain or hemoptysis.  He denies any nausea, vomiting, diarrhea, or constipation.  He denies any headache or visual changes besides his baseline visual changes from macular degeneration. He denies any rashes has dry skin and underlying psoriasis in addition to dry skin from his immunotherapy.   His PCP discontinued his Norvasc due to lower extremity edema and he is currently on HCTZ.   He has some peripheral neuropathy but is not interested in starting gabapentin at this time.  He is supposed have a restaging CT scan but it is not scheduled till tomorrow.  He is here for evaluation before starting cycle #7.     MEDICAL HISTORY: Past Medical History:  Diagnosis Date   BCE (basal cell epithelioma)    Bruises easily     on hands   Cough    smokers cough or perfurmes   Degeneration macular    right eye   Diabetes mellitus without complication (HCC)    Dyslipidemia    ED (erectile dysfunction)    Hx of adenomatous colonic polyps    Hyperlipidemia    Hypertension    borderline   Obesity    Psoriasis    Seasonal allergies    Smoker    former    ALLERGIES:  is allergic to paclitaxel, vancomycin, amlodipine, and latex.  MEDICATIONS:  Current Outpatient Medications  Medication Sig Dispense Refill   Ascorbic Acid (VITAMIN C) 1000 MG tablet Take 1,000 mg by mouth daily.     fexofenadine (ALLEGRA) 180 MG tablet Take 180 mg by mouth daily.     hydrochlorothiazide (MICROZIDE) 12.5 MG capsule Take 1 capsule (12.5 mg total) by mouth daily. 90 capsule 0   loratadine (CLARITIN) 10 MG tablet Take 10 mg by mouth daily.     losartan (COZAAR) 100 MG tablet Take 1 tablet (100 mg total) by mouth daily. 90 tablet 1   metoprolol tartrate (LOPRESSOR) 50 MG tablet TAKE ONE TABLET BY MOUTH TWICE DAILY. 180 tablet 0   Multiple Vitamin (MULTI VITAMIN DAILY PO) Take 1 tablet by mouth daily.     Multiple Vitamins-Minerals (PRESERVISION AREDS 2) CAPS Take 1 capsule by mouth in the morning and at bedtime.     [place] Patient is to test  one time a day DX: E11.9 100 each 4   ONETOUCH VERIO test strip USE TO TEST BLOOD SUGAR ONCE DAILY. 50 strip 2   prochlorperazine (COMPAZINE) 10 MG tablet Take 1 tablet (10 mg total) by mouth every 6 (six) hours as needed for nausea or vomiting. 30 tablet 0   tamsulosin (FLOMAX) 0.4 MG CAPS capsule Take 1 capsule (0.4 mg total) by mouth daily after supper. 30 capsule 3   triamcinolone cream (KENALOG) 0.1 % APPLY TO AFFECTED AREA TWICE A DAY. 454 g 0   No current facility-administered medications for this visit.    SURGICAL HISTORY:  Past Surgical History:  Procedure Laterality Date   BRONCHIAL BRUSHINGS  11/18/2020   Procedure: BRONCHIAL BRUSHINGS;  Surgeon:  Spero Geralds, MD;  Location: [facility];  Service: Pulmonary;;   BRONCHIAL NEEDLE ASPIRATION BIOPSY  11/18/2020   Procedure: BRONCHIAL NEEDLE ASPIRATION BIOPSIES;  Surgeon: Spero Geralds, MD;  Location: [facility];  Service: Pulmonary;;   BRONCHIAL WASHINGS  11/18/2020   Procedure: BRONCHIAL WASHINGS;  Surgeon: Spero Geralds, MD;  Location: [facility];  Service: Pulmonary;;   CATARACT EXTRACTION, BILATERAL Bilateral 2013   COLONOSCOPY  2005   [facility]   ENDOBRONCHIAL ULTRASOUND N/A 11/18/2020   Procedure: ENDOBRONCHIAL ULTRASOUND;  Surgeon: Spero Geralds, MD;  Location: [facility];  Service: Pulmonary;  Laterality: N/A;   POLYPECTOMY     SKIN CANCER EXCISION Right 1993   under eye-MOHS, freeze multiple places freq   TONSILLECTOMY  as child   TOTAL HIP ARTHROPLASTY Left 01/04/2014   Procedure: LEFT TOTAL HIP ARTHROPLASTY ANTERIOR APPROACH;  Surgeon: Mcarthur Rossetti, MD;  Location: [facility];  Service: Orthopedics;  Laterality: Left;   TOTAL HIP ARTHROPLASTY Left 02/14/2014   Procedure: Irrigation and Debridement left hip;  Surgeon: Mcarthur Rossetti, MD;  Location: [facility];  Service: Orthopedics;  Laterality: Left;    REVIEW OF SYSTEMS:   Review of Systems  Constitutional: Negative for appetite change, chills, fatigue, fever and unexpected weight change.  HENT: Negative for mouth sores, nosebleeds, sore throat and trouble swallowing.   Eyes: Negative for eye problems and icterus.  Respiratory: Positive for improved cough.  Improved shortness of breath.  Negative for hemoptysis and wheezing.   Cardiovascular: Negative for chest pain and leg swelling.  Gastrointestinal: Negative for abdominal pain, constipation, diarrhea, nausea and vomiting.  Genitourinary: Negative for bladder incontinence, difficulty urinating, dysuria, frequency and hematuria.   Musculoskeletal: Negative for back pain, gait problem, neck pain and neck stiffness.  Skin: Positive for dry  skin. Neurological: Negative for dizziness, extremity weakness, gait problem, headaches, light-headedness and seizures.  Hematological: Negative for adenopathy. Does not bruise/bleed easily.  Psychiatric/Behavioral: Negative for confusion, depression and sleep disturbance. The patient is not nervous/anxious.     PHYSICAL EXAMINATION:  Blood pressure (!) 144/57, pulse 81, temperature (!) 97.3 F (36.3 C), resp. rate 18, weight 241 lb 5 oz (109.5 kg), SpO2 94 %.  ECOG PERFORMANCE STATUS: 1  Physical Exam  Constitutional: Oriented to person, place, and time and well-developed, well-nourished, and in no distress.  HENT:  Head: Normocephalic and atraumatic.  Mouth/Throat: Oropharynx is clear and moist. No oropharyngeal exudate.  Eyes: Conjunctivae are normal. Right eye exhibits no discharge. Left eye exhibits no discharge. No scleral icterus.  Neck: Normal range of motion. Neck supple.  Cardiovascular: Normal rate, regular rhythm, normal heart sounds and intact distal pulses.   Pulmonary/Chest: Effort normal and breath sounds normal. No respiratory distress. No wheezes. No rales.  Abdominal: Soft. Bowel sounds are normal. Exhibits no distension and no mass. There is no tenderness.  Musculoskeletal: Normal range of motion. Exhibits no edema.  Lymphadenopathy:    No cervical adenopathy.  Neurological: Alert and oriented to person, place, and time. Exhibits normal muscle tone. Gait normal. Coordination normal.  Skin: Skin is warm and dry.  Positive for dry skin.  Not diaphoretic. No erythema. No pallor.  Psychiatric: Mood, memory and judgment normal.  Vitals reviewed.  LABORATORY DATA: Lab Results  Component Value Date   WBC 5.1 04/27/2021   HGB 12.5 (L) 04/27/2021   HCT 35.8 (L) 04/27/2021   MCV 95.5 04/27/2021   PLT 167 04/27/2021      Chemistry      Component Value Date/Time   NA 141 04/27/2021 0957   NA 141 01/29/2020 1157   K 4.6 04/27/2021 0957   CL 105 04/27/2021 0957    CO2 27 04/27/2021 0957   BUN 29 (H) 04/27/2021 0957   BUN 12 01/29/2020 1157   CREATININE 1.26 (H) 04/27/2021 0957   CREATININE 1.04 03/17/2021 1047   CREATININE 1.11 07/26/2016 0921      Component Value Date/Time   CALCIUM 9.2 04/27/2021 0957   ALKPHOS 78 04/27/2021 0957   AST 15 04/27/2021 0957   AST 13 (L) 03/17/2021 1047   ALT 13 04/27/2021 0957   ALT 8 03/17/2021 1047   BILITOT 0.6 04/27/2021 0957   BILITOT 0.5 03/17/2021 1047       RADIOGRAPHIC STUDIES:  No results found.   ASSESSMENT/PLAN:  This is a very pleasant 85 year old Caucasian male diagnosed with stage IV (T3, N2, M1 C) non-small cell lung cancer, squamous cell carcinoma.  He presented with a large left upper lobe lung mass in addition to left hilar mediastinal lymphadenopathy, scattered bilateral pulmonary nodules, and a small lesion in the left adrenal gland suspicious for metastatic disease.  The patient was diagnosed in May 2022.   The patient is currently undergoing palliative systemic chemotherapy with carboplatin for an AUC of 5, paclitaxel 175 mg per metered squared, Keytruda 200 mg IV every 3 weeks with Neulasta support.  He is status post 6 cycle and tolerated it well.  Starting from cycle #5, the patient has been on single agent immunotherapy with Keytruda.   The patient recently had a restaging CT scan performed.  Dr. Julien Nordmann personally independently reviewed the scan discussed results with the patient today.  The scan showed the patient's restaging CT scan is not scheduled until tomorrow.  Labs were reviewed.  Recommend that he proceed with cycle #7 today as scheduled.  Encouraged him to increase his hydration as it appears the patient is a little bit dehydrated.  Recommend that he continue with cycle #7 today scheduled.  We will see the patient back for follow-up visit in 3 weeks for evaluation before starting cycle #8.  If there is anything concerning on his upcoming scan, we will call the patient to  be evaluated sooner.  I will call him by the end of the week the scan looks stable and we will review it in more detail at his next appointment.  Regarding the peripheral neuropathy, the patient has history of chemotherapy with Taxol as well as diabetes.  I offered to start him on gabapentin but the patient would rather wait until he sees his primary care provider to discuss starting this.  He will continue to use hydrocortisone cream as needed for the dry skin/itching.  He was advised to use  lotion as well.  The patient was advised to call immediately if he has any concerning symptoms in the interval. The patient voices understanding of current disease status and treatment options and is in agreement with the current care plan. All questions were answered. The patient knows to call the clinic with any problems, questions or concerns. We can certainly see the patient much sooner if necessary       No orders of the defined types were placed in this encounter.   The total time spent in the appointment was 20-29 minutes.   Ciarrah Rae L Fender Herder, PA-C 04/27/21

## 2021-04-24 DIAGNOSIS — J9601 Acute respiratory failure with hypoxia: Secondary | ICD-10-CM | POA: Diagnosis not present

## 2021-04-24 DIAGNOSIS — C3492 Malignant neoplasm of unspecified part of left bronchus or lung: Secondary | ICD-10-CM | POA: Diagnosis not present

## 2021-04-27 ENCOUNTER — Inpatient Hospital Stay (HOSPITAL_BASED_OUTPATIENT_CLINIC_OR_DEPARTMENT_OTHER): Payer: Medicare Other | Admitting: Physician Assistant

## 2021-04-27 ENCOUNTER — Inpatient Hospital Stay: Payer: Medicare Other

## 2021-04-27 ENCOUNTER — Other Ambulatory Visit: Payer: Self-pay

## 2021-04-27 ENCOUNTER — Other Ambulatory Visit: Payer: Self-pay | Admitting: Family Medicine

## 2021-04-27 VITALS — BP 144/57 | HR 81 | Temp 97.3°F | Resp 18 | Wt 241.3 lb

## 2021-04-27 DIAGNOSIS — Z5111 Encounter for antineoplastic chemotherapy: Secondary | ICD-10-CM

## 2021-04-27 DIAGNOSIS — Z79899 Other long term (current) drug therapy: Secondary | ICD-10-CM | POA: Diagnosis not present

## 2021-04-27 DIAGNOSIS — C3492 Malignant neoplasm of unspecified part of left bronchus or lung: Secondary | ICD-10-CM

## 2021-04-27 DIAGNOSIS — Z5112 Encounter for antineoplastic immunotherapy: Secondary | ICD-10-CM

## 2021-04-27 DIAGNOSIS — I10 Essential (primary) hypertension: Secondary | ICD-10-CM | POA: Diagnosis not present

## 2021-04-27 DIAGNOSIS — Z87891 Personal history of nicotine dependence: Secondary | ICD-10-CM | POA: Diagnosis not present

## 2021-04-27 DIAGNOSIS — E119 Type 2 diabetes mellitus without complications: Secondary | ICD-10-CM | POA: Diagnosis not present

## 2021-04-27 DIAGNOSIS — G629 Polyneuropathy, unspecified: Secondary | ICD-10-CM | POA: Diagnosis not present

## 2021-04-27 DIAGNOSIS — C3412 Malignant neoplasm of upper lobe, left bronchus or lung: Secondary | ICD-10-CM | POA: Diagnosis not present

## 2021-04-27 DIAGNOSIS — E785 Hyperlipidemia, unspecified: Secondary | ICD-10-CM | POA: Diagnosis not present

## 2021-04-27 DIAGNOSIS — C3490 Malignant neoplasm of unspecified part of unspecified bronchus or lung: Secondary | ICD-10-CM

## 2021-04-27 DIAGNOSIS — R32 Unspecified urinary incontinence: Secondary | ICD-10-CM

## 2021-04-27 LAB — COMPREHENSIVE METABOLIC PANEL
ALT: 13 U/L (ref 0–44)
AST: 15 U/L (ref 15–41)
Albumin: 4 g/dL (ref 3.5–5.0)
Alkaline Phosphatase: 78 U/L (ref 38–126)
Anion gap: 9 (ref 5–15)
BUN: 29 mg/dL — ABNORMAL HIGH (ref 8–23)
CO2: 27 mmol/L (ref 22–32)
Calcium: 9.2 mg/dL (ref 8.9–10.3)
Chloride: 105 mmol/L (ref 98–111)
Creatinine, Ser: 1.26 mg/dL — ABNORMAL HIGH (ref 0.61–1.24)
GFR, Estimated: 57 mL/min — ABNORMAL LOW (ref >60)
Glucose, Bld: 136 mg/dL — ABNORMAL HIGH (ref 70–99)
Potassium: 4.6 mmol/L (ref 3.5–5.1)
Sodium: 141 mmol/L (ref 135–145)
Total Bilirubin: 0.6 mg/dL (ref 0.3–1.2)
Total Protein: 7.2 g/dL (ref 6.5–8.1)

## 2021-04-27 LAB — CBC WITH DIFFERENTIAL (CANCER CENTER ONLY)
Abs Immature Granulocytes: 0.03 10*3/uL (ref 0.00–0.07)
Basophils Absolute: 0 10*3/uL (ref 0.0–0.1)
Basophils Relative: 1 %
Eosinophils Absolute: 0.2 10*3/uL (ref 0.0–0.5)
Eosinophils Relative: 4 %
HCT: 35.8 % — ABNORMAL LOW (ref 39.0–52.0)
Hemoglobin: 12.5 g/dL — ABNORMAL LOW (ref 13.0–17.0)
Immature Granulocytes: 1 %
Lymphocytes Relative: 28 %
Lymphs Abs: 1.4 10*3/uL (ref 0.7–4.0)
MCH: 33.3 pg (ref 26.0–34.0)
MCHC: 34.9 g/dL (ref 30.0–36.0)
MCV: 95.5 fL (ref 80.0–100.0)
Monocytes Absolute: 0.5 10*3/uL (ref 0.1–1.0)
Monocytes Relative: 10 %
Neutro Abs: 2.9 10*3/uL (ref 1.7–7.7)
Neutrophils Relative %: 56 %
Platelet Count: 167 10*3/uL (ref 150–400)
RBC: 3.75 MIL/uL — ABNORMAL LOW (ref 4.22–5.81)
RDW: 11.9 % (ref 11.5–15.5)
WBC Count: 5.1 10*3/uL (ref 4.0–10.5)
nRBC: 0 % (ref 0.0–0.2)

## 2021-04-27 LAB — TSH: TSH: 5.002 u[IU]/mL — ABNORMAL HIGH (ref 0.320–4.118)

## 2021-04-27 MED ORDER — SODIUM CHLORIDE 0.9 % IV SOLN
200.0000 mg | Freq: Once | INTRAVENOUS | Status: AC
  Administered 2021-04-27: 200 mg via INTRAVENOUS
  Filled 2021-04-27: qty 8

## 2021-04-27 MED ORDER — SODIUM CHLORIDE 0.9 % IV SOLN: Freq: Once | INTRAVENOUS | Status: AC

## 2021-04-27 NOTE — Patient Instructions (Signed)
[facility] ONCOLOGY   Discharge Instructions: Thank you for choosing [facility] to provide your oncology and hematology care.   If you have a lab appointment with the [facility], please go directly to the [facility] and check in at the registration area.   Wear comfortable clothing and clothing appropriate for easy access to any Portacath or PICC line.   We strive to give you quality time with your provider. You may need to reschedule your appointment if you arrive late (15 or more minutes).  Arriving late affects you and other patients whose appointments are after yours.  Also, if you miss three or more appointments without notifying the office, you may be dismissed from the clinic at the provider's discretion.      For prescription refill requests, have your pharmacy contact our office and allow 72 hours for refills to be completed.    Today you received the following chemotherapy and/or immunotherapy agents: pembrolizumab.      To help prevent nausea and vomiting after your treatment, we encourage you to take your nausea medication as directed.  BELOW ARE SYMPTOMS THAT SHOULD BE REPORTED IMMEDIATELY: *FEVER GREATER THAN 100.4 F (38 C) OR HIGHER *CHILLS OR SWEATING *NAUSEA AND VOMITING THAT IS NOT CONTROLLED WITH YOUR NAUSEA MEDICATION *UNUSUAL SHORTNESS OF BREATH *UNUSUAL BRUISING OR BLEEDING *URINARY PROBLEMS (pain or burning when urinating, or frequent urination) *BOWEL PROBLEMS (unusual diarrhea, constipation, pain near the anus) TENDERNESS IN MOUTH AND THROAT WITH OR WITHOUT PRESENCE OF ULCERS (sore throat, sores in mouth, or a toothache) UNUSUAL RASH, SWELLING OR PAIN  UNUSUAL VAGINAL DISCHARGE OR ITCHING   Items with * indicate a potential emergency and should be followed up as soon as possible or go to the Emergency Department if any problems should occur.  Please show the CHEMOTHERAPY ALERT CARD or IMMUNOTHERAPY ALERT CARD at  check-in to the Emergency Department and triage nurse.  Should you have questions after your visit or need to cancel or reschedule your appointment, please contact [facility]  Dept: [phone]  and follow the prompts.  Office hours are 8:00 a.m. to 4:30 p.m. Monday - Friday. Please note that voicemails left after 4:00 p.m. may not be returned until the following business day.  We are closed weekends and major holidays. You have access to a nurse at all times for urgent questions. Please call the main number to the clinic Dept: [phone] and follow the prompts.   For any non-urgent questions, you may also contact your provider using MyChart. We now offer e-Visits for anyone 21 and older to request care online for non-urgent symptoms. For details visit [email or web address].   Also download the MyChart app! Go to the app store, search "MyChart", open the app, select [facility], and log in with your MyChart username and password.  Due to Covid, a mask is required upon entering the hospital/clinic. If you do not have a mask, one will be given to you upon arrival. For doctor visits, patients may have 1 support person aged 62 or older with them. For treatment visits, patients cannot have anyone with them due to current Covid guidelines and our immunocompromised population.

## 2021-04-28 ENCOUNTER — Ambulatory Visit (HOSPITAL_COMMUNITY)
Admission: RE | Admit: 2021-04-28 | Discharge: 2021-04-28 | Disposition: A | Discharge status: Home / Self Care | Payer: Medicare Other | Source: Ambulatory Visit | Attending: Internal Medicine | Admitting: Internal Medicine

## 2021-04-28 DIAGNOSIS — I700 Atherosclerosis of aorta: Secondary | ICD-10-CM | POA: Diagnosis not present

## 2021-04-28 DIAGNOSIS — K5730 Diverticulosis of large intestine without perforation or abscess without bleeding: Secondary | ICD-10-CM | POA: Diagnosis not present

## 2021-04-28 DIAGNOSIS — N210 Calculus in bladder: Secondary | ICD-10-CM | POA: Diagnosis not present

## 2021-04-28 DIAGNOSIS — R911 Solitary pulmonary nodule: Secondary | ICD-10-CM | POA: Diagnosis not present

## 2021-04-28 DIAGNOSIS — J439 Emphysema, unspecified: Secondary | ICD-10-CM | POA: Diagnosis not present

## 2021-04-28 DIAGNOSIS — C3490 Malignant neoplasm of unspecified part of unspecified bronchus or lung: Secondary | ICD-10-CM | POA: Diagnosis not present

## 2021-04-28 DIAGNOSIS — K4020 Bilateral inguinal hernia, without obstruction or gangrene, not specified as recurrent: Secondary | ICD-10-CM | POA: Diagnosis not present

## 2021-04-28 MED ORDER — IOHEXOL 350 MG/ML SOLN
80.0000 mL | Freq: Once | INTRAVENOUS | Status: AC | PRN
  Administered 2021-04-28: 80 mL via INTRAVENOUS

## 2021-04-29 ENCOUNTER — Telehealth: Payer: Self-pay | Admitting: Physician Assistant

## 2021-04-29 NOTE — Telephone Encounter (Signed)
Scheduled per 10/31 los, patient has been called and notified.

## 2021-04-29 NOTE — Telephone Encounter (Signed)
The patient recently had a restaging CT scan performed but his follow-up appointment is not scheduled for a couple weeks.  I called the patient to let them know that the scan did not show any evidence of disease progression.  We will discuss the results in more detail at his next appointment.  He expressed understanding with the results was appreciative for the call.

## 2021-05-07 ENCOUNTER — Telehealth: Payer: Self-pay | Admitting: Family Medicine

## 2021-05-07 NOTE — Progress Notes (Signed)
  Chronic Care Management   Note  05/07/2021 Name: Alex Edwards MRN: [id number] DOB: 12-02-1935  Alex Edwards is a 85 y.o. year old male who is a primary care patient of [employer], Elyse Jarvis, MD. I reached out to [place] by phone today in response to a referral sent by Alex Edwards PCP, [name], MD.   [name] was given information about Chronic Care Management services today including:  CCM service includes personalized support from designated clinical staff supervised by his physician, including individualized plan of care and coordination with other care providers 24/7 contact phone numbers for assistance for urgent and routine care needs. Service will only be billed when office clinical staff spend 20 minutes or more in a month to coordinate care. Only one practitioner may furnish and bill the service in a calendar month. The patient may stop CCM services at any time (effective at the end of the month) by phone call to the office staff.   Patient agreed to services and verbal consent obtained.   Follow up plan:   Tatjana Secretary/administrator

## 2021-05-07 NOTE — Chronic Care Management (AMB) (Signed)
  Chronic Care Management   Outreach Note  05/07/2021 Name: Alex Edwards MRN: [id number] DOB: 1935-09-24  Referred by: [name], MD Reason for referral : No chief complaint on file.   An unsuccessful telephone outreach was attempted today. The patient was referred to the pharmacist for assistance with care management and care coordination.   Follow Up Plan:   Tatjana Dellinger Upstream Scheduler

## 2021-05-08 ENCOUNTER — Telehealth: Payer: Self-pay | Admitting: Pharmacist

## 2021-05-08 NOTE — Chronic Care Management (AMB) (Signed)
A user error has taken place: encounter opened in error, closed for administrative reasons.

## 2021-05-08 NOTE — Chronic Care Management (AMB) (Signed)
Chronic Care Management Pharmacy Assistant   Name: Alex Edwards  MRN: [id number] DOB: June 23, 1936   Reason for Encounter: Chart review for initial encounter with Alex Edwards Clinical Pharmacist on 05/14/21 at 11 am via phone call.   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 Prescribed HCTZ 12.5 mg daily  01/29/21 Alex Lung, MD - Patient presented for Routine general medical exam and other concerns. No medication changes.  12/02/2020 Alex Lung, MD - Patient presented for Malignant neoplasm of left Edwards and other concerns. No medication changes.   Recent consult visits:  04/27/21 [name], [name], PA-C (Oncology) - Patient presented for Stage IV squamous cell carcinoma of left Edwards and other concerns. No medication changes.  04/06/21 Patient presented to [name] for an infusion.  04/06/21 Alex Bears, MD(Oncology) - Patient presented for Malignant neoplasm of unspecified part of unspecified ronchus or Edwards and other concerns. No medication changes.  03/17/2021 Alex Bears, MD (Oncology)- Patient presented for  Stage IV squamous cell carcinoma of left Edwards and other concerns.No medication changes  02/23/21 Alex Bears, MD (Oncology) - Patient presented for  Stage IV squamous cell carcinoma of left Edwards and other concerns. No medication changes  02/02/21 Alex Bears, MD (Oncology) - Patient presented for  Stage IV squamous cell carcinoma of left Edwards and other concerns. Stopped Ketoconazole 2%.  01/12/21 Alex Bears, MD (Oncology) - Patient presented for  Stage IV squamous cell carcinoma of left Edwards and other concerns. Stopped Ketoconazole 2%.  12/30/20 [name], [name], PA-C (Oncology) - Patient presented  for NSCLC of left Edwards. No medication changes.  12/23/20 Patient presented to [facility] for LE Venous DVT  12/22/20 [name], [name], PA-C (Oncology) - Patient presented for NSCLC of left Edwards and other concerns. No medication changes.  12/15/20 [name], [name], PA-C (Oncology) - Patient presented for NSCLC of left Edwards and other concerns. Stopped Amlodipine Besylate 5 mg.  12/11/20 Alex Pray, MD (Radiology Onc) - Patient presented to [facility] for Nurse Eval.  12/04/20 Alex Bears, MD (Oncology) - Patient presented for NSCLC of left Edwards and other concerns. Prescribed Prochlorperazine Maleate 10 mg every 6 hours PRN.  11/20/20 Alex Pita MD (Rad Onc) - Patient presented to [facility] visits:  Medication Reconciliation was completed by comparing discharge summary, patient's EMR and Pharmacy list, and upon discussion with patient.  Patient presented to [facility] on 11/17/20 due to Edwards Mass. Patient was present for 5 days.  New?Medications Started at [facility] Discharge:?? -started  amLODipine (NORVASC) amoxicillin-clavulanate (Augmentin) tamsulosin (Flomax)  Medication Changes at Hospital Discharge: -Changed  losartan (Cozaar)  Medications Discontinued at Hospital Discharge: -Stopped  atorvastatin 10 MG tablet (Lipitor) cephALEXin 500 MG capsule (KEFLEX  Medications that remain the same after Hospital Discharge:??  -All other medications will remain the same.    Medications: Outpatient Encounter Medications as of 05/08/2021  Medication Sig   Ascorbic Acid (VITAMIN C) 1000 MG tablet Take 1,000 mg by mouth daily.   fexofenadine (ALLEGRA) 180 MG tablet Take 180 mg by mouth daily.   hydrochlorothiazide (MICROZIDE) 12.5 MG capsule Take 1 capsule (12.5 mg total) by mouth daily.   loratadine (CLARITIN) 10 MG tablet Take 10 mg by  mouth daily.   losartan (COZAAR)  100 MG tablet Take 1 tablet (100 mg total) by mouth daily.   metoprolol tartrate (LOPRESSOR) 50 MG tablet TAKE ONE TABLET BY MOUTH TWICE DAILY.   Multiple Vitamin (MULTI VITAMIN DAILY PO) Take 1 tablet by mouth daily.   Multiple Vitamins-Minerals (PRESERVISION AREDS 2) CAPS Take 1 capsule by mouth in the morning and at bedtime.   [place] Patient is to test one time a day DX: E11.9   ONETOUCH VERIO test strip USE TO TEST BLOOD SUGAR ONCE DAILY.   prochlorperazine (COMPAZINE) 10 MG tablet Take 1 tablet (10 mg total) by mouth every 6 (six) hours as needed for nausea or vomiting.   tamsulosin (FLOMAX) 0.4 MG CAPS capsule Take 1 capsule (0.4 mg total) by mouth daily after supper.   triamcinolone cream (KENALOG) 0.1 % APPLY TO AFFECTED AREA TWICE A DAY.   No facility-administered encounter medications on file as of 05/08/2021.  Fill History Gaps :   hydrochlorothiazide 12.5 mg capsule 04/14/2021 90   losartan 100 mg tablet 04/13/2021 90   metoprolol tartrate 50 mg tablet 04/13/2021 90   prochlorperazine maleate 10 mg tablet 12/04/2020 8   tamsulosin 0.4 mg capsule 04/27/2021 30   triamcinolone acetonide 0.1 % topical cream 10/14/2020 60   OneTouch Verio test strips 10/22/2020 50    Have you seen any other providers since your last visit? Patient reports other than his oncology providers no.  Any changes in your medications or health? Patient reports he has finished his chemo is on [place] every 3 weeks.  Any side effects from any medications? Patient reports he has only noticed side effects from the shots he was having after chemo. They took about a week to recover from.  Do you have an symptoms or problems not managed by your medications? Patient reports that he has numbness in both feet and right hand, he also has leg and ankle edema, he notes he was prescribed a diuretic that he does not feel is helping.  Any concerns about your health right now? Patient  notes the above  Has your provider asked that you check blood pressure, blood sugar, or follow special diet at home? Patient reports he has it checks often with his appointments has cuff but will only use when he is feeing bad does not regularly check.  Can you think of a goal you would like to reach for your health? Patient reports not at this time.  Do you have any problems getting your medications? Patient reports he has no financial barriers and is happy with [facility] been using them for several years.  Is there anything that you would like to discuss during the appointment? Patient reports he would like to know more about the benefits of this service, advised him that we are another resource to him as the office can be busy at times, he reports he has not heard about this service from his provider and usually discusses matters with him. Advised him if he had any questions do not hesitate to ask during his appointment or give me a call.  Patient assistance for the following mediations: None  Patient aware to have, medications available for call and a reminder call prior.  Care Gaps: BP - 138/72 (10/18) Eye Exam - Overdue [employer] - Overdue COVID Booster #5 [employer]) - Overdue AWV- 8/22 Lab Results  Component Value Date   HGBA1C 6.4 (A) 01/29/2021    Star Rating Drugs:  Losartan (Cozaar) 100 mg - Last filled 04/13/21 90 DS at [facility] Pharmacist Assistant [phone]

## 2021-05-14 ENCOUNTER — Ambulatory Visit (INDEPENDENT_AMBULATORY_CARE_PROVIDER_SITE_OTHER): Payer: Medicare Other | Admitting: Pharmacist

## 2021-05-14 DIAGNOSIS — E785 Hyperlipidemia, unspecified: Secondary | ICD-10-CM

## 2021-05-14 DIAGNOSIS — E119 Type 2 diabetes mellitus without complications: Secondary | ICD-10-CM

## 2021-05-14 NOTE — Progress Notes (Signed)
Chronic Care Management Pharmacy Note  05/14/2021 Name:  Alex Edwards MRN:  [id number] DOB:  1936-05-16  Summary: LDL not at goal < 70 BP not ideally at goal < 140/90  Recommendations/Changes made from today's visit: -Recommend repeat lipid panel and consider PCSK9 therapy -Recommended moving losartan to the evening to spread out BP lowering -Recommended switching vitamin D to every other day to prevent overdose -Recommend vitamin B12 level with neuropathy  Plan: BP assessment in 2-3 months   Subjective: Alex Edwards is an 85 y.o. year old male who is a primary patient of [name], MD.  The CCM team was consulted for assistance with disease management and care coordination needs.    Engaged with patient by telephone for initial visit in response to provider referral for pharmacy case management and/or care coordination services.   Consent to Services:  The patient was given the following information about Chronic Care Management services today, agreed to services, and gave verbal consent: 1. CCM service includes personalized support from designated clinical staff supervised by the primary care provider, including individualized plan of care and coordination with other care providers 2. 24/7 contact phone numbers for assistance for urgent and routine care needs. 3. Service will only be billed when office clinical staff spend 20 minutes or more in a month to coordinate care. 4. Only one practitioner may furnish and bill the service in a calendar month. 5.The patient may stop CCM services at any time (effective at the end of the month) by phone call to the office staff. 6. The patient will be responsible for cost sharing (co-pay) of up to 20% of the service fee (after annual deductible is met). Patient agreed to services and consent obtained.  Patient Care Team: [name], MD as PCP - General (Family Medicine) Valrie Hart, RN as Oncology Nurse Navigator  (Oncology) Viona Gilmore, [facility] as Pharmacist (Pharmacist)  Recent office visits: 04/14/21 [name], MD - Patient presented for Hypertension associated with diabetes and other concerns. Prescribed HCTZ 12.5 mg daily   01/29/21 [name], MD - Patient presented for Routine general medical exam and other concerns. No medication changes.   12/02/2020 [name], MD - Patient presented for Malignant neoplasm of left lung and other concerns. No medication changes.   Recent consult visits: 04/27/21 Edwards, Alex Edwards (Oncology) - Patient presented for Stage IV squamous cell carcinoma of left lung and other concerns. No medication changes.   04/06/21 Patient presented to [facility] for an infusion.   04/06/21 Alex Bears, MD(Oncology) - Patient presented for Malignant neoplasm of unspecified part of unspecified ronchus or lung and other concerns. No medication changes.   03/17/2021 Alex Bears, MD (Oncology)- Patient presented for  Stage IV squamous cell carcinoma of left lung and other concerns.No medication changes   02/23/21 Alex Bears, MD (Oncology) - Patient presented for  Stage IV squamous cell carcinoma of left lung and other concerns. No medication changes   02/02/21 Alex Bears, MD (Oncology) - Patient presented for  Stage IV squamous cell carcinoma of left lung and other concerns. Stopped Ketoconazole 2%.   01/12/21 Alex Bears, MD (Oncology) - Patient presented for  Stage IV squamous cell carcinoma of left lung and other concerns. Stopped Ketoconazole 2%.   12/30/20 Edwards, Alex Edwards (Oncology) - Patient presented for NSCLC of left lung. No medication changes.   12/23/20 Patient presented to [facility] for LE Venous DVT   12/22/20 Edwards, Alex Edwards (Oncology) - Patient presented for NSCLC of left lung and other concerns. No medication changes.   12/15/20  Edwards, Alex Edwards (Oncology) - Patient presented for NSCLC of left lung and other concerns. Stopped Amlodipine Besylate 5 mg.   12/11/20 Alex Pray, MD (Radiology Onc) - Patient presented to [facility] for Nurse Eval.   12/04/20 Alex Bears, MD (Oncology) - Patient presented for NSCLC of left lung and other concerns. Prescribed Prochlorperazine Maleate 10 mg every 6 hours PRN.   11/20/20 Alex Pita MD (Rad Onc) - Patient presented to [facility] for [facility] visits: Medication Reconciliation was completed by comparing discharge summary, patient's EMR and Pharmacy list, and upon discussion with patient.   Patient presented to [facility] on 11/17/20 due to Lung Mass. Patient was present for 5 days.   New?Medications Started at [facility] Discharge:?? -started  amLODipine (NORVASC) amoxicillin-clavulanate (Augmentin) tamsulosin (Flomax)   Medication Changes at Hospital Discharge: -Changed  losartan (Cozaar)   Medications Discontinued at Hospital Discharge: -Stopped  atorvastatin 10 MG tablet (Lipitor) cephALEXin 500 MG capsule (KEFLEX   Medications that remain the same after Hospital Discharge:??  -All other medications will remain the same.       Objective:  Lab Results  Component Value Date   CREATININE 1.26 (H) 04/27/2021   BUN 29 (H) 04/27/2021   GFRNONAA 57 (L) 04/27/2021   GFRAA 84 01/29/2020   NA 141 04/27/2021   K 4.6 04/27/2021   CALCIUM 9.2 04/27/2021   CO2 27 04/27/2021   GLUCOSE 136 (H) 04/27/2021    Lab Results  Component Value Date/Time   HGBA1C 6.4 (A) 01/29/2021 12:14 PM   HGBA1C 6.4 (H) 11/18/2020 04:43 AM   HGBA1C 6.8 (A) 01/29/2020 11:51 AM   HGBA1C 6.7 (H) 07/26/2016 09:21 AM   MICROALBUR 51.8 01/29/2020 02:26 PM   MICROALBUR 53.6 01/25/2019 12:15 PM    Last diabetic Eye exam: No results found for: HMDIABEYEEXA  Last diabetic Foot exam: No results found for:  HMDIABFOOTEX   Lab Results  Component Value Date   CHOL 187 01/29/2020   HDL 34 (L) 01/29/2020   LDLCALC 122 (H) 01/29/2020   TRIG 174 (H) 01/29/2020   CHOLHDL 5.5 (H) 01/29/2020    Hepatic Function Latest Ref Rng & Units 04/27/2021 04/06/2021 03/17/2021  Total Protein 6.5 - 8.1 g/dL 7.2 6.8 6.6  Albumin 3.5 - 5.0 g/dL 4.0 3.7 3.6  AST 15 - 41 U/L 15 15 13(L)  ALT 0 - 44 U/L 13 11 8   Alk Phosphatase 38 - 126 U/L 78 81 84  Total Bilirubin 0.3 - 1.2 mg/dL 0.6 0.5 0.5    Lab Results  Component Value Date/Time   TSH 5.002 (H) 04/27/2021 09:57 AM   TSH 4.770 (H) 04/06/2021 09:56 AM    CBC Latest Ref Rng & Units 04/27/2021 04/06/2021 03/17/2021  WBC 4.0 - 10.5 K/uL 5.1 5.2 5.0  Hemoglobin 13.0 - 17.0 g/dL 12.5(L) 11.6(L) 10.6(L)  Hematocrit 39.0 - 52.0 % 35.8(L) 34.2(L) 31.8(L)  Platelets 150 - 400 K/uL 167 153 144(L)    No results found for: VD25OH  Clinical ASCVD: Yes  The ASCVD Risk score (Arnett DK, et al., 2019) failed to calculate for the following reasons:   The 2019 ASCVD risk score is only valid for ages 28 to 53    Depression screen [facility] 2/9 01/29/2021 01/29/2020 01/24/2019  Decreased  Interest 0 0 0  Down, Depressed, Hopeless 0 0 0  PHQ - 2 Score 0 0 0      Social History   Tobacco Use  Smoking Status Former   Packs/day: 1.00   Years: 50.00   Pack years: 50.00   Types: Cigarettes   Quit date: 04/28/2014   Years since quitting: 7.0  Smokeless Tobacco Never   BP Readings from Last 3 Encounters:  04/27/21 (!) 144/57  04/14/21 138/72  04/06/21 (!) 159/67   Pulse Readings from Last 3 Encounters:  04/27/21 81  04/14/21 68  04/06/21 86   Wt Readings from Last 3 Encounters:  04/27/21 241 lb 5 oz (109.5 kg)  04/14/21 242 lb 12.8 oz (110.1 kg)  04/06/21 243 lb 1.6 oz (110.3 kg)   BMI Readings from Last 3 Encounters:  04/27/21 32.06 kg/m  04/14/21 32.25 kg/m  04/06/21 32.29 kg/m    Assessment/Interventions: Review of patient past medical history,  allergies, medications, health status, including review of consultants reports, laboratory and other test data, was performed as part of comprehensive evaluation and provision of chronic care management services.   SDOH:  (Social Determinants of Health) assessments and interventions performed: Yes SDOH Interventions    Flowsheet Row Most Recent Value  SDOH Interventions   Financial Strain Interventions Intervention Not Indicated  Transportation Interventions Intervention Not Indicated      SDOH Screenings   Alcohol Screen: Not on file  Depression (PHQ2-9): Low Risk    PHQ-2 Score: 0  Financial Resource Strain: Low Risk    Difficulty of Paying Living Expenses: Not hard at all  Food Insecurity: Not on file  Housing: Not on file  Physical Activity: Not on file  Social Connections: Not on file  Stress: Not on file  Tobacco Use: Medium Risk   Smoking Tobacco Use: Former   Smokeless Tobacco Use: Never   Passive Exposure: Not on file  Transportation Needs: No Transportation Needs   Lack of Transportation (Medical): No   Lack of Transportation (Non-Medical): No   Patient is retired and is not able to do a whole lot with lung cancer. He cannot do a lot of walking because he gets short winded easily.  Patient is doing Keytruda infusions every 3 weeks and is going through January. He has some edema and this is making his legs and feet swollen and numb. Patient's right hand is also about 25% numb.   Patient doesn't weigh himself and does keep his feet elevated throughout the day. The swelling doesn't ever go down completely and he has used compression stockings in the past but they didn't seem to help much. Patient doesn't want to take a medication for neuropathy.  CCM Care Plan  Allergies  Allergen Reactions   Paclitaxel Shortness Of Breath    SOB and light headedness, flushing and 8/10 pain in left flank Pepcid and Solumedrol administered.  Able to tolerate remainder of Taxol  infusion   Vancomycin     Red Man Syndrome    Amlodipine Swelling   Latex Other (See Comments)    "possibly allergic- dry, itching, rash"    Medications Reviewed Today     Reviewed by Viona Gilmore, [facility] (Pharmacist) on 05/14/21 at 1312  Med List Status: <None>   Medication Order Taking? Sig Documenting Provider Last Dose Status Informant  Ascorbic Acid (VITAMIN C) 1000 MG tablet [id number] Yes Take 1,000 mg by mouth daily. [provider] Taking Active Self  Cholecalciferol (VITAMIN D-3) 125 MCG (5000  UT) TABS [id number] Yes Take 1 tablet by mouth daily. [provider] Taking Active   fexofenadine (ALLEGRA) 180 MG tablet [id number] Yes Take 180 mg by mouth as needed. [provider] Taking Active   hydrochlorothiazide (MICROZIDE) 12.5 MG capsule [id number] Yes Take 1 capsule (12.5 mg total) by mouth daily. [name], MD Taking Active   loratadine (CLARITIN) 10 MG tablet [id number] Yes Take 10 mg by mouth daily. [provider] Taking Active   losartan (COZAAR) 100 MG tablet [id number] Yes Take 1 tablet (100 mg total) by mouth daily. [name], MD Taking Active   metoprolol tartrate (LOPRESSOR) 50 MG tablet [id number] Yes TAKE ONE TABLET BY MOUTH TWICE DAILY. [name], MD Taking Active   Multiple Vitamins-Minerals (PRESERVISION AREDS 2) CAPS [id number] Yes Take 1 capsule by mouth in the morning and at bedtime. [provider] Taking Active Self  Jonetta Speak LANCETS [place] [id number]  Patient is to test one time a day DX: E11.9 [name], MD  Active Self  [facility] VERIO test strip [id number]  USE TO TEST BLOOD SUGAR ONCE DAILY. [name], MD  Active Self  prochlorperazine (COMPAZINE) 10 MG tablet [id number] No Take 1 tablet (10 mg total) by mouth every 6 (six) hours as needed for nausea or vomiting.  Patient not taking: Reported on 05/14/2021   Alex Bears, MD Not Taking Active   tamsulosin [facility]) 0.4 MG CAPS  capsule [id number] Yes Take 1 capsule (0.4 mg total) by mouth daily after supper. [name], MD Taking Active   triamcinolone cream (KENALOG) 0.1 % [id number] Yes APPLY TO AFFECTED AREA TWICE A DAY.  Patient taking differently: Apply 1 application topically as needed.   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   Internal carotid artery stenosis, right 11/21/2020   Aortic atherosclerosis ([place]) 02/14/2018   Onychomycosis 09/26/2017   Macular degeneration 01/25/2017   History of cataract surgery 01/25/2017   Statin intolerance 09/22/2016   Controlled type 2 diabetes mellitus without complication, without long-term current use of insulin ([place]) 09/22/2016   Senile purpura ([place]) 01/13/2015   Hx of adenomatous colonic polyps 01/13/2015   Actinic keratosis of multiple sites of head and neck 12/09/2014   Status post THR (total hip replacement) 01/04/2014   Hypertension associated with diabetes ([place]) 10/14/2011   Hyperlipidemia associated with type 2 diabetes mellitus ([place]) 10/14/2011   Psoriasis 10/14/2011   Obesity (BMI 30-39.9) 10/14/2011   Allergic rhinitis due to pollen 10/14/2011   Former smoker 10/14/2011    Immunization History  Administered Date(s) Administered   Fluad Quad(high Dose 65+) 04/10/2019, 04/09/2020, 04/14/2021   Influenza Split 04/22/2009, 07/02/2010, 05/12/2011, 03/28/2012   Influenza, High Dose Seasonal PF 04/02/2013, 04/23/2014, 04/09/2015, 03/16/2016, 03/29/2017, 04/17/2018   PFIZER Comirnaty(Gray Top)Covid-19 Tri-Sucrose Vaccine 01/29/2021   PFIZER(Purple Top)SARS-COV-2 Vaccination 08/24/2019, 09/18/2019, 05/28/2020   Pneumococcal Conjugate-13 04/23/2014    Pneumococcal Polysaccharide-23 03/29/2005   Td 03/29/2005   Tdap 02/11/2012    Conditions to be addressed/monitored:  Hypertension, Hyperlipidemia, Diabetes, BPH, and Allergic Rhinitis  Care Plan : [place]  Updates made by Viona Gilmore, [place] since 05/14/2021 12:00 AM  Problem: Problem: Hypertension, Hyperlipidemia, Diabetes, BPH, and Allergic Rhinitis      Long-Range Goal: Patient-Specific Goal   Start Date: 05/14/2021  Expected End Date: 05/14/2022  This Visit's Progress: On track  Priority: High  Note:   Current Barriers:  Unable to achieve control of swelling and cholesterol   Pharmacist Clinical Goal(s):  Patient will achieve adherence to monitoring guidelines and medication adherence to achieve therapeutic efficacy achieve control of cholesterol as evidenced by next lipid panel  through collaboration with PharmD and provider.   Interventions: 1:1 collaboration with [name], MD regarding development and update of comprehensive plan of care as evidenced by provider attestation and co-signature Inter-disciplinary care team collaboration (see longitudinal plan of care) Comprehensive medication review performed; medication list updated in electronic medical record  Hypertension (BP goal <140/90) -Uncontrolled -Current treatment: Losartan 100 mg 1 tablet daily - in AM Hydrochlorothiazide 12.5 mg 1 capsule daily - in AM Metoprolol tartrate 50 mg 1 tablet twice daily -Medications previously tried: none  -Current home readings: has an arm cuff but does not check often at home -Current dietary habits: doesn't eat a lot of salt; does like some salt; frozen meal once a week; rarely canned vegetables but some canned soup -Current exercise habits: unable to do much -Denies hypotensive/hypertensive symptoms -Educated on BP goals and benefits of medications for prevention of heart attack, stroke and kidney damage; Exercise goal of 150 minutes per  week; Importance of home blood pressure monitoring; Proper BP monitoring technique; -Counseled to monitor BP at home weekly, document, and provide log at future appointments -Counseled on diet and exercise extensively Recommended to continue current medication Recommended moving losartan to the evening to spread out BP lowering.  Hyperlipidemia/aortic atherosclerosis: (LDL goal < 70) -Uncontrolled -Current treatment: No medications -Medications previously tried: statins, Zetia (myalgias)  -Current dietary patterns: cooks with vegetable oil and some olive oil  -Current exercise habits: unable to -Educated on Cholesterol goals;  Benefits of statin for ASCVD risk reduction; Importance of limiting foods high in cholesterol; Exercise goal of 150 minutes per week; -Counseled on diet and exercise extensively Recommended repeat lipid panel. Consider PCSK9 therapy.  Diabetes (A1c goal <7%) -Controlled -Current medications: No medications -Medications previously tried: none  -Current home glucose readings fasting glucose: 95-105 post prandial glucose: n/a -Denies hypoglycemic/hyperglycemic symptoms -Current meal patterns:  breakfast: n/a  lunch: n/a  dinner: n/a snacks: n/a drinks: n/a -Current exercise: unable to -Educated on A1c and blood sugar goals; Benefits of routine self-monitoring of blood sugar; -Counseled to check feet daily and get yearly eye exams -Counseled on diet and exercise extensively  BPH (Goal: minimize symptoms of enlarged prostate) -Controlled -Current treatment  Tamsulosin 0.4 mg 1 capsule daily after supper -Medications previously tried: none  -Recommended to continue current medication  Allergic rhinitis (Goal: minimize symptoms) -Controlled -Current treatment  Loratadine 10 mg 1 tablet daily Allegra 180 mg 1 tablet daily as needed -Medications previously tried: none  -Counseled on alternating use of antihistamines and not using in the same  day.  Health Maintenance -Vaccine gaps: COVID booster, shingrix -Current therapy:  Preservision 2 1 capsule twice daily Prochlorperazine 10 mg 1 tablet as needed Triamcinolone cream 0.1% as needed Vitamin C 1000 mcg 1 tablet daily Vitamin D 5000 units daily -Educated on Cost vs benefit of each product must be carefully weighed by individual consumer Supplements may interfere with prescription drugs -Patient is satisfied with current therapy and denies issues -Recommended switching to every other day for vitamin  D to avoid overdose.  Patient Goals/Self-Care Activities Patient will:  - take medications as prescribed as evidenced by patient report and record review check glucose weekly, document, and provide at future appointments check blood pressure weekly, document, and provide at future appointments  Follow Up Plan: Telephone follow up appointment with care management team member scheduled for: 6 months        Medication Assistance: None required.  Patient affirms current coverage meets needs.  Compliance/Adherence/Medication fill history: Care Gaps: COVID booster, eye exam, urine microalbumin BP - 138/72 (10/18) A1c: 6.4% (01/29/21)  Star-Rating Drugs: Losartan (Cozaar) 100 mg - Last filled 04/13/21 90 DS at [facility] [place]  Patient's preferred pharmacy is:  [name], [place] [address] Phone: [phone] Fax: [phone]  [id number] [place] [id number]: [phone]: [phone] Uses pill box? No - has them all in one container  Pt endorses 99% compliance   We discussed: Current pharmacy is preferred with insurance plan and patient is satisfied with pharmacy services Patient decided to: Continue current medication management strategy  Care Plan  and Follow Up Patient Decision:  Patient agrees to Care Plan and Follow-up.  Plan: Telephone follow up appointment with care management team member scheduled for:  6 months  MJeni Salles PharmD, [phone]

## 2021-05-14 NOTE — Patient Instructions (Signed)
Hi Alex Edwards,  It was great to get to meet you over the telephone! Below is a summary of some of the topics we discussed.   Don't forget to switch your vitamin D to every other day to avoid possibly taking too much and also move your losartan to the evening to spread out your blood pressure lowering medications.  Keep on checking your blood pressure at home and my assistant will reach out to see how that is going in a couple of months.  Please reach out to me if you have any questions or need anything before our follow up!  Best, Maddie  [name], PharmD, [facility] [phone]   Visit Information   Goals Addressed             This Visit's Progress    Track and Manage My Blood Pressure-Hypertension       Timeframe:  Long-Range Goal Priority:  Medium Start Date:                             Expected End Date:                       Follow Up Date 08/14/21    - check blood pressure weekly - choose a place to take my blood pressure (home, clinic or office, retail store) - write blood pressure results in a log or diary    Why is this important?   You won't feel high blood pressure, but it can still hurt your blood vessels.  High blood pressure can cause heart or kidney problems. It can also cause a stroke.  Making lifestyle changes like losing a little weight or eating less salt will help.  Checking your blood pressure at home and at different times of the day can help to control blood pressure.  If the doctor prescribes medicine remember to take it the way the doctor ordered.  Call the office if you cannot afford the medicine or if there are questions about it.     Notes:        Patient Care Plan: CCM Pharmacy Care Plan     Problem Identified: Problem: Hypertension, Hyperlipidemia, Diabetes, BPH, and Allergic Rhinitis      Long-Range Goal: Patient-Specific Goal   Start Date: 05/14/2021  Expected End Date: 05/14/2022   This Visit's Progress: On track  Priority: High  Note:   Current Barriers:  Unable to achieve control of swelling and cholesterol   Pharmacist Clinical Goal(s):  Patient will achieve adherence to monitoring guidelines and medication adherence to achieve therapeutic efficacy achieve control of cholesterol as evidenced by next lipid panel  through collaboration with PharmD and provider.   Interventions: 1:1 collaboration with [name], MD regarding development and update of comprehensive plan of care as evidenced by provider attestation and co-signature Inter-disciplinary care team collaboration (see longitudinal plan of care) Comprehensive medication review performed; medication list updated in electronic medical record  Hypertension (BP goal <140/90) -Uncontrolled -Current treatment: Losartan 100 mg 1 tablet daily - in AM Hydrochlorothiazide 12.5 mg 1 capsule daily - in AM Metoprolol tartrate 50 mg 1 tablet twice daily -Medications previously tried: none  -Current home readings: has an arm cuff but does not check often at home -Current dietary habits: doesn't eat a lot of salt; does like some salt; frozen meal once a week; rarely canned vegetables but some canned soup -Current  exercise habits: unable to do much -Denies hypotensive/hypertensive symptoms -Educated on BP goals and benefits of medications for prevention of heart attack, stroke and kidney damage; Exercise goal of 150 minutes per week; Importance of home blood pressure monitoring; Proper BP monitoring technique; -Counseled to monitor BP at home weekly, document, and provide log at future appointments -Counseled on diet and exercise extensively Recommended to continue current medication Recommended moving losartan to the evening to spread out BP lowering.  Hyperlipidemia/aortic atherosclerosis: (LDL goal < 70) -Uncontrolled -Current treatment: No medications -Medications previously tried: statins, Zetia  (myalgias)  -Current dietary patterns: cooks with vegetable oil and some olive oil  -Current exercise habits: unable to -Educated on Cholesterol goals;  Benefits of statin for ASCVD risk reduction; Importance of limiting foods high in cholesterol; Exercise goal of 150 minutes per week; -Counseled on diet and exercise extensively Recommended repeat lipid panel. Consider PCSK9 therapy.  Diabetes (A1c goal <7%) -Controlled -Current medications: No medications -Medications previously tried: none  -Current home glucose readings fasting glucose: 95-105 post prandial glucose: n/a -Denies hypoglycemic/hyperglycemic symptoms -Current meal patterns:  breakfast: n/a  lunch: n/a  dinner: n/a snacks: n/a drinks: n/a -Current exercise: unable to -Educated on A1c and blood sugar goals; Benefits of routine self-monitoring of blood sugar; -Counseled to check feet daily and get yearly eye exams -Counseled on diet and exercise extensively  BPH (Goal: minimize symptoms of enlarged prostate) -Controlled -Current treatment  Tamsulosin 0.4 mg 1 capsule daily after supper -Medications previously tried: none  -Recommended to continue current medication  Allergic rhinitis (Goal: minimize symptoms) -Controlled -Current treatment  Loratadine 10 mg 1 tablet daily Allegra 180 mg 1 tablet daily as needed -Medications previously tried: none  -Counseled on alternating use of antihistamines and not using in the same day.  Health Maintenance -Vaccine gaps: COVID booster, shingrix -Current therapy:  Preservision 2 1 capsule twice daily Prochlorperazine 10 mg 1 tablet as needed Triamcinolone cream 0.1% as needed Vitamin C 1000 mcg 1 tablet daily Vitamin D 5000 units daily -Educated on Cost vs benefit of each product must be carefully weighed by individual consumer Supplements may interfere with prescription drugs -Patient is satisfied with current therapy and denies issues -Recommended switching  to every other day for vitamin D to avoid overdose.  Patient Goals/Self-Care Activities Patient will:  - take medications as prescribed as evidenced by patient report and record review check glucose weekly, document, and provide at future appointments check blood pressure weekly, document, and provide at future appointments  Follow Up Plan: Telephone follow up appointment with care management team member scheduled for: 6 months      Alex Edwards was given information about Chronic Care Management services today including:  CCM service includes personalized support from designated clinical staff supervised by his physician, including individualized plan of care and coordination with other care providers 24/7 contact phone numbers for assistance for urgent and routine care needs. Standard insurance, coinsurance, copays and deductibles apply for chronic care management only during months in which we provide at least 20 minutes of these services. Most insurances cover these services at 100%, however patients may be responsible for any copay, coinsurance and/or deductible if applicable. This service may help you avoid the need for more expensive face-to-face services. Only one practitioner may furnish and bill the service in a calendar month. The patient may stop CCM services at any time (effective at the end of the month) by phone call to the office staff.  Patient agreed to services  and verbal consent obtained.   Patient verbalizes understanding of instructions provided today and agrees to view in [place].  Telephone follow up appointment with pharmacy team member scheduled for: 6 months  Alex Edwards, [facility]

## 2021-05-18 ENCOUNTER — Inpatient Hospital Stay: Payer: Medicare Other | Attending: Internal Medicine

## 2021-05-18 ENCOUNTER — Inpatient Hospital Stay: Payer: Medicare Other

## 2021-05-18 ENCOUNTER — Encounter: Payer: Self-pay | Admitting: Internal Medicine

## 2021-05-18 ENCOUNTER — Inpatient Hospital Stay (HOSPITAL_BASED_OUTPATIENT_CLINIC_OR_DEPARTMENT_OTHER): Payer: Medicare Other | Admitting: Internal Medicine

## 2021-05-18 ENCOUNTER — Other Ambulatory Visit: Payer: Self-pay

## 2021-05-18 ENCOUNTER — Other Ambulatory Visit: Payer: Self-pay | Admitting: Physician Assistant

## 2021-05-18 VITALS — BP 149/72 | HR 83 | Temp 97.1°F | Resp 18 | Wt 248.2 lb

## 2021-05-18 DIAGNOSIS — J432 Centrilobular emphysema: Secondary | ICD-10-CM | POA: Diagnosis not present

## 2021-05-18 DIAGNOSIS — C3490 Malignant neoplasm of unspecified part of unspecified bronchus or lung: Secondary | ICD-10-CM

## 2021-05-18 DIAGNOSIS — Z5112 Encounter for antineoplastic immunotherapy: Secondary | ICD-10-CM | POA: Insufficient documentation

## 2021-05-18 DIAGNOSIS — C3492 Malignant neoplasm of unspecified part of left bronchus or lung: Secondary | ICD-10-CM

## 2021-05-18 DIAGNOSIS — I2510 Atherosclerotic heart disease of native coronary artery without angina pectoris: Secondary | ICD-10-CM | POA: Insufficient documentation

## 2021-05-18 DIAGNOSIS — I700 Atherosclerosis of aorta: Secondary | ICD-10-CM | POA: Diagnosis not present

## 2021-05-18 DIAGNOSIS — E119 Type 2 diabetes mellitus without complications: Secondary | ICD-10-CM | POA: Diagnosis not present

## 2021-05-18 DIAGNOSIS — I10 Essential (primary) hypertension: Secondary | ICD-10-CM | POA: Insufficient documentation

## 2021-05-18 DIAGNOSIS — N210 Calculus in bladder: Secondary | ICD-10-CM | POA: Insufficient documentation

## 2021-05-18 DIAGNOSIS — E785 Hyperlipidemia, unspecified: Secondary | ICD-10-CM | POA: Insufficient documentation

## 2021-05-18 DIAGNOSIS — E669 Obesity, unspecified: Secondary | ICD-10-CM | POA: Insufficient documentation

## 2021-05-18 DIAGNOSIS — Z87891 Personal history of nicotine dependence: Secondary | ICD-10-CM | POA: Diagnosis not present

## 2021-05-18 DIAGNOSIS — C3412 Malignant neoplasm of upper lobe, left bronchus or lung: Secondary | ICD-10-CM | POA: Diagnosis not present

## 2021-05-18 DIAGNOSIS — Z5111 Encounter for antineoplastic chemotherapy: Secondary | ICD-10-CM

## 2021-05-18 DIAGNOSIS — C7972 Secondary malignant neoplasm of left adrenal gland: Secondary | ICD-10-CM | POA: Diagnosis not present

## 2021-05-18 DIAGNOSIS — E039 Hypothyroidism, unspecified: Secondary | ICD-10-CM

## 2021-05-18 LAB — COMPREHENSIVE METABOLIC PANEL
ALT: 15 U/L (ref 0–44)
AST: 17 U/L (ref 15–41)
Albumin: 3.8 g/dL (ref 3.5–5.0)
Alkaline Phosphatase: 67 U/L (ref 38–126)
Anion gap: 8 (ref 5–15)
BUN: 22 mg/dL (ref 8–23)
CO2: 25 mmol/L (ref 22–32)
Calcium: 9 mg/dL (ref 8.9–10.3)
Chloride: 105 mmol/L (ref 98–111)
Creatinine, Ser: 1.16 mg/dL (ref 0.61–1.24)
GFR, Estimated: >60 mL/min (ref >60)
Glucose, Bld: 150 mg/dL — ABNORMAL HIGH (ref 70–99)
Potassium: 4.4 mmol/L (ref 3.5–5.1)
Sodium: 138 mmol/L (ref 135–145)
Total Bilirubin: 0.4 mg/dL (ref 0.3–1.2)
Total Protein: 6.8 g/dL (ref 6.5–8.1)

## 2021-05-18 LAB — CBC WITH DIFFERENTIAL (CANCER CENTER ONLY)
Abs Immature Granulocytes: 0.03 10*3/uL (ref 0.00–0.07)
Basophils Absolute: 0 10*3/uL (ref 0.0–0.1)
Basophils Relative: 1 %
Eosinophils Absolute: 0.3 10*3/uL (ref 0.0–0.5)
Eosinophils Relative: 5 %
HCT: 35.4 % — ABNORMAL LOW (ref 39.0–52.0)
Hemoglobin: 11.9 g/dL — ABNORMAL LOW (ref 13.0–17.0)
Immature Granulocytes: 1 %
Lymphocytes Relative: 25 %
Lymphs Abs: 1.4 10*3/uL (ref 0.7–4.0)
MCH: 32.2 pg (ref 26.0–34.0)
MCHC: 33.6 g/dL (ref 30.0–36.0)
MCV: 95.9 fL (ref 80.0–100.0)
Monocytes Absolute: 0.5 10*3/uL (ref 0.1–1.0)
Monocytes Relative: 10 %
Neutro Abs: 3.3 10*3/uL (ref 1.7–7.7)
Neutrophils Relative %: 58 %
Platelet Count: 157 10*3/uL (ref 150–400)
RBC: 3.69 MIL/uL — ABNORMAL LOW (ref 4.22–5.81)
RDW: 11.7 % (ref 11.5–15.5)
WBC Count: 5.5 10*3/uL (ref 4.0–10.5)
nRBC: 0 % (ref 0.0–0.2)

## 2021-05-18 LAB — TSH: TSH: 5.35 u[IU]/mL — ABNORMAL HIGH (ref 0.320–4.118)

## 2021-05-18 MED ORDER — SODIUM CHLORIDE 0.9 % IV SOLN
200.0000 mg | Freq: Once | INTRAVENOUS | Status: AC
  Administered 2021-05-18: 200 mg via INTRAVENOUS
  Filled 2021-05-18: qty 8

## 2021-05-18 MED ORDER — LEVOTHYROXINE SODIUM 50 MCG PO TABS: 50.0000 ug | ORAL_TABLET | Freq: Every day | ORAL | 1 refills | Status: DC

## 2021-05-18 MED ORDER — SODIUM CHLORIDE 0.9 % IV SOLN: Freq: Once | INTRAVENOUS | Status: AC

## 2021-05-18 NOTE — Patient Instructions (Signed)
[facility]COL[facility]   Discharge Instructions: Thank you for choosing [facility] to provide your oncology and hematology care.   If you have a lab appointment with the [facility], please go directly to the [facility] and check in at the registration area.   Wear comfortable clothing and clothing appropriate for easy access to any Portacath or PICC line.   We strive to give you quality time with your provider. You may need to reschedule your appointment if you arrive late (15 or more minutes).  Arriving late affects you and other patients whose appointments are after yours.  Also, if you miss three or more appointments without notifying the office, you may be dismissed from the clinic at the provider's discretion.      For prescription refill requests, have your pharmacy contact our office and allow 72 hours for refills to be completed.    Today you received the following chemotherapy and/or immunotherapy agents: pembrolizumab.      To help prevent nausea and vomiting after your treatment, we encourage you to take your nausea medication as directed.  BELOW ARE SYMPTOMS THAT SHOULD BE REPORTED IMMEDIATELY: *FEVER GREATER THAN 100.4 F (38 C) OR HIGHER *CHILLS OR SWEATING *NAUSEA AND VOMITING THAT IS NOT CONTROLLED WITH YOUR NAUSEA MEDICATION *UNUSUAL SHORTNESS OF BREATH *UNUSUAL BRUISING OR BLEEDING *URINARY PROBLEMS (pain or burning when urinating, or frequent urination) *BOWEL PROBLEMS (unusual diarrhea, constipation, pain near the anus) TENDERNESS IN MOUTH AND THROAT WITH OR WITHOUT PRESENCE OF ULCERS (sore throat, sores in mouth, or a toothache) UNUSUAL RASH, SWELLING OR PAIN  UNUSUAL VAGINAL DISCHARGE OR ITCHING   Items with * indicate a potential emergency and should be followed up as soon as possible or go to the Emergency Department if any problems should occur.  Please show the CHEMOTHERAPY ALERT CARD or IMMUNOTHERAPY ALERT CARD at  check-in to the Emergency Department and triage nurse.  Should you have questions after your visit or need to cancel or reschedule your appointment, please contact [facility]  Dept: [phone]  and follow the prompts.  Office hours are 8:00 a.m. to 4:30 p.m. Monday - Friday. Please note that voicemails left after 4:00 p.m. may not be returned until the following business day.  We are closed weekends and major holidays. You have access to a nurse at all times for urgent questions. Please call the main number to the clinic Dept: [phone] and follow the prompts.   For any non-urgent questions, you may also contact your provider using MyChart. We now offer e-Visits for anyone 27 and older to request care online for non-urgent symptoms. For details visit [email or web address].   Also download the MyChart app! Go to the app store, search "MyChart", open the app, select [facility], and log in with your MyChart username and password.  Due to Covid, a mask is required upon entering the hospital/clinic. If you do not have a mask, one will be given to you upon arrival. For doctor visits, patients may have 1 support person aged 26 or older with them. For treatment visits, patients cannot have anyone with them due to current Covid guidelines and our immunocompromised population.

## 2021-05-18 NOTE — Progress Notes (Signed)
[place] Telephone:([phone]) [phone]   [phone]  OFFICE PROGRESS NOTE  [name], [place] [address]  DIAGNOSIS: Stage IV (T3, N2, M1c) non-small cell lung cancer, squamous cell carcinoma presented with large left upper lobe lung mass in addition to left hilar and mediastinal lymphadenopathy as well as bilateral pulmonary nodules and left adrenal gland lesion diagnosed in May 2022.  PRIOR THERAPY: None  CURRENT THERAPY: Palliative systemic chemotherapy with carboplatin for an AUC of 5, paclitaxel 175 mg/m2 and Keytruda 200 mg IV every 3 weeks.  First dose expected on 12/22/2020. Status post 7 cycles.  Starting from cycle #5 the patient will be on maintenance treatment with single agent Keytruda 200 Mg IV every 3 weeks.   INTERVAL HISTORY: Alex Edwards 85 y.o. male returns to the clinic today for follow-up visit accompanied by his wife.  The patient is feeling fine today with no concerning complaints except for mild rash in the legs and the breast area.  He denied having any current chest pain, shortness of breath except with exertion with no cough or hemoptysis.  He denied having any fever or chills.  He has no nausea, vomiting, diarrhea or constipation.  He denied having any headache or visual changes.  He has no weight loss or night sweats.  He is here today for evaluation before starting cycle #8 of his treatment.    MEDICAL HISTORY: Past Medical History:  Diagnosis Date   BCE (basal cell epithelioma)    Bruises easily    on hands   Cough    smokers cough or perfurmes   Degeneration macular    right eye   Diabetes mellitus without complication (HCC)    Dyslipidemia    ED (erectile dysfunction)    Hx of adenomatous colonic polyps    Hyperlipidemia    Hypertension    borderline   Obesity    Psoriasis    Seasonal allergies    Smoker    former    ALLERGIES:  is allergic to paclitaxel, vancomycin, amlodipine,  and latex.  MEDICATIONS:  Current Outpatient Medications  Medication Sig Dispense Refill   Ascorbic Acid (VITAMIN C) 1000 MG tablet Take 1,000 mg by mouth daily.     Cholecalciferol (VITAMIN D-3) 125 MCG (5000 UT) TABS Take 1 tablet by mouth daily.     fexofenadine (ALLEGRA) 180 MG tablet Take 180 mg by mouth as needed.     hydrochlorothiazide (MICROZIDE) 12.5 MG capsule Take 1 capsule (12.5 mg total) by mouth daily. 90 capsule 0   loratadine (CLARITIN) 10 MG tablet Take 10 mg by mouth daily.     losartan (COZAAR) 100 MG tablet Take 1 tablet (100 mg total) by mouth daily. 90 tablet 1   metoprolol tartrate (LOPRESSOR) 50 MG tablet TAKE ONE TABLET BY MOUTH TWICE DAILY. 180 tablet 0   Multiple Vitamins-Minerals (PRESERVISION AREDS 2) CAPS Take 1 capsule by mouth in the morning and at bedtime.     [facility] Patient is to test one time a day DX: E11.9 100 each 4   ONETOUCH VERIO test strip USE TO TEST BLOOD SUGAR ONCE DAILY. 50 strip 2   prochlorperazine (COMPAZINE) 10 MG tablet Take 1 tablet (10 mg total) by mouth every 6 (six) hours as needed for nausea or vomiting. (Patient not taking: Reported on 05/14/2021) 30 tablet 0   tamsulosin (FLOMAX) 0.4 MG CAPS capsule Take 1 capsule (0.4  mg total) by mouth daily after supper. 30 capsule 3   triamcinolone cream (KENALOG) 0.1 % APPLY TO AFFECTED AREA TWICE A DAY. (Patient taking differently: Apply 1 application topically as needed.) 454 g 0   No current facility-administered medications for this visit.    SURGICAL HISTORY:  Past Surgical History:  Procedure Laterality Date   BRONCHIAL BRUSHINGS  11/18/2020   Procedure: BRONCHIAL BRUSHINGS;  Surgeon: Spero Geralds, MD;  Location: [facility];  Service: Pulmonary;;   BRONCHIAL NEEDLE ASPIRATION BIOPSY  11/18/2020   Procedure: BRONCHIAL NEEDLE ASPIRATION BIOPSIES;  Surgeon: Spero Geralds, MD;  Location: [facility];  Service: Pulmonary;;   BRONCHIAL WASHINGS  11/18/2020    Procedure: BRONCHIAL WASHINGS;  Surgeon: Spero Geralds, MD;  Location: [facility];  Service: Pulmonary;;   CATARACT EXTRACTION, BILATERAL Bilateral 2013   COLONOSCOPY  2005   [facility]   ENDOBRONCHIAL ULTRASOUND N/A 11/18/2020   Procedure: ENDOBRONCHIAL ULTRASOUND;  Surgeon: Spero Geralds, MD;  Location: [facility];  Service: Pulmonary;  Laterality: N/A;   POLYPECTOMY     SKIN CANCER EXCISION Right 1993   under eye-MOHS, freeze multiple places freq   TONSILLECTOMY  as child   TOTAL HIP ARTHROPLASTY Left 01/04/2014   Procedure: LEFT TOTAL HIP ARTHROPLASTY ANTERIOR APPROACH;  Surgeon: Mcarthur Rossetti, MD;  Location: [facility];  Service: Orthopedics;  Laterality: Left;   TOTAL HIP ARTHROPLASTY Left 02/14/2014   Procedure: Irrigation and Debridement left hip;  Surgeon: Mcarthur Rossetti, MD;  Location: [facility];  Service: Orthopedics;  Laterality: Left;    REVIEW OF SYSTEMS:  A comprehensive review of systems was negative except for: Constitutional: positive for fatigue Neurological: positive for paresthesia   PHYSICAL EXAMINATION: General appearance: alert, cooperative, fatigued, and no distress Head: Normocephalic, without obvious abnormality, atraumatic Neck: no adenopathy, no JVD, supple, symmetrical, trachea midline, and thyroid not enlarged, symmetric, no tenderness/mass/nodules Lymph nodes: Cervical, supraclavicular, and axillary nodes normal. Resp: clear to auscultation bilaterally Back: symmetric, no curvature. ROM normal. No CVA tenderness. Cardio: regular rate and rhythm, S1, S2 normal, no murmur, click, rub or gallop GI: soft, non-tender; bowel sounds normal; no masses,  no organomegaly Extremities: extremities normal, atraumatic, no cyanosis or edema  ECOG PERFORMANCE STATUS: 1 - Symptomatic but completely ambulatory  Blood pressure (!) 149/72, pulse 83, temperature (!) 97.1 F (36.2 C), resp. rate 18, weight 248 lb 3.2 oz (112.6 kg), SpO2 97 %.  LABORATORY  DATA: Lab Results  Component Value Date   WBC 5.5 05/18/2021   HGB 11.9 (L) 05/18/2021   HCT 35.4 (L) 05/18/2021   MCV 95.9 05/18/2021   PLT 157 05/18/2021      Chemistry      Component Value Date/Time   NA 138 05/18/2021 1010   NA 141 01/29/2020 1157   K 4.4 05/18/2021 1010   CL 105 05/18/2021 1010   CO2 25 05/18/2021 1010   BUN 22 05/18/2021 1010   BUN 12 01/29/2020 1157   CREATININE 1.16 05/18/2021 1010   CREATININE 1.04 03/17/2021 1047   CREATININE 1.11 07/26/2016 0921      Component Value Date/Time   CALCIUM 9.0 05/18/2021 1010   ALKPHOS 67 05/18/2021 1010   AST 17 05/18/2021 1010   AST 13 (L) 03/17/2021 1047   ALT 15 05/18/2021 1010   ALT 8 03/17/2021 1047   BILITOT 0.4 05/18/2021 1010   BILITOT 0.5 03/17/2021 1047       RADIOGRAPHIC STUDIES: CT Chest W Contrast  Result Date: 04/29/2021 CLINICAL  DATA:  Primary Cancer Type: Lung Imaging Indication: Assess response to therapy Interval therapy since last imaging? Yes Initial Cancer Diagnosis Date: 11/18/2020; Established by: Biopsy-proven Detailed Pathology: Stage IV non-small cell lung cancer, squamous cell carcinoma. Primary Tumor location:  Left upper lobe. Left adrenal gland lesion. Surgeries: No thoracic. Hip arthroplasty. Chemotherapy: Yes; Ongoing? No; Most recent administration: 03/17/2021 Immunotherapy?  Yes; Type: Keytruda; Ongoing? Yes Radiation therapy? No EXAM: CT CHEST ABDOMEN AND PELVIS WITH CONTRAST TECHNIQUE: Multidetector CT imaging of the chest was performed during intravenous contrast administration. CONTRAST:  39mL OMNIPAQUE IOHEXOL 350 MG/ML SOLN COMPARISON:  Most recent CT chest, abdomen and pelvis 02/19/2021. 12/05/2020 PET-CT. FINDINGS: CT CHEST FINDINGS Cardiovascular: Aortic atherosclerosis. Normal heart size. Left coronary artery calcifications. No pericardial effusion. Mediastinum/Nodes: Unchanged prominent left paratracheal lymph node measuring up to 1.9 x 1.2 cm (series 2, image 25). Thyroid  gland, trachea, and esophagus demonstrate no significant findings. Lungs/Pleura: Mild centrilobular and paraseptal emphysema. Diffuse bilateral bronchial wall thickening. Interval decrease in size of a partially cavitary, spiculated subpleural mass in the peripheral left upper lobe, measuring 3.7 x 3.2 cm, previously 5.7 x 4.5 cm (series 4, image 68). Unchanged irregular subpleural opacity of the posterior right apex, measuring 1.2 x 1.1 cm (series 4, image 35). Extensive clustered centrilobular and tree-in-bud nodularity, most conspicuously in the right lung base (series 4, image 130) although also seen in the lateral segment right middle lobe (series 4, image 98) and in the anterior left upper lobe (series 4, image 77). Multiple new small nodules and irregular opacities seen on prior examination are almost completely resolved, for example of the medial posterior right upper lobe in the azygoesophageal recess (series 4, image 63) and of the superior segment left lower lobe (series 4, image 80). No pleural effusion or pneumothorax. Musculoskeletal: No chest wall mass or suspicious bone lesions identified. CT ABDOMEN PELVIS FINDINGS Hepatobiliary: No solid liver abnormality is seen. No gallstones, gallbladder wall thickening, or biliary dilatation. Pancreas: Unremarkable. No pancreatic ductal dilatation or surrounding inflammatory changes. Spleen: Normal in size without significant abnormality. Adrenals/Urinary Tract: Unchanged nodule of the lateral limb of the left adrenal gland measuring 1.6 x 1.5 cm (series 2, image 65). Kidneys are normal, without renal calculi, solid lesion, or hydronephrosis. Small calculi in the dependent right urinary bladder (series 2, image 112). Stomach/Bowel: Stomach is within normal limits. Appendix appears normal. No evidence of bowel wall thickening, distention, or inflammatory changes. Descending and sigmoid diverticulosis. Vascular/Lymphatic: Aortic atherosclerosis. No enlarged  abdominal or pelvic lymph nodes. Reproductive: Prostatomegaly. Other: Fat containing bilateral inguinal hernias. No abdominopelvic ascites. Musculoskeletal: No acute or significant osseous findings. IMPRESSION: 1. Interval decrease in size of a partially cavitary, spiculated subpleural mass in the peripheral left upper lobe, consistent with treatment response. 2. Unchanged prominent left paratracheal lymph nodes, previously FDG avid. 3. Unchanged irregular subpleural opacity of the posterior right apex, measuring 1.2 x 1.1 cm, which was new on prior examination and remains nonspecific. Attention on follow-up. 4. Multiple other new small nodules and irregular opacities seen on prior examination are almost completely resolved, consistent with resolution of nonspecific infection or inflammation. 5. Unchanged left adrenal nodule, previously with equivocal FDG avidity. 6. Extensive clustered centrilobular and tree-in-bud nodularity throughout the lungs. Findings are consistent with atypical infection, particularly atypical mycobacterium. 7. Prostatomegaly. 8. Small bladder calculi. 9. Emphysema. 10. Coronary artery disease. Aortic Atherosclerosis (ICD10-I70.0) and Emphysema (ICD10-J43.9). Electronically Signed   By: Delanna Ahmadi M.D.   On: 04/29/2021 10:52   CT Abdomen  Pelvis W Contrast  Result Date: 04/29/2021 CLINICAL DATA:  Primary Cancer Type: Lung Imaging Indication: Assess response to therapy Interval therapy since last imaging? Yes Initial Cancer Diagnosis Date: 11/18/2020; Established by: Biopsy-proven Detailed Pathology: Stage IV non-small cell lung cancer, squamous cell carcinoma. Primary Tumor location:  Left upper lobe. Left adrenal gland lesion. Surgeries: No thoracic. Hip arthroplasty. Chemotherapy: Yes; Ongoing? No; Most recent administration: 03/17/2021 Immunotherapy?  Yes; Type: Keytruda; Ongoing? Yes Radiation therapy? No EXAM: CT CHEST ABDOMEN AND PELVIS WITH CONTRAST TECHNIQUE: Multidetector CT  imaging of the chest was performed during intravenous contrast administration. CONTRAST:  25mL OMNIPAQUE IOHEXOL 350 MG/ML SOLN COMPARISON:  Most recent CT chest, abdomen and pelvis 02/19/2021. 12/05/2020 PET-CT. FINDINGS: CT CHEST FINDINGS Cardiovascular: Aortic atherosclerosis. Normal heart size. Left coronary artery calcifications. No pericardial effusion. Mediastinum/Nodes: Unchanged prominent left paratracheal lymph node measuring up to 1.9 x 1.2 cm (series 2, image 25). Thyroid gland, trachea, and esophagus demonstrate no significant findings. Lungs/Pleura: Mild centrilobular and paraseptal emphysema. Diffuse bilateral bronchial wall thickening. Interval decrease in size of a partially cavitary, spiculated subpleural mass in the peripheral left upper lobe, measuring 3.7 x 3.2 cm, previously 5.7 x 4.5 cm (series 4, image 68). Unchanged irregular subpleural opacity of the posterior right apex, measuring 1.2 x 1.1 cm (series 4, image 35). Extensive clustered centrilobular and tree-in-bud nodularity, most conspicuously in the right lung base (series 4, image 130) although also seen in the lateral segment right middle lobe (series 4, image 98) and in the anterior left upper lobe (series 4, image 77). Multiple new small nodules and irregular opacities seen on prior examination are almost completely resolved, for example of the medial posterior right upper lobe in the azygoesophageal recess (series 4, image 63) and of the superior segment left lower lobe (series 4, image 80). No pleural effusion or pneumothorax. Musculoskeletal: No chest wall mass or suspicious bone lesions identified. CT ABDOMEN PELVIS FINDINGS Hepatobiliary: No solid liver abnormality is seen. No gallstones, gallbladder wall thickening, or biliary dilatation. Pancreas: Unremarkable. No pancreatic ductal dilatation or surrounding inflammatory changes. Spleen: Normal in size without significant abnormality. Adrenals/Urinary Tract: Unchanged nodule of  the lateral limb of the left adrenal gland measuring 1.6 x 1.5 cm (series 2, image 65). Kidneys are normal, without renal calculi, solid lesion, or hydronephrosis. Small calculi in the dependent right urinary bladder (series 2, image 112). Stomach/Bowel: Stomach is within normal limits. Appendix appears normal. No evidence of bowel wall thickening, distention, or inflammatory changes. Descending and sigmoid diverticulosis. Vascular/Lymphatic: Aortic atherosclerosis. No enlarged abdominal or pelvic lymph nodes. Reproductive: Prostatomegaly. Other: Fat containing bilateral inguinal hernias. No abdominopelvic ascites. Musculoskeletal: No acute or significant osseous findings. IMPRESSION: 1. Interval decrease in size of a partially cavitary, spiculated subpleural mass in the peripheral left upper lobe, consistent with treatment response. 2. Unchanged prominent left paratracheal lymph nodes, previously FDG avid. 3. Unchanged irregular subpleural opacity of the posterior right apex, measuring 1.2 x 1.1 cm, which was new on prior examination and remains nonspecific. Attention on follow-up. 4. Multiple other new small nodules and irregular opacities seen on prior examination are almost completely resolved, consistent with resolution of nonspecific infection or inflammation. 5. Unchanged left adrenal nodule, previously with equivocal FDG avidity. 6. Extensive clustered centrilobular and tree-in-bud nodularity throughout the lungs. Findings are consistent with atypical infection, particularly atypical mycobacterium. 7. Prostatomegaly. 8. Small bladder calculi. 9. Emphysema. 10. Coronary artery disease. Aortic Atherosclerosis (ICD10-I70.0) and Emphysema (ICD10-J43.9). Electronically Signed   By: Jamse Mead.D.  On: 04/29/2021 10:52    ASSESSMENT AND PLAN: This is a very pleasant 85 years old white male recently diagnosed with Stage IV (T3, N2, M1c) non-small cell lung cancer, squamous cell carcinoma presented with large  left upper lobe lung mass in addition to left hilar and mediastinal lymphadenopathy as well as bilateral pulmonary nodules and left adrenal gland lesion diagnosed in May 2022. The patient is currently undergoing systemic chemotherapy with carboplatin for AUC of 5, paclitaxel 175 Mg/M2 and Keytruda 200 Mg IV every 3 weeks with Neulasta support status post 7 cycles.  Starting from cycle #5 the patient is on maintenance treatment with Keytruda 200 Mg IV every 3 weeks. The patient continues to tolerate his treatment with maintenance Keytruda fairly well. I recommended for him to proceed with cycle #8 today as planned. I will see him back for follow-up visit in 3 weeks for evaluation before the next cycle of his treatment. He had several question about his current condition and prognosis and I answered them completely to his satisfaction. The patient was advised to call immediately if he has any other concerning symptoms in the interval.  All questions were answered. The patient knows to call the clinic with any problems, questions or concerns. We can certainly see the patient much sooner if necessary.  Disclaimer: This note was dictated with voice recognition software. Similar sounding words can inadvertently be transcribed and may not be corrected upon review.

## 2021-05-25 DIAGNOSIS — C3492 Malignant neoplasm of unspecified part of left bronchus or lung: Secondary | ICD-10-CM | POA: Diagnosis not present

## 2021-05-25 DIAGNOSIS — J9601 Acute respiratory failure with hypoxia: Secondary | ICD-10-CM | POA: Diagnosis not present

## 2021-05-27 ENCOUNTER — Telehealth: Payer: Self-pay | Admitting: Medical Oncology

## 2021-05-27 DIAGNOSIS — E785 Hyperlipidemia, unspecified: Secondary | ICD-10-CM | POA: Diagnosis not present

## 2021-05-27 DIAGNOSIS — E119 Type 2 diabetes mellitus without complications: Secondary | ICD-10-CM | POA: Diagnosis not present

## 2021-05-27 DIAGNOSIS — E1169 Type 2 diabetes mellitus with other specified complication: Secondary | ICD-10-CM | POA: Diagnosis not present

## 2021-05-27 NOTE — Telephone Encounter (Signed)
Generalized rash, unbearable itching since Friday 11/25   Started Synthroid Monday 11/21 and received Keytruda same day-Cycle 8.  "I have been having a rash all along with [place] ,but not the itching".  Denied chest pain , SOB swelling. Benadryl did not help, hydrocortisone is helping . The itching is a little better.  Per Cassie I instructed Kisean to stop synthroid and monitor for symptoms.  See your PCP as scheduled and have him look at rash.   If he develops worsening symptoms or swelling , trouble breathing go to ED.

## 2021-06-01 ENCOUNTER — Encounter: Payer: Self-pay | Admitting: Family Medicine

## 2021-06-01 ENCOUNTER — Ambulatory Visit (INDEPENDENT_AMBULATORY_CARE_PROVIDER_SITE_OTHER): Payer: Medicare Other | Admitting: Family Medicine

## 2021-06-01 ENCOUNTER — Other Ambulatory Visit: Payer: Self-pay

## 2021-06-01 VITALS — BP 128/76 | HR 67 | Temp 97.3°F | Wt 248.4 lb

## 2021-06-01 DIAGNOSIS — E1159 Type 2 diabetes mellitus with other circulatory complications: Secondary | ICD-10-CM | POA: Diagnosis not present

## 2021-06-01 DIAGNOSIS — E1169 Type 2 diabetes mellitus with other specified complication: Secondary | ICD-10-CM

## 2021-06-01 DIAGNOSIS — E669 Obesity, unspecified: Secondary | ICD-10-CM

## 2021-06-01 DIAGNOSIS — E119 Type 2 diabetes mellitus without complications: Secondary | ICD-10-CM

## 2021-06-01 DIAGNOSIS — C3492 Malignant neoplasm of unspecified part of left bronchus or lung: Secondary | ICD-10-CM | POA: Diagnosis not present

## 2021-06-01 DIAGNOSIS — I152 Hypertension secondary to endocrine disorders: Secondary | ICD-10-CM | POA: Diagnosis not present

## 2021-06-01 DIAGNOSIS — E785 Hyperlipidemia, unspecified: Secondary | ICD-10-CM | POA: Diagnosis not present

## 2021-06-01 LAB — POCT UA - MICROALBUMIN
Albumin/Creatinine Ratio, Urine, POC: 26.7
Creatinine, POC: 97.3 mg/dL
Microalbumin Ur, POC: 26 mg/L

## 2021-06-01 LAB — POCT GLYCOSYLATED HEMOGLOBIN (HGB A1C): Hemoglobin A1C: 6.6 % — AB (ref 4.0–5.6)

## 2021-06-01 MED ORDER — HYDROXYZINE PAMOATE 25 MG PO CAPS: 25.0000 mg | ORAL_CAPSULE | Freq: Three times a day (TID) | ORAL | 1 refills | Status: DC | PRN

## 2021-06-01 NOTE — Addendum Note (Signed)
Addended by: [name] on: 06/01/2021 03:12 PM   Modules accepted: Orders

## 2021-06-01 NOTE — Progress Notes (Addendum)
Subjective:    Patient ID: Alex Edwards, male    DOB: 02-13-1936, 85 y.o.   MRN: [id number]  Alex Edwards is a 85 y.o. male who presents for follow-up of Type 2 diabetes mellitus.  Home blood sugar records:  fasting and post meal lowest 85 highest 150 Current symptoms/problems include none and have been unchanged. Daily foot checks: nephropathy  Any foot concerns: yes Exercise:  staying active 20 min a day Diet:good He continues on losartan and metoprolol and having no difficulty with that.  His allergies seem to be under good control.  He is now taking Keytruda and thinks that that might be causing a rash.  He was also placed on Synthroid due to a slightly elevated TSH and at the same time noted difficulty with erythema and a rash.  He then stopped the levothyroxine.  He still has difficulty with itching.  He did try Benadryl but had very little success with that.  He complains of a neuropathy since his chemotherapy mainly and numbness in his feet as well as generalized weakness. The following portions of the patient's history were reviewed and updated as appropriate: allergies, current medications, past medical history, past social history and problem list.  ROS as in subjective above.     Objective:    Physical Exam Alert and in no distress exam of his feet shows decreased sensation over the plantar surface of his feet with sensation over the dorsum of the foot.  Pulses were difficult to assess.  Lab data was reviewed and does show slightly elevated TSH.  Exam of the skin does show multiple erythematous lesions over his torso.  1 early right axillary abscesses noted. Hemoglobin A1c is 6.6  Lab Review Diabetic Labs Latest Ref Rng & Units 06/01/2021 05/18/2021 04/27/2021 04/06/2021 03/17/2021  HbA1c 4.0 - 5.6 % 6.6(A) - - - -  Microalbumin mg/L 26.0 - - - -  Micro/Creat Ratio - 26.7 - - - -  Chol 100 - 199 mg/dL - - - - -  HDL >39 mg/dL - - - - -  Calc LDL 0 - 99 mg/dL - - - - -   Triglycerides 0 - 149 mg/dL - - - - -  Creatinine 0.61 - 1.24 mg/dL - 1.16 1.26(H) 1.05 1.04   BP/Weight 06/01/2021 05/18/2021 04/27/2021 04/14/2021 71/11/2692  Systolic BP 854 627 035 009 381  Diastolic BP 76 72 57 72 67  Wt. (Lbs) 248.4 248.2 241.31 242.8 243.1  BMI 33 32.97 32.06 32.25 32.29   Foot/eye exam completion dates 06/01/2021 01/29/2021  Foot Form Completion Done Done    Ras  reports that he quit smoking about 7 years ago. His smoking use included cigarettes. He has a 50.00 pack-year smoking history. He has never used smokeless tobacco. He reports that he does not drink alcohol and does not use drugs.     Assessment & Plan:    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)  Hyperlipidemia associated with type 2 diabetes mellitus ([place])  Controlled type 2 diabetes mellitus without complication, without long-term current use of insulin ([place]) - Plan: POCT UA - Microalbumin, POCT glycosylated hemoglobin (Hb A1C)  Hypertension associated with diabetes (HCC)  Obesity (BMI 30-39.9) I had a long discussion with him and his wife concerning the [facility] and continuing on this versus stopping it.  He was thinking that this might be causing some of his problems.  I left a message for Dr. Earlie Server to call concerning  which antihistamine might be the best for the present circumstance.  I am waiting her back from them concerning that.  I will also have him start back taking his Synthroid since his TSH was elevated.  I explained that I did not think that the levothyroxine was causing difficulty with his rash.  Rx changes: none Education: Reviewed 'ABCs' of diabetes management (respective goals in parentheses):  A1C (<7), blood pressure (<130/80), and cholesterol (LDL <100). Compliance at present is estimated to be good. Efforts to improve compliance (if necessary) will be directed at  no change . Follow up: 4 months If the lesion in his right axilla gets worse, he is to return here  for an I&D. Atarax called in to help with the itching.

## 2021-06-08 ENCOUNTER — Inpatient Hospital Stay (HOSPITAL_BASED_OUTPATIENT_CLINIC_OR_DEPARTMENT_OTHER): Payer: Medicare Other | Admitting: Internal Medicine

## 2021-06-08 ENCOUNTER — Inpatient Hospital Stay: Payer: Medicare Other

## 2021-06-08 ENCOUNTER — Other Ambulatory Visit: Payer: Self-pay

## 2021-06-08 ENCOUNTER — Inpatient Hospital Stay: Payer: Medicare Other | Attending: Internal Medicine

## 2021-06-08 ENCOUNTER — Encounter: Payer: Self-pay | Admitting: Internal Medicine

## 2021-06-08 VITALS — BP 138/61 | HR 88 | Temp 97.7°F | Resp 21 | Ht 72.75 in | Wt 248.1 lb

## 2021-06-08 VITALS — Resp 17

## 2021-06-08 DIAGNOSIS — C3490 Malignant neoplasm of unspecified part of unspecified bronchus or lung: Secondary | ICD-10-CM | POA: Diagnosis not present

## 2021-06-08 DIAGNOSIS — G629 Polyneuropathy, unspecified: Secondary | ICD-10-CM | POA: Diagnosis not present

## 2021-06-08 DIAGNOSIS — Z5112 Encounter for antineoplastic immunotherapy: Secondary | ICD-10-CM | POA: Insufficient documentation

## 2021-06-08 DIAGNOSIS — Z87891 Personal history of nicotine dependence: Secondary | ICD-10-CM | POA: Diagnosis not present

## 2021-06-08 DIAGNOSIS — Z5111 Encounter for antineoplastic chemotherapy: Secondary | ICD-10-CM

## 2021-06-08 DIAGNOSIS — Z79899 Other long term (current) drug therapy: Secondary | ICD-10-CM | POA: Diagnosis not present

## 2021-06-08 DIAGNOSIS — C3412 Malignant neoplasm of upper lobe, left bronchus or lung: Secondary | ICD-10-CM | POA: Diagnosis not present

## 2021-06-08 DIAGNOSIS — C3492 Malignant neoplasm of unspecified part of left bronchus or lung: Secondary | ICD-10-CM

## 2021-06-08 LAB — CBC WITH DIFFERENTIAL (CANCER CENTER ONLY)
Abs Immature Granulocytes: 0.02 10*3/uL (ref 0.00–0.07)
Basophils Absolute: 0 10*3/uL (ref 0.0–0.1)
Basophils Relative: 1 %
Eosinophils Absolute: 0.3 10*3/uL (ref 0.0–0.5)
Eosinophils Relative: 5 %
HCT: 34.5 % — ABNORMAL LOW (ref 39.0–52.0)
Hemoglobin: 12 g/dL — ABNORMAL LOW (ref 13.0–17.0)
Immature Granulocytes: 0 %
Lymphocytes Relative: 20 %
Lymphs Abs: 1.3 10*3/uL (ref 0.7–4.0)
MCH: 32.4 pg (ref 26.0–34.0)
MCHC: 34.8 g/dL (ref 30.0–36.0)
MCV: 93.2 fL (ref 80.0–100.0)
Monocytes Absolute: 0.7 10*3/uL (ref 0.1–1.0)
Monocytes Relative: 10 %
Neutro Abs: 4.3 10*3/uL (ref 1.7–7.7)
Neutrophils Relative %: 64 %
Platelet Count: 175 10*3/uL (ref 150–400)
RBC: 3.7 MIL/uL — ABNORMAL LOW (ref 4.22–5.81)
RDW: 11.8 % (ref 11.5–15.5)
WBC Count: 6.7 10*3/uL (ref 4.0–10.5)
nRBC: 0 % (ref 0.0–0.2)

## 2021-06-08 LAB — TSH: TSH: 2.998 u[IU]/mL (ref 0.320–4.118)

## 2021-06-08 LAB — COMPREHENSIVE METABOLIC PANEL
ALT: 12 U/L (ref 0–44)
AST: 12 U/L — ABNORMAL LOW (ref 15–41)
Albumin: 3.8 g/dL (ref 3.5–5.0)
Alkaline Phosphatase: 69 U/L (ref 38–126)
Anion gap: 10 (ref 5–15)
BUN: 20 mg/dL (ref 8–23)
CO2: 23 mmol/L (ref 22–32)
Calcium: 8.7 mg/dL — ABNORMAL LOW (ref 8.9–10.3)
Chloride: 107 mmol/L (ref 98–111)
Creatinine, Ser: 1.25 mg/dL — ABNORMAL HIGH (ref 0.61–1.24)
GFR, Estimated: 57 mL/min — ABNORMAL LOW (ref >60)
Glucose, Bld: 125 mg/dL — ABNORMAL HIGH (ref 70–99)
Potassium: 4.3 mmol/L (ref 3.5–5.1)
Sodium: 140 mmol/L (ref 135–145)
Total Bilirubin: 0.5 mg/dL (ref 0.3–1.2)
Total Protein: 6.9 g/dL (ref 6.5–8.1)

## 2021-06-08 MED ORDER — SODIUM CHLORIDE 0.9 % IV SOLN: Freq: Once | INTRAVENOUS | Status: AC

## 2021-06-08 MED ORDER — SODIUM CHLORIDE 0.9 % IV SOLN
200.0000 mg | Freq: Once | INTRAVENOUS | Status: AC
  Administered 2021-06-08: 200 mg via INTRAVENOUS
  Filled 2021-06-08: qty 8

## 2021-06-08 NOTE — Progress Notes (Signed)
[name] [phone]   [phone]  OFFICE PROGRESS NOTE  [name], [place] [address]  DIAGNOSIS: Stage IV (T3, N2, M1c) non-small cell lung cancer, squamous cell carcinoma presented with large left upper lobe lung mass in addition to left hilar and mediastinal lymphadenopathy as well as bilateral pulmonary nodules and left adrenal gland lesion diagnosed in May 2022.  PRIOR THERAPY: None  CURRENT THERAPY: Palliative systemic chemotherapy with carboplatin for an AUC of 5, paclitaxel 175 mg/m2 and Keytruda 200 mg IV every 3 weeks.  First dose expected on 12/22/2020. Status post 8 cycles.  Starting from cycle #5 the patient will be on maintenance treatment with single agent Keytruda 200 Mg IV every 3 weeks.   INTERVAL HISTORY: Alex Edwards 85 y.o. male returns to the clinic today for follow-up visit accompanied by his wife.  The patient is feeling fine except for the persistent peripheral neuropathy that started after her systemic chemotherapy and continued with Keytruda.  He also has some itching and was given prescription for hydroxyzine by his primary care physician with some improvement of the itching.  He denied having any chest pain, shortness of breath, cough or hemoptysis.  He denied having any nausea, vomiting, diarrhea or constipation.  He has no headache or visual changes.  He has no significant weight loss or night sweats.  MEDICAL HISTORY: Past Medical History:  Diagnosis Date   BCE (basal cell epithelioma)    Bruises easily    on hands   Cough    smokers cough or perfurmes   Degeneration macular    right eye   Diabetes mellitus without complication (HCC)    Dyslipidemia    ED (erectile dysfunction)    Hx of adenomatous colonic polyps    Hyperlipidemia    Hypertension    borderline   Obesity    Psoriasis    Seasonal allergies    Smoker    former    ALLERGIES:  is allergic to  paclitaxel, vancomycin, amlodipine, latex, and levothyroxine.  MEDICATIONS:  Current Outpatient Medications  Medication Sig Dispense Refill   Ascorbic Acid (VITAMIN C) 1000 MG tablet Take 1,000 mg by mouth daily.     Cholecalciferol (VITAMIN D-3) 125 MCG (5000 UT) TABS Take 1 tablet by mouth daily.     fexofenadine (ALLEGRA) 180 MG tablet Take 180 mg by mouth as needed.     hydrochlorothiazide (MICROZIDE) 12.5 MG capsule Take 1 capsule (12.5 mg total) by mouth daily. 90 capsule 0   hydrOXYzine (VISTARIL) 25 MG capsule Take 1 capsule (25 mg total) by mouth every 8 (eight) hours as needed. 30 capsule 1   levothyroxine (SYNTHROID) 50 MCG tablet Take 1 tablet (50 mcg total) by mouth daily before breakfast. 30 tablet 1   loratadine (CLARITIN) 10 MG tablet Take 10 mg by mouth daily.     losartan (COZAAR) 100 MG tablet Take 1 tablet (100 mg total) by mouth daily. 90 tablet 1   metoprolol tartrate (LOPRESSOR) 50 MG tablet TAKE ONE TABLET BY MOUTH TWICE DAILY. 180 tablet 0   Multiple Vitamins-Minerals (PRESERVISION AREDS 2) CAPS Take 1 capsule by mouth in the morning and at bedtime.     [place] Patient is to test one time a day DX: E11.9 100 each 4   ONETOUCH VERIO test strip USE TO TEST BLOOD SUGAR ONCE DAILY. 50 strip 2   prochlorperazine (COMPAZINE) 10 MG  tablet Take 1 tablet (10 mg total) by mouth every 6 (six) hours as needed for nausea or vomiting. (Patient not taking: Reported on 05/14/2021) 30 tablet 0   tamsulosin (FLOMAX) 0.4 MG CAPS capsule Take 1 capsule (0.4 mg total) by mouth daily after supper. 30 capsule 3   triamcinolone cream (KENALOG) 0.1 % APPLY TO AFFECTED AREA TWICE A DAY. (Patient taking differently: Apply 1 application topically as needed.) 454 g 0   No current facility-administered medications for this visit.    SURGICAL HISTORY:  Past Surgical History:  Procedure Laterality Date   BRONCHIAL BRUSHINGS  11/18/2020   Procedure: BRONCHIAL BRUSHINGS;   Surgeon: Spero Geralds, MD;  Location: [facility];  Service: Pulmonary;;   BRONCHIAL NEEDLE ASPIRATION BIOPSY  11/18/2020   Procedure: BRONCHIAL NEEDLE ASPIRATION BIOPSIES;  Surgeon: Spero Geralds, MD;  Location: [facility];  Service: Pulmonary;;   BRONCHIAL WASHINGS  11/18/2020   Procedure: BRONCHIAL WASHINGS;  Surgeon: Spero Geralds, MD;  Location: [facility];  Service: Pulmonary;;   CATARACT EXTRACTION, BILATERAL Bilateral 2013   COLONOSCOPY  2005   [facility]   ENDOBRONCHIAL ULTRASOUND N/A 11/18/2020   Procedure: ENDOBRONCHIAL ULTRASOUND;  Surgeon: Spero Geralds, MD;  Location: [facility];  Service: Pulmonary;  Laterality: N/A;   POLYPECTOMY     SKIN CANCER EXCISION Right 1993   under eye-MOHS, freeze multiple places freq   TONSILLECTOMY  as child   TOTAL HIP ARTHROPLASTY Left 01/04/2014   Procedure: LEFT TOTAL HIP ARTHROPLASTY ANTERIOR APPROACH;  Surgeon: Mcarthur Rossetti, MD;  Location: [facility];  Service: Orthopedics;  Laterality: Left;   TOTAL HIP ARTHROPLASTY Left 02/14/2014   Procedure: Irrigation and Debridement left hip;  Surgeon: Mcarthur Rossetti, MD;  Location: [facility];  Service: Orthopedics;  Laterality: Left;    REVIEW OF SYSTEMS:  A comprehensive review of systems was negative except for: Constitutional: positive for fatigue Integument/breast: positive for pruritus Neurological: positive for paresthesia   PHYSICAL EXAMINATION: General appearance: alert, cooperative, fatigued, and no distress Head: Normocephalic, without obvious abnormality, atraumatic Neck: no adenopathy, no JVD, supple, symmetrical, trachea midline, and thyroid not enlarged, symmetric, no tenderness/mass/nodules Lymph nodes: Cervical, supraclavicular, and axillary nodes normal. Resp: clear to auscultation bilaterally Back: symmetric, no curvature. ROM normal. No CVA tenderness. Cardio: regular rate and rhythm, S1, S2 normal, no murmur, click, rub or gallop GI: soft, non-tender; bowel  sounds normal; no masses,  no organomegaly Extremities: extremities normal, atraumatic, no cyanosis or edema  ECOG PERFORMANCE STATUS: 1 - Symptomatic but completely ambulatory  Blood pressure 138/61, pulse 88, temperature 97.7 F (36.5 C), temperature source Tympanic, resp. rate (!) 21, height 6' 0.75" (1.848 m), weight 248 lb 1.6 oz (112.5 kg), SpO2 97 %.  LABORATORY DATA: Lab Results  Component Value Date   WBC 6.7 06/08/2021   HGB 12.0 (L) 06/08/2021   HCT 34.5 (L) 06/08/2021   MCV 93.2 06/08/2021   PLT 175 06/08/2021      Chemistry      Component Value Date/Time   NA 138 05/18/2021 1010   NA 141 01/29/2020 1157   K 4.4 05/18/2021 1010   CL 105 05/18/2021 1010   CO2 25 05/18/2021 1010   BUN 22 05/18/2021 1010   BUN 12 01/29/2020 1157   CREATININE 1.16 05/18/2021 1010   CREATININE 1.04 03/17/2021 1047   CREATININE 1.11 07/26/2016 0921      Component Value Date/Time   CALCIUM 9.0 05/18/2021 1010   ALKPHOS 67 05/18/2021 1010   AST  17 05/18/2021 1010   AST 13 (L) 03/17/2021 1047   ALT 15 05/18/2021 1010   ALT 8 03/17/2021 1047   BILITOT 0.4 05/18/2021 1010   BILITOT 0.5 03/17/2021 1047       RADIOGRAPHIC STUDIES: No results found.  ASSESSMENT AND PLAN: This is a very pleasant 85 years old white male recently diagnosed with Stage IV (T3, N2, M1c) non-small cell lung cancer, squamous cell carcinoma presented with large left upper lobe lung mass in addition to left hilar and mediastinal lymphadenopathy as well as bilateral pulmonary nodules and left adrenal gland lesion diagnosed in May 2022. The patient is currently undergoing systemic chemotherapy with carboplatin for AUC of 5, paclitaxel 175 Mg/M2 and Keytruda 200 Mg IV every 3 weeks with Neulasta support status post 8 cycles.  Starting from cycle #5 the patient is on maintenance treatment with Keytruda 200 Mg IV every 3 weeks. The patient continues to tolerate this treatment well with no concerning adverse effect  except for the itching and peripheral neuropathy. He was a started on hydroxyzine by his primary care physician with some improvement of the itching. For the peripheral neuropathy, I gave the patient the option of treatment with Neurontin but he declined.  I advised him to use over-the-counter PEA. I will see him back for follow-up visit in 3 weeks for evaluation with repeat CT scan of the chest, abdomen and pelvis for restaging of his disease. The patient was advised to call immediately if he has any other concerning symptoms in the interval. All questions were answered. The patient knows to call the clinic with any problems, questions or concerns. We can certainly see the patient much sooner if necessary.  Disclaimer: This note was dictated with voice recognition software. Similar sounding words can inadvertently be transcribed and may not be corrected upon review.

## 2021-06-08 NOTE — Patient Instructions (Signed)
[facility]  Discharge Instructions: Thank you for choosing [facility] to provide your oncology and hematology care.   If you have a lab appointment with the [facility], please go directly to the [facility] and check in at the registration area.   Wear comfortable clothing and clothing appropriate for easy access to any Portacath or PICC line.   We strive to give you quality time with your provider. You may need to reschedule your appointment if you arrive late (15 or more minutes).  Arriving late affects you and other patients whose appointments are after yours.  Also, if you miss three or more appointments without notifying the office, you may be dismissed from the clinic at the provider's discretion.      For prescription refill requests, have your pharmacy contact our office and allow 72 hours for refills to be completed.    Today you received the following chemotherapy and/or immunotherapy agents: Beryle Flock      To help prevent nausea and vomiting after your treatment, we encourage you to take your nausea medication as directed.  BELOW ARE SYMPTOMS THAT SHOULD BE REPORTED IMMEDIATELY: *FEVER GREATER THAN 100.4 F (38 C) OR HIGHER *CHILLS OR SWEATING *NAUSEA AND VOMITING THAT IS NOT CONTROLLED WITH YOUR NAUSEA MEDICATION *UNUSUAL SHORTNESS OF BREATH *UNUSUAL BRUISING OR BLEEDING *URINARY PROBLEMS (pain or burning when urinating, or frequent urination) *BOWEL PROBLEMS (unusual diarrhea, constipation, pain near the anus) TENDERNESS IN MOUTH AND THROAT WITH OR WITHOUT PRESENCE OF ULCERS (sore throat, sores in mouth, or a toothache) UNUSUAL RASH, SWELLING OR PAIN  UNUSUAL VAGINAL DISCHARGE OR ITCHING   Items with * indicate a potential emergency and should be followed up as soon as possible or go to the Emergency Department if any problems should occur.  Please show the CHEMOTHERAPY ALERT CARD or IMMUNOTHERAPY ALERT CARD at check-in to  the Emergency Department and triage nurse.  Should you have questions after your visit or need to cancel or reschedule your appointment, please contact [facility]  Dept: [phone]  and follow the prompts.  Office hours are 8:00 a.m. to 4:30 p.m. Monday - Friday. Please note that voicemails left after 4:00 p.m. may not be returned until the following business day.  We are closed weekends and major holidays. You have access to a nurse at all times for urgent questions. Please call the main number to the clinic Dept: [phone] and follow the prompts.   For any non-urgent questions, you may also contact your provider using MyChart. We now offer e-Visits for anyone 73 and older to request care online for non-urgent symptoms. For details visit [email or web address].   Also download the MyChart app! Go to the app store, search "MyChart", open the app, select [facility], and log in with your MyChart username and password.  Due to Covid, a mask is required upon entering the hospital/clinic. If you do not have a mask, one will be given to you upon arrival. For doctor visits, patients may have 1 support person aged 20 or older with them. For treatment visits, patients cannot have anyone with them due to current Covid guidelines and our immunocompromised population.

## 2021-06-14 ENCOUNTER — Other Ambulatory Visit: Payer: Self-pay

## 2021-06-19 NOTE — Progress Notes (Signed)
[facility] OFFICE PROGRESS NOTE  Alex Edwards, Alex Edwards [id number]  DIAGNOSIS: Stage IV (T3, N2, M1c) non-small cell Edwards cancer, squamous cell carcinoma presented with large left upper lobe Edwards mass in addition to left hilar and mediastinal lymphadenopathy as well as bilateral pulmonary nodules and left adrenal gland lesion diagnosed in May 2022.  PRIOR THERAPY: None  CURRENT THERAPY: Palliative systemic chemotherapy with carboplatin for an AUC of 5, paclitaxel 175 mg/m2 and Keytruda 200 mg IV every 3 weeks.  First dose expected on 12/22/2020. Status post 9 cycles.  Starting from cycle #5 the patient will be on maintenance treatment with single agent Keytruda 200 Mg IV every 3 weeks.  INTERVAL HISTORY: Alex Edwards Sr 85 y.o. male returns to the clinic today for a follow-up visit accompanied by his wife.   He feels well overall but continues to have peripheral neuropathy and itching.  He is wondering if he can take a break from [place].  He states that the peripheral neuropathy started after his chemotherapy.  Of note, the patient is no longer on paclitaxel.  Additionally, the patient does have diabetes but is currently not taking any medications for it.  He is not interested in taking gabapentin.    The patient has itching which bothers him at nighttime.  He has a history of psoriasis.  He denies any itching during the daytime.  He takes Atarax around 10:00 at night.  He states he wakes up at 1 AM in the morning with itching.  He has prescription for Kenalog cream but has not used it.  He denies any new lotions, soaps, or detergents.  Denies any fever or chills.  He reports night sweats approximately once per week which is been ongoing.  He states that his breathing/shortness of breath is "pretty good".  He has not needed to use his supplemental oxygen lately.  Denies any chest pain or unusual cough.  Denies any hemoptysis.  He denies any nausea,  vomiting, diarrhea, or constipation.  He denies any headache or visual changes besides his baseline visual changes from macular degeneration.  He also states that he is aware that he is overdue for an annual eye exam due to his diabetes. He recently had a restaging CT scan performed.  The patient is here today for evaluation and to review his scan results before starting cycle #10.  MEDICAL HISTORY: Past Medical History:  Diagnosis Date   BCE (basal cell epithelioma)    Bruises easily    on hands   Cough    smokers cough or perfurmes   Degeneration macular    right eye   Diabetes mellitus without complication (HCC)    Dyslipidemia    ED (erectile dysfunction)    Hx of adenomatous colonic polyps    Hyperlipidemia    Hypertension    borderline   Obesity    Psoriasis    Seasonal allergies    Smoker    former    ALLERGIES:  is allergic to paclitaxel, vancomycin, amlodipine, and latex.  MEDICATIONS:  Current Outpatient Medications  Medication Sig Dispense Refill   Ascorbic Acid (VITAMIN C) 1000 MG tablet Take 1,000 mg by mouth daily.     Cholecalciferol (VITAMIN D-3) 125 MCG (5000 UT) TABS Take 1 tablet by mouth daily.     fexofenadine (ALLEGRA) 180 MG tablet Take 180 mg by mouth as needed.     hydrochlorothiazide (MICROZIDE) 12.5 MG capsule Take 1 capsule (12.5  mg total) by mouth daily. 90 capsule 0   hydrOXYzine (VISTARIL) 25 MG capsule Take 1 capsule (25 mg total) by mouth every 8 (eight) hours as needed. 30 capsule 1   levothyroxine (SYNTHROID) 50 MCG tablet Take 1 tablet (50 mcg total) by mouth daily before breakfast. 30 tablet 1   loratadine (CLARITIN) 10 MG tablet Take 10 mg by mouth daily.     losartan (COZAAR) 100 MG tablet Take 1 tablet (100 mg total) by mouth daily. 90 tablet 1   metoprolol tartrate (LOPRESSOR) 50 MG tablet TAKE ONE TABLET BY MOUTH TWICE DAILY 180 tablet 0   Multiple Vitamins-Minerals (PRESERVISION AREDS 2) CAPS Take 1 capsule by mouth in the morning and  at bedtime.     [place] Patient is to test one time a day DX: E11.9 100 each 4   ONETOUCH VERIO test strip USE TO TEST BLOOD SUGAR ONCE DAILY. 50 strip 2   prochlorperazine (COMPAZINE) 10 MG tablet Take 1 tablet (10 mg total) by mouth every 6 (six) hours as needed for nausea or vomiting. 30 tablet 0   tamsulosin (FLOMAX) 0.4 MG CAPS capsule Take 1 capsule (0.4 mg total) by mouth daily after supper. 30 capsule 3   triamcinolone cream (KENALOG) 0.1 % APPLY TO AFFECTED AREA TWICE A DAY. (Patient taking differently: Apply 1 application topically as needed.) 454 g 0   No current facility-administered medications for this visit.    SURGICAL HISTORY:  Past Surgical History:  Procedure Laterality Date   BRONCHIAL BRUSHINGS  11/18/2020   Procedure: BRONCHIAL BRUSHINGS;  Surgeon: Spero Geralds, MD;  Location: [facility];  Service: Pulmonary;;   BRONCHIAL NEEDLE ASPIRATION BIOPSY  11/18/2020   Procedure: BRONCHIAL NEEDLE ASPIRATION BIOPSIES;  Surgeon: Spero Geralds, MD;  Location: [facility];  Service: Pulmonary;;   BRONCHIAL WASHINGS  11/18/2020   Procedure: BRONCHIAL WASHINGS;  Surgeon: Spero Geralds, MD;  Location: [facility];  Service: Pulmonary;;   CATARACT EXTRACTION, BILATERAL Bilateral 2013   COLONOSCOPY  2005   [facility]   ENDOBRONCHIAL ULTRASOUND N/A 11/18/2020   Procedure: ENDOBRONCHIAL ULTRASOUND;  Surgeon: Spero Geralds, MD;  Location: [facility];  Service: Pulmonary;  Laterality: N/A;   POLYPECTOMY     SKIN CANCER EXCISION Right 1993   under eye-MOHS, freeze multiple places freq   TONSILLECTOMY  as child   TOTAL HIP ARTHROPLASTY Left 01/04/2014   Procedure: LEFT TOTAL HIP ARTHROPLASTY ANTERIOR APPROACH;  Surgeon: Mcarthur Rossetti, MD;  Location: [facility];  Service: Orthopedics;  Laterality: Left;   TOTAL HIP ARTHROPLASTY Left 02/14/2014   Procedure: Irrigation and Debridement left hip;  Surgeon: Mcarthur Rossetti, MD;  Location: [facility];   Service: Orthopedics;  Laterality: Left;    REVIEW OF SYSTEMS:   Review of Systems  Constitutional: Negative for appetite change, chills, fatigue, fever and unexpected weight change.  HENT: Negative for mouth sores, nosebleeds, sore throat and trouble swallowing.   Eyes: Negative for eye problems and icterus.  Respiratory: Positive for baseline dyspnea on exertion. Negative for hemoptysis and wheezing.   Cardiovascular: Negative for chest pain and leg swelling.  Gastrointestinal: Negative for abdominal pain, constipation, diarrhea, nausea and vomiting.  Genitourinary: Negative for bladder incontinence, difficulty urinating, dysuria, frequency and hematuria.   Musculoskeletal: Negative for back pain, gait problem, neck pain and neck stiffness.  Skin: Positive for dry skin/itching.  Positive for rash. Neurological: Positive for peripheral neuropathy.  Negative for dizziness, extremity weakness, gait problem, headaches, light-headedness and  seizures.  Hematological: Negative for adenopathy. Does not bruise/bleed easily.  Psychiatric/Behavioral: Negative for confusion, depression and sleep disturbance. The patient is not nervous/anxious.   PHYSICAL EXAMINATION:  Blood pressure (!) 144/61, pulse 82, temperature 98.1 F (36.7 C), temperature source Temporal, resp. rate 18, height 6' 0.75" (1.848 m), weight 250 lb 6.4 oz (113.6 kg), SpO2 96 %.  ECOG PERFORMANCE STATUS: 1  Physical Exam  Constitutional: Oriented to person, place, and time and well-developed, well-nourished, and in no distress.  HENT:  Head: Normocephalic and atraumatic.  Mouth/Throat: Oropharynx is clear and moist. No oropharyngeal exudate.  Eyes: Conjunctivae are normal. Right eye exhibits no discharge. Left eye exhibits no discharge. No scleral icterus.  Neck: Normal range of motion. Neck supple.  Cardiovascular: Normal rate, regular rhythm, normal heart sounds and intact distal pulses.   Pulmonary/Chest: Effort normal and  breath sounds normal. No respiratory distress. No wheezes. No rales.  Abdominal: Soft. Bowel sounds are normal. Exhibits no distension and no mass. There is no tenderness.  Musculoskeletal: Normal range of motion. Exhibits no edema.  Lymphadenopathy:    No cervical adenopathy.  Neurological: Alert and oriented to person, place, and time. Exhibits normal muscle tone. Gait normal. Coordination normal.  Skin: Skin is warm and dry.  Positive for rash. Not diaphoretic. No erythema. No pallor.  Psychiatric: Mood, memory and judgment normal.  Vitals reviewed.  LABORATORY DATA: Lab Results  Component Value Date   WBC 6.2 06/30/2021   HGB 11.9 (L) 06/30/2021   HCT 34.4 (L) 06/30/2021   MCV 91.7 06/30/2021   PLT 181 06/30/2021      Chemistry      Component Value Date/Time   NA 138 06/30/2021 1431   NA 141 01/29/2020 1157   K 4.6 06/30/2021 1431   CL 106 06/30/2021 1431   CO2 26 06/30/2021 1431   BUN 26 (H) 06/30/2021 1431   BUN 12 01/29/2020 1157   CREATININE 1.45 (H) 06/30/2021 1431   CREATININE 1.04 03/17/2021 1047   CREATININE 1.11 07/26/2016 0921      Component Value Date/Time   CALCIUM 9.3 06/30/2021 1431   ALKPHOS 68 06/30/2021 1431   AST 14 (L) 06/30/2021 1431   AST 13 (L) 03/17/2021 1047   ALT 12 06/30/2021 1431   ALT 8 03/17/2021 1047   BILITOT 0.4 06/30/2021 1431   BILITOT 0.5 03/17/2021 1047       RADIOGRAPHIC STUDIES:  CT Chest W Contrast  Result Date: 06/28/2021 CLINICAL DATA:  Left upper lobe Edwards cancer restaging EXAM: CT CHEST, ABDOMEN, AND PELVIS WITH CONTRAST TECHNIQUE: Multidetector CT imaging of the chest, abdomen and pelvis was performed following the standard protocol during bolus administration of intravenous contrast. CONTRAST:  71mL OMNIPAQUE IOHEXOL 350 MG/ML SOLN, additional oral enteric contrast COMPARISON:  CT chest abdomen pelvis, 04/29/2021, PET-CT, 12/05/2020 FINDINGS: CT CHEST FINDINGS Cardiovascular: Aortic atherosclerosis. Aortic valve  calcifications. Normal heart size. Three-vessel coronary artery calcifications. No pericardial effusion. Mediastinum/Nodes: No enlarged mediastinal, hilar, or axillary lymph nodes. Thyroid gland, trachea, and esophagus demonstrate no significant findings. Lungs/Pleura: Mild centrilobular and paraseptal emphysema. Diffuse bilateral bronchial wall thickening. Continued slight interval decrease in size of a partially cavitary, spiculated subpleural mass of the peripheral left upper lobe measuring 3.4 x 2.8 cm, previously 3.7 x 3.2 cm (series 4, image 66). Unchanged irregular subpleural opacity of the posterior right apex measuring 1.2 x 1.2 cm (series 4, image 32). Numerous additional centrilobular and tree-in-bud pulmonary nodules, particularly in the posterior right upper lobe (series 4,  image 77) and right Edwards base (series 4, image 132), not significantly changed. Interval increase in size and solid character of a subpleural nodule of the posterior right upper lobe measuring 0.8 x 0.5 cm, previously of ground-glass composition measuring no greater than 0.5 cm (series 4, image 83). No pleural effusion or pneumothorax. Musculoskeletal: No chest wall mass or suspicious bone lesions identified. CT ABDOMEN PELVIS FINDINGS Hepatobiliary: No solid liver abnormality is seen. No gallstones, gallbladder wall thickening, or biliary dilatation. Pancreas: Unremarkable. No pancreatic ductal dilatation or surrounding inflammatory changes. Spleen: Normal in size without significant abnormality. Adrenals/Urinary Tract: Unchanged nodule of the lateral limb of the left adrenal gland measuring 1.7 x 1.5 cm (series 2, image 66). Kidneys are normal, without renal calculi, solid lesion, or hydronephrosis. Multiple small calculi in the urinary bladder. Stomach/Bowel: Stomach is within normal limits. Appendix appears normal. No evidence of bowel wall thickening, distention, or inflammatory changes. Descending and sigmoid diverticulosis.  Vascular/Lymphatic: Aortic atherosclerosis. No enlarged abdominal or pelvic lymph nodes. Reproductive: Prostatomegaly. Other: No abdominal wall hernia or abnormality. No abdominopelvic ascites. Musculoskeletal: No acute or significant osseous findings. Status post left hip total arthroplasty. IMPRESSION: 1. Continued slight interval decrease in size of a partially cavitary, spiculated subpleural mass of the peripheral left upper lobe, consistent with continued treatment response. 2. Unchanged irregular subpleural opacity of the posterior right apex, which remains nonspecific. Attention on follow-up. 3. Interval increase in size and solid character of a subpleural nodule of the posterior right upper lobe measuring 0.8 x 0.5 cm, previously of ground-glass composition measuring no greater than 0.5 cm. This is modestly suspicious for pulmonary metastatic disease, however infectious or inflammatory etiology is generally favored. Attention on follow-up. 4. Numerous additional centrilobular and tree-in-bud pulmonary nodules, particularly in the posterior right upper lobe and right Edwards base, not significantly changed and consistent with atypical mycobacterial infection, without specific evidence of ongoing infection. 5. Unchanged nodule of the lateral limb of the left adrenal gland, previously with equivocal FDG avidity, statistically likely a benign adenoma although metastasis not excluded. Continued attention on follow-up. 6. Prostatomegaly. Multiple small calculi in the urinary bladder. 7. Emphysema. 8. Coronary artery disease. 9. Aortic valve calcifications. Correlate for echocardiographic evidence of aortic valve dysfunction. Aortic Atherosclerosis (ICD10-I70.0) and Emphysema (ICD10-J43.9). Electronically Signed   By: Delanna Ahmadi M.D.   On: 06/28/2021 13:18   CT Abdomen Pelvis W Contrast  Result Date: 06/28/2021 CLINICAL DATA:  Left upper lobe Edwards cancer restaging EXAM: CT CHEST, ABDOMEN, AND PELVIS WITH  CONTRAST TECHNIQUE: Multidetector CT imaging of the chest, abdomen and pelvis was performed following the standard protocol during bolus administration of intravenous contrast. CONTRAST:  28mL OMNIPAQUE IOHEXOL 350 MG/ML SOLN, additional oral enteric contrast COMPARISON:  CT chest abdomen pelvis, 04/29/2021, PET-CT, 12/05/2020 FINDINGS: CT CHEST FINDINGS Cardiovascular: Aortic atherosclerosis. Aortic valve calcifications. Normal heart size. Three-vessel coronary artery calcifications. No pericardial effusion. Mediastinum/Nodes: No enlarged mediastinal, hilar, or axillary lymph nodes. Thyroid gland, trachea, and esophagus demonstrate no significant findings. Lungs/Pleura: Mild centrilobular and paraseptal emphysema. Diffuse bilateral bronchial wall thickening. Continued slight interval decrease in size of a partially cavitary, spiculated subpleural mass of the peripheral left upper lobe measuring 3.4 x 2.8 cm, previously 3.7 x 3.2 cm (series 4, image 66). Unchanged irregular subpleural opacity of the posterior right apex measuring 1.2 x 1.2 cm (series 4, image 32). Numerous additional centrilobular and tree-in-bud pulmonary nodules, particularly in the posterior right upper lobe (series 4, image 77) and right Edwards base (series 4, image  132), not significantly changed. Interval increase in size and solid character of a subpleural nodule of the posterior right upper lobe measuring 0.8 x 0.5 cm, previously of ground-glass composition measuring no greater than 0.5 cm (series 4, image 83). No pleural effusion or pneumothorax. Musculoskeletal: No chest wall mass or suspicious bone lesions identified. CT ABDOMEN PELVIS FINDINGS Hepatobiliary: No solid liver abnormality is seen. No gallstones, gallbladder wall thickening, or biliary dilatation. Pancreas: Unremarkable. No pancreatic ductal dilatation or surrounding inflammatory changes. Spleen: Normal in size without significant abnormality. Adrenals/Urinary Tract: Unchanged  nodule of the lateral limb of the left adrenal gland measuring 1.7 x 1.5 cm (series 2, image 66). Kidneys are normal, without renal calculi, solid lesion, or hydronephrosis. Multiple small calculi in the urinary bladder. Stomach/Bowel: Stomach is within normal limits. Appendix appears normal. No evidence of bowel wall thickening, distention, or inflammatory changes. Descending and sigmoid diverticulosis. Vascular/Lymphatic: Aortic atherosclerosis. No enlarged abdominal or pelvic lymph nodes. Reproductive: Prostatomegaly. Other: No abdominal wall hernia or abnormality. No abdominopelvic ascites. Musculoskeletal: No acute or significant osseous findings. Status post left hip total arthroplasty. IMPRESSION: 1. Continued slight interval decrease in size of a partially cavitary, spiculated subpleural mass of the peripheral left upper lobe, consistent with continued treatment response. 2. Unchanged irregular subpleural opacity of the posterior right apex, which remains nonspecific. Attention on follow-up. 3. Interval increase in size and solid character of a subpleural nodule of the posterior right upper lobe measuring 0.8 x 0.5 cm, previously of ground-glass composition measuring no greater than 0.5 cm. This is modestly suspicious for pulmonary metastatic disease, however infectious or inflammatory etiology is generally favored. Attention on follow-up. 4. Numerous additional centrilobular and tree-in-bud pulmonary nodules, particularly in the posterior right upper lobe and right Edwards base, not significantly changed and consistent with atypical mycobacterial infection, without specific evidence of ongoing infection. 5. Unchanged nodule of the lateral limb of the left adrenal gland, previously with equivocal FDG avidity, statistically likely a benign adenoma although metastasis not excluded. Continued attention on follow-up. 6. Prostatomegaly. Multiple small calculi in the urinary bladder. 7. Emphysema. 8. Coronary artery  disease. 9. Aortic valve calcifications. Correlate for echocardiographic evidence of aortic valve dysfunction. Aortic Atherosclerosis (ICD10-I70.0) and Emphysema (ICD10-J43.9). Electronically Signed   By: Delanna Ahmadi M.D.   On: 06/28/2021 13:18     ASSESSMENT/PLAN:  This is a very pleasant 85 year old Caucasian male diagnosed with stage IV (T3, N2, M1 C) non-small cell Edwards cancer, squamous cell carcinoma.  He presented with a large left upper lobe Edwards mass in addition to left hilar mediastinal lymphadenopathy, scattered bilateral pulmonary nodules, and a small lesion in the left adrenal gland suspicious for metastatic disease.  The patient was diagnosed in May 2022.   The patient is currently undergoing palliative systemic chemotherapy with carboplatin for an AUC of 5, paclitaxel 175 mg per metered squared, Keytruda 200 mg IV every 3 weeks with Neulasta support.  He is status post 9 cycle and tolerated it well.  Starting from cycle #5, the patient has been on single agent immunotherapy with Keytruda.  The patient recently had a restaging CT scan performed.  Alex Edwards personally and independently reviewed the scan and discussed the results with the patient today.  The scan showed no evidence of disease progression.  The patient does have some nonspecific subpleural opacity in the right apex, which may be infectious or inflammatory.   There is also numerous additional central lobar and tree-in-bud pulmonary nodules.  The patient is asymptomatic.  The patient is wondering if he can undergo a break from treatment due to the itching and peripheral neuropathy.  Alex Edwards discussed that peripheral neuropathy is not a common side effect from [facility] which may be related to his diabetes as well as his prior treatment with Taxol.  The patient is not interested in starting gabapentin at this time.  Alex Edwards also discussed clinical trials have shown that PEA is effective.  The patient will consider this.   Regarding the itching, discussed symptomatic treatment including Atarax 3 times daily if needed and Kenalog cream.    Alex Edwards gave the patient the option of taking a break from treatment with a restaging CT scan 3 months versus continuing with treatment with symptomatic treatment of his itching.  The patient understands that continue on observation may cause disease progression in the interval.    The patient would like to take a break from treatment with a restaging CT scan of the chest, abdomen, and pelvis in 3 months.  I will cancel his infusion today.  We will see him back for follow-up visit in 3 months for evaluation and repeat CT scan of the chest, abdomen, and pelvis.  Strongly encouraged the patient to follow-up with his primary care provider regarding his diabetes as well as his annual eye exams.   The patient was advised to call immediately if he has any concerning symptoms in the interval. The patient voices understanding of current disease status and treatment options and is in agreement with the current care plan. All questions were answered. The patient knows to call the clinic with any problems, questions or concerns. We can certainly see the patient much sooner if necessary   Orders Placed This Encounter  Procedures   CT Chest W Contrast    Standing Status:   Future    Standing Expiration Date:   06/30/2022    Order Specific Question:   If indicated for the ordered procedure, I authorize the administration of contrast media per Radiology protocol    Answer:   Yes    Order Specific Question:   Preferred imaging location?    Answer:   [facility]   CT Abdomen Pelvis W Contrast    Standing Status:   Future    Standing Expiration Date:   06/30/2022    Order Specific Question:   If indicated for the ordered procedure, I authorize the administration of contrast media per Radiology protocol    Answer:   Yes    Order Specific Question:   Preferred imaging location?     Answer:   [facility]    Order Specific Question:   Is Oral Contrast requested for this exam?    Answer:   Yes, Per Radiology protocol   CBC with Differential ([place] Only)    Standing Status:   Future    Standing Expiration Date:   06/30/2022   CMP ([place] only)    Standing Status:   Future    Standing Expiration Date:   06/30/2022   TSH    Standing Status:   Future    Standing Expiration Date:   06/30/2022      Socorro Kanitz L Akeem Heppler, PA-C 06/30/21  ADDENDUM: Hematology/Oncology Attending: I had a face-to-face encounter with the patient today.  I reviewed his record, lab, scan and recommended his care plan.  This is a very pleasant 85 years old white male with history of stage IV (T3, N2, M1 C) non-small cell Edwards  cancer, squamous cell carcinoma diagnosed in May 2022.  He is status post induction systemic chemotherapy with carboplatin, paclitaxel and Keytruda for 4 cycles and currently on maintenance treatment with single agent Keytruda every 3 weeks for 5 more cycles.  The patient has been tolerating the treatment well except for peripheral neuropathy as well as itching.  He has a history of diabetes mellitus as well as mild psoriasis which may have been contributing to his symptoms but he thinks that his symptoms got worse after starting the treatment with immunotherapy. The patient had repeat CT scan of the chest, abdomen pelvis performed recently.  I personally and independently reviewed the scans and discussed the results with the patient and his wife. His scan showed no concerning findings for disease progression and there was further decrease in the size of the left upper lobe nodule.  He also has some inflammatory process in the Edwards that need close monitoring on the upcoming imaging studies. I had a lengthy discussion with the patient and his wife today about his condition and treatment options.  He was giving the option of continuing his maintenance treatment  with Keytruda and symptomatic treatment of his neuropathy and itching but he is interested in taking a break of treatment to see if his symptoms will get better. I will arrange for the patient to come back for follow-up visit in 3 months with repeat CT scan of the chest, abdomen and pelvis for restaging of his disease. He was advised to call immediately if he has any other concerning symptoms in the interval.  The total time spent in the appointment was 30 minutes. Disclaimer: This note was dictated with voice recognition software. Similar sounding words can inadvertently be transcribed and may be missed upon review. Eilleen Kempf, MD 06/30/21

## 2021-06-24 DIAGNOSIS — J9601 Acute respiratory failure with hypoxia: Secondary | ICD-10-CM | POA: Diagnosis not present

## 2021-06-26 ENCOUNTER — Other Ambulatory Visit: Payer: Self-pay

## 2021-06-26 ENCOUNTER — Ambulatory Visit (HOSPITAL_COMMUNITY)
Admission: RE | Admit: 2021-06-26 | Discharge: 2021-06-26 | Disposition: A | Discharge status: Home / Self Care | Payer: Medicare Other | Source: Ambulatory Visit | Attending: Internal Medicine | Admitting: Internal Medicine

## 2021-06-26 DIAGNOSIS — R918 Other nonspecific abnormal finding of lung field: Secondary | ICD-10-CM | POA: Diagnosis not present

## 2021-06-26 DIAGNOSIS — J439 Emphysema, unspecified: Secondary | ICD-10-CM | POA: Diagnosis not present

## 2021-06-26 DIAGNOSIS — C3490 Malignant neoplasm of unspecified part of unspecified bronchus or lung: Secondary | ICD-10-CM | POA: Diagnosis not present

## 2021-06-26 DIAGNOSIS — K5730 Diverticulosis of large intestine without perforation or abscess without bleeding: Secondary | ICD-10-CM | POA: Diagnosis not present

## 2021-06-26 DIAGNOSIS — N210 Calculus in bladder: Secondary | ICD-10-CM | POA: Diagnosis not present

## 2021-06-26 DIAGNOSIS — R911 Solitary pulmonary nodule: Secondary | ICD-10-CM | POA: Diagnosis not present

## 2021-06-26 DIAGNOSIS — I700 Atherosclerosis of aorta: Secondary | ICD-10-CM | POA: Diagnosis not present

## 2021-06-26 MED ORDER — IOHEXOL 350 MG/ML SOLN
75.0000 mL | Freq: Once | INTRAVENOUS | Status: AC | PRN
  Administered 2021-06-26: 12:00:00 75 mL via INTRAVENOUS

## 2021-06-26 MED ORDER — SODIUM CHLORIDE (PF) 0.9 % IJ SOLN
INTRAMUSCULAR | Status: AC
  Filled 2021-06-26: qty 50

## 2021-06-29 ENCOUNTER — Other Ambulatory Visit: Payer: Self-pay | Admitting: Family Medicine

## 2021-06-29 DIAGNOSIS — E1159 Type 2 diabetes mellitus with other circulatory complications: Secondary | ICD-10-CM

## 2021-06-29 DIAGNOSIS — I10 Essential (primary) hypertension: Secondary | ICD-10-CM

## 2021-06-30 ENCOUNTER — Inpatient Hospital Stay: Payer: Medicare Other | Attending: Internal Medicine

## 2021-06-30 ENCOUNTER — Inpatient Hospital Stay (HOSPITAL_BASED_OUTPATIENT_CLINIC_OR_DEPARTMENT_OTHER): Payer: Medicare Other | Admitting: Physician Assistant

## 2021-06-30 ENCOUNTER — Inpatient Hospital Stay: Payer: Medicare Other

## 2021-06-30 ENCOUNTER — Other Ambulatory Visit: Payer: Self-pay

## 2021-06-30 VITALS — BP 144/61 | HR 82 | Temp 98.1°F | Resp 18 | Ht 72.75 in | Wt 250.4 lb

## 2021-06-30 DIAGNOSIS — I700 Atherosclerosis of aorta: Secondary | ICD-10-CM | POA: Insufficient documentation

## 2021-06-30 DIAGNOSIS — K5730 Diverticulosis of large intestine without perforation or abscess without bleeding: Secondary | ICD-10-CM | POA: Insufficient documentation

## 2021-06-30 DIAGNOSIS — E785 Hyperlipidemia, unspecified: Secondary | ICD-10-CM | POA: Diagnosis not present

## 2021-06-30 DIAGNOSIS — N210 Calculus in bladder: Secondary | ICD-10-CM | POA: Insufficient documentation

## 2021-06-30 DIAGNOSIS — Z5111 Encounter for antineoplastic chemotherapy: Secondary | ICD-10-CM

## 2021-06-30 DIAGNOSIS — Z87891 Personal history of nicotine dependence: Secondary | ICD-10-CM | POA: Diagnosis not present

## 2021-06-30 DIAGNOSIS — C3412 Malignant neoplasm of upper lobe, left bronchus or lung: Secondary | ICD-10-CM | POA: Insufficient documentation

## 2021-06-30 DIAGNOSIS — I2510 Atherosclerotic heart disease of native coronary artery without angina pectoris: Secondary | ICD-10-CM | POA: Diagnosis not present

## 2021-06-30 DIAGNOSIS — N400 Enlarged prostate without lower urinary tract symptoms: Secondary | ICD-10-CM | POA: Diagnosis not present

## 2021-06-30 DIAGNOSIS — G629 Polyneuropathy, unspecified: Secondary | ICD-10-CM | POA: Insufficient documentation

## 2021-06-30 DIAGNOSIS — Z79899 Other long term (current) drug therapy: Secondary | ICD-10-CM | POA: Diagnosis not present

## 2021-06-30 DIAGNOSIS — C3492 Malignant neoplasm of unspecified part of left bronchus or lung: Secondary | ICD-10-CM | POA: Diagnosis not present

## 2021-06-30 DIAGNOSIS — J432 Centrilobular emphysema: Secondary | ICD-10-CM | POA: Diagnosis not present

## 2021-06-30 DIAGNOSIS — I10 Essential (primary) hypertension: Secondary | ICD-10-CM | POA: Diagnosis not present

## 2021-06-30 DIAGNOSIS — E119 Type 2 diabetes mellitus without complications: Secondary | ICD-10-CM | POA: Insufficient documentation

## 2021-06-30 DIAGNOSIS — Z5112 Encounter for antineoplastic immunotherapy: Secondary | ICD-10-CM

## 2021-06-30 DIAGNOSIS — C3490 Malignant neoplasm of unspecified part of unspecified bronchus or lung: Secondary | ICD-10-CM

## 2021-06-30 DIAGNOSIS — L299 Pruritus, unspecified: Secondary | ICD-10-CM | POA: Diagnosis not present

## 2021-06-30 LAB — CBC WITH DIFFERENTIAL (CANCER CENTER ONLY)
Abs Immature Granulocytes: 0.01 10*3/uL (ref 0.00–0.07)
Basophils Absolute: 0 10*3/uL (ref 0.0–0.1)
Basophils Relative: 0 %
Eosinophils Absolute: 0.4 10*3/uL (ref 0.0–0.5)
Eosinophils Relative: 6 %
HCT: 34.4 % — ABNORMAL LOW (ref 39.0–52.0)
Hemoglobin: 11.9 g/dL — ABNORMAL LOW (ref 13.0–17.0)
Immature Granulocytes: 0 %
Lymphocytes Relative: 25 %
Lymphs Abs: 1.6 10*3/uL (ref 0.7–4.0)
MCH: 31.7 pg (ref 26.0–34.0)
MCHC: 34.6 g/dL (ref 30.0–36.0)
MCV: 91.7 fL (ref 80.0–100.0)
Monocytes Absolute: 0.8 10*3/uL (ref 0.1–1.0)
Monocytes Relative: 12 %
Neutro Abs: 3.5 10*3/uL (ref 1.7–7.7)
Neutrophils Relative %: 57 %
Platelet Count: 181 10*3/uL (ref 150–400)
RBC: 3.75 MIL/uL — ABNORMAL LOW (ref 4.22–5.81)
RDW: 11.9 % (ref 11.5–15.5)
WBC Count: 6.2 10*3/uL (ref 4.0–10.5)
nRBC: 0 % (ref 0.0–0.2)

## 2021-06-30 LAB — COMPREHENSIVE METABOLIC PANEL
ALT: 12 U/L (ref 0–44)
AST: 14 U/L — ABNORMAL LOW (ref 15–41)
Albumin: 4.1 g/dL (ref 3.5–5.0)
Alkaline Phosphatase: 68 U/L (ref 38–126)
Anion gap: 6 (ref 5–15)
BUN: 26 mg/dL — ABNORMAL HIGH (ref 8–23)
CO2: 26 mmol/L (ref 22–32)
Calcium: 9.3 mg/dL (ref 8.9–10.3)
Chloride: 106 mmol/L (ref 98–111)
Creatinine, Ser: 1.45 mg/dL — ABNORMAL HIGH (ref 0.61–1.24)
GFR, Estimated: 48 mL/min — ABNORMAL LOW (ref >60)
Glucose, Bld: 99 mg/dL (ref 70–99)
Potassium: 4.6 mmol/L (ref 3.5–5.1)
Sodium: 138 mmol/L (ref 135–145)
Total Bilirubin: 0.4 mg/dL (ref 0.3–1.2)
Total Protein: 7 g/dL (ref 6.5–8.1)

## 2021-06-30 LAB — TSH: TSH: 4.293 u[IU]/mL — ABNORMAL HIGH (ref 0.320–4.118)

## 2021-07-09 ENCOUNTER — Telehealth: Payer: Self-pay | Admitting: Internal Medicine

## 2021-07-09 NOTE — Telephone Encounter (Signed)
Sch per 1/3 los, pt aware

## 2021-07-19 ENCOUNTER — Other Ambulatory Visit: Payer: Self-pay | Admitting: Family Medicine

## 2021-07-20 ENCOUNTER — Other Ambulatory Visit: Payer: Medicare Other

## 2021-07-20 ENCOUNTER — Ambulatory Visit: Payer: Medicare Other

## 2021-07-20 ENCOUNTER — Ambulatory Visit: Payer: Medicare Other | Admitting: Physician Assistant

## 2021-07-25 DIAGNOSIS — J9601 Acute respiratory failure with hypoxia: Secondary | ICD-10-CM | POA: Diagnosis not present

## 2021-08-07 ENCOUNTER — Other Ambulatory Visit: Payer: Self-pay

## 2021-08-07 DIAGNOSIS — E039 Hypothyroidism, unspecified: Secondary | ICD-10-CM

## 2021-08-07 MED ORDER — LEVOTHYROXINE SODIUM 50 MCG PO TABS: 50.0000 ug | ORAL_TABLET | Freq: Every day | ORAL | 0 refills | Status: DC

## 2021-08-07 NOTE — Telephone Encounter (Signed)
Pt states he has ongoing itching and joint pain. He declines taking Atarax TID and the Kenalog cream. Pt states it doesn't work.  Pt is requesting a rx for prednisone.

## 2021-08-10 ENCOUNTER — Other Ambulatory Visit: Payer: Self-pay | Admitting: Physician Assistant

## 2021-08-10 ENCOUNTER — Ambulatory Visit: Payer: Medicare Other | Admitting: Internal Medicine

## 2021-08-10 ENCOUNTER — Ambulatory Visit: Payer: Medicare Other

## 2021-08-10 ENCOUNTER — Other Ambulatory Visit: Payer: Medicare Other

## 2021-08-10 DIAGNOSIS — L299 Pruritus, unspecified: Secondary | ICD-10-CM

## 2021-08-10 DIAGNOSIS — E039 Hypothyroidism, unspecified: Secondary | ICD-10-CM

## 2021-08-10 MED ORDER — METHYLPREDNISOLONE 4 MG PO TBPK: ORAL_TABLET | ORAL | 0 refills | Status: DC

## 2021-08-10 MED ORDER — LEVOTHYROXINE SODIUM 50 MCG PO TABS: 50.0000 ug | ORAL_TABLET | Freq: Every day | ORAL | 2 refills | Status: DC

## 2021-08-14 ENCOUNTER — Encounter: Payer: Self-pay | Admitting: Internal Medicine

## 2021-08-14 NOTE — Telephone Encounter (Signed)
Pt requested a rx for Prednisone for his itching. Rx was sent.

## 2021-08-21 ENCOUNTER — Ambulatory Visit (INDEPENDENT_AMBULATORY_CARE_PROVIDER_SITE_OTHER): Payer: Medicare Other

## 2021-08-21 VITALS — Ht 73.0 in | Wt 243.0 lb

## 2021-08-21 DIAGNOSIS — Z0000 Encounter for general adult medical examination without abnormal findings: Secondary | ICD-10-CM | POA: Diagnosis not present

## 2021-08-21 NOTE — Progress Notes (Addendum)
I connected with Alex Edwards today by telephone and verified that I am speaking with the correct person using two identifiers. Location patient: home Location provider: work Persons participating in the virtual visit: Alex Edwards, Glenna Durand LPN.   I discussed the limitations, risks, security and privacy concerns of performing an evaluation and management service by telephone and the availability of in person appointments. I also discussed with the patient that there may be a patient responsible charge related to this service. The patient expressed understanding and verbally consented to this telephonic visit.    Interactive audio and video telecommunications were attempted between this provider and patient, however failed, due to patient having technical difficulties OR patient did not have access to video capability.  We continued and completed visit with audio only.     Vital signs may be patient reported or missing.  Subjective:   Alex Edwards is a 86 y.o. male who presents for Medicare Annual/Subsequent preventive examination.  Review of Systems     Cardiac Risk Factors include: advanced age (>75men, >52 women);diabetes mellitus;dyslipidemia;hypertension;male gender;obesity (BMI >30kg/m2)     Objective:    Today's Vitals   08/21/21 1457  Weight: 243 lb (110.2 kg)  Height: 6\' 1"  (1.854 m)   Body mass index is 32.06 kg/m.  Advanced Directives 08/21/2021 06/08/2021 06/08/2021 05/18/2021 04/06/2021 03/17/2021 02/23/2021  Does Patient Have a Medical Advance Directive? No No Yes Yes Yes No No  Type of Advance Directive - Public librarian;Living will - - - -  Does patient want to make changes to medical advance directive? - No - Patient declined No - Patient declined - No - Patient declined - -  Copy of Page in Chart? - - No - copy requested - - - -  Would patient like information on creating a medical advance directive? - - - - - No -  Patient declined No - Patient declined  Pre-existing out of facility DNR order (yellow form or pink MOST form) - - - - - - -    Current Medications (verified) Outpatient Encounter Medications as of 08/21/2021  Medication Sig   Ascorbic Acid (VITAMIN C) 1000 MG tablet Take 1,000 mg by mouth daily.   Cholecalciferol (VITAMIN D-3) 125 MCG (5000 UT) TABS Take 1 tablet by mouth daily.   fexofenadine (ALLEGRA) 180 MG tablet Take 180 mg by mouth as needed.   hydrochlorothiazide (MICROZIDE) 12.5 MG capsule Take 1 capsule (12.5 mg total) by mouth daily.   hydrOXYzine (VISTARIL) 25 MG capsule Take 1 capsule (25 mg total) by mouth every 8 (eight) hours as needed.   levothyroxine (SYNTHROID) 50 MCG tablet Take 1 tablet (50 mcg total) by mouth daily before breakfast.   loratadine (CLARITIN) 10 MG tablet Take 10 mg by mouth daily.   metoprolol tartrate (LOPRESSOR) 50 MG tablet TAKE ONE TABLET BY MOUTH TWICE DAILY   Multiple Vitamins-Minerals (PRESERVISION AREDS 2) CAPS Take 1 capsule by mouth in the morning and at bedtime.   Brilliant Patient is to test one time a day DX: E11.9   ONETOUCH VERIO test strip USE TO TEST BLOOD SUGAR ONCE DAILY.   prochlorperazine (COMPAZINE) 10 MG tablet Take 1 tablet (10 mg total) by mouth every 6 (six) hours as needed for nausea or vomiting.   tamsulosin (FLOMAX) 0.4 MG CAPS capsule Take 1 capsule (0.4 mg total) by mouth daily after supper.   triamcinolone cream (KENALOG) 0.1 % APPLY TO  AFFECTED AREA TWICE A DAY. (Patient taking differently: Apply 1 application topically as needed.)   losartan (COZAAR) 100 MG tablet Take 1 tablet (100 mg total) by mouth daily.   methylPREDNISolone (MEDROL DOSEPAK) 4 MG TBPK tablet Use as instructed (Patient not taking: Reported on 08/21/2021)   No facility-administered encounter medications on file as of 08/21/2021.    Allergies (verified) Paclitaxel, Vancomycin, Amlodipine, and Latex   History: Past Medical  History:  Diagnosis Date   BCE (basal cell epithelioma)    Bruises easily    on hands   Cough    smokers cough or perfurmes   Degeneration macular    right eye   Diabetes mellitus without complication (HCC)    Dyslipidemia    ED (erectile dysfunction)    Hx of adenomatous colonic polyps    Hyperlipidemia    Hypertension    borderline   Obesity    Psoriasis    Seasonal allergies    Smoker    former   Past Surgical History:  Procedure Laterality Date   BRONCHIAL BRUSHINGS  11/18/2020   Procedure: BRONCHIAL BRUSHINGS;  Surgeon: Spero Geralds, MD;  Location: [facility];  Service: Pulmonary;;   BRONCHIAL NEEDLE ASPIRATION BIOPSY  11/18/2020   Procedure: BRONCHIAL NEEDLE ASPIRATION BIOPSIES;  Surgeon: Spero Geralds, MD;  Location: [facility];  Service: Pulmonary;;   BRONCHIAL WASHINGS  11/18/2020   Procedure: BRONCHIAL WASHINGS;  Surgeon: Spero Geralds, MD;  Location: [facility];  Service: Pulmonary;;   CATARACT EXTRACTION, BILATERAL Bilateral 2013   COLONOSCOPY  2005   [facility]   ENDOBRONCHIAL ULTRASOUND N/A 11/18/2020   Procedure: ENDOBRONCHIAL ULTRASOUND;  Surgeon: Spero Geralds, MD;  Location: [facility];  Service: Pulmonary;  Laterality: N/A;   POLYPECTOMY     SKIN CANCER EXCISION Right 1993   under eye-MOHS, freeze multiple places freq   TONSILLECTOMY  as child   TOTAL HIP ARTHROPLASTY Left 01/04/2014   Procedure: LEFT TOTAL HIP ARTHROPLASTY ANTERIOR APPROACH;  Surgeon: Mcarthur Rossetti, MD;  Location: [facility];  Service: Orthopedics;  Laterality: Left;   TOTAL HIP ARTHROPLASTY Left 02/14/2014   Procedure: Irrigation and Debridement left hip;  Surgeon: Mcarthur Rossetti, MD;  Location: [facility];  Service: Orthopedics;  Laterality: Left;   Family History  Problem Relation Age of Onset   Heart disease Brother    Social History   Socioeconomic History   Marital status: Married    Spouse name: Not on file   Number of children: Not on file   Years of  education: Not on file   Highest education level: Not on file  Occupational History   Not on file  Tobacco Use   Smoking status: Former    Packs/day: 1.00    Years: 50.00    Pack years: 50.00    Types: Cigarettes    Quit date: 04/28/2014    Years since quitting: 7.3   Smokeless tobacco: Never  Vaping Use   Vaping Use: Never used  Substance and Sexual Activity   Alcohol use: No    Alcohol/week: 0.0 standard drinks   Drug use: No   Sexual activity: Yes  Other Topics Concern   Not on file  Social History Narrative   Not on file   Social Determinants of Health   Financial Resource Strain: Medium Risk   Difficulty of Paying Living Expenses: Somewhat hard  Food Insecurity: No Food Insecurity   Worried About Running Out of Food in the Last Year: Never true  Ran Out of Food in the Last Year: Never true  Transportation Needs: No Transportation Needs   Lack of Transportation (Medical): No   Lack of Transportation (Non-Medical): No  Physical Activity: Insufficiently Active   Days of Exercise per Week: 1 day   Minutes of Exercise per Session: 30 min  Stress: No Stress Concern Present   Feeling of Stress : Not at all  Social Connections: Not on file    Tobacco Counseling Counseling given: Not Answered   Clinical Intake:  Pre-visit preparation completed: Yes  Pain : No/denies pain     Nutritional Status: BMI > 30  Obese Nutritional Risks: None Diabetes: Yes  How often do you need to have someone help you when you read instructions, pamphlets, or other written materials from your doctor or pharmacy?: 1 - Never  Diabetic? Yes Nutrition Risk Assessment:  Has the patient had any N/V/D within the last 2 months?  No  Does the patient have any non-healing wounds?  No  Has the patient had any unintentional weight loss or weight gain?  No   Diabetes:  Is the patient diabetic?  Yes  If diabetic, was a CBG obtained today?  No  Did the patient bring in their glucometer  from home?  No  How often do you monitor your CBG's? daily.   Financial Strains and Diabetes Management:  Are you having any financial strains with the device, your supplies or your medication? No .  Does the patient want to be seen by Chronic Care Management for management of their diabetes?  No  Would the patient like to be referred to a Nutritionist or for Diabetic Management?  No   Diabetic Exams:  Diabetic Eye Exam: Overdue for diabetic eye exam. Pt has been advised about the importance in completing this exam. Patient advised to call and schedule an eye exam. Diabetic Foot Exam: Completed 06/01/2021   Interpreter Needed?: No  Information entered by :: NAllen LPN   Activities of Daily Living In your present state of health, do you have any difficulty performing the following activities: 08/21/2021 08/20/2021  Hearing? N N  Vision? N N  Difficulty concentrating or making decisions? N N  Walking or climbing stairs? N Y  Dressing or bathing? N N  Doing errands, shopping? N N  Preparing Food and eating ? N N  Using the Toilet? N N  In the past six months, have you accidently leaked urine? N N  Do you have problems with loss of bowel control? N N  Managing your Medications? N N  Managing your Finances? N N  Housekeeping or managing your Housekeeping? N N  Some recent data might be hidden    Patient Care Team: [name], MD as PCP - General (Family Medicine) Valrie Hart, RN as Oncology Nurse Navigator (Oncology) Viona Gilmore, [facility] as Pharmacist (Pharmacist)  Indicate any recent Medical Services you may have received from other than [facility] providers in the past year (date may be approximate).     Assessment:   This is a routine wellness examination for Alex Edwards.  Hearing/Vision screen Vision Screening - Comments:: No regular eye exams  Dietary issues and exercise activities discussed: Current Exercise Habits: Home exercise routine, Type of exercise: walking,  Time (Minutes): 30, Frequency (Times/Week): 1, Weekly Exercise (Minutes/Week): 30   Goals Addressed             This Visit's Progress    Patient Stated  08/21/2021, no goals       Depression Screen PHQ 2/9 Scores 08/21/2021 01/29/2021 01/29/2020 01/24/2019 01/31/2018 07/26/2016 12/09/2014  PHQ - 2 Score 0 0 0 0 0 0 0    Fall Risk Fall Risk  08/21/2021 08/20/2021 01/29/2021 01/29/2020 01/24/2019  Falls in the past year? 0 0 0 0 0  Number falls in past yr: - - 0 - -  Injury with Fall? - - 0 - -  Risk for fall due to : Medication side effect - No Fall Risks No Fall Risks -  Follow up Falls evaluation completed;Education provided;Falls prevention discussed - Falls evaluation completed - -    FALL RISK PREVENTION PERTAINING TO THE HOME:  Any stairs in or around the home? Yes  If so, are there any without handrails? No  Home free of loose throw rugs in walkways, pet beds, electrical cords, etc? Yes  Adequate lighting in your home to reduce risk of falls? Yes   ASSISTIVE DEVICES UTILIZED TO PREVENT FALLS:  Life alert? No  Use of a cane, walker or w/c? No  Grab bars in the bathroom? No  Shower chair or bench in shower? No  Elevated toilet seat or a handicapped toilet? No   TIMED UP AND GO:  Was the test performed? No .      Cognitive Function:        Immunizations Immunization History  Administered Date(s) Administered   Fluad Quad(high Dose 65+) 04/10/2019, 04/09/2020, 04/14/2021   Influenza Split 04/22/2009, 07/02/2010, 05/12/2011, 03/28/2012   Influenza, High Dose Seasonal PF 04/02/2013, 04/23/2014, 04/09/2015, 03/16/2016, 03/29/2017, 04/17/2018   PFIZER Comirnaty(Gray Top)Covid-19 Tri-Sucrose Vaccine 01/29/2021   PFIZER(Purple Top)SARS-COV-2 Vaccination 08/24/2019, 09/18/2019, 05/28/2020   Pneumococcal Conjugate-13 04/23/2014   Pneumococcal Polysaccharide-23 03/29/2005   Td 03/29/2005   Tdap 02/11/2012    TDAP status: Up to date  Flu Vaccine status: Up to  date  Pneumococcal vaccine status: Up to date  Covid-19 vaccine status: Completed vaccines  Qualifies for Shingles Vaccine? Yes   Zostavax completed No   Shingrix Completed?: No.    Education has been provided regarding the importance of this vaccine. Patient has been advised to call insurance company to determine out of pocket expense if they have not yet received this vaccine. Advised may also receive vaccine at local pharmacy or Health Dept. Verbalized acceptance and understanding.  Screening Tests Health Maintenance  Topic Date Due   OPHTHALMOLOGY EXAM  Never done   COVID-19 Vaccine (5 - Booster for Pfizer series) 09/06/2021 (Originally 03/26/2021)   HEMOGLOBIN A1C  11/30/2021   TETANUS/TDAP  02/10/2022   FOOT EXAM  06/01/2022   URINE MICROALBUMIN  06/01/2022   Pneumonia Vaccine 48+ Years old  Completed   INFLUENZA VACCINE  Completed   HPV VACCINES  Aged Out   COLONOSCOPY (Pts 45-36yrs Insurance coverage will need to be confirmed)  Discontinued   Zoster Vaccines- Shingrix  Discontinued    Health Maintenance  Health Maintenance Due  Topic Date Due   OPHTHALMOLOGY EXAM  Never done    Colorectal cancer screening: No longer required.   Lung Cancer Screening: (Low Dose CT Chest recommended if Age 22-80 years, 30 pack-year currently smoking OR have quit w/in 15years.) does not qualify.   Lung Cancer Screening Referral: no  Additional Screening:  Hepatitis C Screening: does not qualify;   Vision Screening: Recommended annual ophthalmology exams for early detection of glaucoma and other disorders of the eye. Is the patient up to date with their annual eye exam?  No  Who is the provider or what is the name of the office in which the patient attends annual eye exams? [facility] If pt is not established with a provider, would they like to be referred to a provider to establish care? No .   Dental Screening: Recommended annual dental exams for proper oral  hygiene  Community Resource Referral / Chronic Care Management: CRR required this visit?  No   CCM required this visit?  No      Plan:     I have personally reviewed and noted the following in the patients chart:   Medical and social history Use of alcohol, tobacco or illicit drugs  Current medications and supplements including opioid prescriptions. Patient is not currently taking opioid prescriptions. Functional ability and status Nutritional status Physical activity Advanced directives List of other physicians Hospitalizations, surgeries, and ER visits in previous 12 months Vitals Screenings to include cognitive, depression, and falls Referrals and appointments  In addition, I have reviewed and discussed with patient certain preventive protocols, quality metrics, and best practice recommendations. A written personalized care plan for preventive services as well as general preventive health recommendations were provided to patient.     Kellie Simmering, LPN   [id number]   Nurse Notes: 6 CIT not administered. Patient has normal cognition per conversation.  Due to this being a virtual visit, the after visit summary with patients personalized plan was offered to patient via mail or my-chart. Patient would like to access on my-chart

## 2021-08-21 NOTE — Patient Instructions (Signed)
[name] , Thank you for taking time to come for your Medicare Wellness Visit. I appreciate your ongoing commitment to your health goals. Please review the following plan we discussed and let me know if I can assist you in the future.   Screening recommendations/referrals: Colonoscopy: not required Recommended yearly ophthalmology/optometry visit for glaucoma screening and checkup Recommended yearly dental visit for hygiene and checkup  Vaccinations: Influenza vaccine: completed 04/14/2021, due next flu season Pneumococcal vaccine: completed 04/23/2014 Tdap vaccine: completed 02/11/2012, due 02/10/2022 Shingles vaccine: discussed   Covid-19:  01/29/2021, 05/28/2020, 09/18/2019, 08/24/2019  Advanced directives: Advance directive discussed with you today. Even though you declined this today please call our office should you change your mind and we can give you the proper paperwork for you to fill out.  Conditions/risks identified: none  Next appointment: Follow up in one year for your annual wellness visit.   Preventive Care 67 Years and Older, Male Preventive care refers to lifestyle choices and visits with your health care provider that can promote health and wellness. What does preventive care include? A yearly physical exam. This is also called an annual well check. Dental exams once or twice a year. Routine eye exams. Ask your health care provider how often you should have your eyes checked. Personal lifestyle choices, including: Daily care of your teeth and gums. Regular physical activity. Eating a healthy diet. Avoiding tobacco and drug use. Limiting alcohol use. Practicing safe sex. Taking low doses of aspirin every day. Taking vitamin and mineral supplements as recommended by your health care provider. What happens during an annual well check? The services and screenings done by your health care provider during your annual well check will depend on your age, overall health,  lifestyle risk factors, and family history of disease. Counseling  Your health care provider may ask you questions about your: Alcohol use. Tobacco use. Drug use. Emotional well-being. Home and relationship well-being. Sexual activity. Eating habits. History of falls. Memory and ability to understand (cognition). Work and work Statistician. Screening  You may have the following tests or measurements: Height, weight, and BMI. Blood pressure. Lipid and cholesterol levels. These may be checked every 5 years, or more frequently if you are over 84 years old. Skin check. Lung cancer screening. You may have this screening every year starting at age 76 if you have a 30-pack-year history of smoking and currently smoke or have quit within the past 15 years. Fecal occult blood test (FOBT) of the stool. You may have this test every year starting at age 69. Flexible sigmoidoscopy or colonoscopy. You may have a sigmoidoscopy every 5 years or a colonoscopy every 10 years starting at age 30. Prostate cancer screening. Recommendations will vary depending on your family history and other risks. Hepatitis C blood test. Hepatitis B blood test. Sexually transmitted disease (STD) testing. Diabetes screening. This is done by checking your blood sugar (glucose) after you have not eaten for a while (fasting). You may have this done every 1-3 years. Abdominal aortic aneurysm (AAA) screening. You may need this if you are a current or former smoker. Osteoporosis. You may be screened starting at age 6 if you are at high risk. Talk with your health care provider about your test results, treatment options, and if necessary, the need for more tests. Vaccines  Your health care provider may recommend certain vaccines, such as: Influenza vaccine. This is recommended every year. Tetanus, diphtheria, and acellular pertussis (Tdap, Td) vaccine. You may need a Td  booster every 10 years. Zoster vaccine. You may need this  after age 61. Pneumococcal 13-valent conjugate (PCV13) vaccine. One dose is recommended after age 82. Pneumococcal polysaccharide (PPSV23) vaccine. One dose is recommended after age 76. Talk to your health care provider about which screenings and vaccines you need and how often you need them. This information is not intended to replace advice given to you by your health care provider. Make sure you discuss any questions you have with your health care provider. Document Released: 07/11/2015 Document Revised: 03/03/2016 Document Reviewed: 04/15/2015 Elsevier Interactive Patient Education  2017 [place] Prevention in the Home Falls can cause injuries. They can happen to people of all ages. There are many things you can do to make your home safe and to help prevent falls. What can I do on the outside of my home? Regularly fix the edges of walkways and driveways and fix any cracks. Remove anything that might make you trip as you walk through a door, such as a raised step or threshold. Trim any bushes or trees on the path to your home. Use bright outdoor lighting. Clear any walking paths of anything that might make someone trip, such as rocks or tools. Regularly check to see if handrails are loose or broken. Make sure that both sides of any steps have handrails. Any raised decks and porches should have guardrails on the edges. Have any leaves, snow, or ice cleared regularly. Use sand or salt on walking paths during winter. Clean up any spills in your garage right away. This includes oil or grease spills. What can I do in the bathroom? Use night lights. Install grab bars by the toilet and in the tub and shower. Do not use towel bars as grab bars. Use non-skid mats or decals in the tub or shower. If you need to sit down in the shower, use a plastic, non-slip stool. Keep the floor dry. Clean up any water that spills on the floor as soon as it happens. Remove soap buildup in the tub or  shower regularly. Attach bath mats securely with double-sided non-slip rug tape. Do not have throw rugs and other things on the floor that can make you trip. What can I do in the bedroom? Use night lights. Make sure that you have a light by your bed that is easy to reach. Do not use any sheets or blankets that are too big for your bed. They should not hang down onto the floor. Have a firm chair that has side arms. You can use this for support while you get dressed. Do not have throw rugs and other things on the floor that can make you trip. What can I do in the kitchen? Clean up any spills right away. Avoid walking on wet floors. Keep items that you use a lot in easy-to-reach places. If you need to reach something above you, use a strong step stool that has a grab bar. Keep electrical cords out of the way. Do not use floor polish or wax that makes floors slippery. If you must use wax, use non-skid floor wax. Do not have throw rugs and other things on the floor that can make you trip. What can I do with my stairs? Do not leave any items on the stairs. Make sure that there are handrails on both sides of the stairs and use them. Fix handrails that are broken or loose. Make sure that handrails are as long as the stairways. Check any carpeting  to make sure that it is firmly attached to the stairs. Fix any carpet that is loose or worn. Avoid having throw rugs at the top or bottom of the stairs. If you do have throw rugs, attach them to the floor with carpet tape. Make sure that you have a light switch at the top of the stairs and the bottom of the stairs. If you do not have them, ask someone to add them for you. What else can I do to help prevent falls? Wear shoes that: Do not have high heels. Have rubber bottoms. Are comfortable and fit you well. Are closed at the toe. Do not wear sandals. If you use a stepladder: Make sure that it is fully opened. Do not climb a closed stepladder. Make  sure that both sides of the stepladder are locked into place. Ask someone to hold it for you, if possible. Clearly mark and make sure that you can see: Any grab bars or handrails. First and last steps. Where the edge of each step is. Use tools that help you move around (mobility aids) if they are needed. These include: Canes. Walkers. Scooters. Crutches. Turn on the lights when you go into a dark area. Replace any light bulbs as soon as they burn out. Set up your furniture so you have a clear path. Avoid moving your furniture around. If any of your floors are uneven, fix them. If there are any pets around you, be aware of where they are. Review your medicines with your doctor. Some medicines can make you feel dizzy. This can increase your chance of falling. Ask your doctor what other things that you can do to help prevent falls. This information is not intended to replace advice given to you by your health care provider. Make sure you discuss any questions you have with your health care provider. Document Released: 04/10/2009 Document Revised: 11/20/2015 Document Reviewed: 07/19/2014 Elsevier Interactive Patient Education  2017 [employer].

## 2021-08-25 DIAGNOSIS — J9601 Acute respiratory failure with hypoxia: Secondary | ICD-10-CM | POA: Diagnosis not present

## 2021-09-01 ENCOUNTER — Ambulatory Visit: Payer: Medicare Other

## 2021-09-01 ENCOUNTER — Other Ambulatory Visit: Payer: Medicare Other

## 2021-09-01 ENCOUNTER — Ambulatory Visit: Payer: Medicare Other | Admitting: Physician Assistant

## 2021-09-02 ENCOUNTER — Other Ambulatory Visit: Payer: Self-pay | Admitting: Family Medicine

## 2021-09-02 DIAGNOSIS — R32 Unspecified urinary incontinence: Secondary | ICD-10-CM

## 2021-09-14 ENCOUNTER — Other Ambulatory Visit: Payer: Self-pay | Admitting: Family Medicine

## 2021-09-14 ENCOUNTER — Telehealth: Payer: Self-pay | Admitting: Medical Oncology

## 2021-09-14 DIAGNOSIS — I10 Essential (primary) hypertension: Secondary | ICD-10-CM

## 2021-09-14 DIAGNOSIS — I152 Hypertension secondary to endocrine disorders: Secondary | ICD-10-CM

## 2021-09-14 NOTE — Telephone Encounter (Signed)
Pt wants to schedule Ct scan. I sent message to scheduler. ?

## 2021-09-21 ENCOUNTER — Telehealth: Payer: Self-pay | Admitting: Medical Oncology

## 2021-09-21 NOTE — Telephone Encounter (Signed)
Update itching and joint pain  Medrol dose pack resolved these symptoms until recently his joint pain returned in hands and now has new  numbness in fingertips. ?Last Keytruda in Dec 2022. ? ?Per Cassie I instructed pt to contact PCP for the numbness and joint pain. He voiced  understanding. ?

## 2021-09-22 DIAGNOSIS — J9601 Acute respiratory failure with hypoxia: Secondary | ICD-10-CM | POA: Diagnosis not present

## 2021-09-25 ENCOUNTER — Other Ambulatory Visit: Payer: Self-pay

## 2021-09-25 ENCOUNTER — Inpatient Hospital Stay: Payer: Medicare Other | Attending: Internal Medicine

## 2021-09-25 ENCOUNTER — Ambulatory Visit (HOSPITAL_COMMUNITY)
Admission: RE | Admit: 2021-09-25 | Discharge: 2021-09-25 | Disposition: A | Discharge status: Home / Self Care | Payer: Medicare Other | Source: Ambulatory Visit | Attending: Physician Assistant | Admitting: Physician Assistant

## 2021-09-25 DIAGNOSIS — I700 Atherosclerosis of aorta: Secondary | ICD-10-CM | POA: Insufficient documentation

## 2021-09-25 DIAGNOSIS — N210 Calculus in bladder: Secondary | ICD-10-CM | POA: Diagnosis not present

## 2021-09-25 DIAGNOSIS — Z79899 Other long term (current) drug therapy: Secondary | ICD-10-CM | POA: Diagnosis not present

## 2021-09-25 DIAGNOSIS — J439 Emphysema, unspecified: Secondary | ICD-10-CM | POA: Diagnosis not present

## 2021-09-25 DIAGNOSIS — I2510 Atherosclerotic heart disease of native coronary artery without angina pectoris: Secondary | ICD-10-CM | POA: Diagnosis not present

## 2021-09-25 DIAGNOSIS — K760 Fatty (change of) liver, not elsewhere classified: Secondary | ICD-10-CM | POA: Diagnosis not present

## 2021-09-25 DIAGNOSIS — C3492 Malignant neoplasm of unspecified part of left bronchus or lung: Secondary | ICD-10-CM | POA: Insufficient documentation

## 2021-09-25 DIAGNOSIS — Z87891 Personal history of nicotine dependence: Secondary | ICD-10-CM | POA: Diagnosis not present

## 2021-09-25 DIAGNOSIS — C3412 Malignant neoplasm of upper lobe, left bronchus or lung: Secondary | ICD-10-CM | POA: Diagnosis not present

## 2021-09-25 DIAGNOSIS — R918 Other nonspecific abnormal finding of lung field: Secondary | ICD-10-CM | POA: Diagnosis not present

## 2021-09-25 DIAGNOSIS — C3490 Malignant neoplasm of unspecified part of unspecified bronchus or lung: Secondary | ICD-10-CM | POA: Diagnosis not present

## 2021-09-25 LAB — CMP (CANCER CENTER ONLY)
ALT: 11 U/L (ref 0–44)
AST: 15 U/L (ref 15–41)
Albumin: 4.4 g/dL (ref 3.5–5.0)
Alkaline Phosphatase: 67 U/L (ref 38–126)
Anion gap: 8 (ref 5–15)
BUN: 25 mg/dL — ABNORMAL HIGH (ref 8–23)
CO2: 27 mmol/L (ref 22–32)
Calcium: 10.1 mg/dL (ref 8.9–10.3)
Chloride: 101 mmol/L (ref 98–111)
Creatinine: 1.28 mg/dL — ABNORMAL HIGH (ref 0.61–1.24)
GFR, Estimated: 56 mL/min — ABNORMAL LOW (ref >60)
Glucose, Bld: 88 mg/dL (ref 70–99)
Potassium: 4.7 mmol/L (ref 3.5–5.1)
Sodium: 136 mmol/L (ref 135–145)
Total Bilirubin: 0.4 mg/dL (ref 0.3–1.2)
Total Protein: 8 g/dL (ref 6.5–8.1)

## 2021-09-25 LAB — CBC WITH DIFFERENTIAL (CANCER CENTER ONLY)
Abs Immature Granulocytes: 0.02 10*3/uL (ref 0.00–0.07)
Basophils Absolute: 0 10*3/uL (ref 0.0–0.1)
Basophils Relative: 0 %
Eosinophils Absolute: 0.2 10*3/uL (ref 0.0–0.5)
Eosinophils Relative: 2 %
HCT: 38.6 % — ABNORMAL LOW (ref 39.0–52.0)
Hemoglobin: 12.8 g/dL — ABNORMAL LOW (ref 13.0–17.0)
Immature Granulocytes: 0 %
Lymphocytes Relative: 24 %
Lymphs Abs: 1.8 10*3/uL (ref 0.7–4.0)
MCH: 30.9 pg (ref 26.0–34.0)
MCHC: 33.2 g/dL (ref 30.0–36.0)
MCV: 93.2 fL (ref 80.0–100.0)
Monocytes Absolute: 0.8 10*3/uL (ref 0.1–1.0)
Monocytes Relative: 10 %
Neutro Abs: 4.7 10*3/uL (ref 1.7–7.7)
Neutrophils Relative %: 64 %
Platelet Count: 209 10*3/uL (ref 150–400)
RBC: 4.14 MIL/uL — ABNORMAL LOW (ref 4.22–5.81)
RDW: 12.3 % (ref 11.5–15.5)
WBC Count: 7.5 10*3/uL (ref 4.0–10.5)
nRBC: 0 % (ref 0.0–0.2)

## 2021-09-25 LAB — TSH: TSH: 3.742 u[IU]/mL (ref 0.320–4.118)

## 2021-09-25 MED ORDER — IOHEXOL 300 MG/ML  SOLN
100.0000 mL | Freq: Once | INTRAMUSCULAR | Status: AC | PRN
  Administered 2021-09-25: 100 mL via INTRAVENOUS

## 2021-09-29 ENCOUNTER — Other Ambulatory Visit: Payer: Self-pay

## 2021-09-29 ENCOUNTER — Ambulatory Visit (INDEPENDENT_AMBULATORY_CARE_PROVIDER_SITE_OTHER): Payer: Medicare Other | Admitting: Family Medicine

## 2021-09-29 ENCOUNTER — Encounter: Payer: Self-pay | Admitting: Internal Medicine

## 2021-09-29 ENCOUNTER — Inpatient Hospital Stay: Payer: Medicare Other | Attending: Internal Medicine | Admitting: Internal Medicine

## 2021-09-29 ENCOUNTER — Encounter: Payer: Self-pay | Admitting: Family Medicine

## 2021-09-29 ENCOUNTER — Ambulatory Visit: Payer: Medicare Other | Admitting: Family Medicine

## 2021-09-29 VITALS — BP 110/66 | HR 78 | Temp 98.2°F | Wt 245.8 lb

## 2021-09-29 VITALS — BP 133/66 | HR 96 | Temp 97.7°F | Resp 18 | Wt 245.6 lb

## 2021-09-29 DIAGNOSIS — E785 Hyperlipidemia, unspecified: Secondary | ICD-10-CM | POA: Diagnosis not present

## 2021-09-29 DIAGNOSIS — G629 Polyneuropathy, unspecified: Secondary | ICD-10-CM | POA: Insufficient documentation

## 2021-09-29 DIAGNOSIS — Z85828 Personal history of other malignant neoplasm of skin: Secondary | ICD-10-CM | POA: Insufficient documentation

## 2021-09-29 DIAGNOSIS — C3412 Malignant neoplasm of upper lobe, left bronchus or lung: Secondary | ICD-10-CM | POA: Insufficient documentation

## 2021-09-29 DIAGNOSIS — C3490 Malignant neoplasm of unspecified part of unspecified bronchus or lung: Secondary | ICD-10-CM | POA: Diagnosis not present

## 2021-09-29 DIAGNOSIS — I10 Essential (primary) hypertension: Secondary | ICD-10-CM | POA: Diagnosis not present

## 2021-09-29 DIAGNOSIS — K760 Fatty (change of) liver, not elsewhere classified: Secondary | ICD-10-CM | POA: Diagnosis not present

## 2021-09-29 DIAGNOSIS — L299 Pruritus, unspecified: Secondary | ICD-10-CM | POA: Insufficient documentation

## 2021-09-29 DIAGNOSIS — I700 Atherosclerosis of aorta: Secondary | ICD-10-CM | POA: Insufficient documentation

## 2021-09-29 DIAGNOSIS — K5730 Diverticulosis of large intestine without perforation or abscess without bleeding: Secondary | ICD-10-CM | POA: Diagnosis not present

## 2021-09-29 DIAGNOSIS — M1990 Unspecified osteoarthritis, unspecified site: Secondary | ICD-10-CM

## 2021-09-29 DIAGNOSIS — C7972 Secondary malignant neoplasm of left adrenal gland: Secondary | ICD-10-CM | POA: Diagnosis not present

## 2021-09-29 DIAGNOSIS — C3492 Malignant neoplasm of unspecified part of left bronchus or lung: Secondary | ICD-10-CM

## 2021-09-29 DIAGNOSIS — I2510 Atherosclerotic heart disease of native coronary artery without angina pectoris: Secondary | ICD-10-CM | POA: Insufficient documentation

## 2021-09-29 DIAGNOSIS — Z79899 Other long term (current) drug therapy: Secondary | ICD-10-CM | POA: Diagnosis not present

## 2021-09-29 DIAGNOSIS — E1142 Type 2 diabetes mellitus with diabetic polyneuropathy: Secondary | ICD-10-CM | POA: Insufficient documentation

## 2021-09-29 DIAGNOSIS — J432 Centrilobular emphysema: Secondary | ICD-10-CM | POA: Diagnosis not present

## 2021-09-29 DIAGNOSIS — C7800 Secondary malignant neoplasm of unspecified lung: Secondary | ICD-10-CM | POA: Diagnosis not present

## 2021-09-29 DIAGNOSIS — M25519 Pain in unspecified shoulder: Secondary | ICD-10-CM | POA: Diagnosis not present

## 2021-09-29 DIAGNOSIS — Z86010 Personal history of colonic polyps: Secondary | ICD-10-CM | POA: Insufficient documentation

## 2021-09-29 MED ORDER — PIROXICAM 20 MG PO CAPS: 20.0000 mg | ORAL_CAPSULE | Freq: Every day | ORAL | 0 refills | Status: DC

## 2021-09-29 NOTE — Progress Notes (Signed)
?    [facility] ?[phone]   [phone] ? ?OFFICE PROGRESS NOTE ? ?[name], MD ?[address] ?[place] [address] ? ?DIAGNOSIS: Stage IV (T3, N2, M1c) non-small cell lung cancer, squamous cell carcinoma presented with large left upper lobe lung mass in addition to left hilar and mediastinal lymphadenopathy as well as bilateral pulmonary nodules and left adrenal gland lesion diagnosed in May 2022. ? ?PRIOR THERAPY: Palliative systemic chemotherapy with carboplatin for an AUC of 5, paclitaxel 175 mg/m2 and Keytruda 200 mg IV every 3 weeks.  First dose expected on 12/22/2020. Status post 9 cycles.  Starting from cycle #5 the patient will be on maintenance treatment with single agent Keytruda 200 Mg IV every 3 weeks.  This treatment is currently on hold secondary to intolerance with significant itching and neuropathy. ? ?CURRENT THERAPY: Observation ?  ?INTERVAL HISTORY: ?Alex Edwards 86 y.o. male returns to the clinic today for follow-up visit accompanied by his wife.  The patient is feeling fine today with no concerning complaints except for the arthralgia in his shoulder as well as the peripheral neuropathy in his fingers.  The itching has significantly improved after treatment with prednisone.  He denied having any current chest pain, shortness of breath, cough or hemoptysis.  He denied having any fever or chills.  He has no nausea, vomiting, diarrhea or constipation.  He has no headache or visual changes.  He has been in observation for the last 3 months with no treatment.  He had repeat CT scan of the chest, abdomen pelvis performed recently and he is here for evaluation and discussion of his scan results. ? ?MEDICAL HISTORY: ?Past Medical History:  ?Diagnosis Date  ? BCE (basal cell epithelioma)   ? Bruises easily   ? on hands  ? Cough   ? smokers cough or perfurmes  ? Degeneration macular   ? right eye  ? Diabetes mellitus without complication ([place])   ?  Dyslipidemia   ? ED (erectile dysfunction)   ? Hx of adenomatous colonic polyps   ? Hyperlipidemia   ? Hypertension   ? borderline  ? Obesity   ? Psoriasis   ? Seasonal allergies   ? Smoker   ? former  ? ? ?ALLERGIES:  is allergic to paclitaxel, vancomycin, amlodipine, and latex. ? ?MEDICATIONS:  ?Current Outpatient Medications  ?Medication Sig Dispense Refill  ? Ascorbic Acid (VITAMIN C) 1000 MG tablet Take 1,000 mg by mouth daily.    ? Cholecalciferol (VITAMIN D-3) 125 MCG (5000 UT) TABS Take 1 tablet by mouth daily.    ? fexofenadine (ALLEGRA) 180 MG tablet Take 180 mg by mouth as needed.    ? hydrochlorothiazide (MICROZIDE) 12.5 MG capsule Take 1 capsule (12.5 mg total) by mouth daily. 90 capsule 0  ? hydrOXYzine (VISTARIL) 25 MG capsule Take 1 capsule (25 mg total) by mouth every 8 (eight) hours as needed. 30 capsule 1  ? levothyroxine (SYNTHROID) 50 MCG tablet Take 1 tablet (50 mcg total) by mouth daily before breakfast. 30 tablet 2  ? loratadine (CLARITIN) 10 MG tablet Take 10 mg by mouth daily.    ? losartan (COZAAR) 100 MG tablet Take 1 tablet (100 mg total) by mouth daily. 90 tablet 1  ? metoprolol tartrate (LOPRESSOR) 50 MG tablet TAKE ONE TABLET BY MOUTH TWICE DAILY 180 tablet 0  ? Multiple Vitamins-Minerals (PRESERVISION AREDS 2) CAPS Take 1 capsule by mouth in the morning and at bedtime.    ?  [place] Patient is to test one time a day DX: E11.9 100 each 4  ? ONETOUCH VERIO test strip USE TO TEST BLOOD SUGAR ONCE DAILY. 50 strip 3  ? prochlorperazine (COMPAZINE) 10 MG tablet Take 1 tablet (10 mg total) by mouth every 6 (six) hours as needed for nausea or vomiting. 30 tablet 0  ? tamsulosin (FLOMAX) 0.4 MG CAPS capsule Take 1 capsule (0.4 mg total) by mouth daily after supper. 30 capsule 3  ? triamcinolone cream (KENALOG) 0.1 % APPLY TO AFFECTED AREA TWICE A DAY. (Patient taking differently: Apply 1 application topically as needed.) 454 g 0  ? ?No current facility-administered  medications for this visit.  ? ? ?SURGICAL HISTORY:  ?Past Surgical History:  ?Procedure Laterality Date  ? BRONCHIAL BRUSHINGS  11/18/2020  ? Procedure: BRONCHIAL BRUSHINGS;  Surgeon: Spero Geralds, MD;  Location: [facility];  Service: Pulmonary;;  ? BRONCHIAL NEEDLE ASPIRATION BIOPSY  11/18/2020  ? Procedure: BRONCHIAL NEEDLE ASPIRATION BIOPSIES;  Surgeon: Spero Geralds, MD;  Location: [facility];  Service: Pulmonary;;  ? BRONCHIAL WASHINGS  11/18/2020  ? Procedure: BRONCHIAL WASHINGS;  Surgeon: Spero Geralds, MD;  Location: [facility];  Service: Pulmonary;;  ? CATARACT EXTRACTION, BILATERAL Bilateral 2013  ? COLONOSCOPY  2005  ? Carlean Purl  ? ENDOBRONCHIAL ULTRASOUND N/A 11/18/2020  ? Procedure: ENDOBRONCHIAL ULTRASOUND;  Surgeon: Spero Geralds, MD;  Location: [facility];  Service: Pulmonary;  Laterality: N/A;  ? POLYPECTOMY    ? SKIN CANCER EXCISION Right 1993  ? under eye-MOHS, freeze multiple places freq  ? TONSILLECTOMY  as child  ? TOTAL HIP ARTHROPLASTY Left 01/04/2014  ? Procedure: LEFT TOTAL HIP ARTHROPLASTY ANTERIOR APPROACH;  Surgeon: Mcarthur Rossetti, MD;  Location: [facility];  Service: Orthopedics;  Laterality: Left;  ? TOTAL HIP ARTHROPLASTY Left 02/14/2014  ? Procedure: Irrigation and Debridement left hip;  Surgeon: Mcarthur Rossetti, MD;  Location: [facility];  Service: Orthopedics;  Laterality: Left;  ? ? ?REVIEW OF SYSTEMS:  Constitutional: negative ?Eyes: negative ?Ears, nose, mouth, throat, and face: negative ?Respiratory: negative ?Cardiovascular: negative ?Gastrointestinal: negative ?Genitourinary:negative ?Integument/breast: negative ?Hematologic/lymphatic: negative ?Musculoskeletal:positive for arthralgias ?Neurological: positive for paresthesia ?Behavioral/Psych: negative ?Endocrine: negative ?Allergic/Immunologic: negative  ? ?PHYSICAL EXAMINATION: General appearance: alert, cooperative, fatigued, and no distress ?Head: Normocephalic, without obvious abnormality,  atraumatic ?Neck: no adenopathy, no JVD, supple, symmetrical, trachea midline, and thyroid not enlarged, symmetric, no tenderness/mass/nodules ?Lymph nodes: Cervical, supraclavicular, and axillary nodes normal. ?Resp: clear to auscultation bilaterally ?Back: symmetric, no curvature. ROM normal. No CVA tenderness. ?Cardio: regular rate and rhythm, S1, S2 normal, no murmur, click, rub or gallop ?GI: soft, non-tender; bowel sounds normal; no masses,  no organomegaly ?Extremities: extremities normal, atraumatic, no cyanosis or edema ?Neurologic: Alert and oriented X 3, normal strength and tone. Normal symmetric reflexes. Normal coordination and gait ? ?ECOG PERFORMANCE STATUS: 1 - Symptomatic but completely ambulatory ? ?Blood pressure 133/66, pulse 96, temperature 97.7 ?F (36.5 ?C), temperature source Tympanic, resp. rate 18, weight 245 lb 9 oz (111.4 kg), SpO2 96 %. ? ?LABORATORY DATA: ?Lab Results  ?Component Value Date  ? WBC 7.5 09/25/2021  ? HGB 12.8 (L) 09/25/2021  ? HCT 38.6 (L) 09/25/2021  ? MCV 93.2 09/25/2021  ? PLT 209 09/25/2021  ? ? ?  Chemistry   ?   ?Component Value Date/Time  ? NA 136 09/25/2021 1437  ? NA 141 01/29/2020 1157  ? K 4.7 09/25/2021 1437  ? CL 101 09/25/2021 1437  ?  CO2 27 09/25/2021 1437  ? BUN 25 (H) 09/25/2021 1437  ? BUN 12 01/29/2020 1157  ? CREATININE 1.28 (H) 09/25/2021 1437  ? CREATININE 1.11 07/26/2016 0921  ?    ?Component Value Date/Time  ? CALCIUM 10.1 09/25/2021 1437  ? ALKPHOS 67 09/25/2021 1437  ? AST 15 09/25/2021 1437  ? ALT 11 09/25/2021 1437  ? BILITOT 0.4 09/25/2021 1437  ?  ? ? ? ?RADIOGRAPHIC STUDIES: ?CT Chest W Contrast ? ?Result Date: 09/27/2021 ?CLINICAL DATA:  Left upper lobe lung cancer, assess treatment response * Tracking Code: BO * EXAM: CT CHEST, ABDOMEN, AND PELVIS WITH CONTRAST TECHNIQUE: Multidetector CT imaging of the chest, abdomen and pelvis was performed following the standard protocol during bolus administration of intravenous contrast. RADIATION DOSE  REDUCTION: This exam was performed according to the departmental dose-optimization program which includes automated exposure control, adjustment of the mA and/or kV according to patient size and/or use of iterative r

## 2021-09-29 NOTE — Progress Notes (Signed)
? ?  Subjective:  ? ? Patient ID: Alex Edwards, male    DOB: 02/05/1936, 86 y.o.   MRN: [id number] ? ?HPI ?He is here for consult concerning a 34-month history of joint aches and pains including shoulders, hips and now especially in his hands.  He continues to be followed by Dr. Earlie Server for his underlying cancer.  He has been using Tylenol in minimal doses.  He is concerned that this could be related to his chemotherapy.  He has used Feldene in the past and would like to get back on as-needed basis and work well.  Also ? ? ?Review of Systems ? ?   ?Objective:  ? Physical Exam ? ?Alert and in no No distress.  Exam of his elbows, wrist, hands shows no particular swelling pain or deformity. ? ? ?   ?Assessment & Plan:  ?Arthritis - Plan: piroxicam (FELDENE) 20 MG capsule ?Start with Tylenol but do not go past 3 g/day of Tylenol.  Try the Feldene and we will touch bases with your next visit.  Also discussed possible side effects from this medication. ? ?

## 2021-09-29 NOTE — Patient Instructions (Signed)
Start with Tylenol but do not go past 3 g/day of Tylenol.  Try the Feldene and we will touch bases with your next visit ?

## 2021-10-08 ENCOUNTER — Telehealth: Payer: Self-pay

## 2021-10-08 NOTE — Telephone Encounter (Signed)
Patient left VM asking if he should continue his Synthroid now that he is not on Keytruda. ? ?Routed to provider. ?

## 2021-10-23 ENCOUNTER — Encounter: Payer: Self-pay | Admitting: Family Medicine

## 2021-10-23 ENCOUNTER — Ambulatory Visit (INDEPENDENT_AMBULATORY_CARE_PROVIDER_SITE_OTHER): Payer: Medicare Other | Admitting: Family Medicine

## 2021-10-23 ENCOUNTER — Other Ambulatory Visit: Payer: Self-pay | Admitting: Family Medicine

## 2021-10-23 VITALS — BP 118/60 | HR 80 | Temp 98.6°F | Wt 250.4 lb

## 2021-10-23 DIAGNOSIS — E1169 Type 2 diabetes mellitus with other specified complication: Secondary | ICD-10-CM

## 2021-10-23 DIAGNOSIS — L299 Pruritus, unspecified: Secondary | ICD-10-CM | POA: Diagnosis not present

## 2021-10-23 DIAGNOSIS — E119 Type 2 diabetes mellitus without complications: Secondary | ICD-10-CM

## 2021-10-23 DIAGNOSIS — M1990 Unspecified osteoarthritis, unspecified site: Secondary | ICD-10-CM

## 2021-10-23 DIAGNOSIS — I152 Hypertension secondary to endocrine disorders: Secondary | ICD-10-CM | POA: Diagnosis not present

## 2021-10-23 DIAGNOSIS — B356 Tinea cruris: Secondary | ICD-10-CM

## 2021-10-23 DIAGNOSIS — J9601 Acute respiratory failure with hypoxia: Secondary | ICD-10-CM | POA: Diagnosis not present

## 2021-10-23 DIAGNOSIS — C3492 Malignant neoplasm of unspecified part of left bronchus or lung: Secondary | ICD-10-CM | POA: Diagnosis not present

## 2021-10-23 DIAGNOSIS — E785 Hyperlipidemia, unspecified: Secondary | ICD-10-CM | POA: Diagnosis not present

## 2021-10-23 DIAGNOSIS — E1159 Type 2 diabetes mellitus with other circulatory complications: Secondary | ICD-10-CM | POA: Diagnosis not present

## 2021-10-23 DIAGNOSIS — Z789 Other specified health status: Secondary | ICD-10-CM | POA: Diagnosis not present

## 2021-10-23 LAB — POCT GLYCOSYLATED HEMOGLOBIN (HGB A1C): Hemoglobin A1C: 6.2 % — AB (ref 4.0–5.6)

## 2021-10-23 MED ORDER — TRAMADOL HCL 50 MG PO TABS: 50.0000 mg | ORAL_TABLET | Freq: Three times a day (TID) | ORAL | 1 refills | Status: DC | PRN

## 2021-10-23 MED ORDER — KETOCONAZOLE 2 % EX CREA: 1.0000 "application " | TOPICAL_CREAM | Freq: Every day | CUTANEOUS | 1 refills | Status: DC

## 2021-10-23 MED ORDER — DICLOFENAC SODIUM 75 MG PO TBEC: 75.0000 mg | DELAYED_RELEASE_TABLET | Freq: Two times a day (BID) | ORAL | 0 refills | Status: DC

## 2021-10-23 NOTE — Progress Notes (Signed)
?Subjective:  ? ? Patient ID: Alex Edwards, male    DOB: [birth year], 86 y.o.   MRN: [id number] ? ?Alex Edwards is a 86 y.o. male who presents for follow-up of Type 2 diabetes mellitus. ? ?Home blood sugar records:  fasting and post meal lowest reading 85 highest reading 150 ?Current symptoms/problems include none and have been stable. ?Daily foot checks: yes Any foot concerns: none at this time  ?Exercise:  staying active  ?Diet: good  ?His main complaint today is arthritic symptoms.  He did try Feldene but this was not successful.  He complains of multiple joints aching.  He also is probably having some neuropathic pain as well.  He states that Tylenol does give some relief of his symptoms.  He continues on hydrochlorothiazide, metoprolol.  He takes hydroxyzine for his itching.  He is followed regularly by Dr. Julien Nordmann.  Apparently the chemotherapy has succeeded in shrinking the tumor.  He would like a refill for his ketoconazole that she is using for his tinea. ?The following portions of the patient's history were reviewed and updated as appropriate: allergies, current medications, past medical history, past social history and problem list. ? ?ROS as in subjective above. ? ?   ?Objective:  ?  ?Physical Exam ?Alert and in no distress otherwise not examined. ? ?Alert and in no distress.  Hemoglobin A1c is 6.2. ? ?Lab Review ? ?  Latest Ref Rng & Units 09/25/2021  ?  2:37 PM 06/30/2021  ?  2:31 PM 06/08/2021  ?  9:52 AM 06/01/2021  ?  1:00 PM 06/01/2021  ? 11:23 AM  ?Diabetic Labs  ?HbA1c 4.0 - 5.6 %     6.6    ?Microalbumin mg/L    26.0     ?Micro/Creat Ratio     26.7     ?Creatinine 0.61 - 1.24 mg/dL 1.28   1.45   1.25      ? ? ?  09/29/2021  ?  2:37 PM 09/29/2021  ?  1:32 PM 08/21/2021  ?  2:57 PM 06/30/2021  ?  2:38 PM 06/08/2021  ? 10:18 AM  ?BP/Weight  ?Systolic BP 048 889  169 450  ?Diastolic BP 66 66  61 61  ?Wt. (Lbs) 245.8 245.56 243 250.4 248.1  ?BMI 32.43 kg/m2 32.4 kg/m2 32.06 kg/m2 33.26 kg/m2 32.96 kg/m2  ? ? ?   06/01/2021  ? 11:15 AM 01/29/2021  ? 11:00 AM  ?Foot/eye exam completion dates  ?Foot Form Completion Done Done  ? ? ?[place]  reports that he quit smoking about 7 years ago. His smoking use included cigarettes. He has a 50.00 pack-year smoking history. He has never used smokeless tobacco. He reports that he does not drink alcohol and does not use drugs. ? ?   ?Assessment & Plan:  ?  ?Controlled type 2 diabetes mellitus without complication, without long-term current use of insulin ([place]) - Plan: POCT glycosylated hemoglobin (Hb A1C) ? ?Arthritis - Plan: diclofenac (VOLTAREN) 75 MG EC tablet, traMADol (ULTRAM) 50 MG tablet ? ?Tinea cruris ? ?Hypertension associated with diabetes ([place]) ? ?Hyperlipidemia associated with type 2 diabetes mellitus ([place]) ? ?Stage IV squamous cell carcinoma of left lung ([place]) ? ?Statin intolerance ? ?Itching ? ?Ketoconazole was called in.  He will continue on his other medications.  Recommended regular dosing of Tylenol as well as switching him to Voltaren.  Small prescription for tramadol also given.  He is having multiple pains of joints which is probably  a combination of multiple issues including chemotherapy, age underlying diabetes etc. ? ?  ?

## 2021-10-23 NOTE — Telephone Encounter (Signed)
[facility] is requesting to fill pt kenalog cream. Please advise [place] ?

## 2021-11-02 ENCOUNTER — Ambulatory Visit: Payer: Medicare Other | Admitting: Family Medicine

## 2021-11-02 ENCOUNTER — Encounter: Payer: Self-pay | Admitting: Family Medicine

## 2021-11-02 VITALS — BP 125/68 | Wt 250.0 lb

## 2021-11-02 DIAGNOSIS — M1990 Unspecified osteoarthritis, unspecified site: Secondary | ICD-10-CM

## 2021-11-02 MED ORDER — HYDROCODONE-ACETAMINOPHEN 5-325 MG PO TABS: 1.0000 | ORAL_TABLET | Freq: Four times a day (QID) | ORAL | 0 refills | Status: DC | PRN

## 2021-11-02 NOTE — Progress Notes (Signed)
? ?  Subjective:  ? ? Patient ID: Alex Edwards, male    DOB: [birth year], 86 y.o.   MRN: [id number] ? ?HPI ?He called indicating continued difficulty with generalized arthritic pain.  He now indicates that he initially had this in the shoulder area and proceeded to cause arthritic pains in other areas.  He was given a 5-day prednisone Dosepak by Dr. Julien Nordmann and he states that that did help for several weeks.  He states that Tylenol, Voltaren and tramadol have been unsuccessful. ? ? ?Review of Systems ? ?   ?Objective:  ? Physical Exam ?Alert and in no distress otherwise not examined ? ? ? ?   ?Assessment & Plan:  ?Arthritis ?I will call in codeine to help for tonight and have him come back in tomorrow for more involved exam.  The symptoms do sound like this could be PMR. ? ?

## 2021-11-03 ENCOUNTER — Telehealth: Payer: Self-pay

## 2021-11-03 ENCOUNTER — Ambulatory Visit (INDEPENDENT_AMBULATORY_CARE_PROVIDER_SITE_OTHER): Payer: Medicare Other | Admitting: Family Medicine

## 2021-11-03 ENCOUNTER — Other Ambulatory Visit: Payer: Medicare Other

## 2021-11-03 ENCOUNTER — Encounter: Payer: Self-pay | Admitting: Family Medicine

## 2021-11-03 VITALS — BP 130/70 | HR 72 | Temp 97.7°F | Wt 249.2 lb

## 2021-11-03 DIAGNOSIS — M1990 Unspecified osteoarthritis, unspecified site: Secondary | ICD-10-CM

## 2021-11-03 MED ORDER — PREDNISONE 20 MG PO TABS: 20.0000 mg | ORAL_TABLET | Freq: Every day | ORAL | 1 refills | Status: DC

## 2021-11-03 NOTE — Telephone Encounter (Signed)
Please put in labs for pt nurse visit. Thank you.  ? ?[place] ?

## 2021-11-03 NOTE — Telephone Encounter (Signed)
Done

## 2021-11-03 NOTE — Patient Instructions (Signed)
Polymyalgia Rheumatica ?Polymyalgia rheumatica (PMR) is an inflammatory disorder that causes the muscles and joints to ache and become stiff. Sometimes, PMR leads to a more dangerous condition that can cause vision loss (temporal arteritis or giant cell arteritis). ?What are the causes? ?The exact cause of PMR is not known. ?What increases the risk? ?You are more likely to develop this condition if you are: ?Male. ?84 years old or older. ?Of [place] descent. ?What are the signs or symptoms? ?Pain and stiffness are the main symptoms of PMR and affect both sides of the body. These symptoms: ?May be worse after inactivity and in the morning. ?May affect your: ?Hips, buttocks, and thighs. ?Neck, arms, and shoulders. This can make it hard to raise your arms above your head. ?Hands and wrists. ?Other symptoms include: ?Fever. ?Tiredness. ?Weakness. ?Depression. ?Decreased appetite. This may lead to weight loss. ?Symptoms could start slowly but often come on suddenly, sometimes even overnight, or over a few days. ?How is this diagnosed? ?This condition is diagnosed with your medical history and a physical exam. You may need to see a health care provider who specializes in diseases of the joints, muscles, and bones (rheumatologist). You may also have tests, including: ?Blood tests. Often, test results that show inflammation are very elevated. ?Imaging studies like X-rays or ultrasound, which may be done to rule out other conditions. These are often usual in PMR. ?How is this treated? ?PMR usually goes away without treatment, but it may take years. ?PMR often responds rapidly (within a few days) to low-dose glucocorticoids (steroids). These medicines have serious side effects. Once your symptoms are better controlled, the dose should be lowered to find the lowest possible dose that controls your symptoms. ?Regular exercise and rest will also help your symptoms. ?Follow these instructions at home: ? ?Take  over-the-counter and prescription medicines only as told by your health care provider. ?Make sure to get enough rest and sleep. ?Eat a healthy and nutritious diet that includes calcium and vitamin D. Calcium is important for bone health, and vitamin D helps your body absorb calcium. Good sources of calcium and vitamin D include: ?Certain fatty fish, such as salmon and tuna. ?Products that have calcium and vitamin D added to them (are fortified), such as fortified cereals. ?Collard greens, turnip greens, broccoli, and kale. ?Egg yolks. ?Cheese. ?Liver. ?Try to exercise most days of the week. Ask your health care provider what type of exercises are best for you. ?Keep all follow-up visits. This is important. ?Contact a health care provider if: ?Your symptoms do not improve with medicine. ?You have side effects from steroids. These may include: ?Weight gain. ?Swelling. ?Insomnia. ?Mood changes. ?Bruising. ?High blood sugar readings, if you have diabetes. ?Higher than normal blood pressure readings, if you monitor your blood pressure. ?Get help right away if: ?You develop symptoms of temporal arteritis, such as: ?A change in vision. ?Severe headache. ?Scalp pain. ?Jaw pain. ?These symptoms may represent a serious problem that is an emergency. Do not wait to see if the symptoms will go away. Get medical help right away. Call your local emergency services (911 in the [place] Do not drive yourself to the hospital. ?Summary ?Polymyalgia rheumatica is an inflammatory disorder that causes aching and stiffness in your muscles and joints. ?The exact cause of this condition is not known. ?This condition usually goes away without treatment. Your health care provider may give you low-dose glucocorticoids (steroids) to help manage your pain and stiffness. ?Rest and regular exercise will help  the symptoms. ?This information is not intended to replace advice given to you by your health care provider. Make sure you discuss any  questions you have with your health care provider. ?Document Revised: 10/16/2020 Document Reviewed: 10/16/2020 ?Elsevier Patient Education ? [place]. ? ?

## 2021-11-03 NOTE — Progress Notes (Signed)
? ?  Subjective:  ? ? Patient ID: Alex Edwards, male    DOB: [birth year], 86 y.o.   MRN: [id number] ? ?HPI ?He is here for further evaluation of his arthritis type symptoms.  Refer to notes from yesterday to get a more detailed history. ? ? ?Review of Systems ? ?   ?Objective:  ? Physical Exam ?Alert and in no distress.  Exam of fingers, wrist, elbows, knees shows no evidence of pain or effusion. ? ? ? ?   ?Assessment & Plan:  ?Arthritis - Plan: Sedimentation rate, predniSONE (DELTASONE) 20 MG tablet ?His symptoms are consistent with PMR but I want to wait to see what the sed rate is.  I will go ahead and treat him.  Discussed the fact that on occasion the sed rate can be normal but still indicative of PMR.  He has been reading about this and has gotten the same information from [employer].  Discussed the possible duration of treatment with this and slow taper. ? ?

## 2021-11-04 LAB — SEDIMENTATION RATE: Sed Rate: 17 mm/hr (ref 0–30)

## 2021-11-11 ENCOUNTER — Telehealth: Payer: Self-pay | Admitting: Pharmacist

## 2021-11-11 ENCOUNTER — Other Ambulatory Visit: Payer: Self-pay | Admitting: Family Medicine

## 2021-11-11 DIAGNOSIS — C3492 Malignant neoplasm of unspecified part of left bronchus or lung: Secondary | ICD-10-CM

## 2021-11-11 NOTE — Chronic Care Management (AMB) (Signed)
    Chronic Care Management Pharmacy Assistant   Name: Alex Edwards  MRN: [id number] DOB: [birth year]  11/11/21 APPOINTMENT REMINDER     Patient was reminded to have all medications, supplements and any blood glucose and blood pressure readings available for review with [name], Pharm. D, for telephone visit on 11/12/21 at 10:30.    Care Gaps: Ophthalmology Exam - Overdue COVID Booster - Overdue Bp- 130/70 ( 11/03/21) AWV- 08/21/21  Star Rating Drug: Losartan (Cozaar) 100 mg - Last filled 10/12/21 90 DS at [facility]  Any gaps in medications fill history? None    Medications: Outpatient Encounter Medications as of 11/11/2021  Medication Sig   acetaminophen (TYLENOL) 500 MG tablet Take 500 mg by mouth every 6 (six) hours as needed.   Ascorbic Acid (VITAMIN C) 1000 MG tablet Take 1,000 mg by mouth daily.   Cholecalciferol (VITAMIN D-3) 125 MCG (5000 UT) TABS Take 1 tablet by mouth daily.   diclofenac (VOLTAREN) 75 MG EC tablet Take 1 tablet (75 mg total) by mouth 2 (two) times daily.   fexofenadine (ALLEGRA) 180 MG tablet Take 180 mg by mouth as needed. (Patient not taking: Reported on 09/29/2021)   hydrochlorothiazide (MICROZIDE) 12.5 MG capsule Take 1 capsule (12.5 mg total) by mouth daily.   HYDROcodone-acetaminophen (NORCO) 5-325 MG tablet Take 1 tablet by mouth every 6 (six) hours as needed for moderate pain.   hydrOXYzine (VISTARIL) 25 MG capsule Take 1 capsule (25 mg total) by mouth every 8 (eight) hours as needed. (Patient not taking: Reported on 09/29/2021)   ketoconazole (NIZORAL) 2 % cream APPLY TO THE AFFECTED AREA ONCE A DAY.   levothyroxine (SYNTHROID) 50 MCG tablet Take 1 tablet (50 mcg total) by mouth daily before breakfast.   loratadine (CLARITIN) 10 MG tablet Take 10 mg by mouth daily.   losartan (COZAAR) 100 MG tablet Take 1 tablet (100 mg total) by mouth daily.   metoprolol tartrate (LOPRESSOR) 50 MG tablet TAKE ONE TABLET BY MOUTH TWICE DAILY    Multiple Vitamins-Minerals (PRESERVISION AREDS 2) CAPS Take 1 capsule by mouth in the morning and at bedtime.   [place] Patient is to test one time a day DX: E11.9   ONETOUCH VERIO test strip USE TO TEST BLOOD SUGAR ONCE DAILY.   predniSONE (DELTASONE) 20 MG tablet Take 1 tablet (20 mg total) by mouth daily with breakfast.   prochlorperazine (COMPAZINE) 10 MG tablet Take 1 tablet (10 mg total) by mouth every 6 (six) hours as needed for nausea or vomiting. (Patient not taking: Reported on 09/29/2021)   tamsulosin (FLOMAX) 0.4 MG CAPS capsule Take 1 capsule (0.4 mg total) by mouth daily after supper.   traMADol (ULTRAM) 50 MG tablet Take 1 tablet (50 mg total) by mouth every 8 (eight) hours as needed for up to 20 days.   triamcinolone cream (KENALOG) 0.1 % APPLY TO AFFECTED AREA TWICE A DAY.   No facility-administered encounter medications on file as of 11/11/2021.      [place] Clinical Pharmacist Assistant [phone]

## 2021-11-12 ENCOUNTER — Other Ambulatory Visit: Payer: Self-pay | Admitting: Physician Assistant

## 2021-11-12 ENCOUNTER — Ambulatory Visit (INDEPENDENT_AMBULATORY_CARE_PROVIDER_SITE_OTHER): Payer: Medicare Other | Admitting: Pharmacist

## 2021-11-12 DIAGNOSIS — I152 Hypertension secondary to endocrine disorders: Secondary | ICD-10-CM

## 2021-11-12 DIAGNOSIS — E119 Type 2 diabetes mellitus without complications: Secondary | ICD-10-CM

## 2021-11-12 DIAGNOSIS — E039 Hypothyroidism, unspecified: Secondary | ICD-10-CM

## 2021-11-12 NOTE — Patient Instructions (Signed)
Hi Alex Edwards,  It was great to catch up again! I am glad that your pain is getting better. Don't forget to call the office once you finish a week of the 1/2 tablet of prednisone.  I will update you once I have a chance to talk to Dr. Redmond School.  Please reach out to me if you have any questions or need anything!  Best, Maddie  [name], PharmD, [place] [phone]   Visit Information   Goals Addressed   None    Patient Care Plan: CCM Pharmacy Care Plan     Problem Identified: Problem: Hypertension, Hyperlipidemia, Diabetes, BPH, and Allergic Rhinitis      Long-Range Goal: Patient-Specific Goal   Start Date: 05/14/2021  Expected End Date: 05/14/2022  Recent Progress: On track  Priority: High  Note:   Current Barriers:  Unable to achieve control of swelling and cholesterol   Pharmacist Clinical Goal(s):  Patient will achieve adherence to monitoring guidelines and medication adherence to achieve therapeutic efficacy achieve control of cholesterol as evidenced by next lipid panel  through collaboration with PharmD and provider.   Interventions: 1:1 collaboration with [name], MD regarding development and update of comprehensive plan of care as evidenced by provider attestation and co-signature Inter-disciplinary care team collaboration (see longitudinal plan of care) Comprehensive medication review performed; medication list updated in electronic medical record  Hypertension (BP goal <140/90) -Controlled -Current treatment: Losartan 100 mg 1 tablet daily - in AM - Appropriate, Effective, Safe, Accessible Hydrochlorothiazide 12.5 mg 1 capsule daily - in AM - Appropriate, Effective, Safe, Accessible Metoprolol tartrate 50 mg 1 tablet twice daily - Appropriate, Effective, Safe, Accessible -Medications previously tried: none  -Current home readings: 125/68-70; has an arm cuff but does not check often at home -Current  dietary habits: doesn't eat a lot of salt; does like some salt; frozen meal once a week; rarely canned vegetables but some canned soup -Current exercise habits: unable to do much -Denies hypotensive/hypertensive symptoms -Educated on BP goals and benefits of medications for prevention of heart attack, stroke and kidney damage; Exercise goal of 150 minutes per week; Importance of home blood pressure monitoring; Proper BP monitoring technique; -Counseled to monitor BP at home weekly, document, and provide log at future appointments -Counseled on diet and exercise extensively Recommended to continue current medication  Hyperlipidemia/aortic atherosclerosis: (LDL goal < 70) -Uncontrolled -Current treatment: No medications -Medications previously tried: statins, Zetia (myalgias)  -Current dietary patterns: cooks with vegetable oil and some olive oil  -Current exercise habits: unable to -Educated on Cholesterol goals;  Benefits of statin for ASCVD risk reduction; Importance of limiting foods high in cholesterol; Exercise goal of 150 minutes per week; -Counseled on diet and exercise extensively Recommended repeat lipid panel. Consider PCSK9 therapy.  Diabetes (A1c goal <7%) -Controlled -Current medications: No medications -Medications previously tried: none  -Current home glucose readings fasting glucose: 105-115 post prandial glucose: < 100 -Denies hypoglycemic/hyperglycemic symptoms -Current meal patterns:  breakfast: n/a  lunch: n/a  dinner: n/a snacks: n/a drinks: n/a -Current exercise: unable to -Educated on A1c and blood sugar goals; Benefits of routine self-monitoring of blood sugar; -Counseled to check feet daily and get yearly eye exams -Counseled on diet and exercise extensively  BPH (Goal: minimize symptoms of enlarged prostate) -Controlled -Current treatment  Tamsulosin 0.4 mg 1 capsule daily after supper - Appropriate, Effective, Safe, Accessible -Medications  previously tried: none  -Recommended to continue current medication  Allergic rhinitis (Goal: minimize symptoms) -  Controlled -Current treatment  Loratadine 10 mg 1 tablet daily - Appropriate, Effective, Safe, Accessible Allegra 180 mg 1 tablet daily as needed (only uses when completely out of claritin) -Medications previously tried: none  -Counseled on alternating use of antihistamines and not using in the same day.  Health Maintenance -Vaccine gaps: COVID booster, shingrix -Current therapy:  Preservision 2 1 capsule twice daily Prochlorperazine 10 mg 1 tablet as needed Triamcinolone cream 0.1% as needed Vitamin C 1000 mcg 1 tablet daily Vitamin D 5000 units every other day -Educated on Cost vs benefit of each product must be carefully weighed by individual consumer Supplements may interfere with prescription drugs -Patient is satisfied with current therapy and denies issues -Recommended to continue current medication  Patient Goals/Self-Care Activities Patient will:  - take medications as prescribed as evidenced by patient report and record review check glucose weekly, document, and provide at future appointments check blood pressure weekly, document, and provide at future appointments  Follow Up Plan: The care management team will reach out to the patient again over the next 14 days.         Patient verbalizes understanding of instructions and care plan provided today and agrees to view in [place]. Active MyChart status and patient understanding of how to access instructions and care plan via MyChart confirmed with patient.    The pharmacy team will reach out to the patient again over the next 14 days.   Alex Edwards, [facility]

## 2021-11-12 NOTE — Progress Notes (Signed)
Chronic Care Management Pharmacy Note  11/12/2021 Name:  Dorman Calderwood MRN:  [id number] DOB:  15-Jan-1936  Summary: LDL not at goal < 70 BP at goal < 140/90  Recommendations/Changes made from today's visit: -Recommend repeat lipid panel and consider PCSK9 therapy -Recommend vitamin B12 level with neuropathy -Consider repeat TSH with oncology lab work  Plan: BP assessment in 3 months Follow up in 6 months   Subjective: Alex Edwards is an 86 y.o. year old male who is a primary patient of [name], MD.  The CCM team was consulted for assistance with disease management and care coordination needs.    Engaged with patient by telephone for follow up visit in response to provider referral for pharmacy case management and/or care coordination services.   Consent to Services:  The patient was given information about Chronic Care Management services, agreed to services, and gave verbal consent prior to initiation of services.  Please see initial visit note for detailed documentation.   Patient Care Team: [name], MD as PCP - General (Family Medicine) Valrie Hart, RN as Oncology Nurse Navigator (Oncology) Viona Gilmore, [facility] as Pharmacist (Pharmacist)  Recent office visits: 11/03/21 Alex Alexanders, MD: Patient presented for arthritis and possible PMR diagnosis. Prescribed prednisone 20 mg daily.  11/02/21 Alex Alexanders, MD: Patient presented for arthritis acute visit. Prescribed Norco for pain x 3 days and recommended in office follow up.  10/23/21 Alex Alexanders, MD: Patient presented for DM follow up. Prescribed diclofenac tablets and tramadol for pain. A1c is stable.  09/29/21 Alex Alexanders, MD: Patient presented for arthritis acute visit.  Prescribed Feldene 20 mg daily.  08/21/21 Alex Durand, LPN: Patient presented for AWV.  06/01/21 Alex Alexanders, MD: Patient presented for DM follow up. Plan to restart levothyroxine with elevated TSH. Prescribed hydroxyzine for his  rash.  Recent consult visits: 09/29/21 Alex Bears, MD(Oncology): Patient presented for Malignant neoplasm of unspecified part of unspecified ronchus or lung and other concerns. Continue to hold Keytruda.  06/30/21 Alex [name], PA-C (Oncology): Patient presented for Stage IV squamous cell carcinoma of left lung and other concerns. Plan to start holding Caroleen with restaging CT scan and follow up in 3 months.  06/08/21 Alex Bears, MD(Oncology): Patient presented for Malignant neoplasm of unspecified part of unspecified ronchus or lung and other concerns. Administered Keytruda infusion.  05/18/21 Alex Bears, MD(Oncology): Patient presented for Malignant neoplasm of unspecified part of unspecified ronchus or lung and other concerns. Administered Keytruda infusion.  Hospital visits: Medication Reconciliation was completed by comparing discharge summary, patient's EMR and Pharmacy list, and upon discussion with patient.   Patient presented to [facility] on 11/17/20 due to Lung Mass. Patient was present for 5 days.   New?Medications Started at [facility]:?? -started  amLODipine (NORVASC) amoxicillin-clavulanate (Augmentin) tamsulosin (Flomax)   Medication Changes at Hospital Discharge: -Changed  losartan (Cozaar)   Medications Discontinued at Hospital Discharge: -Stopped  atorvastatin 10 MG tablet (Lipitor) cephALEXin 500 MG capsule (KEFLEX   Medications that remain the same after Hospital Discharge:??  -All other medications will remain the same.       Objective:  Lab Results  Component Value Date   CREATININE 1.28 (H) 09/25/2021   BUN 25 (H) 09/25/2021   GFRNONAA 56 (L) 09/25/2021   GFRAA 84 01/29/2020   NA 136 09/25/2021   K 4.7 09/25/2021   CALCIUM 10.1 09/25/2021   CO2 27 09/25/2021   GLUCOSE 88 09/25/2021  Lab Results  Component Value Date/Time   HGBA1C 6.2 (A) 10/23/2021 11:15 AM   HGBA1C 6.6 (A) 06/01/2021  11:23 AM   HGBA1C 6.4 (H) 11/18/2020 04:43 AM   HGBA1C 6.7 (H) 07/26/2016 09:21 AM   MICROALBUR 26.0 06/01/2021 01:00 PM   MICROALBUR 51.8 01/29/2020 02:26 PM    Last diabetic Eye exam: No results found for: HMDIABEYEEXA  Last diabetic Foot exam: No results found for: HMDIABFOOTEX   Lab Results  Component Value Date   CHOL 187 01/29/2020   HDL 34 (L) 01/29/2020   LDLCALC 122 (H) 01/29/2020   TRIG 174 (H) 01/29/2020   CHOLHDL 5.5 (H) 01/29/2020       Latest Ref Rng & Units 09/25/2021    2:37 PM 06/30/2021    2:31 PM 06/08/2021    9:52 AM  Hepatic Function  Total Protein 6.5 - 8.1 g/dL 8.0   7.0   6.9    Albumin 3.5 - 5.0 g/dL 4.4   4.1   3.8    AST 15 - 41 U/L 15   14   12     ALT 0 - 44 U/L 11   12   12     Alk Phosphatase 38 - 126 U/L 67   68   69    Total Bilirubin 0.3 - 1.2 mg/dL 0.4   0.4   0.5      Lab Results  Component Value Date/Time   TSH 3.742 09/25/2021 02:25 PM   TSH 4.293 (H) 06/30/2021 02:31 PM       Latest Ref Rng & Units 09/25/2021    2:37 PM 06/30/2021    2:31 PM 06/08/2021    9:52 AM  CBC  WBC 4.0 - 10.5 K/uL 7.5   6.2   6.7    Hemoglobin 13.0 - 17.0 g/dL 12.8   11.9   12.0    Hematocrit 39.0 - 52.0 % 38.6   34.4   34.5    Platelets 150 - 400 K/uL 209   181   175      No results found for: VD25OH  Clinical ASCVD: Yes  The ASCVD Risk score (Arnett DK, et al., 2019) failed to calculate for the following reasons:   The 2019 ASCVD risk score is only valid for ages 63 to 60       08/21/2021    3:10 PM 01/29/2021   10:59 AM 01/29/2020   10:32 AM  Depression screen PHQ 2/9  Decreased Interest 0 0 0  Down, Depressed, Hopeless 0 0 0  PHQ - 2 Score 0 0 0      Social History   Tobacco Use  Smoking Status Former   Packs/day: 1.00   Years: 50.00   Pack years: 50.00   Types: Cigarettes   Quit date: 04/28/2014   Years since quitting: 7.5  Smokeless Tobacco Never   BP Readings from Last 3 Encounters:  11/03/21 130/70  11/02/21 125/68  10/23/21  118/60   Pulse Readings from Last 3 Encounters:  11/03/21 72  10/23/21 80  09/29/21 78   Wt Readings from Last 3 Encounters:  11/03/21 249 lb 3.2 oz (113 kg)  11/02/21 250 lb (113.4 kg)  10/23/21 250 lb 6.4 oz (113.6 kg)   BMI Readings from Last 3 Encounters:  11/03/21 32.88 kg/m  11/02/21 32.98 kg/m  10/23/21 33.04 kg/m    Assessment/Interventions: Review of patient past medical history, allergies, medications, health status, including review of consultants reports, laboratory and other test data,  was performed as part of comprehensive evaluation and provision of chronic care management services.   SDOH:  (Social Determinants of Health) assessments and interventions performed: Yes   SDOH Screenings   Alcohol Screen: Not on file  Depression (PHQ2-9): Low Risk    PHQ-2 Score: 0  Financial Resource Strain: Medium Risk   Difficulty of Paying Living Expenses: Somewhat hard  Food Insecurity: No Food Insecurity   Worried About Charity fundraiser in the Last Year: Never true   Ran Out of Food in the Last Year: Never true  Housing: Not on file  Physical Activity: Insufficiently Active   Days of Exercise per Week: 1 day   Minutes of Exercise per Session: 30 min  Social Connections: Not on file  Stress: No Stress Concern Present   Feeling of Stress : Not at all  Tobacco Use: Medium Risk   Smoking Tobacco Use: Former   Smokeless Tobacco Use: Never   Passive Exposure: Not on file  Transportation Needs: No Transportation Needs   Lack of Transportation (Medical): No   Lack of Transportation (Non-Medical): No    CCM Care Plan  Allergies  Allergen Reactions   Paclitaxel Shortness Of Breath    SOB and light headedness, flushing and 8/10 pain in left flank Pepcid and Solumedrol administered.  Able to tolerate remainder of Taxol infusion   Vancomycin     Red Man Syndrome    Amlodipine Swelling   Latex Other (See Comments)    "possibly allergic- dry, itching, rash"     Medications Reviewed Today     Reviewed by Viona Gilmore, [facility] (Pharmacist) on 11/12/21 at 1051  Med List Status: <None>   Medication Order Taking? Sig Documenting Provider Last Dose Status Informant  acetaminophen (TYLENOL) 500 MG tablet [id number]  Take 500 mg by mouth every 6 (six) hours as needed. [provider]  Active   Ascorbic Acid (VITAMIN C) 1000 MG tablet [id number]  Take 1,000 mg by mouth daily. [provider]  Active Self  Cholecalciferol (VITAMIN D-3) 125 MCG (5000 UT) TABS [id number]  Take 1 tablet by mouth every other day. [provider]  Active   hydrochlorothiazide (MICROZIDE) 12.5 MG capsule [id number]  Take 1 capsule (12.5 mg total) by mouth daily. [name], MD  Active   HYDROcodone-acetaminophen [facility]) 5-325 MG tablet [id number] No Take 1 tablet by mouth every 6 (six) hours as needed for moderate pain.  Patient not taking: Reported on 11/12/2021   [name], MD Not Taking Active   ketoconazole (NIZORAL) 2 % cream [id number]  APPLY TO THE AFFECTED AREA ONCE A DAY. [name], MD  Active   levothyroxine (SYNTHROID) 50 MCG tablet [id number]  Take 1 tablet (50 mcg total) by mouth daily before breakfast. Edwards, Alex L, PA-C  Active   loratadine (CLARITIN) 10 MG tablet [id number]  Take 10 mg by mouth daily. [provider]  Active   losartan (COZAAR) 100 MG tablet [id number]  Take 1 tablet (100 mg total) by mouth daily. [name], MD  Expired 11/03/21 2359   metoprolol tartrate (LOPRESSOR) 50 MG tablet [id number]  TAKE ONE TABLET BY MOUTH TWICE DAILY [name], MD  Active   Multiple Vitamins-Minerals (PRESERVISION AREDS 2) CAPS [id number]  Take 1 capsule by mouth in the morning and at bedtime. [provider]  Active Self  Jonetta Speak LANCETS FINE [place] [phone]  Patient is to test one time a day DX: E11.9 [name],  MD  Active Self  ONETOUCH VERIO test strip [id number]  USE TO TEST  BLOOD SUGAR ONCE DAILY. [name], MD  Active   predniSONE (DELTASONE) 20 MG tablet [id number] Yes Take 1 tablet (20 mg total) by mouth daily with breakfast. [name], MD Taking Active   tamsulosin [facility]) 0.4 MG CAPS capsule [id number]  Take 1 capsule (0.4 mg total) by mouth daily after supper. [name], MD  Active   triamcinolone cream (KENALOG) 0.1 % [id number] Yes APPLY TO AFFECTED AREA TWICE A DAY. 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   Internal carotid artery stenosis, right 11/21/2020   Aortic atherosclerosis ([place]) 02/14/2018   Onychomycosis 09/26/2017   Macular degeneration 01/25/2017   History of cataract surgery 01/25/2017   Statin intolerance 09/22/2016   Controlled type 2 diabetes mellitus without complication, without long-term current use of insulin ([place]) 09/22/2016   Senile purpura ([place]) 01/13/2015   Hx of adenomatous colonic polyps 01/13/2015   Actinic keratosis of multiple sites of head and neck 12/09/2014   Status post THR (total hip replacement) 01/04/2014   Hypertension associated with diabetes ([place]) 10/14/2011   Hyperlipidemia associated with type 2 diabetes mellitus ([place]) 10/14/2011   Psoriasis 10/14/2011   Obesity (BMI 30-39.9) 10/14/2011   Allergic rhinitis due to pollen 10/14/2011   Former smoker 10/14/2011    Immunization History  Administered Date(s) Administered   Fluad Quad(high Dose 65+) 04/10/2019, 04/09/2020, 04/14/2021   Influenza Split 04/22/2009, 07/02/2010, 05/12/2011, 03/28/2012   Influenza, High Dose Seasonal PF 04/02/2013, 04/23/2014, 04/09/2015, 03/16/2016, 03/29/2017, 04/17/2018   PFIZER Comirnaty(Gray  Top)Covid-19 Tri-Sucrose Vaccine 01/29/2021   PFIZER(Purple Top)SARS-COV-2 Vaccination 08/24/2019, 09/18/2019, 05/28/2020   Pneumococcal Conjugate-13 04/23/2014   Pneumococcal Polysaccharide-23 03/29/2005   Td 03/29/2005   Tdap 02/11/2012   Patient is feeling much better with the prednisone and he is sleeping better and having less pain overall.   Conditions to be addressed/monitored:  Hypertension, Hyperlipidemia, Diabetes, BPH, and Allergic Rhinitis  Conditions addressed this visit: Hypertension, diabetes  Care Plan : CCM Pharmacy Care Plan  Updates made by Viona Gilmore, [place] since 11/12/2021 12:00 AM     Problem: Problem: Hypertension, Hyperlipidemia, Diabetes, BPH, and Allergic Rhinitis      Long-Range Goal: Patient-Specific Goal   Start Date: 05/14/2021  Expected End Date: 05/14/2022  Recent Progress: On track  Priority: High  Note:   Current Barriers:  Unable to achieve control of swelling and cholesterol   Pharmacist Clinical Goal(s):  Patient will achieve adherence to monitoring guidelines and medication adherence to achieve therapeutic efficacy achieve control of cholesterol as evidenced by next lipid panel  through collaboration with PharmD and provider.   Interventions: 1:1 collaboration with [name], MD regarding development and update of comprehensive plan of care as evidenced by provider attestation and co-signature Inter-disciplinary care team collaboration (see longitudinal plan of care) Comprehensive medication review performed; medication list updated in electronic medical record  Hypertension (BP goal <140/90) -Controlled -Current treatment: Losartan 100 mg 1 tablet daily - in AM - Appropriate, Effective, Safe, Accessible Hydrochlorothiazide 12.5 mg 1 capsule daily - in AM - Appropriate, Effective, Safe, Accessible  Metoprolol tartrate 50 mg 1 tablet twice daily - Appropriate, Effective, Safe, Accessible -Medications previously tried: none   -Current home readings: 125/68-70; has an arm cuff but does not check often at home -Current dietary habits: doesn't eat a lot of salt; does like some salt; frozen meal once a week; rarely canned vegetables but some canned soup -Current exercise habits: unable to do much -Denies hypotensive/hypertensive symptoms -Educated on BP goals and benefits of medications for prevention of heart attack, stroke and kidney damage; Exercise goal of 150 minutes per week; Importance of home blood pressure monitoring; Proper BP monitoring technique; -Counseled to monitor BP at home weekly, document, and provide log at future appointments -Counseled on diet and exercise extensively Recommended to continue current medication  Hyperlipidemia/aortic atherosclerosis: (LDL goal < 70) -Uncontrolled -Current treatment: No medications -Medications previously tried: statins, Zetia (myalgias)  -Current dietary patterns: cooks with vegetable oil and some olive oil  -Current exercise habits: unable to -Educated on Cholesterol goals;  Benefits of statin for ASCVD risk reduction; Importance of limiting foods high in cholesterol; Exercise goal of 150 minutes per week; -Counseled on diet and exercise extensively Recommended repeat lipid panel. Consider PCSK9 therapy.  Diabetes (A1c goal <7%) -Controlled -Current medications: No medications -Medications previously tried: none  -Current home glucose readings fasting glucose: 105-115 post prandial glucose: < 100 -Denies hypoglycemic/hyperglycemic symptoms -Current meal patterns:  breakfast: n/a  lunch: n/a  dinner: n/a snacks: n/a drinks: n/a -Current exercise: unable to -Educated on A1c and blood sugar goals; Benefits of routine self-monitoring of blood sugar; -Counseled to check feet daily and get yearly eye exams -Counseled on diet and exercise extensively  BPH (Goal: minimize symptoms of enlarged prostate) -Controlled -Current treatment   Tamsulosin 0.4 mg 1 capsule daily after supper - Appropriate, Effective, Safe, Accessible -Medications previously tried: none  -Recommended to continue current medication  Allergic rhinitis (Goal: minimize symptoms) -Controlled -Current treatment  Loratadine 10 mg 1 tablet daily - Appropriate, Effective, Safe, Accessible Allegra 180 mg 1 tablet daily as needed (only uses when completely out of claritin) -Medications previously tried: none  -Counseled on alternating use of antihistamines and not using in the same day.  Health Maintenance -Vaccine gaps: COVID booster, shingrix -Current therapy:  Preservision 2 1 capsule twice daily Prochlorperazine 10 mg 1 tablet as needed Triamcinolone cream 0.1% as needed Vitamin C 1000 mcg 1 tablet daily Vitamin D 5000 units every other day -Educated on Cost vs benefit of each product must be carefully weighed by individual consumer Supplements may interfere with prescription drugs -Patient is satisfied with current therapy and denies issues -Recommended to continue current medication  Patient Goals/Self-Care Activities Patient will:  - take medications as prescribed as evidenced by patient report and record review check glucose weekly, document, and provide at future appointments check blood pressure weekly, document, and provide at future appointments  Follow Up Plan: The care management team will reach out to the patient again over the next 14 days.        Medication Assistance: None required.  Patient affirms current coverage meets needs.  Compliance/Adherence/Medication fill history: Care Gaps: COVID booster, eye exam BP - 130/70 ( 11/03/21) A1c: 6.2% (10/23/21)  Star-Rating Drugs: Losartan (Cozaar) 100 mg - Last filled 10/12/21 90 DS at [facility]  Patient's preferred pharmacy is:  [place], [place] [address] Phone:  [phone] Fax: [phone]  [id number]  [place], [place] [address]  [place] [address] Phone: [phone] Fax: [phone]   Uses pill box? No - has them all in one container  Pt endorses 99% compliance   We discussed: Current pharmacy is preferred with insurance plan and patient is satisfied with pharmacy services Patient decided to: Continue current medication management strategy  Care Plan and Follow Up Patient Decision:  Patient agrees to Care Plan and Follow-up.  Plan: Telephone follow up appointment with care management team member scheduled for:  6 months  [name], PharmD, [place] [phone]

## 2021-11-24 ENCOUNTER — Telehealth: Payer: Self-pay

## 2021-11-24 NOTE — Telephone Encounter (Signed)
Pt. Was in office at check out he wanted me to let you know that his prednisone at 10 mg is doing fine he is doing much better. He has no pain from arthritis now.

## 2021-11-25 DIAGNOSIS — E119 Type 2 diabetes mellitus without complications: Secondary | ICD-10-CM

## 2021-11-25 DIAGNOSIS — I10 Essential (primary) hypertension: Secondary | ICD-10-CM

## 2021-11-25 DIAGNOSIS — N400 Enlarged prostate without lower urinary tract symptoms: Secondary | ICD-10-CM

## 2021-11-25 DIAGNOSIS — E785 Hyperlipidemia, unspecified: Secondary | ICD-10-CM

## 2021-11-25 DIAGNOSIS — Z87891 Personal history of nicotine dependence: Secondary | ICD-10-CM

## 2021-12-04 ENCOUNTER — Other Ambulatory Visit: Payer: Self-pay | Admitting: Family Medicine

## 2021-12-04 DIAGNOSIS — I10 Essential (primary) hypertension: Secondary | ICD-10-CM

## 2021-12-21 ENCOUNTER — Other Ambulatory Visit: Payer: Self-pay | Admitting: Family Medicine

## 2021-12-21 DIAGNOSIS — E1159 Type 2 diabetes mellitus with other circulatory complications: Secondary | ICD-10-CM

## 2021-12-21 NOTE — Telephone Encounter (Signed)
Is this okay to refill? He has an appt tomorrow.

## 2021-12-22 ENCOUNTER — Ambulatory Visit (INDEPENDENT_AMBULATORY_CARE_PROVIDER_SITE_OTHER): Payer: Medicare Other | Admitting: Family Medicine

## 2021-12-22 ENCOUNTER — Encounter: Payer: Self-pay | Admitting: Family Medicine

## 2021-12-22 VITALS — BP 110/60 | HR 77 | Wt 249.2 lb

## 2021-12-22 DIAGNOSIS — M1990 Unspecified osteoarthritis, unspecified site: Secondary | ICD-10-CM

## 2021-12-22 MED ORDER — PREDNISONE 5 MG PO TABS: 5.0000 mg | ORAL_TABLET | Freq: Every day | ORAL | 0 refills | Status: DC

## 2021-12-28 ENCOUNTER — Other Ambulatory Visit: Payer: Self-pay

## 2021-12-28 ENCOUNTER — Inpatient Hospital Stay: Payer: Medicare Other | Attending: Internal Medicine

## 2021-12-28 ENCOUNTER — Ambulatory Visit (HOSPITAL_COMMUNITY)
Admission: RE | Admit: 2021-12-28 | Discharge: 2021-12-28 | Disposition: A | Discharge status: Home / Self Care | Payer: Medicare Other | Source: Ambulatory Visit | Attending: Internal Medicine | Admitting: Internal Medicine

## 2021-12-28 DIAGNOSIS — N210 Calculus in bladder: Secondary | ICD-10-CM | POA: Diagnosis not present

## 2021-12-28 DIAGNOSIS — J439 Emphysema, unspecified: Secondary | ICD-10-CM | POA: Insufficient documentation

## 2021-12-28 DIAGNOSIS — E039 Hypothyroidism, unspecified: Secondary | ICD-10-CM | POA: Diagnosis not present

## 2021-12-28 DIAGNOSIS — J984 Other disorders of lung: Secondary | ICD-10-CM | POA: Insufficient documentation

## 2021-12-28 DIAGNOSIS — I700 Atherosclerosis of aorta: Secondary | ICD-10-CM | POA: Insufficient documentation

## 2021-12-28 DIAGNOSIS — E785 Hyperlipidemia, unspecified: Secondary | ICD-10-CM | POA: Insufficient documentation

## 2021-12-28 DIAGNOSIS — J432 Centrilobular emphysema: Secondary | ICD-10-CM | POA: Diagnosis not present

## 2021-12-28 DIAGNOSIS — C3490 Malignant neoplasm of unspecified part of unspecified bronchus or lung: Secondary | ICD-10-CM | POA: Insufficient documentation

## 2021-12-28 DIAGNOSIS — R911 Solitary pulmonary nodule: Secondary | ICD-10-CM | POA: Insufficient documentation

## 2021-12-28 DIAGNOSIS — M1990 Unspecified osteoarthritis, unspecified site: Secondary | ICD-10-CM | POA: Insufficient documentation

## 2021-12-28 DIAGNOSIS — N3289 Other specified disorders of bladder: Secondary | ICD-10-CM | POA: Diagnosis not present

## 2021-12-28 DIAGNOSIS — Z79890 Hormone replacement therapy (postmenopausal): Secondary | ICD-10-CM | POA: Insufficient documentation

## 2021-12-28 DIAGNOSIS — C3412 Malignant neoplasm of upper lobe, left bronchus or lung: Secondary | ICD-10-CM | POA: Insufficient documentation

## 2021-12-28 DIAGNOSIS — Z79899 Other long term (current) drug therapy: Secondary | ICD-10-CM | POA: Insufficient documentation

## 2021-12-28 DIAGNOSIS — Z86010 Personal history of colonic polyps: Secondary | ICD-10-CM | POA: Insufficient documentation

## 2021-12-28 DIAGNOSIS — E1142 Type 2 diabetes mellitus with diabetic polyneuropathy: Secondary | ICD-10-CM | POA: Insufficient documentation

## 2021-12-28 DIAGNOSIS — Z7952 Long term (current) use of systemic steroids: Secondary | ICD-10-CM | POA: Insufficient documentation

## 2021-12-28 DIAGNOSIS — Z85828 Personal history of other malignant neoplasm of skin: Secondary | ICD-10-CM | POA: Insufficient documentation

## 2021-12-28 DIAGNOSIS — N281 Cyst of kidney, acquired: Secondary | ICD-10-CM | POA: Diagnosis not present

## 2021-12-28 DIAGNOSIS — I10 Essential (primary) hypertension: Secondary | ICD-10-CM | POA: Insufficient documentation

## 2021-12-28 DIAGNOSIS — Z87891 Personal history of nicotine dependence: Secondary | ICD-10-CM | POA: Insufficient documentation

## 2021-12-28 LAB — CMP (CANCER CENTER ONLY)
ALT: 14 U/L (ref 0–44)
AST: 14 U/L — ABNORMAL LOW (ref 15–41)
Albumin: 4.2 g/dL (ref 3.5–5.0)
Alkaline Phosphatase: 56 U/L (ref 38–126)
Anion gap: 7 (ref 5–15)
BUN: 30 mg/dL — ABNORMAL HIGH (ref 8–23)
CO2: 30 mmol/L (ref 22–32)
Calcium: 9.5 mg/dL (ref 8.9–10.3)
Chloride: 101 mmol/L (ref 98–111)
Creatinine: 1.47 mg/dL — ABNORMAL HIGH (ref 0.61–1.24)
GFR, Estimated: 47 mL/min — ABNORMAL LOW (ref >60)
Glucose, Bld: 113 mg/dL — ABNORMAL HIGH (ref 70–99)
Potassium: 4.8 mmol/L (ref 3.5–5.1)
Sodium: 138 mmol/L (ref 135–145)
Total Bilirubin: 0.4 mg/dL (ref 0.3–1.2)
Total Protein: 7.1 g/dL (ref 6.5–8.1)

## 2021-12-28 LAB — CBC WITH DIFFERENTIAL (CANCER CENTER ONLY)
Abs Immature Granulocytes: 0.03 10*3/uL (ref 0.00–0.07)
Basophils Absolute: 0 10*3/uL (ref 0.0–0.1)
Basophils Relative: 0 %
Eosinophils Absolute: 0.1 10*3/uL (ref 0.0–0.5)
Eosinophils Relative: 1 %
HCT: 36.1 % — ABNORMAL LOW (ref 39.0–52.0)
Hemoglobin: 12.3 g/dL — ABNORMAL LOW (ref 13.0–17.0)
Immature Granulocytes: 0 %
Lymphocytes Relative: 18 %
Lymphs Abs: 1.4 10*3/uL (ref 0.7–4.0)
MCH: 32.5 pg (ref 26.0–34.0)
MCHC: 34.1 g/dL (ref 30.0–36.0)
MCV: 95.3 fL (ref 80.0–100.0)
Monocytes Absolute: 0.7 10*3/uL (ref 0.1–1.0)
Monocytes Relative: 9 %
Neutro Abs: 5.5 10*3/uL (ref 1.7–7.7)
Neutrophils Relative %: 72 %
Platelet Count: 212 10*3/uL (ref 150–400)
RBC: 3.79 MIL/uL — ABNORMAL LOW (ref 4.22–5.81)
RDW: 12.9 % (ref 11.5–15.5)
WBC Count: 7.6 10*3/uL (ref 4.0–10.5)
nRBC: 0 % (ref 0.0–0.2)

## 2021-12-28 LAB — TSH: TSH: 3.193 u[IU]/mL (ref 0.350–4.500)

## 2021-12-28 MED ORDER — IOHEXOL 300 MG/ML  SOLN
75.0000 mL | Freq: Once | INTRAMUSCULAR | Status: AC | PRN
  Administered 2021-12-28: 75 mL via INTRAVENOUS

## 2021-12-28 MED ORDER — SODIUM CHLORIDE (PF) 0.9 % IJ SOLN
INTRAMUSCULAR | Status: AC
  Filled 2021-12-28: qty 50

## 2021-12-30 ENCOUNTER — Inpatient Hospital Stay: Payer: Medicare Other | Admitting: Internal Medicine

## 2021-12-30 ENCOUNTER — Other Ambulatory Visit: Payer: Self-pay

## 2021-12-30 VITALS — BP 145/59 | HR 90 | Temp 98.0°F | Resp 18 | Wt 250.4 lb

## 2021-12-30 DIAGNOSIS — Z79890 Hormone replacement therapy (postmenopausal): Secondary | ICD-10-CM | POA: Diagnosis not present

## 2021-12-30 DIAGNOSIS — C3490 Malignant neoplasm of unspecified part of unspecified bronchus or lung: Secondary | ICD-10-CM

## 2021-12-30 DIAGNOSIS — Z85828 Personal history of other malignant neoplasm of skin: Secondary | ICD-10-CM | POA: Diagnosis not present

## 2021-12-30 DIAGNOSIS — E785 Hyperlipidemia, unspecified: Secondary | ICD-10-CM | POA: Diagnosis not present

## 2021-12-30 DIAGNOSIS — Z87891 Personal history of nicotine dependence: Secondary | ICD-10-CM | POA: Diagnosis not present

## 2021-12-30 DIAGNOSIS — I10 Essential (primary) hypertension: Secondary | ICD-10-CM | POA: Diagnosis not present

## 2021-12-30 DIAGNOSIS — C3412 Malignant neoplasm of upper lobe, left bronchus or lung: Secondary | ICD-10-CM | POA: Diagnosis not present

## 2021-12-30 DIAGNOSIS — Z7952 Long term (current) use of systemic steroids: Secondary | ICD-10-CM | POA: Diagnosis not present

## 2021-12-30 DIAGNOSIS — M1990 Unspecified osteoarthritis, unspecified site: Secondary | ICD-10-CM | POA: Diagnosis not present

## 2021-12-30 DIAGNOSIS — C3492 Malignant neoplasm of unspecified part of left bronchus or lung: Secondary | ICD-10-CM

## 2021-12-30 DIAGNOSIS — E1142 Type 2 diabetes mellitus with diabetic polyneuropathy: Secondary | ICD-10-CM | POA: Diagnosis not present

## 2021-12-30 DIAGNOSIS — J439 Emphysema, unspecified: Secondary | ICD-10-CM | POA: Diagnosis not present

## 2021-12-30 DIAGNOSIS — Z86010 Personal history of colonic polyps: Secondary | ICD-10-CM | POA: Diagnosis not present

## 2021-12-30 DIAGNOSIS — Z79899 Other long term (current) drug therapy: Secondary | ICD-10-CM | POA: Diagnosis not present

## 2021-12-30 NOTE — Progress Notes (Signed)
[facility] [phone]   [phone]  OFFICE PROGRESS NOTE  [name], [address] [address]  DIAGNOSIS: Stage IV (T3, N2, M1c) non-small cell lung cancer, squamous cell carcinoma presented with large left upper lobe lung mass in addition to left hilar and mediastinal lymphadenopathy as well as bilateral pulmonary nodules and left adrenal gland lesion diagnosed in May 2022.  PRIOR THERAPY: Palliative systemic chemotherapy with carboplatin for an AUC of 5, paclitaxel 175 mg/m2 and Keytruda 200 mg IV every 3 weeks.  First dose expected on 12/22/2020. Status post 9 cycles.  Starting from cycle #5 the patient will be on maintenance treatment with single agent Keytruda 200 Mg IV every 3 weeks.  This treatment is currently on hold secondary to intolerance with significant itching and neuropathy.  CURRENT THERAPY: Observation   INTERVAL HISTORY: BOY DELAMATER Sr 86 y.o. male returns to the clinic today for follow-up visit accompanied by his wife.  The patient is feeling fine today with no concerning complaints except for the peripheral neuropathy especially in the toes.  He denied having any chest pain but has shortness of breath with exertion with no cough or hemoptysis.  He has no nausea, vomiting, diarrhea or constipation.  He has no headache or visual changes.  He denied having any fever or chills.  He is currently on observation.  He had repeat CT scan of the chest, abdomen and pelvis performed recently and he is here for evaluation and discussion of his scan results.  MEDICAL HISTORY: Past Medical History:  Diagnosis Date   BCE (basal cell epithelioma)    Bruises easily    on hands   Cough    smokers cough or perfurmes   Degeneration macular    right eye   Diabetes mellitus without complication (HCC)    Dyslipidemia    ED (erectile dysfunction)    Hx of adenomatous colonic polyps    Hyperlipidemia    Hypertension     borderline   Obesity    Psoriasis    Seasonal allergies    Smoker    former    ALLERGIES:  is allergic to paclitaxel, vancomycin, amlodipine, and latex.  MEDICATIONS:  Current Outpatient Medications  Medication Sig Dispense Refill   acetaminophen (TYLENOL) 500 MG tablet Take 500 mg by mouth every 6 (six) hours as needed.     Ascorbic Acid (VITAMIN C) 1000 MG tablet Take 1,000 mg by mouth daily.     Cholecalciferol (VITAMIN D-3) 125 MCG (5000 UT) TABS Take 1 tablet by mouth every other day.     hydrochlorothiazide (MICROZIDE) 12.5 MG capsule TAKE ONE CAPSULE BY MOUTH DAILY 90 capsule 1   HYDROcodone-acetaminophen (NORCO) 5-325 MG tablet Take 1 tablet by mouth every 6 (six) hours as needed for moderate pain. (Patient not taking: Reported on 11/12/2021) 12 tablet 0   ketoconazole (NIZORAL) 2 % cream Apply 1 application. topically daily. 30 g 1   levothyroxine (SYNTHROID) 50 MCG tablet Take 1 tablet (50 mcg total) by mouth daily before breakfast. (Patient not taking: Reported on 12/22/2021) 30 tablet 2   loratadine (CLARITIN) 10 MG tablet Take 10 mg by mouth daily.     losartan (COZAAR) 100 MG tablet Take 1 tablet (100 mg total) by mouth daily. 90 tablet 1   metoprolol tartrate (LOPRESSOR) 50 MG tablet TAKE ONE TABLET BY MOUTH TWICE DAILY 180 tablet 0   Multiple Vitamins-Minerals (PRESERVISION AREDS 2) CAPS Take  1 capsule by mouth in the morning and at bedtime.     [place] Patient is to test one time a day DX: E11.9 100 each 4   ONETOUCH VERIO test strip USE TO TEST BLOOD SUGAR ONCE DAILY. 50 strip 3   predniSONE (DELTASONE) 5 MG tablet Take 1 tablet (5 mg total) by mouth daily with breakfast. 30 tablet 0   tamsulosin (FLOMAX) 0.4 MG CAPS capsule Take 1 capsule (0.4 mg total) by mouth daily after supper. 30 capsule 3   triamcinolone cream (KENALOG) 0.1 % APPLY TO AFFECTED AREA TWICE A DAY. 454 g 0   No current facility-administered medications for this visit.     SURGICAL HISTORY:  Past Surgical History:  Procedure Laterality Date   BRONCHIAL BRUSHINGS  11/18/2020   Procedure: BRONCHIAL BRUSHINGS;  Surgeon: Spero Geralds, MD;  Location: [facility];  Service: Pulmonary;;   BRONCHIAL NEEDLE ASPIRATION BIOPSY  11/18/2020   Procedure: BRONCHIAL NEEDLE ASPIRATION BIOPSIES;  Surgeon: Spero Geralds, MD;  Location: [facility];  Service: Pulmonary;;   BRONCHIAL WASHINGS  11/18/2020   Procedure: BRONCHIAL WASHINGS;  Surgeon: Spero Geralds, MD;  Location: [facility];  Service: Pulmonary;;   CATARACT EXTRACTION, BILATERAL Bilateral 2013   COLONOSCOPY  2005   [facility]   ENDOBRONCHIAL ULTRASOUND N/A 11/18/2020   Procedure: ENDOBRONCHIAL ULTRASOUND;  Surgeon: Spero Geralds, MD;  Location: [facility];  Service: Pulmonary;  Laterality: N/A;   POLYPECTOMY     SKIN CANCER EXCISION Right 1993   under eye-MOHS, freeze multiple places freq   TONSILLECTOMY  as child   TOTAL HIP ARTHROPLASTY Left 01/04/2014   Procedure: LEFT TOTAL HIP ARTHROPLASTY ANTERIOR APPROACH;  Surgeon: Mcarthur Rossetti, MD;  Location: [facility];  Service: Orthopedics;  Laterality: Left;   TOTAL HIP ARTHROPLASTY Left 02/14/2014   Procedure: Irrigation and Debridement left hip;  Surgeon: Mcarthur Rossetti, MD;  Location: [facility];  Service: Orthopedics;  Laterality: Left;    REVIEW OF SYSTEMS:  A comprehensive review of systems was negative except for: Constitutional: positive for fatigue Neurological: positive for paresthesia   PHYSICAL EXAMINATION: General appearance: alert, cooperative, fatigued, and no distress Head: Normocephalic, without obvious abnormality, atraumatic Neck: no adenopathy, no JVD, supple, symmetrical, trachea midline, and thyroid not enlarged, symmetric, no tenderness/mass/nodules Lymph nodes: Cervical, supraclavicular, and axillary nodes normal. Resp: clear to auscultation bilaterally Back: symmetric, no curvature. ROM normal. No CVA  tenderness. Cardio: regular rate and rhythm, S1, S2 normal, no murmur, click, rub or gallop GI: soft, non-tender; bowel sounds normal; no masses,  no organomegaly Extremities: extremities normal, atraumatic, no cyanosis or edema  ECOG PERFORMANCE STATUS: 1 - Symptomatic but completely ambulatory  Blood pressure (!) 145/59, pulse 90, temperature 98 F (36.7 C), resp. rate 18, weight 250 lb 6.4 oz (113.6 kg), SpO2 95 %.  LABORATORY DATA: Lab Results  Component Value Date   WBC 7.6 12/28/2021   HGB 12.3 (L) 12/28/2021   HCT 36.1 (L) 12/28/2021   MCV 95.3 12/28/2021   PLT 212 12/28/2021      Chemistry      Component Value Date/Time   NA 138 12/28/2021 1517   NA 141 01/29/2020 1157   K 4.8 12/28/2021 1517   CL 101 12/28/2021 1517   CO2 30 12/28/2021 1517   BUN 30 (H) 12/28/2021 1517   BUN 12 01/29/2020 1157   CREATININE 1.47 (H) 12/28/2021 1517   CREATININE 1.11 07/26/2016 0921      Component Value  Date/Time   CALCIUM 9.5 12/28/2021 1517   ALKPHOS 56 12/28/2021 1517   AST 14 (L) 12/28/2021 1517   ALT 14 12/28/2021 1517   BILITOT 0.4 12/28/2021 1517       RADIOGRAPHIC STUDIES: CT Chest W Contrast  Result Date: 12/30/2021 CLINICAL DATA:  Non-small-cell lung cancer.  Restaging. EXAM: CT CHEST WITH CONTRAST TECHNIQUE: Multidetector CT imaging of the chest was performed during intravenous contrast administration. RADIATION DOSE REDUCTION: This exam was performed according to the departmental dose-optimization program which includes automated exposure control, adjustment of the mA and/or kV according to patient size and/or use of iterative reconstruction technique. CONTRAST:  32mL OMNIPAQUE IOHEXOL 300 MG/ML  SOLN COMPARISON:  09/25/2021 FINDINGS: Cardiovascular: The heart size is normal. No substantial pericardial effusion. Coronary artery calcification is evident. Mitral annular calcification evident. Moderate atherosclerotic calcification is noted in the wall of the thoracic  aorta. Mediastinum/Nodes: No mediastinal lymphadenopathy. There is no hilar lymphadenopathy. The esophagus has normal imaging features. There is no axillary lymphadenopathy. Lungs/Pleura: Centrilobular emphsyema noted. Biapical pleuroparenchymal scarring again noted. Peripheral left upper lobe lesion measured previously at 2.7 x 2.1 cm is now 2.3 x 2.1 cm. The no new suspicious pulmonary nodule or mass. 5 mm index nodule in the peripheral right lower lobe (87/6) is stable. Scattered areas of tree-in-bud nodularity in the posterior right upper lobe, right middle lobe, and right lower lobe are similar to prior compatible sequelae of atypical infection. No focal airspace consolidation. No pleural effusion. Musculoskeletal: No worrisome lytic or sclerotic osseous abnormality. IMPRESSION: 1. Continued further decrease in size of the left upper lobe pulmonary lesion. No new or progressive findings. 2. Stable 5 mm index nodule in the peripheral right lower lobe. 3. Scattered areas of tree-in-bud nodularity in the posterior right upper lobe, right middle lobe, and right lower lobe are similar to prior compatible with sequelae of atypical infection. 4. Aortic Atherosclerosis (ICD10-I70.0) and Emphysema (ICD10-J43.9). Electronically Signed   By: Misty Stanley M.D.   On: 12/30/2021 09:42    ASSESSMENT AND PLAN: This is a very pleasant 86 years old white male recently diagnosed with Stage IV (T3, N2, M1c) non-small cell lung cancer, squamous cell carcinoma presented with large left upper lobe lung mass in addition to left hilar and mediastinal lymphadenopathy as well as bilateral pulmonary nodules and left adrenal gland lesion diagnosed in May 2022. The patient is currently undergoing systemic chemotherapy with carboplatin for AUC of 5, paclitaxel 175 Mg/M2 and Keytruda 200 Mg IV every 3 weeks with Neulasta support status post 9 cycles.  Starting from cycle #5 the patient is on maintenance treatment with Keytruda 200 Mg IV  every 3 weeks.  This treatment was discontinued after cycle #5 secondary to persistent itching not responding to medications as well as the peripheral neuropathy. The patient is currently on observation and he is feeling fine except for the mild fatigue and peripheral neuropathy especially in the toes. He had repeat CT scan of the chest, abdomen and pelvis performed recently.  I personally and independently reviewed the scan and discussed results with the patient and his wife. His scan showed no concerning findings for disease progression. I recommended for the patient to continue on observation with repeat CT scan of the chest, abdomen and pelvis in 4-6 months.  The patient would like to come back in 6 months rather than 4 and he knows to call immediately if he has any concerning symptoms in the interval.  He understands that cancer progression can happen  at any time. For the history of COPD and the atypical infection, he will continue his follow-up visit with his pulmonologist. For the peripheral neuropathy and arthritis, he will also see Dr. Redmond School for management.  All questions were answered. The patient knows to call the clinic with any problems, questions or concerns. We can certainly see the patient much sooner if necessary.  Disclaimer: This note was dictated with voice recognition software. Similar sounding words can inadvertently be transcribed and may not be corrected upon review.

## 2022-01-06 ENCOUNTER — Other Ambulatory Visit: Payer: Self-pay | Admitting: Family Medicine

## 2022-01-06 DIAGNOSIS — R32 Unspecified urinary incontinence: Secondary | ICD-10-CM

## 2022-01-18 ENCOUNTER — Other Ambulatory Visit: Payer: Self-pay

## 2022-01-19 ENCOUNTER — Other Ambulatory Visit: Payer: Self-pay | Admitting: Family Medicine

## 2022-01-19 DIAGNOSIS — M1990 Unspecified osteoarthritis, unspecified site: Secondary | ICD-10-CM

## 2022-01-19 NOTE — Telephone Encounter (Signed)
[facility] is requesting to fill pt prednisone. Please advise [facility]

## 2022-02-15 ENCOUNTER — Telehealth: Payer: Self-pay | Admitting: Pharmacist

## 2022-02-15 NOTE — Chronic Care Management (AMB) (Signed)
Chronic Care Management Pharmacy Assistant   Name: Alex Edwards  MRN: [id number] DOB: 01-24-1936  Reason for Encounter: Disease State   Conditions to be addressed/monitored: HTN  Recent office visits:  12/22/21 [name], MD - Patient presented for Arthritis. Changed Prednisone.  Recent consult visits:  12/30/21 [name], MD (Oncology) - Patient presented for Malignant neoplasm of unspecified part of unspecified bronchus or lung and other concerns. No medication changes.  Hospital visits:  None in previous 6 months  Medications: Outpatient Encounter Medications as of 02/15/2022  Medication Sig   acetaminophen (TYLENOL) 500 MG tablet Take 500 mg by mouth every 6 (six) hours as needed.   Ascorbic Acid (VITAMIN C) 1000 MG tablet Take 1,000 mg by mouth daily.   Cholecalciferol (VITAMIN D-3) 125 MCG (5000 UT) TABS Take 1 tablet by mouth every other day.   hydrochlorothiazide (MICROZIDE) 12.5 MG capsule TAKE ONE CAPSULE BY MOUTH DAILY   HYDROcodone-acetaminophen (NORCO) 5-325 MG tablet Take 1 tablet by mouth every 6 (six) hours as needed for moderate pain. (Patient not taking: Reported on 11/12/2021)   ketoconazole (NIZORAL) 2 % cream Apply 1 application. topically daily.   levothyroxine (SYNTHROID) 50 MCG tablet Take 1 tablet (50 mcg total) by mouth daily before breakfast. (Patient not taking: Reported on 12/22/2021)   loratadine (CLARITIN) 10 MG tablet Take 10 mg by mouth daily.   losartan (COZAAR) 100 MG tablet TAKE ONE TABLET BY MOUTH DAILY   metoprolol tartrate (LOPRESSOR) 50 MG tablet TAKE ONE TABLET BY MOUTH TWICE DAILY   Multiple Vitamins-Minerals (PRESERVISION AREDS 2) CAPS Take 1 capsule by mouth in the morning and at bedtime.   [place] Patient is to test one time a day DX: E11.9   ONETOUCH VERIO test strip USE TO TEST BLOOD SUGAR ONCE DAILY.   predniSONE (DELTASONE) 5 MG tablet Take 1 tablet (5 mg total) by mouth daily with breakfast.    tamsulosin (FLOMAX) 0.4 MG CAPS capsule Take 1 capsule (0.4 mg total) by mouth daily after supper.   triamcinolone cream (KENALOG) 0.1 % APPLY TO AFFECTED AREA TWICE A DAY.   No facility-administered encounter medications on file as of 02/15/2022.   Reviewed chart prior to disease state call. Spoke with patient regarding BP  Recent Office Vitals: BP Readings from Last 3 Encounters:  12/30/21 (!) 145/59  12/22/21 110/60  11/03/21 130/70   Pulse Readings from Last 3 Encounters:  12/30/21 90  12/22/21 77  11/03/21 72    Wt Readings from Last 3 Encounters:  12/30/21 250 lb 6.4 oz (113.6 kg)  12/22/21 249 lb 3.2 oz (113 kg)  11/03/21 249 lb 3.2 oz (113 kg)     Kidney Function Lab Results  Component Value Date/Time   CREATININE 1.47 (H) 12/28/2021 03:17 PM   CREATININE 1.28 (H) 09/25/2021 02:37 PM   CREATININE 1.11 07/26/2016 09:21 AM   CREATININE 0.95 01/12/2013 10:09 AM   GFRNONAA 47 (L) 12/28/2021 03:17 PM   GFRAA 84 01/29/2020 11:57 AM       Latest Ref Rng & Units 12/28/2021    3:17 PM 09/25/2021    2:37 PM 06/30/2021    2:31 PM  BMP  Glucose 70 - 99 mg/dL 113  88  99   BUN 8 - 23 mg/dL 30  25  26    Creatinine 0.61 - 1.24 mg/dL 1.47  1.28  1.45   Sodium 135 - 145 mmol/L 138  136  138  Potassium 3.5 - 5.1 mmol/L 4.8  4.7  4.6   Chloride 98 - 111 mmol/L 101  101  106   CO2 22 - 32 mmol/L 30  27  26    Calcium 8.9 - 10.3 mg/dL 9.5  10.1  9.3     Current antihypertensive regimen:  Losartan 100 mg 1 tablet daily - in AM - Appropriate, Effective, Safe, Accessible Hydrochlorothiazide 12.5 mg 1 capsule daily - in AM - Appropriate, Effective, Safe, Accessible Metoprolol tartrate 50 mg 1 tablet twice daily - Appropriate, Effective, Safe, Accessible How often are you checking your Blood Pressure? weekly Current home BP readings: 130/70 Patent denies ay hyper/hypotensive symptoms. What recent interventions/DTPs have been made by any provider to improve Blood Pressure control  since last CPP Visit: Patient reports none Any recent hospitalizations or ED visits since last visit with CPP? No Patient reports his main issue/ concern is he has bad neuropathy of feet and hands due to Chemo, he reports he will be seeing his PCP this week also, he otherwise reports he is doing well.  Adherence Review: Is the patient currently on ACE/ARB medication? Yes Does the patient have >5 day gap between last estimated fill dates? No    Care Gaps: Eye Exam - Overdue COVID Booster - Overdue Flu Vaccine - Overdue TDAP - Overdue CCM- Decl at this time to see PCP soon BP- 110/60 12/22/21 AWV-  2/23 Lab Results  Component Value Date   HGBA1C 6.2 (A) 10/23/2021    Star Rating Drugs: Losartan (Cozaar) 100 mg - Last filled 01/06/22 90 DS at [facility] Pharmacist Assistant [phone]

## 2022-02-19 ENCOUNTER — Encounter: Payer: Self-pay | Admitting: Family Medicine

## 2022-02-19 ENCOUNTER — Other Ambulatory Visit: Payer: Self-pay

## 2022-02-19 ENCOUNTER — Ambulatory Visit (INDEPENDENT_AMBULATORY_CARE_PROVIDER_SITE_OTHER): Payer: Medicare Other | Admitting: Family Medicine

## 2022-02-19 VITALS — BP 110/66 | HR 81 | Temp 98.1°F | Wt 251.6 lb

## 2022-02-19 DIAGNOSIS — I152 Hypertension secondary to endocrine disorders: Secondary | ICD-10-CM | POA: Diagnosis not present

## 2022-02-19 DIAGNOSIS — E1159 Type 2 diabetes mellitus with other circulatory complications: Secondary | ICD-10-CM | POA: Diagnosis not present

## 2022-02-19 DIAGNOSIS — I10 Essential (primary) hypertension: Secondary | ICD-10-CM

## 2022-02-19 DIAGNOSIS — Z789 Other specified health status: Secondary | ICD-10-CM | POA: Diagnosis not present

## 2022-02-19 DIAGNOSIS — E119 Type 2 diabetes mellitus without complications: Secondary | ICD-10-CM | POA: Diagnosis not present

## 2022-02-19 DIAGNOSIS — C3492 Malignant neoplasm of unspecified part of left bronchus or lung: Secondary | ICD-10-CM

## 2022-02-19 DIAGNOSIS — E1169 Type 2 diabetes mellitus with other specified complication: Secondary | ICD-10-CM | POA: Diagnosis not present

## 2022-02-19 DIAGNOSIS — E785 Hyperlipidemia, unspecified: Secondary | ICD-10-CM | POA: Diagnosis not present

## 2022-02-19 DIAGNOSIS — I700 Atherosclerosis of aorta: Secondary | ICD-10-CM | POA: Diagnosis not present

## 2022-02-19 LAB — POCT GLYCOSYLATED HEMOGLOBIN (HGB A1C): Hemoglobin A1C: 6.3 % — AB (ref 4.0–5.6)

## 2022-02-19 MED ORDER — METOPROLOL TARTRATE 50 MG PO TABS: 50.0000 mg | ORAL_TABLET | Freq: Two times a day (BID) | ORAL | 1 refills | Status: DC

## 2022-02-19 NOTE — Progress Notes (Signed)
Subjective:    Patient ID: Alex Edwards, male    DOB: Mar 10, 1936, 86 y.o.   MRN: [id number]  Alex Edwards is a 87 y.o. male who presents for follow-up of Type 2 diabetes mellitus.  Patient is checking home blood sugars.   Home blood sugar records: BGs have been labile ranging between 90 and 150 How often is blood sugars being checked: QD fasting and post meal sometimes  Current symptoms/problems include none and have been unchanged. Daily foot checks: yes   Any foot concerns: pain in both feet  Last eye exam: over two years  Exercise:  trying to stay active but gives out of breath quick He is not taking 5 mg of prednisone for treatment of presumed PMR although his sed rate was in the normal range.  He responded relatively quickly to the steroids  He is statin intolerant.  Does have a history of aortic atherosclerosis.  He continues on losartan and metoprolol and HCTZ.Marland Kitchen  Also taking Claritin for his underlying allergies.  He does complain of difficulty with decreased sensation in both feet as well as his right hand and relates this to when he was placed on Keytruda.  He states that it does somewhat interfere with his walking and that he feels unstable on therefore keeps him being as active as he would like to be.  He was seen by oncology and a recent CT scan was reviewed which did show decrease in size of the tumor.  He is scheduled for 47-month follow-up with Dr. Julien Nordmann. The following portions of the patient's history were reviewed and updated as appropriate: allergies, current medications, past medical history, past social history and problem list.  ROS as in subjective above.     Objective:    Physical Exam Alert and in no distress.  Foot exam shows poor pulses however normal sensation.  Hemoglobin A1c is 6.3. Blood pressure 110/66, pulse 81, temperature 98.1 F (36.7 C), weight 251 lb 9.6 oz (114.1 kg), SpO2 95 %.  Lab Review    Latest Ref Rng & Units 12/28/2021    3:17 PM  10/23/2021   11:15 AM 09/25/2021    2:37 PM 06/30/2021    2:31 PM 06/08/2021    9:52 AM  Diabetic Labs  HbA1c 4.0 - 5.6 %  6.2      Creatinine 0.61 - 1.24 mg/dL 1.47   1.28  1.45  1.25       02/19/2022   11:06 AM 12/30/2021    9:57 AM 12/22/2021   11:15 AM 11/03/2021    2:24 PM 11/02/2021    4:18 PM  BP/Weight  Systolic BP 350 093 818 299 371  Diastolic BP 66 59 60 70 68  Wt. (Lbs) 251.6 250.4 249.2 249.2 250  BMI 33.19 kg/m2 33.04 kg/m2 32.88 kg/m2 32.88 kg/m2 32.98 kg/m2      06/01/2021   11:15 AM 01/29/2021   11:00 AM  Foot/eye exam completion dates  Foot Form Completion Done Done    Baptiste  reports that he quit smoking about 7 years ago. His smoking use included cigarettes. He has a 50.00 pack-year smoking history. He has never used smokeless tobacco. He reports that he does not drink alcohol and does not use drugs.     Assessment & Plan:    Controlled type 2 diabetes mellitus without complication, without long-term current use of insulin (HCC) - Plan: POCT glycosylated hemoglobin (Hb A1C)  Aortic atherosclerosis (HCC)  Hypertension associated with diabetes (  [place])  Stage IV squamous cell carcinoma of left lung ([place])  Hyperlipidemia associated with type 2 diabetes mellitus (HCC)  Statin intolerance His diabetes seems to be under good control.  Did recommend that he go ahead and stop the prednisone when he finishes his present dosing regimen however if he has further difficulty with aches and pains let me know.  Although symptoms sound like PMR not sure that that was truly the case but some other inflammatory arthropathy.  Discussed follow-up on the decreased sensation in his feet and hands in terms of referral to neurology but however he is decided to not pursue it at this point.  Follow-up here in 6 months unless the arthropathy symptoms get worse.

## 2022-02-20 ENCOUNTER — Other Ambulatory Visit: Payer: Self-pay

## 2022-03-03 ENCOUNTER — Encounter: Payer: Self-pay | Admitting: Internal Medicine

## 2022-03-18 ENCOUNTER — Encounter: Payer: Self-pay | Admitting: *Deleted

## 2022-03-18 NOTE — Progress Notes (Signed)
[facility] Quality Team Note  Name: CAPRICE WASKO Sr Date of Birth: 05-07-1936 MRN: [id number] Date: 03/18/2022  [employer] Quality Team has reviewed this patient's chart, please see recommendations below:  [facility] Quality Other; KED: Kidney Health Evaluation Gap- Patient needs Urine Albumin Creatinine Ratio Test completed for gap closure. EGFR has already been completed.  Called pt and offered to mail kit.  Pt declined and said he will call office next week to arrange.

## 2022-03-22 NOTE — Progress Notes (Signed)
Is this ok?

## 2022-03-26 ENCOUNTER — Telehealth: Payer: Self-pay | Admitting: Licensed Clinical Social Worker

## 2022-03-26 NOTE — Patient Outreach (Signed)
  Care Coordination   Initial Visit Note   03/26/2022 Name: Alex Edwards MRN: [id number] DOB: 27-Aug-1935  Alex Edwards is a 86 y.o. year old male who sees Alex Edwards, Alex Jarvis, MD for primary care. I spoke with  Alex Edwards by phone today.  What matters to the patients health and wellness today?  Care Coordination    Goals Addressed             This Visit's Progress    COMPLETED: Care Coordination Activities-No Follow Up Required       Care Coordination Interventions: Active listening / Reflection utilized  LCSW informed patient of care coordination services. Pt is not interested at this time and agreed to contact PCP, should needs arise Reviewed upcoming appt Assessed for SDOH. Pt is not in need of additional services         SDOH assessments and interventions completed:  Yes  SDOH Interventions Today    Flowsheet Row Most Recent Value  SDOH Interventions   Housing Interventions Intervention Not Indicated  Transportation Interventions Intervention Not Indicated        Care Coordination Interventions Activated:  Yes  Care Coordination Interventions:  Yes, provided   Follow up plan: No further intervention required.   Encounter Outcome:  Pt. Refused   Christa See, MSW, [email or web address] Phone [phone] 3:24 PM

## 2022-03-26 NOTE — Patient Instructions (Signed)
Visit Information  Thank you for taking time to visit with me today. Please don't hesitate to contact me if I can be of assistance to you.   Following are the goals we discussed today:   Goals Addressed             This Visit's Progress    COMPLETED: Care Coordination Activities-No Follow Up Required       Care Coordination Interventions: Active listening / Reflection utilized  LCSW informed patient of care coordination services. Pt is not interested at this time and agreed to contact PCP, should needs arise Reviewed upcoming appt Assessed for SDOH. Pt is not in need of additional services         If you are experiencing a Mental Health or [place] or need someone to talk to, please call the Suicide and Crisis Lifeline: 988 call 911   Patient verbalizes understanding of instructions and care plan provided today and agrees to view in [place]. Active MyChart status and patient understanding of how to access instructions and care plan via MyChart confirmed with patient.     No further follow up required:    Alex Edwards, MSW, [email or web address] Phone [phone] 3:24 PM

## 2022-03-29 ENCOUNTER — Other Ambulatory Visit: Payer: Self-pay | Admitting: Family Medicine

## 2022-03-29 NOTE — Telephone Encounter (Signed)
[facility] is requesting to fill pt ketoconazole cream. Please advise [facility]

## 2022-04-06 ENCOUNTER — Encounter: Payer: Self-pay | Admitting: Internal Medicine

## 2022-04-07 ENCOUNTER — Other Ambulatory Visit (INDEPENDENT_AMBULATORY_CARE_PROVIDER_SITE_OTHER): Payer: Medicare Other

## 2022-04-07 DIAGNOSIS — Z23 Encounter for immunization: Secondary | ICD-10-CM

## 2022-04-19 ENCOUNTER — Encounter: Payer: Self-pay | Admitting: Internal Medicine

## 2022-04-24 DIAGNOSIS — C3492 Malignant neoplasm of unspecified part of left bronchus or lung: Secondary | ICD-10-CM | POA: Diagnosis not present

## 2022-04-24 DIAGNOSIS — J9601 Acute respiratory failure with hypoxia: Secondary | ICD-10-CM | POA: Diagnosis not present

## 2022-05-12 ENCOUNTER — Telehealth: Payer: Self-pay | Admitting: Pharmacist

## 2022-05-12 NOTE — Chronic Care Management (AMB) (Signed)
    Chronic Care Management Pharmacy Assistant   Name: Alex Edwards  MRN: [id number] DOB: 26-May-1936  05/12/22 APPOINTMENT REMINDER   Patient has checked in and is aware to have all medications, supplements and any blood glucose and blood pressure readings available for review with [name], Pharm. D, for telephone visit on 05/13/22 at 10:30.    Care Gaps: Eye Exam - Overdue COVID Booster - Overdue TDAP - Overdue BP- 110/66 02/19/22 AWV- 2/23 Lab Results  Component Value Date   HGBA1C 6.3 (A) 02/19/2022    Star Rating Drug: Losartan (Cozaar) 100 mg - Last filled 04/21/22 90 DS at [facility]     Medications: Outpatient Encounter Medications as of 05/12/2022  Medication Sig Note   acetaminophen (TYLENOL) 500 MG tablet Take 500 mg by mouth every 6 (six) hours as needed. 02/19/2022: Prn last dose over one month   Ascorbic Acid (VITAMIN C) 1000 MG tablet Take 1,000 mg by mouth daily.    Cholecalciferol (VITAMIN D-3) 125 MCG (5000 UT) TABS Take 1 tablet by mouth every other day.    hydrochlorothiazide (MICROZIDE) 12.5 MG capsule TAKE ONE CAPSULE BY MOUTH DAILY    HYDROcodone-acetaminophen (NORCO) 5-325 MG tablet Take 1 tablet by mouth every 6 (six) hours as needed for moderate pain. (Patient not taking: Reported on 11/12/2021)    ketoconazole (NIZORAL) 2 % cream Apply 1 application topically daily.    levothyroxine (SYNTHROID) 50 MCG tablet Take 1 tablet (50 mcg total) by mouth daily before breakfast. (Patient not taking: Reported on 12/22/2021)    loratadine (CLARITIN) 10 MG tablet Take 10 mg by mouth daily.    losartan (COZAAR) 100 MG tablet TAKE ONE TABLET BY MOUTH DAILY    metoprolol tartrate (LOPRESSOR) 50 MG tablet Take 1 tablet (50 mg total) by mouth 2 (two) times daily.    Multiple Vitamins-Minerals (PRESERVISION AREDS 2) CAPS Take 1 capsule by mouth in the morning and at bedtime.    [place] Patient is to test one time a day DX: E11.9     ONETOUCH VERIO test strip USE TO TEST BLOOD SUGAR ONCE DAILY.    predniSONE (DELTASONE) 5 MG tablet Take 1 tablet (5 mg total) by mouth daily with breakfast.    tamsulosin (FLOMAX) 0.4 MG CAPS capsule Take 1 capsule (0.4 mg total) by mouth daily after supper.    triamcinolone cream (KENALOG) 0.1 % APPLY TO AFFECTED AREA TWICE A DAY. (Patient not taking: Reported on 02/19/2022)    No facility-administered encounter medications on file as of 05/12/2022.       Jim [place] Clinical Pharmacist Assistant [phone]

## 2022-05-12 NOTE — Progress Notes (Signed)
Chronic Care Management Pharmacy Note  05/13/2022 Name:  Brylan Dec MRN:  [id number] DOB:  [birth year]  Summary: BP at goal < 140/90 TSH is at goal but patient is no longer taking levothyroxine  Recommendations/Changes made from today's visit: -Reached out to oncology to confirm pt stopping levothyroxine -Recommend PFTs given SOB and productive cough -Consider trial of low dose gabapentin for neuropathy  Plan: BP/DM assessment in 3 months Follow up in 6 months  Subjective: JERELLE VIRDEN Sr is an 86 y.o. year old male who is a primary patient of [name], MD.  The CCM team was consulted for assistance with disease management and care coordination needs.    Engaged with patient by telephone for follow up visit in response to provider referral for pharmacy case management and/or care coordination services.   Consent to Services:  The patient was given information about Chronic Care Management services, agreed to services, and gave verbal consent prior to initiation of services.  Please see initial visit note for detailed documentation.   Patient Care Team: [name], MD as PCP - General (Family Medicine) Valrie Hart, RN as Oncology Nurse Navigator (Oncology) Viona Gilmore, [facility] as Pharmacist (Pharmacist) Spero Geralds, MD as Consulting Physician (Pulmonary Disease)  Recent office visits: 02/19/22 Jill Alexanders, MD: Patient presented for DM follow up. Recommended stopping prednisone. Follow up in 6 months.  Recent consult visits: 12/30/21 [name], MD (Oncology) - Patient presented for Malignant neoplasm of unspecified part of unspecified bronchus or lung and other concerns. No medication changes.  Hospital visits: None in previous 6 months  Objective:  Lab Results  Component Value Date   CREATININE 1.47 (H) 12/28/2021   BUN 30 (H) 12/28/2021   GFRNONAA 47 (L) 12/28/2021   GFRAA 84 01/29/2020   NA 138 12/28/2021   K 4.8 12/28/2021    CALCIUM 9.5 12/28/2021   CO2 30 12/28/2021   GLUCOSE 113 (H) 12/28/2021    Lab Results  Component Value Date/Time   HGBA1C 6.3 (A) 02/19/2022 11:57 AM   HGBA1C 6.2 (A) 10/23/2021 11:15 AM   HGBA1C 6.4 (H) 11/18/2020 04:43 AM   HGBA1C 6.7 (H) 07/26/2016 09:21 AM   MICROALBUR 26.0 06/01/2021 01:00 PM   MICROALBUR 51.8 01/29/2020 02:26 PM    Last diabetic Eye exam: No results found for: "HMDIABEYEEXA"  Last diabetic Foot exam: No results found for: "HMDIABFOOTEX"   Lab Results  Component Value Date   CHOL 187 01/29/2020   HDL 34 (L) 01/29/2020   LDLCALC 122 (H) 01/29/2020   TRIG 174 (H) 01/29/2020   CHOLHDL 5.5 (H) 01/29/2020       Latest Ref Rng & Units 12/28/2021    3:17 PM 09/25/2021    2:37 PM 06/30/2021    2:31 PM  Hepatic Function  Total Protein 6.5 - 8.1 g/dL 7.1  8.0  7.0   Albumin 3.5 - 5.0 g/dL 4.2  4.4  4.1   AST 15 - 41 U/L _0 ALT 0 - 44 U/L _1 Alk Phosphatase 38 - 126 U/L 56  67  68   Total Bilirubin 0.3 - 1.2 mg/dL 0.4  0.4  0.4     Lab Results  Component Value Date/Time   TSH 3.193 12/28/2021 03:17 PM   TSH 3.742 09/25/2021 02:25 PM       Latest Ref Rng & Units 12/28/2021    3:17 PM 09/25/2021  2:37 PM 06/30/2021    2:31 PM  CBC  WBC 4.0 - 10.5 K/uL 7.6  7.5  6.2   Hemoglobin 13.0 - 17.0 g/dL 12.3  12.8  11.9   Hematocrit 39.0 - 52.0 % 36.1  38.6  34.4   Platelets 150 - 400 K/uL 212  209  181     No results found for: "VD25OH"  Clinical ASCVD: Yes  The ASCVD Risk score (Arnett DK, et al., 2019) failed to calculate for the following reasons:   The 2019 ASCVD risk score is only valid for ages 36 to 43       08/21/2021    3:10 PM 01/29/2021   10:59 AM 01/29/2020   10:32 AM  Depression screen PHQ 2/9  Decreased Interest 0 0 0  Down, Depressed, Hopeless 0 0 0  PHQ - 2 Score 0 0 0      Social History   Tobacco Use  Smoking Status Former   Packs/day: 1.00   Years: 50.00   Total pack years: 50.00   Types: Cigarettes   Quit  date: 04/28/2014   Years since quitting: 8.0  Smokeless Tobacco Never   BP Readings from Last 3 Encounters:  02/19/22 110/66  12/30/21 (!) 145/59  12/22/21 110/60   Pulse Readings from Last 3 Encounters:  02/19/22 81  12/30/21 90  12/22/21 77   Wt Readings from Last 3 Encounters:  02/19/22 251 lb 9.6 oz (114.1 kg)  12/30/21 250 lb 6.4 oz (113.6 kg)  12/22/21 249 lb 3.2 oz (113 kg)   BMI Readings from Last 3 Encounters:  02/19/22 33.19 kg/m  12/30/21 33.04 kg/m  12/22/21 32.88 kg/m    Assessment/Interventions: Review of patient past medical history, allergies, medications, health status, including review of consultants reports, laboratory and other test data, was performed as part of comprehensive evaluation and provision of chronic care management services.   SDOH:  (Social Determinants of Health) assessments and interventions performed: Yes  SDOH Interventions    Flowsheet Row Chronic Care Management from 05/13/2022 in [place] Telephone from 03/26/2022 in [place] Management from 05/14/2021 in [place] Interventions     Housing Interventions -- Intervention Not Indicated --  Transportation Interventions -- Intervention Not Indicated Intervention Not Indicated  Financial Strain Interventions Intervention Not Indicated -- Intervention Not Indicated       SDOH Screenings   Food Insecurity: No Food Insecurity (08/21/2021)  Housing: Low Risk  (03/26/2022)  Transportation Needs: No Transportation Needs (03/26/2022)  Depression (PHQ2-9): Low Risk  (08/21/2021)  Financial Resource Strain: Low Risk  (05/13/2022)  Physical Activity: Insufficiently Active (08/21/2021)  Stress: No Stress Concern Present (08/21/2021)  Tobacco Use: Medium Risk (02/19/2022)    CCM Care Plan  Allergies  Allergen Reactions   Paclitaxel Shortness Of Breath    SOB and light headedness, flushing and 8/10  pain in left flank Pepcid and Solumedrol administered.  Able to tolerate remainder of Taxol infusion   Vancomycin     Red Man Syndrome    Amlodipine Swelling   Latex Other (See Comments)    "possibly allergic- dry, itching, rash"    Medications Reviewed Today     Reviewed by Viona Gilmore, [facility] (Pharmacist) on 05/13/22 at [place] List Status: <None>   Medication Order Taking? Sig Documenting Provider Last Dose Status Informant  acetaminophen (TYLENOL) 500 MG tablet [id number]  Take 500 mg by mouth every 6 (six) hours as  needed. [provider]  Active            Med Note Mallie Mussel, [facility]   Fri Feb 19, 2022 10:59 AM) Prn last dose over one month  Ascorbic Acid (VITAMIN C) 1000 MG tablet [id number]  Take 1,000 mg by mouth daily. [provider]  Active Self  Cholecalciferol (VITAMIN D-3) 125 MCG (5000 UT) TABS [id number]  Take 1 tablet by mouth every other day. [provider]  Active   hydrochlorothiazide (MICROZIDE) 12.5 MG capsule [id number]  TAKE ONE CAPSULE BY MOUTH DAILY [name], MD  Active   ketoconazole (NIZORAL) 2 % cream [id number]  Apply 1 application topically daily. [name], MD  Active   levothyroxine (SYNTHROID) 50 MCG tablet [id number] No Take 1 tablet (50 mcg total) by mouth daily before breakfast.  Patient not taking: Reported on 12/22/2021   [name], [name], PA-C Not Taking Active   loratadine (CLARITIN) 10 MG tablet [id number]  Take 10 mg by mouth daily. [provider]  Active   losartan (COZAAR) 100 MG tablet [id number]  TAKE ONE TABLET BY MOUTH DAILY [name], MD  Active   metoprolol tartrate (LOPRESSOR) 50 MG tablet [id number]  Take 1 tablet (50 mg total) by mouth 2 (two) times daily. [name], MD  Active   Multiple Vitamins-Minerals (PRESERVISION AREDS 2) CAPS [id number]  Take 1 capsule by mouth in the morning and at bedtime. [provider]  Active Self  Jonetta Speak LANCETS [place]  [phone]  Patient is to test one time a day DX: E11.9 [name], MD  Active Self  [facility] VERIO test strip [id number]  USE TO TEST BLOOD SUGAR ONCE DAILY. [name], MD  Active   predniSONE (DELTASONE) 5 MG tablet [id number] No Take 1 tablet (5 mg total) by mouth daily with breakfast.  Patient not taking: Reported on 05/13/2022   [name], MD Not Taking Active   tamsulosin [facility]) 0.4 MG CAPS capsule [id number]  Take 1 capsule (0.4 mg total) by mouth daily after supper. [name], MD  Active   triamcinolone cream (KENALOG) 0.1 % [id number] Yes APPLY TO AFFECTED AREA TWICE A DAY.  Patient taking differently: Apply 1 Application topically as needed.   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   Internal carotid artery stenosis, right 11/21/2020   Aortic atherosclerosis ([place]) 02/14/2018   Onychomycosis 09/26/2017   Macular degeneration 01/25/2017   History of cataract surgery 01/25/2017   Statin intolerance 09/22/2016   Controlled type 2 diabetes mellitus without complication, without long-term current use of insulin ([place]) 09/22/2016   Senile purpura ([place]) 01/13/2015   Hx of adenomatous colonic polyps 01/13/2015   Actinic keratosis of multiple sites of head and neck 12/09/2014   Status post THR (total hip replacement) 01/04/2014   Hypertension associated with diabetes ([place]) 10/14/2011   Hyperlipidemia associated with type 2 diabetes mellitus ([place]) 10/14/2011   Psoriasis 10/14/2011   Obesity (BMI 30-39.9) 10/14/2011   Allergic rhinitis due to pollen 10/14/2011   Former smoker 10/14/2011    Immunization History  Administered Date(s) Administered  Fluad Quad(high Dose 65+) 04/10/2019, 04/09/2020, 04/14/2021, 04/07/2022   Influenza Split 04/22/2009, 07/02/2010, 05/12/2011, 03/28/2012   Influenza, High Dose Seasonal PF 04/02/2013, 04/23/2014, 04/09/2015, 03/16/2016, 03/29/2017, 04/17/2018   PFIZER Comirnaty(Gray Top)Covid-19 Tri-Sucrose Vaccine 01/29/2021   PFIZER(Purple Top)SARS-COV-2 Vaccination 08/24/2019, 09/18/2019, 05/28/2020   Pneumococcal Conjugate-13 04/23/2014   Pneumococcal Polysaccharide-23 03/29/2005   Td 03/29/2005   Tdap 02/11/2012   Patient reports he is still having neuropathy and shortness breath from the chemo and this has been the same for the last 6 months or so. He knows the neuropathy is from the chemo. He is not sure that he wants to try any medications because he has heard about bad side effects from gabapentin. Patient reports he is also coughing more and it is a productive cough. He thought inhalers would only help with SOB and didn't think he wanted to consider those now.  Patient quit prednisone December of last year. He reports his joints are getting bad again and is considering going back on the prednisone.  Patient had stopped taking the levothyroxine as marked on his medication list.  Patient also doesn't have much energy and he doesn't sleep well. He usually wakes up at 2-3 in the morning and then goes back to sleep at 6. He sometimes doesn't sleep with oxygen at night depending on his oxygen level prior to bed. He does sleep a lot better when oxygen is higher. Recommended oxygen use every night.  Conditions to be addressed/monitored:  Hypertension, Hyperlipidemia, Diabetes, BPH, and Allergic Rhinitis  Conditions addressed this visit: Hypertension, diabetes  Care Plan : CCM Pharmacy Care Plan  Updates made by Viona Gilmore, [place] since 05/13/2022 12:00 AM     Problem: Problem: Hypertension, Hyperlipidemia, Diabetes, BPH, and Allergic Rhinitis      Long-Range Goal: Patient-Specific Goal   Start  Date: 05/14/2021  Expected End Date: 05/14/2022  Recent Progress: On track  Priority: High  Note:   Current Barriers:  Unable to achieve control of neuropathy and SOB  Unable to self administer medications as prescribed  Pharmacist Clinical Goal(s):  Patient will achieve adherence to monitoring guidelines and medication adherence to achieve therapeutic efficacy achieve control of cholesterol as evidenced by next lipid panel  through collaboration with PharmD and provider.   Interventions: 1:1 collaboration with [name], MD regarding development and update of comprehensive plan of care as evidenced by provider attestation and co-signature Inter-disciplinary care team collaboration (see longitudinal plan of care) Comprehensive medication review performed; medication list updated in electronic medical record  Hypertension (BP goal <140/90) -Controlled -Current treatment: Losartan 100 mg 1 tablet daily - in AM - Appropriate, Effective, Safe, Accessible Hydrochlorothiazide 12.5 mg 1 capsule daily - in AM - Appropriate, Effective, Safe, Accessible Metoprolol tartrate 50 mg 1 tablet twice daily - Appropriate, Effective, Safe, Accessible -Medications previously tried: none  -Current home readings: 130/67 HR 130/70, 121/61, 118/61, 130/66; has an arm cuff but does not check often at home -Current dietary habits: doesn't eat a lot of salt; does like some salt; frozen meal once a week; rarely canned vegetables but some canned soup -Current exercise habits: walking 20-30 minutes a day -Denies hypotensive/hypertensive symptoms -Educated on BP goals and benefits of medications for prevention of heart attack, stroke and kidney damage; Exercise goal of 150 minutes per week; Importance of home blood pressure monitoring; Proper BP monitoring technique; -Counseled to monitor BP at home weekly, document, and provide log at future appointments -Counseled on diet and exercise  extensively Recommended to  continue current medication  Hyperlipidemia/aortic atherosclerosis: (LDL goal < 70) -Uncontrolled -Current treatment: No medications -Medications previously tried: statins, Zetia (myalgias)  -Current dietary patterns: cooks with vegetable oil and some olive oil  -Current exercise habits: unable to -Educated on Cholesterol goals;  Benefits of statin for ASCVD risk reduction; Importance of limiting foods high in cholesterol; Exercise goal of 150 minutes per week; -Counseled on diet and exercise extensively Recommended repeat lipid panel. Consider PCSK9 therapy.  Diabetes (A1c goal <7%) -Controlled -Current medications: No medications -Medications previously tried: none  -Current home glucose readings fasting glucose: 105-115 post prandial glucose: < 100 -Denies hypoglycemic/hyperglycemic symptoms -Current meal patterns:  breakfast: n/a  lunch: n/a  dinner: n/a snacks: n/a drinks: n/a -Current exercise: unable to -Educated on A1c and blood sugar goals; Benefits of routine self-monitoring of blood sugar; -Counseled to check feet daily and get yearly eye exams -Counseled on diet and exercise extensively  BPH (Goal: minimize symptoms of enlarged prostate) -Controlled -Current treatment  Tamsulosin 0.4 mg 1 capsule daily after supper - Appropriate, Effective, Safe, Accessible -Medications previously tried: none  -Recommended to continue current medication  Allergic rhinitis (Goal: minimize symptoms) -Controlled -Current treatment  Loratadine 10 mg 1 tablet daily - Appropriate, Effective, Safe, Accessible Allegra 180 mg 1 tablet daily as needed (only uses when completely out of claritin) -Medications previously tried: none  -Counseled on alternating use of antihistamines and not using in the same day.  Health Maintenance -Vaccine gaps: COVID booster, tetanus -Current therapy:  Preservision 2 1 capsule twice daily Prochlorperazine 10 mg 1  tablet as needed Triamcinolone cream 0.1% as needed Vitamin C 1000 mcg 1 tablet daily Vitamin D 5000 units every other day -Educated on Cost vs benefit of each product must be carefully weighed by individual consumer Supplements may interfere with prescription drugs -Patient is satisfied with current therapy and denies issues -Recommended to continue current medication  Patient Goals/Self-Care Activities Patient will:  - take medications as prescribed as evidenced by patient report and record review check glucose weekly, document, and provide at future appointments check blood pressure weekly, document, and provide at future appointments  Follow Up Plan: Telephone follow up appointment with care management team member scheduled for: 6 months        Medication Assistance: None required.  Patient affirms current coverage meets needs.  Compliance/Adherence/Medication fill history: Care Gaps: COVID booster (he doesn't want to mess with it right now), eye exam, tetanus BP- 110/66 02/19/22  A1c: 6.3% (02/19/22)  Star-Rating Drugs: Losartan (Cozaar) 100 mg - Last filled 04/21/22 90 DS at [facility]    Patient's preferred pharmacy is:  [facility], [place] [address] Phone: [phone] Fax: [phone]  [id number] [place] [id number]: [phone]: [phone]  Uses pill box? No - has them all in one container  Pt endorses 99% compliance   We discussed: Current pharmacy is preferred with insurance plan and patient is satisfied with pharmacy services Patient decided to: Continue current medication management strategy  Care Plan and Follow Up Patient Decision:  Patient agrees to Care Plan and Follow-up.  Plan: Telephone follow up appointment with care  management team member scheduled for:  6 months  MJeni Salles PharmD, [phone]

## 2022-05-13 ENCOUNTER — Ambulatory Visit (INDEPENDENT_AMBULATORY_CARE_PROVIDER_SITE_OTHER): Payer: Medicare Other | Admitting: Pharmacist

## 2022-05-13 DIAGNOSIS — E119 Type 2 diabetes mellitus without complications: Secondary | ICD-10-CM

## 2022-05-13 DIAGNOSIS — I10 Essential (primary) hypertension: Secondary | ICD-10-CM

## 2022-05-13 NOTE — Patient Instructions (Signed)
Hi Kyce,  It was great to catch up again!   Please reach out to me if you have any questions or need anything before our follow up!  Best, Maddie  [name], PharmD, [facility] [phone]

## 2022-05-14 ENCOUNTER — Other Ambulatory Visit: Payer: Self-pay | Admitting: Physician Assistant

## 2022-05-17 ENCOUNTER — Other Ambulatory Visit: Payer: Self-pay | Admitting: Family Medicine

## 2022-05-17 DIAGNOSIS — M1990 Unspecified osteoarthritis, unspecified site: Secondary | ICD-10-CM

## 2022-05-18 ENCOUNTER — Other Ambulatory Visit: Payer: Medicare Other

## 2022-05-18 DIAGNOSIS — E039 Hypothyroidism, unspecified: Secondary | ICD-10-CM

## 2022-05-18 MED ORDER — PREDNISONE 5 MG PO TABS: 5.0000 mg | ORAL_TABLET | Freq: Every day | ORAL | 0 refills | Status: DC

## 2022-05-19 LAB — TSH: TSH: 5.15 u[IU]/mL — ABNORMAL HIGH (ref 0.450–4.500)

## 2022-05-24 IMAGING — CT CT ABD-PELV W/ CM
2 of 4 series · 12 of 36 positions shown, 15 images · IV contrast (OMNIPAQUE)
Comparison: 12/05/2020 PET-CT.  Most recent CT chest 11/17/2020.

CLINICAL DATA: Primary Cancer Type: Lung
TECHNIQUE: Multidetector CT imaging of the chest, abdomen and pelvis was
performed following the standard protocol during bolus
administration of intravenous contrast.

CONTRAST:  80mL OMNIPAQUE IOHEXOL 350 MG/ML SOLN

[Series 2: cap with · axial · 0.95mm/px · z∈[-476,+84]mm · 9 of 136 slices shown, 12 images]
[im 12/136  mediastinal]
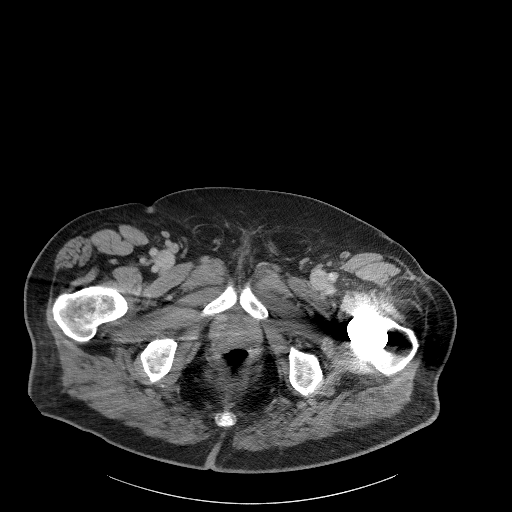
[im 12/136  lung]
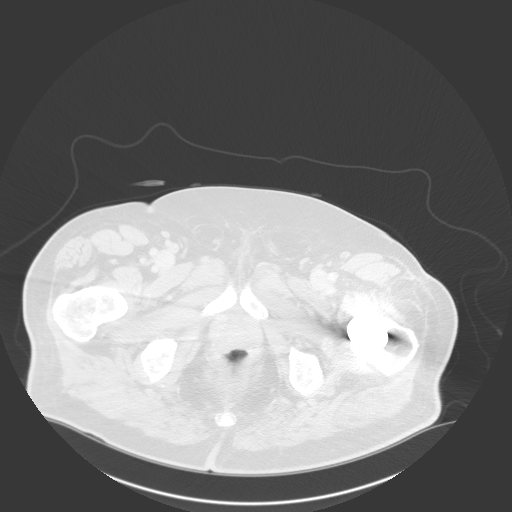
[im 23/136  lung]
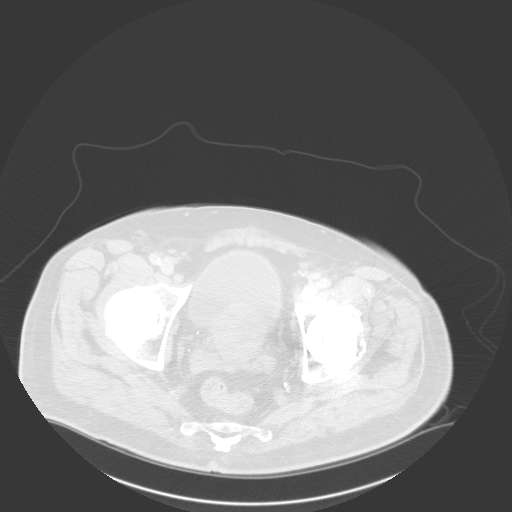
[im 46/136  lung]
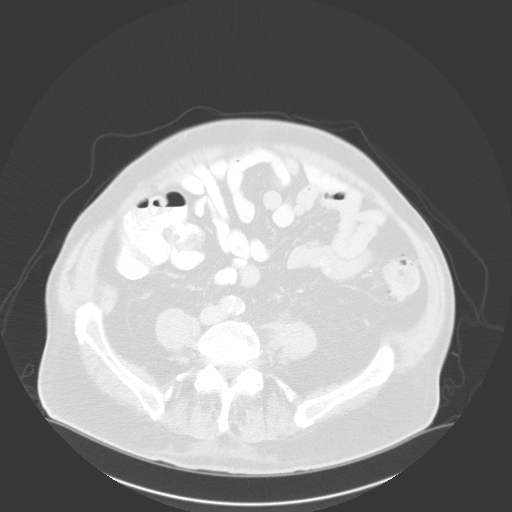
[im 57/136  lung]
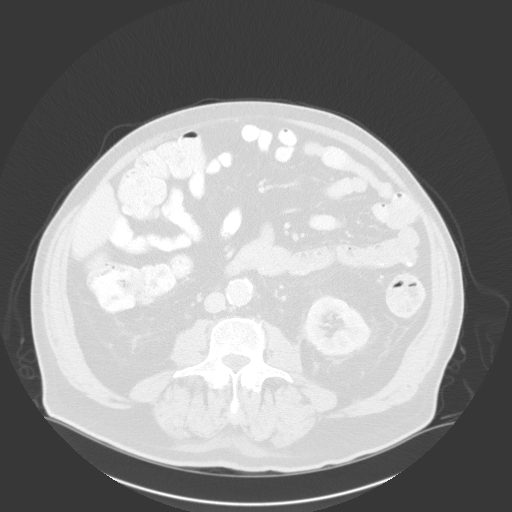
[im 68/136  mediastinal]
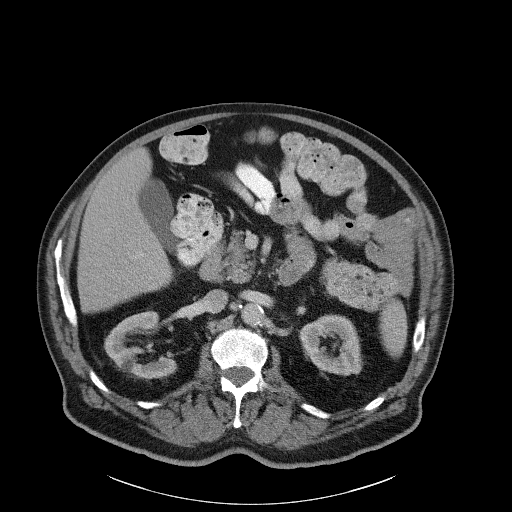
[im 68/136  lung]
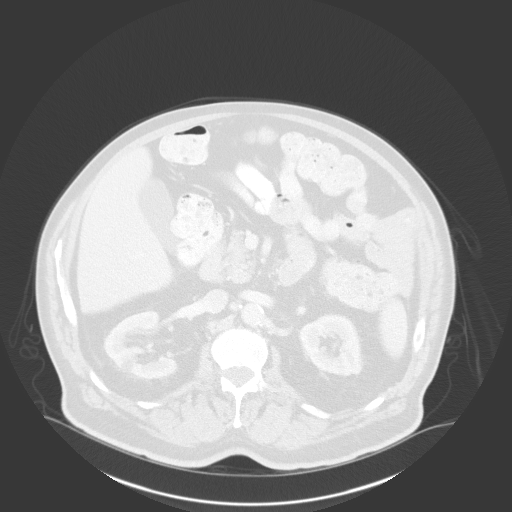
[im 79/136  lung]
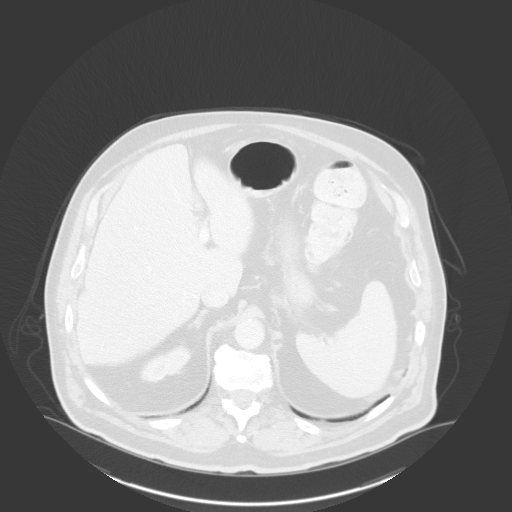
[im 91/136  lung]
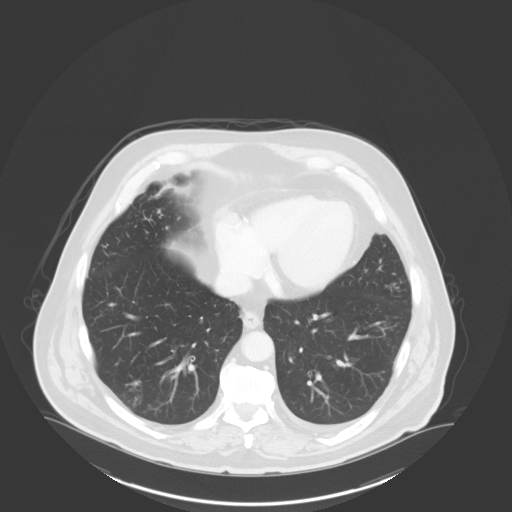
[im 113/136  lung]
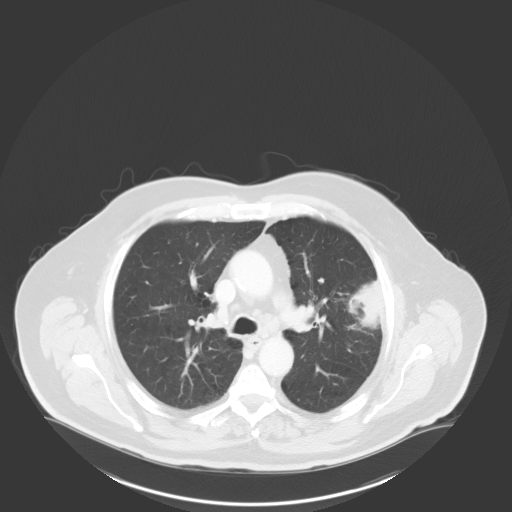
[im 124/136  mediastinal]
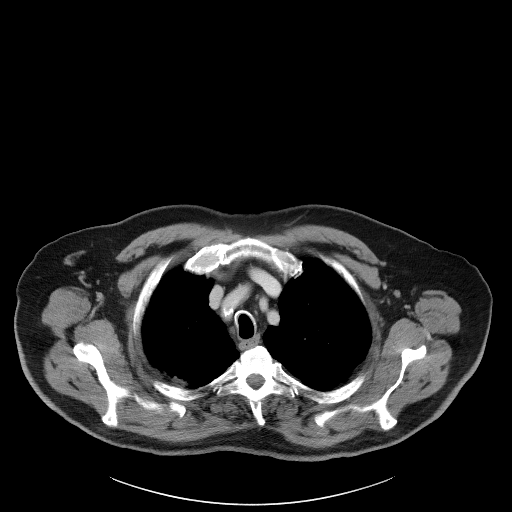
[im 124/136  lung]
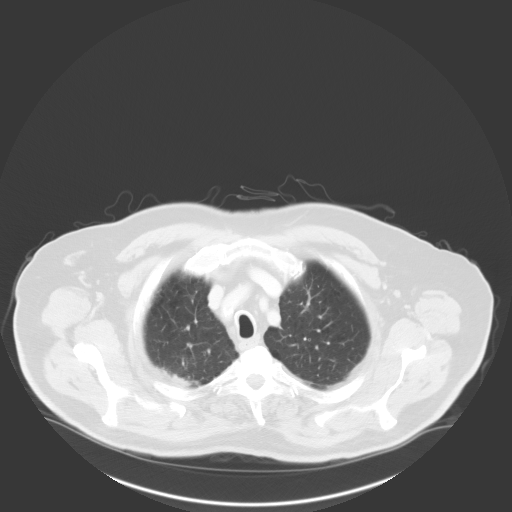

[Series 4: coronals · coronal · 0.94mm/px · 3 of 172 slices shown]
[im 35/172  lung]
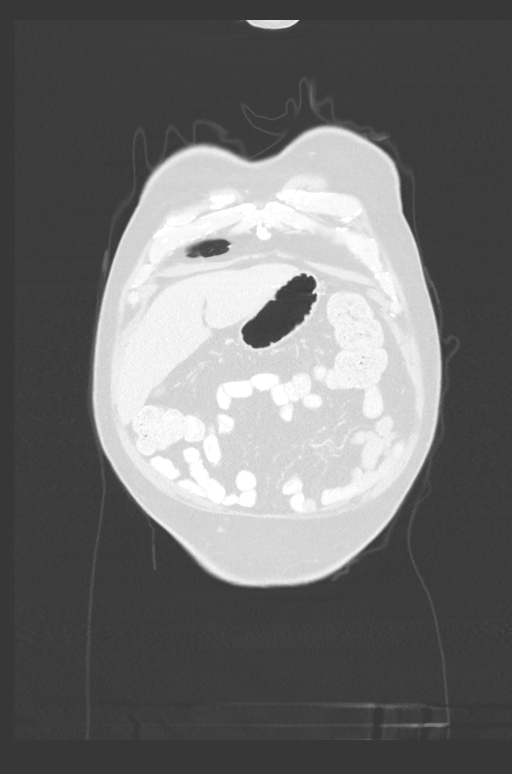
[im 69/172  lung]
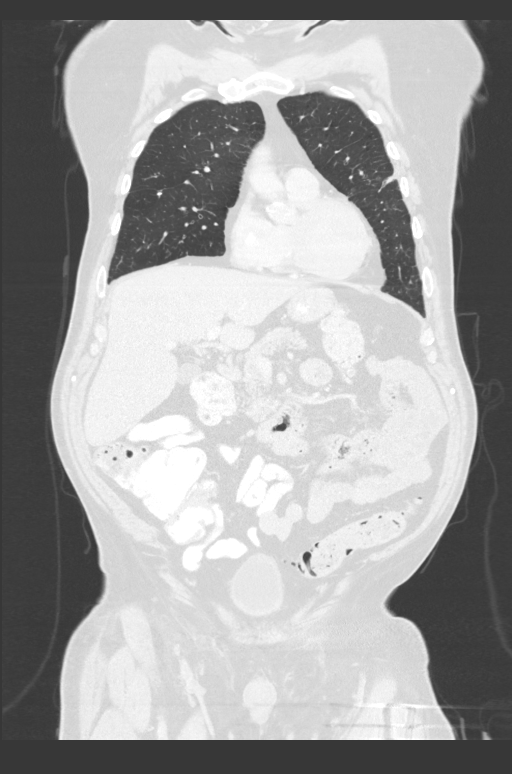
[im 103/172  lung]
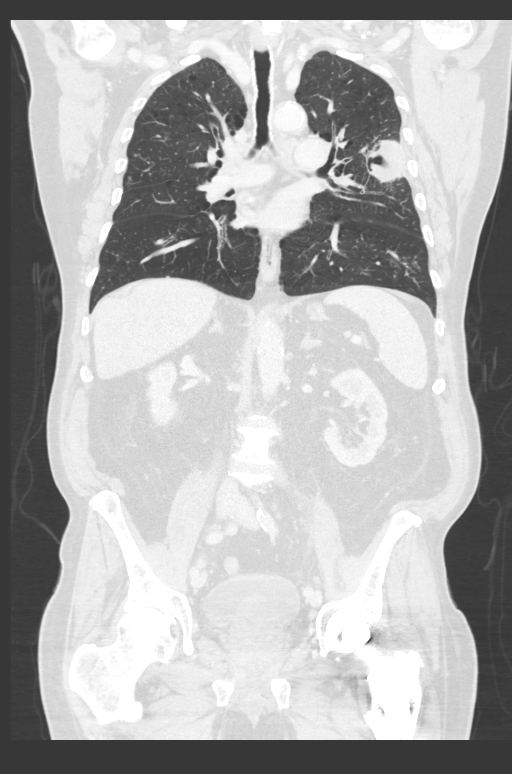

[12 of 36 positions shown; findings below may reference images not displayed]

Imaging Indication: Assess response to therapy

Interval therapy since last imaging? Yes

Initial Cancer Diagnosis

Date: 11/18/2020; Established by: Biopsy-proven

Detailed Pathology: Stage IV non-small cell lung cancer, squamous
cell carcinoma.

Primary Tumor location: Left upper lobe.  Left adrenal gland lesion.

Surgeries: No thoracic.  Hip arthroplasty.

Chemotherapy: Yes; Ongoing? Yes; Most recent administration:
02/02/2021

Immunotherapy?  Yes; Type: Keytruda; Ongoing? Yes

Radiation therapy? No

Other Cancers: Primary left parotid neoplasm versus nodal metastatic
disease.

EXAM:
CT CHEST, ABDOMEN, AND PELVIS WITH CONTRAST
FINDINGS: CT CHEST FINDINGS

Cardiovascular: Heart size appears normal. No pericardial aortic
atherosclerosis and coronary artery calcifications.

Mediastinum/Nodes: Normal appearance of the thyroid gland. The
trachea appears patent and is midline. Normal appearance of the
esophagus.

Left paratracheal lymph node measures 1 cm, image [DATE]. Formally
cm.

Subcarinal lymph node measures 0.8 cm, image [DATE].  Formally 1.3 cm.

Left hilar lymph node measures 1.1 cm, image 33/2.  Formally 1.2 cm.

Lungs/Pleura:

The left upper lobe lung mass measures 5.7 x 4.5 cm, image 59/6.
Previously 8.1 x 5.8 cm. Increase central cavitation compatible with
tumor necrosis.

Anteromedial subpleural mass within the left upper lobe measures
x 0.7 cm, image 73/6. Previously 2.6 x 1.9 cm.

Previous 6 mm nodule in the superior segment of left lower lobe now
measures 4 mm, image 76/6.

Previous 7 mm lung nodule within the inferior lingula has resolved
in the interval.

Several new lung nodules are identified, these include:

-Within the superior segment of left lower lobe there is a new small
nodule measuring 1.2 x 0.9 cm, image 73/6.

-Within the posterior right upper lobe there is a new subpleural
nodule which measures 1.3 x 1.2 cm, image [DATE].

-Within the posterior lung base there is a new subpleural nodule
measuring 1.2 x 0.6 cm, image 118/6.

-Within the central aspect of the posteromedial right upper lobe
there is a new conglomeration of nodules versus part solid nodule
measuring 1.9 by 1.7 cm, image 55/6.

Multiple scattered clustered areas of tree-in-bud nodularity are
again seen throughout both lungs. These are favored to represent
inflammatory or infectious process.

Musculoskeletal: No chest wall mass or suspicious bone lesions
identified.

CT ABDOMEN PELVIS FINDINGS

Hepatobiliary: No focal liver abnormality is seen. No gallstones,
gallbladder wall thickening, or biliary dilatation.

Pancreas: Unremarkable. No pancreatic ductal dilatation or
surrounding inflammatory changes.

Spleen: Normal in size without focal abnormality.

Adrenals/Urinary Tract: Normal right adrenal gland. Stable left
adrenal nodule measuring 1.6 cm, image 65/2. Small bilateral kidney
cysts. Small stones are again identified clustered within the
posterior right bladder measuring up to 5 mm.

Stomach/Bowel: Stomach is within normal limits. Appendix appears
normal. No evidence of bowel wall thickening, distention, or
inflammatory changes. Distal colonic diverticula identified without
signs of acute inflammation.

Vascular/Lymphatic: Aortic atherosclerosis. No enlarged lymph nodes
within the abdomen or pelvis.

Reproductive: Prostate gland is enlarged with mass effect upon the
bladder base.

Other: No ascites or focal fluid collections. Small fat containing
umbilical hernia.

Musculoskeletal: Left hip arthroplasty. No acute or suspicious bone
lesions.
IMPRESSION: 1. The dominant left upper lobe lung mass has decreased in size in
the interval. There has also been interval decrease in size of
mediastinal and left hilar lymph nodes. The subpleural nodule within
the anteromedial left upper lobe has also decreased in the interval.
2. Small scattered lung nodules are again seen throughout both
lungs. Several of the previously noted nodules have decreased in
size or resolved in the interval. Several new lung nodules however
are noted within the right upper lobe, right lung base and superior
segment of left lower lobe. Although the appearance of these nodules
are nonspecific and may reflect inflammatory or infectious process
metastatic disease can not be excluded.
3. Similar appearance of clustered areas of tree-in-bud nodularity
throughout both lungs which are nonspecific but favored to represent
inflammatory or infectious process.
4. Stable left adrenal nodule.
5. Aortic atherosclerosis and coronary artery calcifications.

Aortic Atherosclerosis ([id number].J).

## 2022-05-25 ENCOUNTER — Ambulatory Visit (INDEPENDENT_AMBULATORY_CARE_PROVIDER_SITE_OTHER): Payer: Medicare Other | Admitting: Family Medicine

## 2022-05-25 ENCOUNTER — Encounter: Payer: Self-pay | Admitting: Family Medicine

## 2022-05-25 VITALS — BP 140/70 | HR 84 | Ht 73.0 in | Wt 256.0 lb

## 2022-05-25 DIAGNOSIS — E039 Hypothyroidism, unspecified: Secondary | ICD-10-CM

## 2022-05-25 DIAGNOSIS — J9601 Acute respiratory failure with hypoxia: Secondary | ICD-10-CM | POA: Diagnosis not present

## 2022-05-25 DIAGNOSIS — C3492 Malignant neoplasm of unspecified part of left bronchus or lung: Secondary | ICD-10-CM

## 2022-05-25 DIAGNOSIS — M1990 Unspecified osteoarthritis, unspecified site: Secondary | ICD-10-CM

## 2022-05-25 MED ORDER — LEVOTHYROXINE SODIUM 50 MCG PO TABS: 50.0000 ug | ORAL_TABLET | Freq: Every day | ORAL | 2 refills | Status: DC

## 2022-05-25 MED ORDER — PREDNISONE 5 MG PO TABS: 5.0000 mg | ORAL_TABLET | Freq: Every day | ORAL | 1 refills | Status: DC

## 2022-05-25 NOTE — Progress Notes (Signed)
Chronic other couple more things the  Subjective:    Patient ID: Alex Edwards, male    DOB: June 24, 1936, 86 y.o.   MRN: [id number]  HPI He is here for follow-up.  He has had recent blood work which did show a slightly elevated TSH.  He was placed on levothyroxine in the past mainly because of chemotherapy related problems.  He does complain of continued difficulty with shortness of breath as well as fatigue.  He states that the prednisone is working quite nicely for his bilateral shoulder pain.  His sedimentation rate was normal however he has responded very nicely to the prednisone 5 mg.  Complains of difficulty with sleep which has been going on for several months.  He has a follow-up appointment with oncology in the near future and another chest CT scan has apparently been ordered.  He is not interested in any more chemotherapy.   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  He is also on very low-dose of prednisone which seems to be helping with his shoulder pain in spite of the normal sedimentation rate.  He wants to continue that medication.  Recommend he use melatonin to help with his sleep.  All of his questions were answered.  Greater than 30 minutes was spent discussing these issues with him.

## 2022-05-27 DIAGNOSIS — E119 Type 2 diabetes mellitus without complications: Secondary | ICD-10-CM

## 2022-05-27 DIAGNOSIS — I10 Essential (primary) hypertension: Secondary | ICD-10-CM

## 2022-06-18 ENCOUNTER — Other Ambulatory Visit: Payer: Self-pay | Admitting: Family Medicine

## 2022-06-18 DIAGNOSIS — E1159 Type 2 diabetes mellitus with other circulatory complications: Secondary | ICD-10-CM

## 2022-06-24 DIAGNOSIS — C3492 Malignant neoplasm of unspecified part of left bronchus or lung: Secondary | ICD-10-CM | POA: Diagnosis not present

## 2022-06-24 DIAGNOSIS — J9601 Acute respiratory failure with hypoxia: Secondary | ICD-10-CM | POA: Diagnosis not present

## 2022-07-01 ENCOUNTER — Other Ambulatory Visit: Payer: Self-pay | Admitting: Physician Assistant

## 2022-07-01 ENCOUNTER — Inpatient Hospital Stay: Payer: Medicare Other | Attending: Internal Medicine

## 2022-07-01 ENCOUNTER — Ambulatory Visit (HOSPITAL_COMMUNITY)
Admission: RE | Admit: 2022-07-01 | Discharge: 2022-07-01 | Disposition: A | Discharge status: Home / Self Care | Payer: Medicare Other | Source: Ambulatory Visit | Attending: Internal Medicine | Admitting: Internal Medicine

## 2022-07-01 DIAGNOSIS — C3490 Malignant neoplasm of unspecified part of unspecified bronchus or lung: Secondary | ICD-10-CM | POA: Diagnosis not present

## 2022-07-01 DIAGNOSIS — K5730 Diverticulosis of large intestine without perforation or abscess without bleeding: Secondary | ICD-10-CM | POA: Insufficient documentation

## 2022-07-01 DIAGNOSIS — E1142 Type 2 diabetes mellitus with diabetic polyneuropathy: Secondary | ICD-10-CM | POA: Insufficient documentation

## 2022-07-01 DIAGNOSIS — G629 Polyneuropathy, unspecified: Secondary | ICD-10-CM | POA: Insufficient documentation

## 2022-07-01 DIAGNOSIS — E039 Hypothyroidism, unspecified: Secondary | ICD-10-CM

## 2022-07-01 DIAGNOSIS — K760 Fatty (change of) liver, not elsewhere classified: Secondary | ICD-10-CM | POA: Insufficient documentation

## 2022-07-01 DIAGNOSIS — C3412 Malignant neoplasm of upper lobe, left bronchus or lung: Secondary | ICD-10-CM | POA: Insufficient documentation

## 2022-07-01 DIAGNOSIS — Z9221 Personal history of antineoplastic chemotherapy: Secondary | ICD-10-CM | POA: Insufficient documentation

## 2022-07-01 DIAGNOSIS — Z79890 Hormone replacement therapy (postmenopausal): Secondary | ICD-10-CM | POA: Insufficient documentation

## 2022-07-01 DIAGNOSIS — Z79899 Other long term (current) drug therapy: Secondary | ICD-10-CM | POA: Insufficient documentation

## 2022-07-01 DIAGNOSIS — C7972 Secondary malignant neoplasm of left adrenal gland: Secondary | ICD-10-CM | POA: Insufficient documentation

## 2022-07-01 DIAGNOSIS — I7143 Infrarenal abdominal aortic aneurysm, without rupture: Secondary | ICD-10-CM | POA: Insufficient documentation

## 2022-07-01 DIAGNOSIS — E785 Hyperlipidemia, unspecified: Secondary | ICD-10-CM | POA: Insufficient documentation

## 2022-07-01 DIAGNOSIS — Z85828 Personal history of other malignant neoplasm of skin: Secondary | ICD-10-CM | POA: Insufficient documentation

## 2022-07-01 DIAGNOSIS — J439 Emphysema, unspecified: Secondary | ICD-10-CM | POA: Diagnosis not present

## 2022-07-01 DIAGNOSIS — Z87891 Personal history of nicotine dependence: Secondary | ICD-10-CM | POA: Insufficient documentation

## 2022-07-01 DIAGNOSIS — I119 Hypertensive heart disease without heart failure: Secondary | ICD-10-CM | POA: Insufficient documentation

## 2022-07-01 DIAGNOSIS — N281 Cyst of kidney, acquired: Secondary | ICD-10-CM | POA: Diagnosis not present

## 2022-07-01 DIAGNOSIS — I2510 Atherosclerotic heart disease of native coronary artery without angina pectoris: Secondary | ICD-10-CM | POA: Insufficient documentation

## 2022-07-01 LAB — CBC WITH DIFFERENTIAL (CANCER CENTER ONLY)
Abs Immature Granulocytes: 0.06 10*3/uL (ref 0.00–0.07)
Basophils Absolute: 0 10*3/uL (ref 0.0–0.1)
Basophils Relative: 1 %
Eosinophils Absolute: 0.1 10*3/uL (ref 0.0–0.5)
Eosinophils Relative: 1 %
HCT: 37.2 % — ABNORMAL LOW (ref 39.0–52.0)
Hemoglobin: 12.7 g/dL — ABNORMAL LOW (ref 13.0–17.0)
Immature Granulocytes: 1 %
Lymphocytes Relative: 15 %
Lymphs Abs: 1.2 10*3/uL (ref 0.7–4.0)
MCH: 32.2 pg (ref 26.0–34.0)
MCHC: 34.1 g/dL (ref 30.0–36.0)
MCV: 94.4 fL (ref 80.0–100.0)
Monocytes Absolute: 0.7 10*3/uL (ref 0.1–1.0)
Monocytes Relative: 9 %
Neutro Abs: 5.7 10*3/uL (ref 1.7–7.7)
Neutrophils Relative %: 73 %
Platelet Count: 219 10*3/uL (ref 150–400)
RBC: 3.94 MIL/uL — ABNORMAL LOW (ref 4.22–5.81)
RDW: 12.4 % (ref 11.5–15.5)
WBC Count: 7.8 10*3/uL (ref 4.0–10.5)
nRBC: 0 % (ref 0.0–0.2)

## 2022-07-01 LAB — CMP (CANCER CENTER ONLY)
ALT: 11 U/L (ref 0–44)
AST: 13 U/L — ABNORMAL LOW (ref 15–41)
Albumin: 4.3 g/dL (ref 3.5–5.0)
Alkaline Phosphatase: 63 U/L (ref 38–126)
Anion gap: 7 (ref 5–15)
BUN: 20 mg/dL (ref 8–23)
CO2: 30 mmol/L (ref 22–32)
Calcium: 9.5 mg/dL (ref 8.9–10.3)
Chloride: 101 mmol/L (ref 98–111)
Creatinine: 1.16 mg/dL (ref 0.61–1.24)
GFR, Estimated: >60 mL/min (ref >60)
Glucose, Bld: 114 mg/dL — ABNORMAL HIGH (ref 70–99)
Potassium: 4.4 mmol/L (ref 3.5–5.1)
Sodium: 138 mmol/L (ref 135–145)
Total Bilirubin: 0.5 mg/dL (ref 0.3–1.2)
Total Protein: 6.7 g/dL (ref 6.5–8.1)

## 2022-07-01 LAB — TSH: TSH: 3.049 u[IU]/mL (ref 0.350–4.500)

## 2022-07-01 MED ORDER — IOHEXOL 300 MG/ML  SOLN
100.0000 mL | Freq: Once | INTRAMUSCULAR | Status: AC | PRN
  Administered 2022-07-01: 100 mL via INTRAVENOUS

## 2022-07-01 NOTE — Progress Notes (Signed)
The patient requested TSH to be drawn today at his PCP's request. I have placed the order.

## 2022-07-05 ENCOUNTER — Inpatient Hospital Stay: Payer: Medicare Other | Admitting: Internal Medicine

## 2022-07-05 ENCOUNTER — Encounter: Payer: Self-pay | Admitting: Internal Medicine

## 2022-07-05 VITALS — BP 159/71 | HR 78 | Temp 98.0°F | Resp 18 | Wt 260.7 lb

## 2022-07-05 DIAGNOSIS — Z79899 Other long term (current) drug therapy: Secondary | ICD-10-CM | POA: Diagnosis not present

## 2022-07-05 DIAGNOSIS — E785 Hyperlipidemia, unspecified: Secondary | ICD-10-CM | POA: Diagnosis not present

## 2022-07-05 DIAGNOSIS — Z87891 Personal history of nicotine dependence: Secondary | ICD-10-CM | POA: Diagnosis not present

## 2022-07-05 DIAGNOSIS — K5730 Diverticulosis of large intestine without perforation or abscess without bleeding: Secondary | ICD-10-CM | POA: Diagnosis not present

## 2022-07-05 DIAGNOSIS — C3490 Malignant neoplasm of unspecified part of unspecified bronchus or lung: Secondary | ICD-10-CM | POA: Diagnosis not present

## 2022-07-05 DIAGNOSIS — K760 Fatty (change of) liver, not elsewhere classified: Secondary | ICD-10-CM | POA: Diagnosis not present

## 2022-07-05 DIAGNOSIS — I7143 Infrarenal abdominal aortic aneurysm, without rupture: Secondary | ICD-10-CM | POA: Diagnosis not present

## 2022-07-05 DIAGNOSIS — C3412 Malignant neoplasm of upper lobe, left bronchus or lung: Secondary | ICD-10-CM | POA: Diagnosis not present

## 2022-07-05 DIAGNOSIS — I119 Hypertensive heart disease without heart failure: Secondary | ICD-10-CM | POA: Diagnosis not present

## 2022-07-05 DIAGNOSIS — Z85828 Personal history of other malignant neoplasm of skin: Secondary | ICD-10-CM | POA: Diagnosis not present

## 2022-07-05 DIAGNOSIS — I2510 Atherosclerotic heart disease of native coronary artery without angina pectoris: Secondary | ICD-10-CM | POA: Diagnosis not present

## 2022-07-05 DIAGNOSIS — Z79890 Hormone replacement therapy (postmenopausal): Secondary | ICD-10-CM | POA: Diagnosis not present

## 2022-07-05 DIAGNOSIS — G629 Polyneuropathy, unspecified: Secondary | ICD-10-CM | POA: Diagnosis not present

## 2022-07-05 DIAGNOSIS — Z9221 Personal history of antineoplastic chemotherapy: Secondary | ICD-10-CM | POA: Diagnosis not present

## 2022-07-05 DIAGNOSIS — C7972 Secondary malignant neoplasm of left adrenal gland: Secondary | ICD-10-CM | POA: Diagnosis not present

## 2022-07-05 DIAGNOSIS — E1142 Type 2 diabetes mellitus with diabetic polyneuropathy: Secondary | ICD-10-CM | POA: Diagnosis not present

## 2022-07-05 NOTE — Progress Notes (Signed)
[place] [phone]   [phone]  OFFICE PROGRESS NOTE  [name], [place] [address]  DIAGNOSIS: Stage IV (T3, N2, M1c) non-small cell lung cancer, squamous cell carcinoma presented with large left upper lobe lung mass in addition to left hilar and mediastinal lymphadenopathy as well as bilateral pulmonary nodules and left adrenal gland lesion diagnosed in May 2022.  PRIOR THERAPY: Palliative systemic chemotherapy with carboplatin for an AUC of 5, paclitaxel 175 mg/m2 and Keytruda 200 mg IV every 3 weeks.  First dose expected on 12/22/2020. Status post 9 cycles.  Starting from cycle #5 the patient will be on maintenance treatment with single agent Keytruda 200 Mg IV every 3 weeks.  This treatment is currently on hold secondary to intolerance with significant itching and neuropathy.  CURRENT THERAPY: Observation   INTERVAL HISTORY: ROLIN SCHULT Sr 87 y.o. male returns to the clinic today for 6 months follow-up visit accompanied by his wife.  The patient is feeling fine today with no concerning complaints except for 20 pounds of weight gain.  He is currently on prednisone 5 mg p.o. daily by his primary care physician for polymyalgia rheumatica.  He denied having any current chest pain but has shortness of breath with exertion with no cough or hemoptysis.  He continues to have peripheral neuropathy especially in the legs.  He denied having any fever or chills.  He has no nausea, vomiting, diarrhea or constipation.  He has no headache or visual changes.  He is here today for evaluation with repeat CT scan of the chest, abdomen and pelvis for restaging of his disease.  MEDICAL HISTORY: Past Medical History:  Diagnosis Date   BCE (basal cell epithelioma)    Bruises easily    on hands   Cough    smokers cough or perfurmes   Degeneration macular    right eye   Diabetes mellitus without complication (HCC)     Dyslipidemia    ED (erectile dysfunction)    Hx of adenomatous colonic polyps    Hyperlipidemia    Hypertension    borderline   Obesity    Psoriasis    Seasonal allergies    Smoker    former    ALLERGIES:  is allergic to paclitaxel, vancomycin, amlodipine, and latex.  MEDICATIONS:  Current Outpatient Medications  Medication Sig Dispense Refill   acetaminophen (TYLENOL) 500 MG tablet Take 500 mg by mouth every 6 (six) hours as needed. (Patient not taking: Reported on 05/25/2022)     Ascorbic Acid (VITAMIN C) 1000 MG tablet Take 1,000 mg by mouth daily.     Cholecalciferol (VITAMIN D-3) 125 MCG (5000 UT) TABS Take 1 tablet by mouth every other day. (Patient not taking: Reported on 05/25/2022)     hydrochlorothiazide (MICROZIDE) 12.5 MG capsule TAKE ONE CAPSULE BY MOUTH DAILY 90 capsule 1   ketoconazole (NIZORAL) 2 % cream Apply 1 application topically daily. (Patient not taking: Reported on 05/25/2022) 30 g 1   levothyroxine (SYNTHROID) 50 MCG tablet Take 1 tablet (50 mcg total) by mouth daily before breakfast. 30 tablet 2   loratadine (CLARITIN) 10 MG tablet Take 10 mg by mouth daily.     losartan (COZAAR) 100 MG tablet TAKE ONE TABLET BY MOUTH DAILY 90 tablet 1   metoprolol tartrate (LOPRESSOR) 50 MG tablet Take 1 tablet (50 mg total) by mouth 2 (two) times daily. 180 tablet 1   Multiple Vitamins-Minerals (PRESERVISION  AREDS 2) CAPS Take 1 capsule by mouth in the morning and at bedtime.     [place] Patient is to test one time a day DX: E11.9 100 each 4   ONETOUCH VERIO test strip USE TO TEST BLOOD SUGAR ONCE DAILY. 50 strip 3   predniSONE (DELTASONE) 5 MG tablet Take 1 tablet (5 mg total) by mouth daily with breakfast. 90 tablet 1   tamsulosin (FLOMAX) 0.4 MG CAPS capsule Take 1 capsule (0.4 mg total) by mouth daily after supper. 90 capsule 1   triamcinolone cream (KENALOG) 0.1 % APPLY TO AFFECTED AREA TWICE A DAY. (Patient not taking: Reported on 05/25/2022)  454 g 0   No current facility-administered medications for this visit.    SURGICAL HISTORY:  Past Surgical History:  Procedure Laterality Date   BRONCHIAL BRUSHINGS  11/18/2020   Procedure: BRONCHIAL BRUSHINGS;  Surgeon: Spero Geralds, MD;  Location: [facility];  Service: Pulmonary;;   BRONCHIAL NEEDLE ASPIRATION BIOPSY  11/18/2020   Procedure: BRONCHIAL NEEDLE ASPIRATION BIOPSIES;  Surgeon: Spero Geralds, MD;  Location: [facility];  Service: Pulmonary;;   BRONCHIAL WASHINGS  11/18/2020   Procedure: BRONCHIAL WASHINGS;  Surgeon: Spero Geralds, MD;  Location: [facility];  Service: Pulmonary;;   CATARACT EXTRACTION, BILATERAL Bilateral 2013   COLONOSCOPY  2005   [facility]   ENDOBRONCHIAL ULTRASOUND N/A 11/18/2020   Procedure: ENDOBRONCHIAL ULTRASOUND;  Surgeon: Spero Geralds, MD;  Location: [facility];  Service: Pulmonary;  Laterality: N/A;   POLYPECTOMY     SKIN CANCER EXCISION Right 1993   under eye-MOHS, freeze multiple places freq   TONSILLECTOMY  as child   TOTAL HIP ARTHROPLASTY Left 01/04/2014   Procedure: LEFT TOTAL HIP ARTHROPLASTY ANTERIOR APPROACH;  Surgeon: Mcarthur Rossetti, MD;  Location: [facility];  Service: Orthopedics;  Laterality: Left;   TOTAL HIP ARTHROPLASTY Left 02/14/2014   Procedure: Irrigation and Debridement left hip;  Surgeon: Mcarthur Rossetti, MD;  Location: [facility];  Service: Orthopedics;  Laterality: Left;    REVIEW OF SYSTEMS:  A comprehensive review of systems was negative except for: Constitutional: positive for fatigue Respiratory: positive for dyspnea on exertion Neurological: positive for paresthesia   PHYSICAL EXAMINATION: General appearance: alert, cooperative, fatigued, and no distress Head: Normocephalic, without obvious abnormality, atraumatic Neck: no adenopathy, no JVD, supple, symmetrical, trachea midline, and thyroid not enlarged, symmetric, no tenderness/mass/nodules Lymph nodes: Cervical, supraclavicular, and axillary  nodes normal. Resp: clear to auscultation bilaterally Back: symmetric, no curvature. ROM normal. No CVA tenderness. Cardio: regular rate and rhythm, S1, S2 normal, no murmur, click, rub or gallop GI: soft, non-tender; bowel sounds normal; no masses,  no organomegaly Extremities: extremities normal, atraumatic, no cyanosis or edema  ECOG PERFORMANCE STATUS: 1 - Symptomatic but completely ambulatory  Blood pressure (!) 159/71, pulse 78, temperature 98 F (36.7 C), resp. rate 18, weight 260 lb 11.2 oz (118.3 kg), SpO2 97 %.  LABORATORY DATA: Lab Results  Component Value Date   WBC 7.8 07/01/2022   HGB 12.7 (L) 07/01/2022   HCT 37.2 (L) 07/01/2022   MCV 94.4 07/01/2022   PLT 219 07/01/2022      Chemistry      Component Value Date/Time   NA 138 07/01/2022 1114   NA 141 01/29/2020 1157   K 4.4 07/01/2022 1114   CL 101 07/01/2022 1114   CO2 30 07/01/2022 1114   BUN 20 07/01/2022 1114   BUN 12 01/29/2020 1157   CREATININE 1.16 07/01/2022  1114   CREATININE 1.11 07/26/2016 0921      Component Value Date/Time   CALCIUM 9.5 07/01/2022 1114   ALKPHOS 63 07/01/2022 1114   AST 13 (L) 07/01/2022 1114   ALT 11 07/01/2022 1114   BILITOT 0.5 07/01/2022 1114       RADIOGRAPHIC STUDIES: CT Chest W Contrast  Result Date: 07/02/2022 CLINICAL DATA:  Non-small-cell lung cancer * Tracking Code: BO * EXAM: CT CHEST, ABDOMEN, AND PELVIS WITH CONTRAST TECHNIQUE: Multidetector CT imaging of the chest, abdomen and pelvis was performed following the standard protocol during bolus administration of intravenous contrast. RADIATION DOSE REDUCTION: This exam was performed according to the departmental dose-optimization program which includes automated exposure control, adjustment of the mA and/or kV according to patient size and/or use of iterative reconstruction technique. CONTRAST:  132mL OMNIPAQUE IOHEXOL 300 MG/ML  SOLN COMPARISON:  None Available. 12/28/2021 and older FINDINGS: CT CHEST FINDINGS  Cardiovascular: Scattered vascular calcifications seen along the normal caliber thoracic aorta. Coronary artery calcifications are seen. No pericardial effusion. The heart is enlarged. There is some calcifications along the mitral valve annulus. There is also some calcifications along the aortic valve. Mediastinum/Nodes: No specific abnormal lymph node enlargement identified in the axillary region. There are some small nodes along both hila, less than 1 cm in size not pathologic by size criteria. In addition these are unchanged from previous. Similar small nodes along the mediastinum. One larger node is seen to the left of the proximal aspect of the left main bronchus on axial image 25 of series 504 measures 15 x 12 mm and previously in retrospect would have measured 16 by 13 mm, unchanged. Lungs/Pleura: Focal mass lesion in the lingula adjacent to the pleura with pleural retraction is again seen which previously measured 2.3 x 2.1 cm and today on image 28 of series 504 measures 1.8 by 1.6 cm. Adjacent spiculation and some patchy opacity. No separate additional lung nodules. There are scattered reticulonodular changes seen in the lungs however including greatest of the right lower lobe and similar to previous. There are some scattered areas of calcified lung nodules as well as centrilobular emphysematous changes. Apical pleural thickening. No pneumothorax or effusion. Musculoskeletal: Slight curvature of the spine with scattered degenerative changes. CT ABDOMEN PELVIS FINDINGS Hepatobiliary: Fatty liver infiltration. No enhancing liver lesion. Gallbladder is present. Pancreas: Mild pancreatic atrophy.  No discrete mass lesion. Spleen: Spleen is nonenlarged. Adrenals/Urinary Tract: Slight thickening of the inferior margin of the right adrenal gland. There is a focal left adrenal nodule once again identified measuring diameter of 17 by 15 mm. Stable. Mild bilateral renal atrophy. Nonspecific perinephric stranding.  Benign-appearing bilateral Bosniak 1 renal cysts are stable. No specific follow-up. No collecting system dilatation. Slight wall thickening of the urinary bladder with an enlarged prostate mass effect along the base of bladder. Dependent bladder stones are again seen. Stomach/Bowel: On this non oral contrast exam, the large bowel has a normal course and caliber with scattered stool. Colonic diverticulosis on the left side. Normal appendix. The stomach and small bowel are nondilated. No free air or free fluid. Vascular/Lymphatic: Diffuse vascular calcifications are identified along the aorta and branch vessels. Slight focal ectasia of the infrarenal abdominal aorta with diameter approaching 3.0 by 2.8 cm. Similar to previous. Normal caliber IVC. No specific abnormal lymph node enlargement present in the abdomen and pelvis. There are some small nodes along the pelvic sidewall bilaterally, not pathologic by size criteria and unchanged from prior. Reproductive: Again the prostate is  enlarged and heterogeneous with mass effect along the base of the bladder. Please correlate for BPH in the patient's PSA. Other: Small bilateral fat containing inguinal hernias. Small umbilical fat containing hernia as well. A small knuckle of small bowel extends along the umbilical hernia opening as well best seen on sagittal image 132. Musculoskeletal: Streak artifact with the patient's left hip arthroplasty obscuring the surrounding soft tissues. Scattered degenerative changes of the spine and pelvis. Slight curvature of the spine. Small sclerotic focus identified along the left pedicle at L2 is stable, possible bone island. If there is concern of osseous metastatic disease bone scan could be performed as clinically indicated. IMPRESSION: Further slight decrease in size of the left-sided lung nodule. No new mass lesion, lymph node enlargement. Stable left adrenal nodule. Enlarged prostate with mass effect along the bladder with bladder  wall thickening and luminal bladder stones. Please correlate with patient's PSA. Scattered reticulonodular changes of the lungs are stable. Some lung nodules are calcified. Small umbilical hernia with fat and a small knuckle of small bowel. 3 cm infrarenal abdominal aortic aneurysm. Recommend follow-up ultrasound every 3 years. This recommendation follows ACR consensus guidelines: White Paper of the ACR Incidental Findings Committee II on Vascular Findings. J Am Coll Radiol 2013; 10:789-794. Electronically Signed   By: Jill Side M.D.   On: 07/02/2022 13:07   CT Abdomen Pelvis W Contrast  Result Date: 07/02/2022 CLINICAL DATA:  Non-small-cell lung cancer * Tracking Code: BO * EXAM: CT CHEST, ABDOMEN, AND PELVIS WITH CONTRAST TECHNIQUE: Multidetector CT imaging of the chest, abdomen and pelvis was performed following the standard protocol during bolus administration of intravenous contrast. RADIATION DOSE REDUCTION: This exam was performed according to the departmental dose-optimization program which includes automated exposure control, adjustment of the mA and/or kV according to patient size and/or use of iterative reconstruction technique. CONTRAST:  151mL OMNIPAQUE IOHEXOL 300 MG/ML  SOLN COMPARISON:  None Available. 12/28/2021 and older FINDINGS: CT CHEST FINDINGS Cardiovascular: Scattered vascular calcifications seen along the normal caliber thoracic aorta. Coronary artery calcifications are seen. No pericardial effusion. The heart is enlarged. There is some calcifications along the mitral valve annulus. There is also some calcifications along the aortic valve. Mediastinum/Nodes: No specific abnormal lymph node enlargement identified in the axillary region. There are some small nodes along both hila, less than 1 cm in size not pathologic by size criteria. In addition these are unchanged from previous. Similar small nodes along the mediastinum. One larger node is seen to the left of the proximal aspect of  the left main bronchus on axial image 25 of series 504 measures 15 x 12 mm and previously in retrospect would have measured 16 by 13 mm, unchanged. Lungs/Pleura: Focal mass lesion in the lingula adjacent to the pleura with pleural retraction is again seen which previously measured 2.3 x 2.1 cm and today on image 28 of series 504 measures 1.8 by 1.6 cm. Adjacent spiculation and some patchy opacity. No separate additional lung nodules. There are scattered reticulonodular changes seen in the lungs however including greatest of the right lower lobe and similar to previous. There are some scattered areas of calcified lung nodules as well as centrilobular emphysematous changes. Apical pleural thickening. No pneumothorax or effusion. Musculoskeletal: Slight curvature of the spine with scattered degenerative changes. CT ABDOMEN PELVIS FINDINGS Hepatobiliary: Fatty liver infiltration. No enhancing liver lesion. Gallbladder is present. Pancreas: Mild pancreatic atrophy.  No discrete mass lesion. Spleen: Spleen is nonenlarged. Adrenals/Urinary Tract: Slight thickening of the inferior  margin of the right adrenal gland. There is a focal left adrenal nodule once again identified measuring diameter of 17 by 15 mm. Stable. Mild bilateral renal atrophy. Nonspecific perinephric stranding. Benign-appearing bilateral Bosniak 1 renal cysts are stable. No specific follow-up. No collecting system dilatation. Slight wall thickening of the urinary bladder with an enlarged prostate mass effect along the base of bladder. Dependent bladder stones are again seen. Stomach/Bowel: On this non oral contrast exam, the large bowel has a normal course and caliber with scattered stool. Colonic diverticulosis on the left side. Normal appendix. The stomach and small bowel are nondilated. No free air or free fluid. Vascular/Lymphatic: Diffuse vascular calcifications are identified along the aorta and branch vessels. Slight focal ectasia of the infrarenal  abdominal aorta with diameter approaching 3.0 by 2.8 cm. Similar to previous. Normal caliber IVC. No specific abnormal lymph node enlargement present in the abdomen and pelvis. There are some small nodes along the pelvic sidewall bilaterally, not pathologic by size criteria and unchanged from prior. Reproductive: Again the prostate is enlarged and heterogeneous with mass effect along the base of the bladder. Please correlate for BPH in the patient's PSA. Other: Small bilateral fat containing inguinal hernias. Small umbilical fat containing hernia as well. A small knuckle of small bowel extends along the umbilical hernia opening as well best seen on sagittal image 132. Musculoskeletal: Streak artifact with the patient's left hip arthroplasty obscuring the surrounding soft tissues. Scattered degenerative changes of the spine and pelvis. Slight curvature of the spine. Small sclerotic focus identified along the left pedicle at L2 is stable, possible bone island. If there is concern of osseous metastatic disease bone scan could be performed as clinically indicated. IMPRESSION: Further slight decrease in size of the left-sided lung nodule. No new mass lesion, lymph node enlargement. Stable left adrenal nodule. Enlarged prostate with mass effect along the bladder with bladder wall thickening and luminal bladder stones. Please correlate with patient's PSA. Scattered reticulonodular changes of the lungs are stable. Some lung nodules are calcified. Small umbilical hernia with fat and a small knuckle of small bowel. 3 cm infrarenal abdominal aortic aneurysm. Recommend follow-up ultrasound every 3 years. This recommendation follows ACR consensus guidelines: White Paper of the ACR Incidental Findings Committee II on Vascular Findings. J Am Coll Radiol 2013; 10:789-794. Electronically Signed   By: Jill Side M.D.   On: 07/02/2022 13:07    ASSESSMENT AND PLAN: This is a very pleasant 87 years old white male recently diagnosed  with Stage IV (T3, N2, M1c) non-small cell lung cancer, squamous cell carcinoma presented with large left upper lobe lung mass in addition to left hilar and mediastinal lymphadenopathy as well as bilateral pulmonary nodules and left adrenal gland lesion diagnosed in May 2022. The patient is currently undergoing systemic chemotherapy with carboplatin for AUC of 5, paclitaxel 175 Mg/M2 and Keytruda 200 Mg IV every 3 weeks with Neulasta support status post 9 cycles.  Starting from cycle #5 the patient is on maintenance treatment with Keytruda 200 Mg IV every 3 weeks.  This treatment was discontinued after cycle #5 secondary to persistent itching not responding to medications as well as the peripheral neuropathy. He is currently on observation and doing fine except for the shortness of breath with exertion as well as a peripheral neuropathy. He had repeat CT scan of the chest, abdomen and pelvis performed recently.  I personally and independently reviewed the scan and discussed the result with the patient today. His scan showed no  concerning findings for disease progression. I recommended for him to continue on observation with repeat CT scan of the chest, abdomen and pelvis in 6 months. For the peripheral neuropathy and COPD as well as coronary atherosclerosis, the patient was followed by Dr. Redmond School. He was advised to call immediately if he has any other concerning symptoms in the interval.  All questions were answered. The patient knows to call the clinic with any problems, questions or concerns. We can certainly see the patient much sooner if necessary.  Disclaimer: This note was dictated with voice recognition software. Similar sounding words can inadvertently be transcribed and may not be corrected upon review.

## 2022-07-08 ENCOUNTER — Telehealth: Payer: Self-pay | Admitting: Family Medicine

## 2022-07-08 NOTE — Telephone Encounter (Signed)
Left message for patient to call back and schedule Medicare Annual Wellness Visit (AWV) either virtually or in office. Left  my Herbie Drape number [phone]   Last AWV 08/21/21 please schedule with Nurse Health Adviser   45 min for awv-i  in office appointments 30 min for awv-s & awv-i phone/virtual appointments

## 2022-07-15 ENCOUNTER — Telehealth: Payer: Medicare Other | Admitting: Family Medicine

## 2022-07-16 ENCOUNTER — Other Ambulatory Visit: Payer: Self-pay | Admitting: Family Medicine

## 2022-07-16 ENCOUNTER — Telehealth (INDEPENDENT_AMBULATORY_CARE_PROVIDER_SITE_OTHER): Payer: Medicare Other | Admitting: Nurse Practitioner

## 2022-07-16 ENCOUNTER — Encounter: Payer: Self-pay | Admitting: Nurse Practitioner

## 2022-07-16 VITALS — HR 98 | Ht 73.0 in | Wt 260.0 lb

## 2022-07-16 DIAGNOSIS — U071 COVID-19: Secondary | ICD-10-CM

## 2022-07-16 MED ORDER — NIRMATRELVIR/RITONAVIR (PAXLOVID)TABLET: 3.0000 | ORAL_TABLET | Freq: Two times a day (BID) | ORAL | 0 refills | Status: AC

## 2022-07-16 NOTE — Progress Notes (Unsigned)
Virtual Visit Encounter mychart visit.   I connected with  Alex Edwards on 07/27/22 at 11:45 AM EST by secure video and audio telemedicine application. I verified that I am speaking with the correct person using two identifiers.   I introduced myself as a Publishing rights manager with the practice. The limitations of evaluation and management by telemedicine discussed with the patient and the availability of in person appointments. The patient expressed verbal understanding and consent to proceed.  Participating parties in this visit include: Myself and patient and spouse  The patient is: Patient Location: Home I am: Provider Location: Office/Clinic Subjective:    CC and HPI: Alex Edwards is a 87 y.o. year old male presenting for new evaluation and treatment of COVID. Patient reports the following: Taha tells me he has been experiencing cough, congestion, fevers, and chills since yesterday evening. He is having some shortness of breath with exertion, but his oxygen sats are maintaining at 94 right now. He tested positive for COVID this morning with his wife. He tells me overall he is not feeling well, but he does not feel that he is in need of urgent evaluation in the ED at this time. He tells me he has had COVID in the past and responded well to Paxlovid.    Past medical history, Surgical history, Family history not pertinant except as noted below, Social history, Allergies, and medications have been entered into the medical record, reviewed, and corrections made.   Review of Systems:  All review of systems negative except what is listed in the HPI  Objective:    Alert and oriented x 4 Audibly congested and ill appearing Speaking in clear sentences with no shortness of breath. No distress.  Impression and Recommendations:    Problem List Items Addressed This Visit     COVID-19 - Primary    New diagnosis of COVID 20 with multiple comorbidities and advanced age creating increased  risk of serious infection. Discussion of medication management made with patient and joint decision to begin treatment with paxlovid. At this time he appears to be doing ok and is not in any distress which is reassuring. We have reviewed emergency protocol for isolation and when to seek emergency care. He will continue with supportive therapies and let us know if he has any new or worsening symptoms.        orders and follow up as documented in EMR I discussed the assessment and treatment plan with the patient. The patient was provided an opportunity to ask questions and all were answered. The patient agreed with the plan and demonstrated an understanding of the instructions.   The patient was advised to call back or seek an in-person evaluation if the symptoms worsen or if the condition fails to improve as anticipated.  Follow-Up: prn  I provided 17 minutes of non-face-to-face interaction with this non face-to-face encounter including intake, same-day documentation, and chart review.   Tollie Eth, NP , DNP, AGNP-c [facility]

## 2022-07-25 DIAGNOSIS — C3492 Malignant neoplasm of unspecified part of left bronchus or lung: Secondary | ICD-10-CM | POA: Diagnosis not present

## 2022-07-25 DIAGNOSIS — J9601 Acute respiratory failure with hypoxia: Secondary | ICD-10-CM | POA: Diagnosis not present

## 2022-07-27 DIAGNOSIS — U071 COVID-19: Secondary | ICD-10-CM | POA: Insufficient documentation

## 2022-07-27 DIAGNOSIS — Z8616 Personal history of COVID-19: Secondary | ICD-10-CM | POA: Insufficient documentation

## 2022-07-27 NOTE — Assessment & Plan Note (Signed)
New diagnosis of COVID 39 with multiple comorbidities and advanced age creating increased risk of serious infection. Discussion of medication management made with patient and joint decision to begin treatment with paxlovid. At this time he appears to be doing ok and is not in any distress which is reassuring. We have reviewed emergency protocol for isolation and when to seek emergency care. He will continue with supportive therapies and let us know if he has any new or worsening symptoms.

## 2022-07-31 IMAGING — CT CT CHEST W/ CM
2 of 5 series · 12 of 36 positions shown, 15 images · IV contrast (APPLIED)
Comparison: Most recent CT chest, abdomen and pelvis 02/19/2021.
12/05/2020 PET-CT.

CLINICAL DATA: Primary Cancer Type: Lung
TECHNIQUE: Multidetector CT imaging of the chest was performed during
intravenous contrast administration.

CONTRAST:  80mL OMNIPAQUE IOHEXOL 350 MG/ML SOLN

[Series 2: cap with · axial · 0.98mm/px · z∈[-679,-134]mm · 9 of 137 slices shown, 12 images]
[im 14/137  mediastinal]
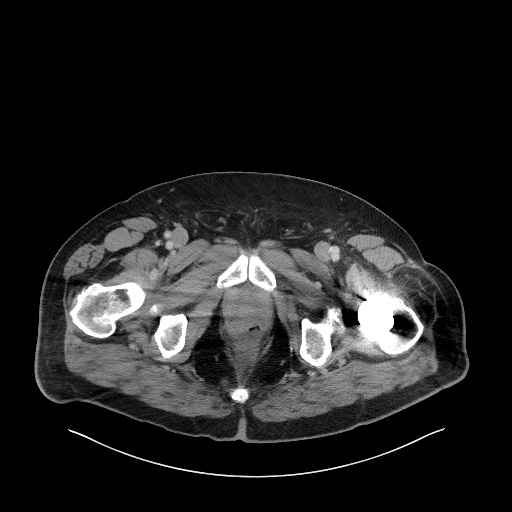
[im 14/137  lung]
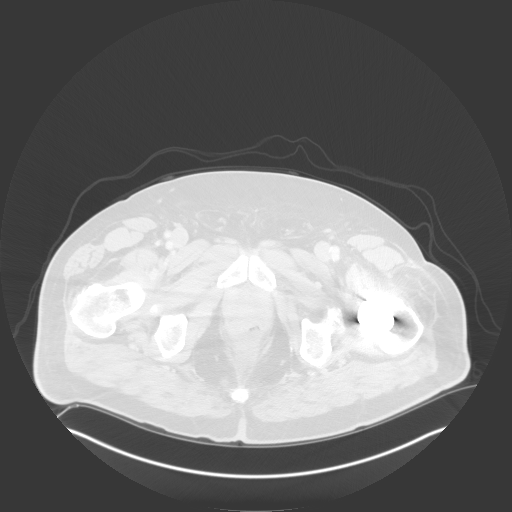
[im 28/137  lung]
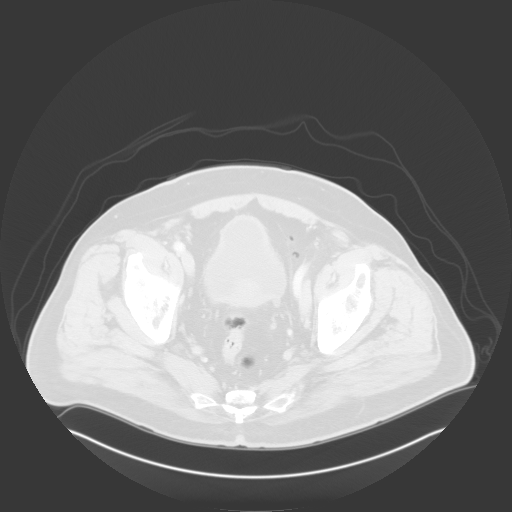
[im 41/137  lung]
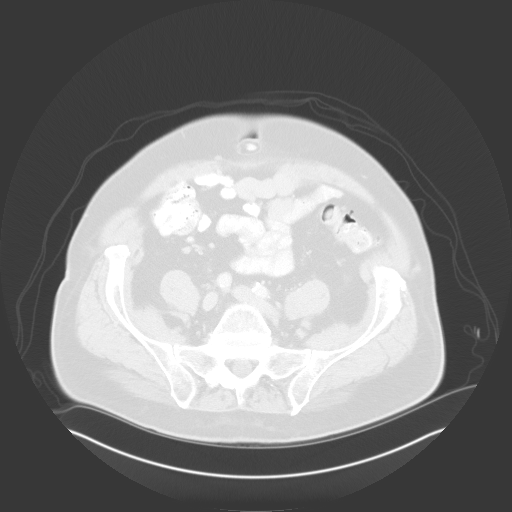
[im 55/137  lung]
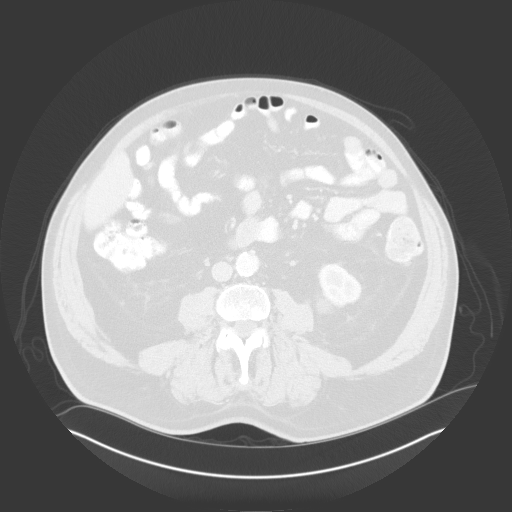
[im 69/137  mediastinal]
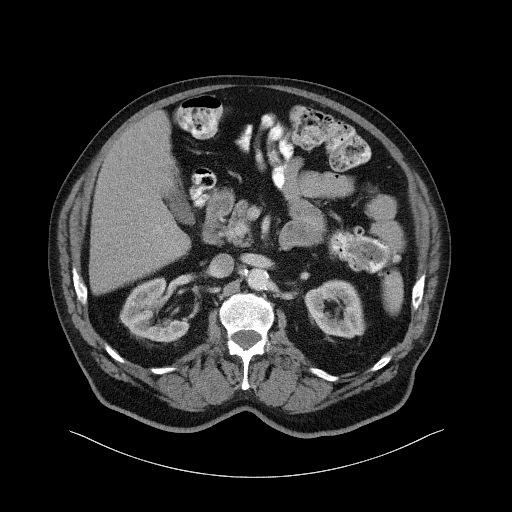
[im 69/137  lung]
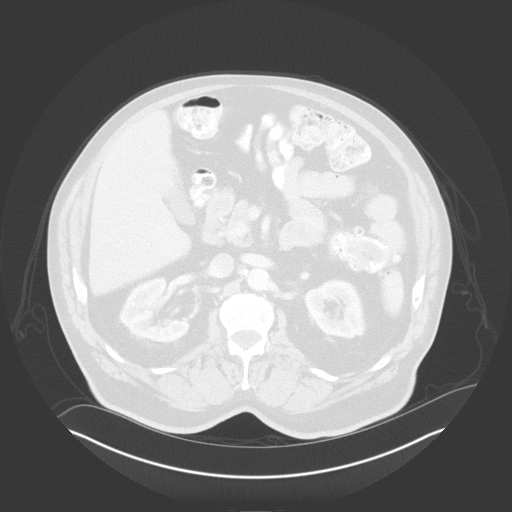
[im 82/137  lung]
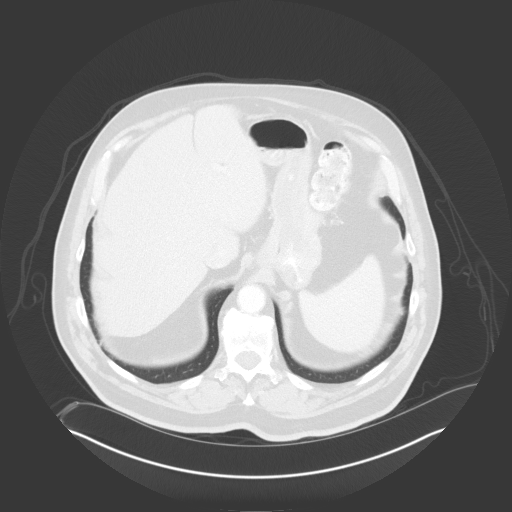
[im 96/137  lung]
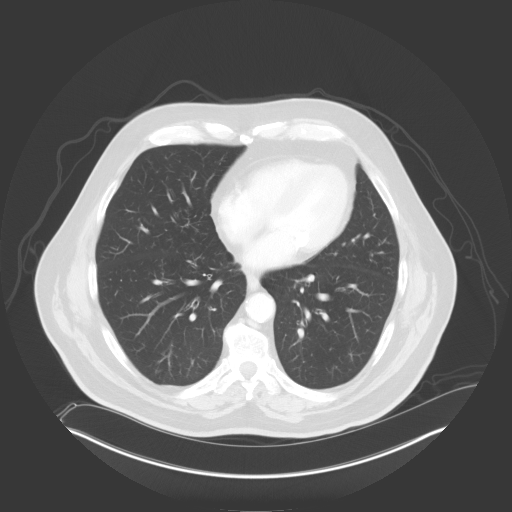
[im 109/137  lung]
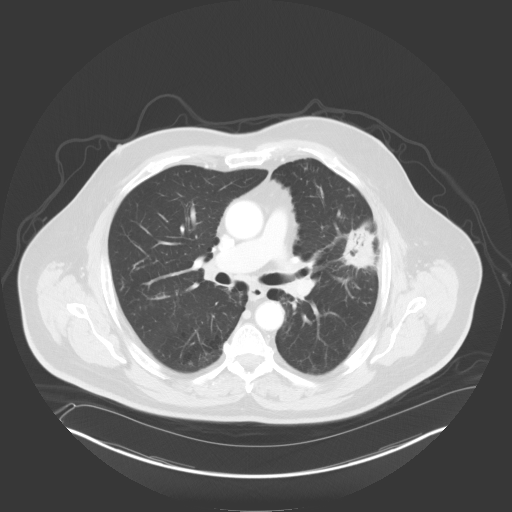
[im 123/137  mediastinal]
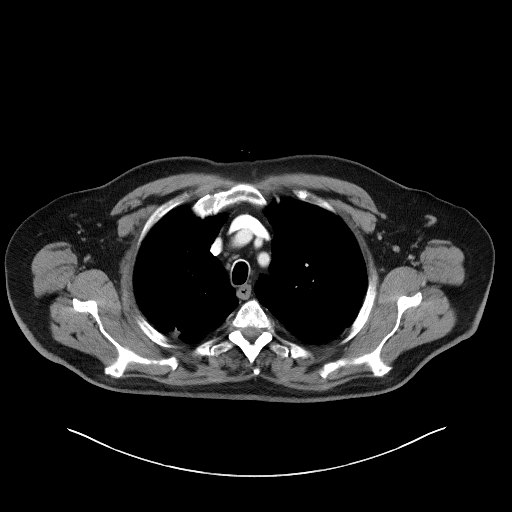
[im 123/137  lung]
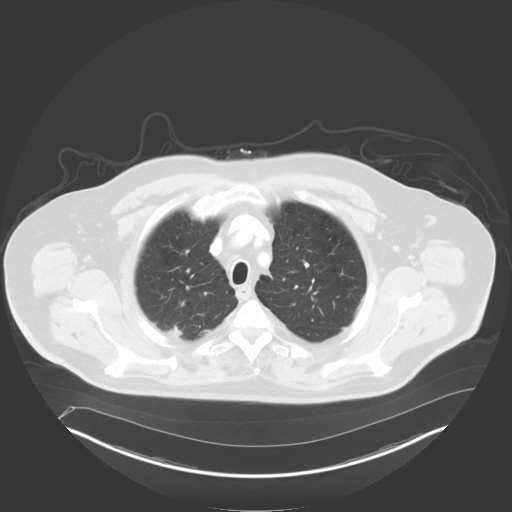

[Series 5: coronals · coronal · 0.82mm/px · 3 of 184 slices shown]
[im 37/184  lung]
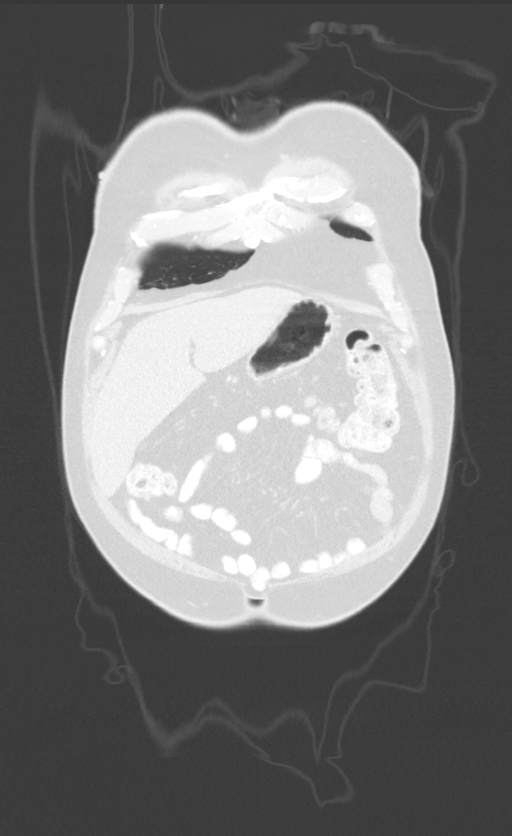
[im 74/184  lung]
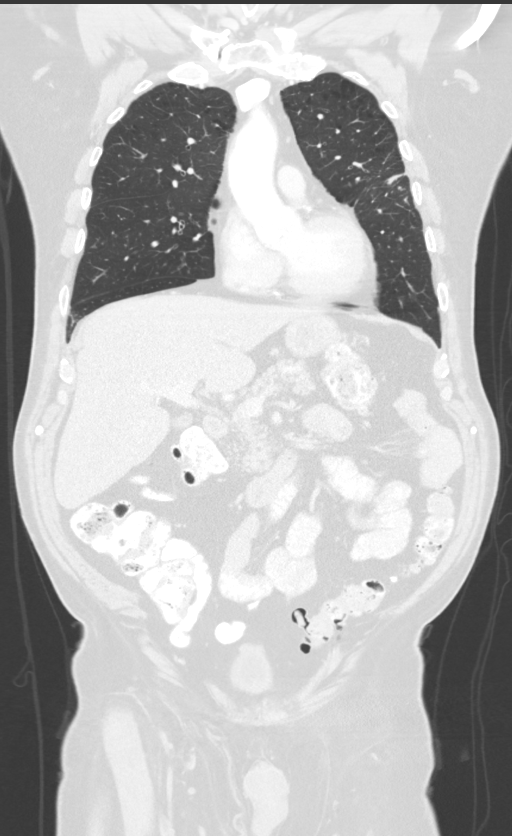
[im 110/184  lung]
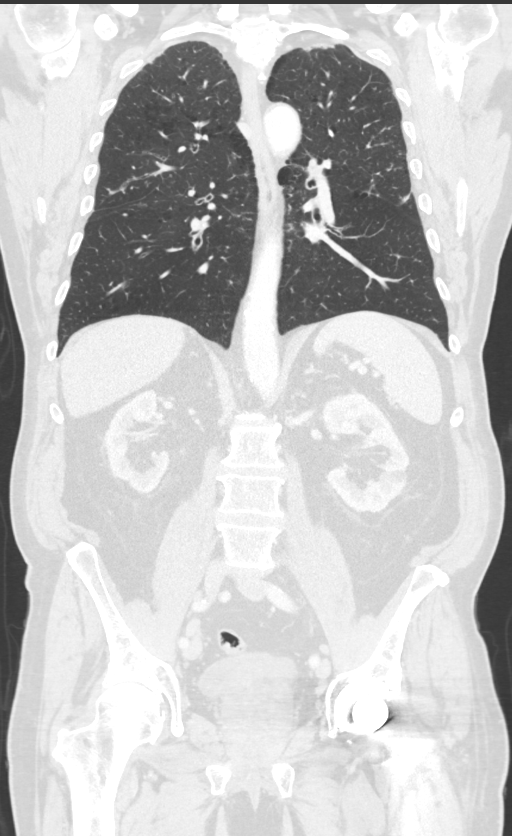

[12 of 36 positions shown; findings below may reference images not displayed]

Imaging Indication: Assess response to therapy

Interval therapy since last imaging? Yes

Initial Cancer Diagnosis

Date: 11/18/2020; Established by: Biopsy-proven

Detailed Pathology: Stage IV non-small cell lung cancer, squamous
cell carcinoma.

Primary Tumor location:  Left upper lobe. Left adrenal gland lesion.

Surgeries: No thoracic. Hip arthroplasty.

Chemotherapy: Yes; Ongoing? No; Most recent administration:
03/17/2021

Immunotherapy?  Yes; Type: Keytruda; Ongoing? Yes

Radiation therapy? No

EXAM:
CT CHEST ABDOMEN AND PELVIS WITH CONTRAST
FINDINGS: CT CHEST FINDINGS

Cardiovascular: Aortic atherosclerosis. Normal heart size. Left
coronary artery calcifications. No pericardial effusion.

Mediastinum/Nodes: Unchanged prominent left paratracheal lymph node
measuring up to 1.9 x 1.2 cm (series 2, image 25). Thyroid gland,
trachea, and esophagus demonstrate no significant findings.

Lungs/Pleura: Mild centrilobular and paraseptal emphysema. Diffuse
bilateral bronchial wall thickening. Interval decrease in size of a
partially cavitary, spiculated subpleural mass in the peripheral
left upper lobe, measuring 3.7 x 3.2 cm, previously 5.7 x 4.5 cm
(series 4, image 68). Unchanged irregular subpleural opacity of the
posterior right apex, measuring 1.2 x 1.1 cm (series 4, image 35).
Extensive clustered centrilobular and tree-in-bud nodularity, most
conspicuously in the right lung base (series 4, image 130) although
also seen in the lateral segment right middle lobe (series 4, image
98) and in the anterior left upper lobe (series 4, image 77).
Multiple new small nodules and irregular opacities seen on prior
examination are almost completely resolved, for example of the
medial posterior right upper lobe in the azygoesophageal recess
(series 4, image 63) and of the superior segment left lower lobe
(series 4, image 80). No pleural effusion or pneumothorax.

Musculoskeletal: No chest wall mass or suspicious bone lesions
identified.

CT ABDOMEN PELVIS FINDINGS

Hepatobiliary: No solid liver abnormality is seen. No gallstones,
gallbladder wall thickening, or biliary dilatation.

Pancreas: Unremarkable. No pancreatic ductal dilatation or
surrounding inflammatory changes.

Spleen: Normal in size without significant abnormality.

Adrenals/Urinary Tract: Unchanged nodule of the lateral limb of the
left adrenal gland measuring 1.6 x 1.5 cm (series 2, image 65).
Kidneys are normal, without renal calculi, solid lesion, or
hydronephrosis. Small calculi in the dependent right urinary bladder
(series 2, image 112).

Stomach/Bowel: Stomach is within normal limits. Appendix appears
normal. No evidence of bowel wall thickening, distention, or
inflammatory changes. Descending and sigmoid diverticulosis.

Vascular/Lymphatic: Aortic atherosclerosis. No enlarged abdominal or
pelvic lymph nodes.

Reproductive: Prostatomegaly.

Other: Fat containing bilateral inguinal hernias. No abdominopelvic
ascites.

Musculoskeletal: No acute or significant osseous findings.
IMPRESSION: 1. Interval decrease in size of a partially cavitary, spiculated
subpleural mass in the peripheral left upper lobe, consistent with
treatment response.
2. Unchanged prominent left paratracheal lymph nodes, previously FDG
avid.
3. Unchanged irregular subpleural opacity of the posterior right
apex, measuring 1.2 x 1.1 cm, which was new on prior examination and
remains nonspecific. Attention on follow-up.
4. Multiple other new small nodules and irregular opacities seen on
prior examination are almost completely resolved, consistent with
resolution of nonspecific infection or inflammation.
5. Unchanged left adrenal nodule, previously with equivocal FDG
avidity.
6. Extensive clustered centrilobular and tree-in-bud nodularity
throughout the lungs. Findings are consistent with atypical
infection, particularly atypical mycobacterium.
7. Prostatomegaly.
8. Small bladder calculi.
9. Emphysema.
10. Coronary artery disease.

Aortic Atherosclerosis (CVSHM-HY1.1) and Emphysema (CVSHM-27G.Y).

## 2022-08-02 ENCOUNTER — Other Ambulatory Visit: Payer: Self-pay | Admitting: Family Medicine

## 2022-08-02 DIAGNOSIS — R32 Unspecified urinary incontinence: Secondary | ICD-10-CM

## 2022-08-02 DIAGNOSIS — I10 Essential (primary) hypertension: Secondary | ICD-10-CM

## 2022-08-17 ENCOUNTER — Other Ambulatory Visit: Payer: Self-pay | Admitting: Family Medicine

## 2022-08-17 DIAGNOSIS — E039 Hypothyroidism, unspecified: Secondary | ICD-10-CM

## 2022-08-24 ENCOUNTER — Ambulatory Visit (INDEPENDENT_AMBULATORY_CARE_PROVIDER_SITE_OTHER): Payer: Medicare Other

## 2022-08-24 VITALS — Ht 73.0 in | Wt 258.0 lb

## 2022-08-24 DIAGNOSIS — Z0000 Encounter for general adult medical examination without abnormal findings: Secondary | ICD-10-CM | POA: Diagnosis not present

## 2022-08-24 NOTE — Patient Instructions (Addendum)
[name] , Thank you for taking time to come for your Medicare Wellness Visit. I appreciate your ongoing commitment to your health goals. Please review the following plan we discussed and let me know if I can assist you in the future.   These are the goals we discussed:  Goals      Patient Stated     08/21/2021, no goals     Patient Stated     08/24/2022, no goals     Track and Manage My Blood Pressure-Hypertension     Timeframe:  Long-Range Goal Priority:  Medium Start Date:                             Expected End Date:                       Follow Up Date 08/14/21    - check blood pressure weekly - choose a place to take my blood pressure (home, clinic or office, retail store) - write blood pressure results in a log or diary    Why is this important?   You won't feel high blood pressure, but it can still hurt your blood vessels.  High blood pressure can cause heart or kidney problems. It can also cause a stroke.  Making lifestyle changes like losing a little weight or eating less salt will help.  Checking your blood pressure at home and at different times of the day can help to control blood pressure.  If the doctor prescribes medicine remember to take it the way the doctor ordered.  Call the office if you cannot afford the medicine or if there are questions about it.     Notes:         This is a list of the screening recommended for you and due dates:  Health Maintenance  Topic Date Due   Eye exam for diabetics  Never done   DTaP/Tdap/Td vaccine (3 - Td or Tdap) 02/10/2022   COVID-19 Vaccine (5 - 2023-24 season) 02/26/2022   Yearly kidney health urinalysis for diabetes  06/01/2022   Complete foot exam   06/01/2022   Hemoglobin A1C  08/22/2022   Yearly kidney function blood test for diabetes  07/02/2023   Medicare Annual Wellness Visit  08/25/2023   Pneumonia Vaccine  Completed   Flu Shot  Completed   HPV Vaccine  Aged Out   Colon Cancer Screening  Discontinued    Zoster (Shingles) Vaccine  Discontinued    Advanced directives: Advance directive discussed with you today. Even though you declined this today please call our office should you change your mind and we can give you the proper paperwork for you to fill out.  Conditions/risks identified: none  Next appointment: Follow up in one year for your annual wellness visit.   Preventive Care 34 Years and Older, Male  Preventive care refers to lifestyle choices and visits with your health care provider that can promote health and wellness. What does preventive care include? A yearly physical exam. This is also called an annual well check. Dental exams once or twice a year. Routine eye exams. Ask your health care provider how often you should have your eyes checked. Personal lifestyle choices, including: Daily care of your teeth and gums. Regular physical activity. Eating a healthy diet. Avoiding tobacco and drug use. Limiting alcohol use. Practicing safe sex. Taking low doses of aspirin every day. Taking  vitamin and mineral supplements as recommended by your health care provider. What happens during an annual well check? The services and screenings done by your health care provider during your annual well check will depend on your age, overall health, lifestyle risk factors, and family history of disease. Counseling  Your health care provider may ask you questions about your: Alcohol use. Tobacco use. Drug use. Emotional well-being. Home and relationship well-being. Sexual activity. Eating habits. History of falls. Memory and ability to understand (cognition). Work and work Statistician. Screening  You may have the following tests or measurements: Height, weight, and BMI. Blood pressure. Lipid and cholesterol levels. These may be checked every 5 years, or more frequently if you are over 16 years old. Skin check. Lung cancer screening. You may have this screening every year starting at  age 2 if you have a 30-pack-year history of smoking and currently smoke or have quit within the past 15 years. Fecal occult blood test (FOBT) of the stool. You may have this test every year starting at age 74. Flexible sigmoidoscopy or colonoscopy. You may have a sigmoidoscopy every 5 years or a colonoscopy every 10 years starting at age 108. Prostate cancer screening. Recommendations will vary depending on your family history and other risks. Hepatitis C blood test. Hepatitis B blood test. Sexually transmitted disease (STD) testing. Diabetes screening. This is done by checking your blood sugar (glucose) after you have not eaten for a while (fasting). You may have this done every 1-3 years. Abdominal aortic aneurysm (AAA) screening. You may need this if you are a current or former smoker. Osteoporosis. You may be screened starting at age 57 if you are at high risk. Talk with your health care provider about your test results, treatment options, and if necessary, the need for more tests. Vaccines  Your health care provider may recommend certain vaccines, such as: Influenza vaccine. This is recommended every year. Tetanus, diphtheria, and acellular pertussis (Tdap, Td) vaccine. You may need a Td booster every 10 years. Zoster vaccine. You may need this after age 47. Pneumococcal 13-valent conjugate (PCV13) vaccine. One dose is recommended after age 68. Pneumococcal polysaccharide (PPSV23) vaccine. One dose is recommended after age 25. Talk to your health care provider about which screenings and vaccines you need and how often you need them. This information is not intended to replace advice given to you by your health care provider. Make sure you discuss any questions you have with your health care provider. Document Released: 07/11/2015 Document Revised: 03/03/2016 Document Reviewed: 04/15/2015 Elsevier Interactive Patient Education  2017 [place] Prevention in the Home Falls can  cause injuries. They can happen to people of all ages. There are many things you can do to make your home safe and to help prevent falls. What can I do on the outside of my home? Regularly fix the edges of walkways and driveways and fix any cracks. Remove anything that might make you trip as you walk through a door, such as a raised step or threshold. Trim any bushes or trees on the path to your home. Use bright outdoor lighting. Clear any walking paths of anything that might make someone trip, such as rocks or tools. Regularly check to see if handrails are loose or broken. Make sure that both sides of any steps have handrails. Any raised decks and porches should have guardrails on the edges. Have any leaves, snow, or ice cleared regularly. Use sand or salt on walking paths during winter.  Clean up any spills in your garage right away. This includes oil or grease spills. What can I do in the bathroom? Use night lights. Install grab bars by the toilet and in the tub and shower. Do not use towel bars as grab bars. Use non-skid mats or decals in the tub or shower. If you need to sit down in the shower, use a plastic, non-slip stool. Keep the floor dry. Clean up any water that spills on the floor as soon as it happens. Remove soap buildup in the tub or shower regularly. Attach bath mats securely with double-sided non-slip rug tape. Do not have throw rugs and other things on the floor that can make you trip. What can I do in the bedroom? Use night lights. Make sure that you have a light by your bed that is easy to reach. Do not use any sheets or blankets that are too big for your bed. They should not hang down onto the floor. Have a firm chair that has side arms. You can use this for support while you get dressed. Do not have throw rugs and other things on the floor that can make you trip. What can I do in the kitchen? Clean up any spills right away. Avoid walking on wet floors. Keep items  that you use a lot in easy-to-reach places. If you need to reach something above you, use a strong step stool that has a grab bar. Keep electrical cords out of the way. Do not use floor polish or wax that makes floors slippery. If you must use wax, use non-skid floor wax. Do not have throw rugs and other things on the floor that can make you trip. What can I do with my stairs? Do not leave any items on the stairs. Make sure that there are handrails on both sides of the stairs and use them. Fix handrails that are broken or loose. Make sure that handrails are as long as the stairways. Check any carpeting to make sure that it is firmly attached to the stairs. Fix any carpet that is loose or worn. Avoid having throw rugs at the top or bottom of the stairs. If you do have throw rugs, attach them to the floor with carpet tape. Make sure that you have a light switch at the top of the stairs and the bottom of the stairs. If you do not have them, ask someone to add them for you. What else can I do to help prevent falls? Wear shoes that: Do not have high heels. Have rubber bottoms. Are comfortable and fit you well. Are closed at the toe. Do not wear sandals. If you use a stepladder: Make sure that it is fully opened. Do not climb a closed stepladder. Make sure that both sides of the stepladder are locked into place. Ask someone to hold it for you, if possible. Clearly mark and make sure that you can see: Any grab bars or handrails. First and last steps. Where the edge of each step is. Use tools that help you move around (mobility aids) if they are needed. These include: Canes. Walkers. Scooters. Crutches. Turn on the lights when you go into a dark area. Replace any light bulbs as soon as they burn out. Set up your furniture so you have a clear path. Avoid moving your furniture around. If any of your floors are uneven, fix them. If there are any pets around you, be aware of where they  are. Review your medicines  with your doctor. Some medicines can make you feel dizzy. This can increase your chance of falling. Ask your doctor what other things that you can do to help prevent falls. This information is not intended to replace advice given to you by your health care provider. Make sure you discuss any questions you have with your health care provider. Document Released: 04/10/2009 Document Revised: 11/20/2015 Document Reviewed: 07/19/2014 Elsevier Interactive Patient Education  2017 [employer].

## 2022-08-24 NOTE — Progress Notes (Signed)
I connected with  Alex Edwards on 08/24/22 by a audio enabled telemedicine application and verified that I am speaking with the correct person using two identifiers.  Patient Location: Home  Provider Location: Office/Clinic  I discussed the limitations of evaluation and management by telemedicine. The patient expressed understanding and agreed to proceed.  Subjective:   Alex Edwards is a 87 y.o. male who presents for Medicare Annual/Subsequent preventive examination.  Review of Systems     Cardiac Risk Factors include: advanced age (>60mn, >>28women);hypertension;dyslipidemia;diabetes mellitus;male gender;obesity (BMI >30kg/m2)     Objective:    Today's Vitals   08/24/22 0956  Weight: 258 lb (117 kg)  Height: '6\' 1"'$  (1.854 m)   Body mass index is 34.04 kg/m.     08/24/2022   10:03 AM 07/05/2022    1:39 PM 12/30/2021   10:03 AM 09/29/2021    1:25 PM 08/21/2021    3:09 PM 06/08/2021   10:59 AM 06/08/2021   10:34 AM  Advanced Directives  Does Patient Have a Medical Advance Directive? No No No No No No Yes  Type of ASocial research officer, governmentLiving will HDixLiving will    [place] will  Does patient want to make changes to medical advance directive?   No - Patient declined   No - Patient declined No - Patient declined  Copy of [place] Chart?  No - copy requested No - copy requested    No - copy requested    Current Medications (verified) Outpatient Encounter Medications as of 08/24/2022  Medication Sig   Ascorbic Acid (VITAMIN C) 1000 MG tablet Take 1,000 mg by mouth daily.   Cholecalciferol (VITAMIN D-3) 125 MCG (5000 UT) TABS Take 1 tablet by mouth every other day.   hydrochlorothiazide (MICROZIDE) 12.5 MG capsule TAKE ONE CAPSULE BY MOUTH DAILY   ketoconazole (NIZORAL) 2 % cream Apply 1 application topically daily.   levothyroxine (SYNTHROID) 50 MCG tablet Take 1 tablet (50  mcg total) by mouth daily before breakfast.   losartan (COZAAR) 100 MG tablet TAKE ONE TABLET BY MOUTH DAILY   metoprolol tartrate (LOPRESSOR) 50 MG tablet TAKE ONE TABLET BY MOUTH TWICE DAILY   Multiple Vitamins-Minerals (PRESERVISION AREDS 2) CAPS Take 1 capsule by mouth in the morning and at bedtime.   [place] is to test one time a day DX: E11.9   ONETOUCH VERIO test strip USE TO TEST BLOOD SUGAR ONCE DAILY.   predniSONE (DELTASONE) 5 MG tablet Take 1 tablet (5 mg total) by mouth daily with breakfast.   tamsulosin (FLOMAX) 0.4 MG CAPS capsule Take 1 capsule (0.4 mg total) by mouth daily after supper.   acetaminophen (TYLENOL) 500 MG tablet Take 500 mg by mouth every 6 (six) hours as needed. (Patient not taking: Reported on 08/24/2022)   loratadine (CLARITIN) 10 MG tablet Take 10 mg by mouth daily. (Patient not taking: Reported on 08/24/2022)   triamcinolone cream (KENALOG) 0.1 % APPLY TO AFFECTED AREA TWICE A DAY. (Patient not taking: Reported on 05/25/2022)   No facility-administered encounter medications on file as of 08/24/2022.    Allergies (verified) Paclitaxel, Vancomycin, Amlodipine, and Latex   History: Past Medical History:  Diagnosis Date   BCE (basal cell epithelioma)    Bruises easily    on hands   Cough    smokers cough or perfurmes   Degeneration macular    right  eye   Diabetes mellitus without complication ([place])    Dyslipidemia    ED (erectile dysfunction)    Hx of adenomatous colonic polyps    Hyperlipidemia    Hypertension    borderline   Obesity    Psoriasis    Seasonal allergies    Smoker    former   Past Surgical History:  Procedure Laterality Date   BRONCHIAL BRUSHINGS  11/18/2020   Procedure: BRONCHIAL BRUSHINGS;  Surgeon: Spero Geralds, MD;  Location: [facility];  Service: Pulmonary;;   BRONCHIAL NEEDLE ASPIRATION BIOPSY  11/18/2020   Procedure: BRONCHIAL NEEDLE ASPIRATION BIOPSIES;  Surgeon: Spero Geralds, MD;   Location: [facility];  Service: Pulmonary;;   BRONCHIAL WASHINGS  11/18/2020   Procedure: BRONCHIAL WASHINGS;  Surgeon: Spero Geralds, MD;  Location: [facility];  Service: Pulmonary;;   CATARACT EXTRACTION, BILATERAL Bilateral 2013   COLONOSCOPY  2005   [facility]   ENDOBRONCHIAL ULTRASOUND N/A 11/18/2020   Procedure: ENDOBRONCHIAL ULTRASOUND;  Surgeon: Spero Geralds, MD;  Location: [facility];  Service: Pulmonary;  Laterality: N/A;   POLYPECTOMY     SKIN CANCER EXCISION Right 1993   under eye-MOHS, freeze multiple places freq   TONSILLECTOMY  as child   TOTAL HIP ARTHROPLASTY Left 01/04/2014   Procedure: LEFT TOTAL HIP ARTHROPLASTY ANTERIOR APPROACH;  Surgeon: Mcarthur Rossetti, MD;  Location: [facility];  Service: Orthopedics;  Laterality: Left;   TOTAL HIP ARTHROPLASTY Left 02/14/2014   Procedure: Irrigation and Debridement left hip;  Surgeon: Mcarthur Rossetti, MD;  Location: [facility];  Service: Orthopedics;  Laterality: Left;   Family History  Problem Relation Age of Onset   Heart disease Brother    Social History   Socioeconomic History   Marital status: Married    Spouse name: Not on file   Number of children: Not on file   Years of education: Not on file   Highest education level: Not on file  Occupational History   Not on file  Tobacco Use   Smoking status: Former    Packs/day: 1.00    Years: 50.00    Total pack years: 50.00    Types: Cigarettes    Quit date: 04/28/2014    Years since quitting: 8.3   Smokeless tobacco: Never  Vaping Use   Vaping Use: Never used  Substance and Sexual Activity   Alcohol use: No    Alcohol/week: 0.0 standard drinks of alcohol   Drug use: No   Sexual activity: Yes  Other Topics Concern   Not on file  Social History Narrative   Not on file   Social Determinants of Health   Financial Resource Strain: Medium Risk (08/24/2022)   Overall Financial Resource Strain (CARDIA)    Difficulty of Paying Living Expenses: Somewhat  hard  Food Insecurity: No Food Insecurity (08/24/2022)   Hunger Vital Sign    Worried About Running Out of Food in the Last Year: Never true    Ran Out of Food in the Last Year: Never true  Transportation Needs: No Transportation Needs (08/24/2022)   PRAPARE - Hydrologist (Medical): No    Lack of Transportation (Non-Medical): No  Physical Activity: Insufficiently Active (08/24/2022)   Exercise Vital Sign    Days of Exercise per Week: 3 days    Minutes of Exercise per Session: 20 min  Stress: No Stress Concern Present (08/24/2022)   [place]  Feeling of Stress : Not at all  Social Connections: Not on file    Tobacco Counseling Counseling given: Not Answered   Clinical Intake:  Pre-visit preparation completed: Yes  Pain : No/denies pain     Nutritional Status: BMI > 30  Obese Nutritional Risks: None Diabetes: Yes  How often do you need to have someone help you when you read instructions, pamphlets, or other written materials from your doctor or pharmacy?: 1 - Never  Diabetic? Yes Nutrition Risk Assessment:  Has the patient had any N/V/D within the last 2 months?  No  Does the patient have any non-healing wounds?  No  Has the patient had any unintentional weight loss or weight gain?  No   Diabetes:  Is the patient diabetic?  Yes  If diabetic, was a CBG obtained today?  No  Did the patient bring in their glucometer from home?  No  How often do you monitor your CBG's? daily.   Financial Strains and Diabetes Management:  Are you having any financial strains with the device, your supplies or your medication? Yes .  Does the patient want to be seen by Chronic Care Management for management of their diabetes?  No  Would the patient like to be referred to a Nutritionist or for Diabetic Management?  No   Diabetic Exams:  Diabetic Eye Exam: Overdue for diabetic eye exam. Pt  has been advised about the importance in completing this exam. Patient advised to call and schedule an eye exam. Diabetic Foot Exam: Overdue, Pt has been advised about the importance in completing this exam. Pt is scheduled for diabetic foot exam on next appointment.   Interpreter Needed?: No  Information entered by :: NAllen LPN   Activities of Daily Living    08/24/2022   10:05 AM 08/21/2022   12:37 PM  In your present state of health, do you have any difficulty performing the following activities:  Hearing? 0 0  Vision? 0 0  Difficulty concentrating or making decisions? 0 0  Walking or climbing stairs? 1 1  Comment gets SOB   Dressing or bathing? 0 0  Doing errands, shopping? 1 1  Preparing Food and eating ? N N  Using the Toilet? N N  In the past six months, have you accidently leaked urine? N N  Do you have problems with loss of bowel control? N N  Managing your Medications? N N  Managing your Finances? N N  Housekeeping or managing your Housekeeping? N N    Patient Care Team: [name], MD as PCP - General (Family Medicine) Valrie Hart, RN as Oncology Nurse Navigator (Oncology) Viona Gilmore, [facility] (Inactive) as Pharmacist (Pharmacist) Spero Geralds, MD as Consulting Physician (Pulmonary Disease)  Indicate any recent Medical Services you may have received from other than [facility] providers in the past year (date may be approximate).     Assessment:   This is a routine wellness examination for Sricharan.  Hearing/Vision screen Vision Screening - Comments:: No regular eye exams  Dietary issues and exercise activities discussed: Current Exercise Habits: Home exercise routine, Type of exercise: walking, Time (Minutes): 20, Frequency (Times/Week): 3, Weekly Exercise (Minutes/Week): 60   Goals Addressed             This Visit's Progress    Patient Stated       08/24/2022, no goals       Depression Screen    08/24/2022   10:04 AM 08/21/2021  3:10  PM 01/29/2021   10:59 AM 01/29/2020   10:32 AM 01/24/2019    9:41 AM 01/31/2018   11:13 AM 07/26/2016    8:50 AM  PHQ 2/9 Scores  PHQ - 2 Score 0 0 0 0 0 0 0  PHQ- 9 Score 6          Fall Risk    08/24/2022   10:03 AM 08/21/2022   12:37 PM 08/21/2021    3:10 PM 08/20/2021   11:10 AM 01/29/2021   10:58 AM  Fall Risk   Falls in the past year? 0 0 0 0 0  Number falls in past yr: 0    0  Injury with Fall? 0    0  Risk for fall due to : Medication side effect  Medication side effect  No Fall Risks  Follow up Falls prevention discussed;Education provided;Falls evaluation completed  Falls evaluation completed;Education provided;Falls prevention discussed  Falls evaluation completed    FALL RISK PREVENTION PERTAINING TO THE HOME:  Any stairs in or around the home? Yes  If so, are there any without handrails? No  Home free of loose throw rugs in walkways, pet beds, electrical cords, etc? Yes  Adequate lighting in your home to reduce risk of falls? Yes   ASSISTIVE DEVICES UTILIZED TO PREVENT FALLS:  Life alert? No  Use of a cane, walker or w/c? No  Grab bars in the bathroom? Yes  Shower chair or bench in shower? No  Elevated toilet seat or a handicapped toilet? No   TIMED UP AND GO:  Was the test performed? No .      Cognitive Function:        08/24/2022   10:07 AM  6CIT Screen  What Year? 0 points  What month? 0 points  What time? 0 points  Count back from 20 0 points  Months in reverse 0 points  Repeat phrase 0 points  Total Score 0 points    Immunizations Immunization History  Administered Date(s) Administered   Fluad Quad(high Dose 65+) 04/10/2019, 04/09/2020, 04/14/2021, 04/07/2022   Influenza Split 04/22/2009, 07/02/2010, 05/12/2011, 03/28/2012   Influenza, High Dose Seasonal PF 04/02/2013, 04/23/2014, 04/09/2015, 03/16/2016, 03/29/2017, 04/17/2018   PFIZER Comirnaty(Gray Top)Covid-19 Tri-Sucrose Vaccine 01/29/2021   PFIZER(Purple Top)SARS-COV-2 Vaccination  08/24/2019, 09/18/2019, 05/28/2020   Pneumococcal Conjugate-13 04/23/2014   Pneumococcal Polysaccharide-23 03/29/2005   Td 03/29/2005   Tdap 02/11/2012    TDAP status: Due, Education has been provided regarding the importance of this vaccine. Advised may receive this vaccine at local pharmacy or Health Dept. Aware to provide a copy of the vaccination record if obtained from local pharmacy or Health Dept. Verbalized acceptance and understanding.  Flu Vaccine status: Up to date  Pneumococcal vaccine status: Up to date  Covid-19 vaccine status: Completed vaccines  Qualifies for Shingles Vaccine? Yes   Zostavax completed No   Shingrix Completed?: No.    Education has been provided regarding the importance of this vaccine. Patient has been advised to call insurance company to determine out of pocket expense if they have not yet received this vaccine. Advised may also receive vaccine at local pharmacy or Health Dept. Verbalized acceptance and understanding.  Screening Tests Health Maintenance  Topic Date Due   OPHTHALMOLOGY EXAM  Never done   DTaP/Tdap/Td (3 - Td or Tdap) 02/10/2022   COVID-19 Vaccine (5 - 2023-24 season) 02/26/2022   Diabetic kidney evaluation - Urine ACR  06/01/2022   FOOT EXAM  06/01/2022   Medicare  Annual Wellness (AWV)  08/21/2022   HEMOGLOBIN A1C  08/22/2022   Diabetic kidney evaluation - eGFR measurement  07/02/2023   Pneumonia Vaccine 23+ Years old  Completed   INFLUENZA VACCINE  Completed   HPV VACCINES  Aged Out   COLONOSCOPY (Pts 45-96yr Insurance coverage will need to be confirmed)  Discontinued   Zoster Vaccines- Shingrix  Discontinued    Health Maintenance  Health Maintenance Due  Topic Date Due   OPHTHALMOLOGY EXAM  Never done   DTaP/Tdap/Td (3 - Td or Tdap) 02/10/2022   COVID-19 Vaccine (5 - 2023-24 season) 02/26/2022   Diabetic kidney evaluation - Urine ACR  06/01/2022   FOOT EXAM  06/01/2022   Medicare Annual Wellness (AWV)  08/21/2022    HEMOGLOBIN A1C  08/22/2022    Colorectal cancer screening: No longer required.   Lung Cancer Screening: (Low Dose CT Chest recommended if Age 85-80years, 30 pack-year currently smoking OR have quit w/in 15years.) does not qualify.   Lung Cancer Screening Referral: no  Additional Screening:  Hepatitis C Screening: does not qualify;    Vision Screening: Recommended annual ophthalmology exams for early detection of glaucoma and other disorders of the eye. Is the patient up to date with their annual eye exam?  No  Who is the provider or what is the name of the office in which the patient attends annual eye exams? none If pt is not established with a provider, would they like to be referred to a provider to establish care? No .   Dental Screening: Recommended annual dental exams for proper oral hygiene  Community Resource Referral / Chronic Care Management: CRR required this visit?  No   CCM required this visit?  No      Plan:     I have personally reviewed and noted the following in the patient's chart:   Medical and social history Use of alcohol, tobacco or illicit drugs  Current medications and supplements including opioid prescriptions. Patient is not currently taking opioid prescriptions. Functional ability and status Nutritional status Physical activity Advanced directives List of other physicians Hospitalizations, surgeries, and ER visits in previous 12 months Vitals Screenings to include cognitive, depression, and falls Referrals and appointments  In addition, I have reviewed and discussed with patient certain preventive protocols, quality metrics, and best practice recommendations. A written personalized care plan for preventive services as well as general preventive health recommendations were provided to patient.     NKellie Simmering LPN   [id number]  Nurse Notes: none  Due to this being a virtual visit, the after visit summary with patients personalized  plan was offered to patient via mail or my-chart.  Patient would like to access on my-chart

## 2022-08-25 ENCOUNTER — Encounter: Payer: Self-pay | Admitting: Family Medicine

## 2022-08-25 ENCOUNTER — Telehealth: Payer: Self-pay

## 2022-08-25 ENCOUNTER — Ambulatory Visit (INDEPENDENT_AMBULATORY_CARE_PROVIDER_SITE_OTHER): Payer: Medicare Other | Admitting: Family Medicine

## 2022-08-25 VITALS — BP 110/50 | HR 88 | Temp 98.0°F | Resp 28 | Ht 73.0 in | Wt 253.4 lb

## 2022-08-25 DIAGNOSIS — J301 Allergic rhinitis due to pollen: Secondary | ICD-10-CM | POA: Diagnosis not present

## 2022-08-25 DIAGNOSIS — C801 Malignant (primary) neoplasm, unspecified: Secondary | ICD-10-CM

## 2022-08-25 DIAGNOSIS — E119 Type 2 diabetes mellitus without complications: Secondary | ICD-10-CM

## 2022-08-25 DIAGNOSIS — E785 Hyperlipidemia, unspecified: Secondary | ICD-10-CM | POA: Diagnosis not present

## 2022-08-25 DIAGNOSIS — E1169 Type 2 diabetes mellitus with other specified complication: Secondary | ICD-10-CM | POA: Diagnosis not present

## 2022-08-25 DIAGNOSIS — J9601 Acute respiratory failure with hypoxia: Secondary | ICD-10-CM | POA: Diagnosis not present

## 2022-08-25 DIAGNOSIS — I152 Hypertension secondary to endocrine disorders: Secondary | ICD-10-CM | POA: Diagnosis not present

## 2022-08-25 DIAGNOSIS — G63 Polyneuropathy in diseases classified elsewhere: Secondary | ICD-10-CM

## 2022-08-25 DIAGNOSIS — I6521 Occlusion and stenosis of right carotid artery: Secondary | ICD-10-CM | POA: Diagnosis not present

## 2022-08-25 DIAGNOSIS — M353 Polymyalgia rheumatica: Secondary | ICD-10-CM

## 2022-08-25 DIAGNOSIS — I700 Atherosclerosis of aorta: Secondary | ICD-10-CM

## 2022-08-25 DIAGNOSIS — E1159 Type 2 diabetes mellitus with other circulatory complications: Secondary | ICD-10-CM

## 2022-08-25 DIAGNOSIS — C3492 Malignant neoplasm of unspecified part of left bronchus or lung: Secondary | ICD-10-CM | POA: Diagnosis not present

## 2022-08-25 DIAGNOSIS — Z0000 Encounter for general adult medical examination without abnormal findings: Secondary | ICD-10-CM | POA: Diagnosis not present

## 2022-08-25 DIAGNOSIS — E669 Obesity, unspecified: Secondary | ICD-10-CM

## 2022-08-25 DIAGNOSIS — R32 Unspecified urinary incontinence: Secondary | ICD-10-CM

## 2022-08-25 LAB — POCT GLYCOSYLATED HEMOGLOBIN (HGB A1C): Hemoglobin A1C: 7.8 % — AB (ref 4.0–5.6)

## 2022-08-25 MED ORDER — METFORMIN HCL ER 750 MG PO TB24: 750.0000 mg | ORAL_TABLET | Freq: Every day | ORAL | 5 refills | Status: DC

## 2022-08-25 NOTE — Telephone Encounter (Signed)
Per Dr.Lalonde, patient needs portable oxygen ordered through [place]. I called [facility] to see what they needed from [place]. [facility] needs office notes that document patient's oxygen levels while walking. They will also need a written order faxed to [phone]. Per [facility], patient refused portable services in December, and signed a waiver, so this is why he has to do another walking pulse oximetry test again. Your schedule next week only has 15 minute slots available. Is it ok to schedule patient in a 15 minute slot to come back in for a walking pulse oximetry test? [facility] needs an office note with these readings.

## 2022-08-25 NOTE — Progress Notes (Signed)
Complete physical exam  Patient: Alex Edwards   DOB: 01/11/1936   87 y.o. Male  MRN: [id number]  Subjective:    Chief Complaint  Patient presents with   Annual Exam    Had AWV with [facility] on 08/24/2022. No additional concerns.     Alex Edwards is a 87 y.o. male who presents today for a complete physical exam. He reports consuming a general diet. Home exercise routine includes walking 20 minutes a day, 3 times a week. He generally feels fairly well. He reports sleeping poorly. He states that he has had difficulty with breathing worse in the last month or so but was vague as to whether it could have been longer.  He does become short of breath and slightly tachypneic with any physical activity and notes his pulse ox drops down to 88.  He does use the oxygen at night although he states his pulse ox is in the 94 rate.  He describes more of a tachypnea than a true drop in his pulse ox.  He also has concerns over his blood pressure being slightly low and is wondering if any of medicines are contributing to it.  He does have a history of PMR and presently is on 5 mg of prednisone which is keeping the aches and pains under control.  He does complain of neuropathy but at the present time is not interested in any therapy for this.   Most recent fall risk assessment:    08/24/2022   10:03 AM  Fall Risk   Falls in the past year? 0  Number falls in past yr: 0  Injury with Fall? 0  Risk for fall due to : Medication side effect  Follow up Falls prevention discussed;Education provided;Falls evaluation completed     Most recent depression screenings:    08/24/2022   10:04 AM 08/21/2021    3:10 PM  PHQ 2/9 Scores  PHQ - 2 Score 0 0  PHQ- 9 Score 6     Vision:Not within last year  and Dental: No regular dental care     Patient Care Team: [name], MD as PCP - General (Family Medicine) Valrie Hart, RN as Oncology Nurse Navigator (Oncology) Viona Gilmore, [facility] (Inactive) as  Pharmacist (Pharmacist) Spero Geralds, MD as Consulting Physician (Pulmonary Disease)   Outpatient Medications Prior to Visit  Medication Sig Note   Ascorbic Acid (VITAMIN C) 1000 MG tablet Take 1,000 mg by mouth daily.    Cholecalciferol (VITAMIN D-3) 125 MCG (5000 UT) TABS Take 1 tablet by mouth every other day.    hydrochlorothiazide (MICROZIDE) 12.5 MG capsule TAKE ONE CAPSULE BY MOUTH DAILY    ketoconazole (NIZORAL) 2 % cream Apply 1 application topically daily.    levothyroxine (SYNTHROID) 50 MCG tablet Take 1 tablet (50 mcg total) by mouth daily before breakfast.    losartan (COZAAR) 100 MG tablet TAKE ONE TABLET BY MOUTH DAILY    metoprolol tartrate (LOPRESSOR) 50 MG tablet TAKE ONE TABLET BY MOUTH TWICE DAILY    Multiple Vitamins-Minerals (PRESERVISION AREDS 2) CAPS Take 1 capsule by mouth in the morning and at bedtime.    [place] Patient is to test one time a day DX: E11.9    ONETOUCH VERIO test strip USE TO TEST BLOOD SUGAR ONCE DAILY.    predniSONE (DELTASONE) 5 MG tablet Take 1 tablet (5 mg total) by mouth daily with breakfast.    tamsulosin (FLOMAX)  0.4 MG CAPS capsule Take 1 capsule (0.4 mg total) by mouth daily after supper.    triamcinolone cream (KENALOG) 0.1 % APPLY TO AFFECTED AREA TWICE A DAY.    acetaminophen (TYLENOL) 500 MG tablet Take 500 mg by mouth every 6 (six) hours as needed. (Patient not taking: Reported on 08/24/2022) 08/25/2022: prn     loratadine (CLARITIN) 10 MG tablet Take 10 mg by mouth daily. (Patient not taking: Reported on 08/24/2022)    No facility-administered medications prior to visit.    Review of Systems  All other systems reviewed and are negative.         Objective:     BP (!) 110/50 (BP Location: Right Arm, Cuff Size: Large)   Pulse 88   Temp 98 F (36.7 C) (Oral)   Resp (!) 28   Ht '6\' 1"'$  (1.854 m)   Wt 253 lb 6.4 oz (114.9 kg)   SpO2 96% Comment: room air  BMI 33.43 kg/m    Physical Exam   Alert and in no distress.  Cardiac exam difficult to hear but appears normal.  Lungs show distant breath sounds. 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)  Controlled type 2 diabetes mellitus without complication, without long-term current use of insulin ([place]) - Plan: POCT glycosylated hemoglobin (Hb A1C), metFORMIN (GLUCOPHAGE-XR) 750 MG 24 hr tablet  Hyperlipidemia associated with type 2 diabetes mellitus ([place]) - Plan: Lipid panel  Obesity (BMI 30-39.9)  Urinary incontinence, unspecified type  PMR (polymyalgia rheumatica) (HCC)  Neuropathy associated with cancer ([place])  Immunization History  Administered Date(s) Administered   Fluad Quad(high Dose 65+) 04/10/2019, 04/09/2020, 04/14/2021, 04/07/2022   Influenza Split 04/22/2009, 07/02/2010, 05/12/2011, 03/28/2012   Influenza, High Dose Seasonal PF 04/02/2013, 04/23/2014, 04/09/2015, 03/16/2016, 03/29/2017, 04/17/2018   PFIZER Comirnaty(Gray Top)Covid-19 Tri-Sucrose Vaccine 01/29/2021   PFIZER(Purple Top)SARS-COV-2 Vaccination 08/24/2019, 09/18/2019, 05/28/2020   Pneumococcal Conjugate-13 04/23/2014   Pneumococcal Polysaccharide-23 03/29/2005   Td 03/29/2005   Tdap 02/11/2012    Health Maintenance  Topic Date Due   OPHTHALMOLOGY EXAM  Never done   DTaP/Tdap/Td (3 - Td or Tdap) 02/10/2022   Diabetic kidney evaluation - Urine ACR  06/01/2022   FOOT EXAM  06/01/2022   COVID-19 Vaccine (5 - 2023-24 season) 09/10/2022 (Originally 02/26/2022)   HEMOGLOBIN A1C  02/23/2023   Diabetic  kidney evaluation - eGFR measurement  07/02/2023   Medicare Annual Wellness (AWV)  08/25/2023   Pneumonia Vaccine 27+ Years old  Completed   INFLUENZA VACCINE  Completed   HPV VACCINES  Aged Out   COLONOSCOPY (Pts 45-57yr Insurance coverage will need to be confirmed)  Discontinued   Zoster Vaccines- Shingrix  Discontinued     Problem List Items Addressed This Visit     Allergic rhinitis due to pollen   Aortic atherosclerosis ([place]   Controlled type 2 diabetes mellitus without complication, without long-term current use of insulin (HCC)   Relevant Medications   metFORMIN (GLUCOPHAGE-XR) 750 MG 24 hr tablet   Other Relevant Orders   POCT  glycosylated hemoglobin (Hb A1C) (Completed)   Hyperlipidemia associated with type 2 diabetes mellitus (HCC)   Relevant Medications   metFORMIN (GLUCOPHAGE-XR) 750 MG 24 hr tablet   Other Relevant Orders   Lipid panel   Hypertension associated with diabetes ([place])   Relevant Medications   metFORMIN (GLUCOPHAGE-XR) 750 MG 24 hr tablet   Internal carotid artery stenosis, right   Obesity (BMI 30-39.9)   Relevant Medications   metFORMIN (GLUCOPHAGE-XR) 750 MG 24 hr tablet   Senile purpura (HCC)   Stage IV squamous cell carcinoma of left lung ([place])  After discussion with him, he will hold tamsulosin and prednisone.  He will keep me informed as to his urinary problems as well as the aches and pains from the PMR.  I will work on getting him a portable unit so he can become more active outside the home. Follow-up 2 weeks   Jill Alexanders, MD

## 2022-08-26 LAB — LIPID PANEL
Chol/HDL Ratio: 5.6 ratio — ABNORMAL HIGH (ref 0.0–5.0)
Cholesterol, Total: 207 mg/dL — ABNORMAL HIGH (ref 100–199)
HDL: 37 mg/dL — ABNORMAL LOW (ref >39)
LDL Chol Calc (NIH): 128 mg/dL — ABNORMAL HIGH (ref 0–99)
Triglycerides: 233 mg/dL — ABNORMAL HIGH (ref 0–149)
VLDL Cholesterol Cal: 42 mg/dL — ABNORMAL HIGH (ref 5–40)

## 2022-08-26 NOTE — Telephone Encounter (Signed)
Scheduled patient for Tues 08/31/22 @ 10:30.

## 2022-08-31 ENCOUNTER — Ambulatory Visit (INDEPENDENT_AMBULATORY_CARE_PROVIDER_SITE_OTHER): Payer: Medicare Other | Admitting: Family Medicine

## 2022-08-31 ENCOUNTER — Other Ambulatory Visit: Payer: Self-pay | Admitting: *Deleted

## 2022-08-31 ENCOUNTER — Encounter: Payer: Self-pay | Admitting: Family Medicine

## 2022-08-31 VITALS — BP 110/70 | HR 84 | Ht 73.0 in | Wt 251.8 lb

## 2022-08-31 DIAGNOSIS — C3492 Malignant neoplasm of unspecified part of left bronchus or lung: Secondary | ICD-10-CM

## 2022-08-31 NOTE — Progress Notes (Signed)
   Subjective:    Patient ID: Alex Edwards, male    DOB: April 08, 1936, 87 y.o.   MRN: [id number]  HPI He is here for consult concerning the use of oxygen therapy at home.  He does have an oxygen concentrator at home that he uses especially when he gets up and moves around.  He would like to have a portable one to help allow him to move out of the house.   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

## 2022-09-06 ENCOUNTER — Other Ambulatory Visit: Payer: Self-pay | Admitting: *Deleted

## 2022-09-06 DIAGNOSIS — C3492 Malignant neoplasm of unspecified part of left bronchus or lung: Secondary | ICD-10-CM

## 2022-09-08 ENCOUNTER — Encounter: Payer: Self-pay | Admitting: Family Medicine

## 2022-09-08 ENCOUNTER — Ambulatory Visit (INDEPENDENT_AMBULATORY_CARE_PROVIDER_SITE_OTHER): Payer: Medicare Other | Admitting: Family Medicine

## 2022-09-08 VITALS — BP 100/52 | HR 72 | Temp 97.9°F | Resp 24 | Wt 250.8 lb

## 2022-09-08 DIAGNOSIS — C3492 Malignant neoplasm of unspecified part of left bronchus or lung: Secondary | ICD-10-CM | POA: Diagnosis not present

## 2022-09-08 DIAGNOSIS — R32 Unspecified urinary incontinence: Secondary | ICD-10-CM | POA: Diagnosis not present

## 2022-09-08 DIAGNOSIS — M353 Polymyalgia rheumatica: Secondary | ICD-10-CM

## 2022-09-08 DIAGNOSIS — J301 Allergic rhinitis due to pollen: Secondary | ICD-10-CM | POA: Diagnosis not present

## 2022-09-08 NOTE — Progress Notes (Signed)
   Subjective:    Patient ID: Alex Edwards, male    DOB: 1935-08-28, 87 y.o.   MRN: [id number]  HPI He is here for follow-up.  He stopped taking the prednisone and states that he is having just slight difficulty with shoulder discomfort.  He also has noted allergy symptoms and plans to start back taking Claritin.  He apparently alternates that with Allegra.  He also has noted some urinary difficulties and wants to get back on his Flomax.  He continues to use his oxygen on an as-needed basis.  In spite of the fact that his pO2 at night is 94 above he still likes to use the oxygen because he says it helps him sleep.   Review of Systems     Objective:   Physical Exam Alert and in no distress.  Blood pressure is recorded.       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  He is really not sure he wants to pursue any further CT scans and is not interested in any further chemo or radiation. He will start back on Flomax. Recommend he get on Claritin or Allegra to help with his allergies. Reassured him that his present blood pressure was okay. He will call if the PMR symptoms return.

## 2022-09-08 NOTE — Progress Notes (Deleted)
Complete physical exam  Patient: Alex Edwards   DOB: [birth year]   87 y.o. Male  MRN: [id number]  Subjective:    Chief Complaint  Patient presents with   Follow-up    Blood pressure follow up.     Alex Edwards is a 87 y.o. male who presents today for a complete physical exam. He reports consuming a {diet types:17450} diet. {types:19826} He generally feels {DESC; WELL/FAIRLY WELL/POORLY:18703}. He reports sleeping {DESC; WELL/FAIRLY WELL/POORLY:18703}. He {does/does not:200015} have additional problems to discuss today.    Most recent fall risk assessment:    08/24/2022   10:03 AM  Fall Risk   Falls in the past year? 0  Number falls in past yr: 0  Injury with Fall? 0  Risk for fall due to : Medication side effect  Follow up Falls prevention discussed;Education provided;Falls evaluation completed     Most recent depression screenings:    08/24/2022   10:04 AM 08/21/2021    3:10 PM  PHQ 2/9 Scores  PHQ - 2 Score 0 0  PHQ- 9 Score 6     {VISON DENTAL STD PSA (Optional):27386}  {History (Optional):23778}  Patient Care Team: [name], MD as PCP - General (Family Medicine) Valrie Hart, RN as Oncology Nurse Navigator (Oncology) Viona Gilmore, [facility] (Inactive) as Pharmacist (Pharmacist) Spero Geralds, MD as Consulting Physician (Pulmonary Disease)   Outpatient Medications Prior to Visit  Medication Sig Note   Ascorbic Acid (VITAMIN C) 1000 MG tablet Take 1,000 mg by mouth daily.    Cholecalciferol (VITAMIN D-3) 125 MCG (5000 UT) TABS Take 1 tablet by mouth every other day.    hydrochlorothiazide (MICROZIDE) 12.5 MG capsule TAKE ONE CAPSULE BY MOUTH DAILY    ketoconazole (NIZORAL) 2 % cream Apply 1 application topically daily.    levothyroxine (SYNTHROID) 50 MCG tablet Take 1 tablet (50 mcg total) by mouth daily before breakfast.    loratadine (CLARITIN) 10 MG tablet Take 10 mg by mouth daily.    losartan (COZAAR) 100 MG tablet TAKE ONE TABLET BY MOUTH  DAILY    metFORMIN (GLUCOPHAGE-XR) 750 MG 24 hr tablet Take 1 tablet (750 mg total) by mouth daily with breakfast.    metoprolol tartrate (LOPRESSOR) 50 MG tablet TAKE ONE TABLET BY MOUTH TWICE DAILY    Multiple Vitamins-Minerals (PRESERVISION AREDS 2) CAPS Take 1 capsule by mouth in the morning and at bedtime.    [place] Patient is to test one time a day DX: E11.9    ONETOUCH VERIO test strip USE TO TEST BLOOD SUGAR ONCE DAILY.    triamcinolone cream (KENALOG) 0.1 % APPLY TO AFFECTED AREA TWICE A DAY.    acetaminophen (TYLENOL) 500 MG tablet Take 500 mg by mouth every 6 (six) hours as needed. (Patient not taking: Reported on 08/24/2022)    predniSONE (DELTASONE) 5 MG tablet Take 1 tablet (5 mg total) by mouth daily with breakfast. (Patient not taking: Reported on 08/31/2022) 09/08/2022: On hold for the past 2 weeks   tamsulosin (FLOMAX) 0.4 MG CAPS capsule Take 1 capsule (0.4 mg total) by mouth daily after supper. (Patient not taking: Reported on 08/31/2022) 09/08/2022: On hold for the past 2 weeks   No facility-administered medications prior to visit.    ROS        Objective:     BP (!) 100/52 (BP Location: Right Arm, Cuff Size: Large)   Pulse 72   Temp 97.9 F (36.6  C) (Oral)   Resp (!) 24   Wt 250 lb 12.8 oz (113.8 kg)   SpO2 96% Comment: room air  BMI 33.09 kg/m  {Vitals History (Optional):23777}  Physical Exam   No results found for any visits on 09/08/22. {Show previous labs (optional):23779}    Assessment & Plan:    Routine Health Maintenance and Physical Exam  Immunization History  Administered Date(s) Administered   Fluad Quad(high Dose 65+) 04/10/2019, 04/09/2020, 04/14/2021, 04/07/2022   Influenza Split 04/22/2009, 07/02/2010, 05/12/2011, 03/28/2012   Influenza, High Dose Seasonal PF 04/02/2013, 04/23/2014, 04/09/2015, 03/16/2016, 03/29/2017, 04/17/2018   PFIZER Comirnaty(Gray Top)Covid-19 Tri-Sucrose Vaccine 01/29/2021   PFIZER(Purple  Top)SARS-COV-2 Vaccination 08/24/2019, 09/18/2019, 05/28/2020   Pneumococcal Conjugate-13 04/23/2014   Pneumococcal Polysaccharide-23 03/29/2005   Td 03/29/2005   Tdap 02/11/2012    Health Maintenance  Topic Date Due   OPHTHALMOLOGY EXAM  Never done   DTaP/Tdap/Td (3 - Td or Tdap) 02/10/2022   Diabetic kidney evaluation - Urine ACR  06/01/2022   FOOT EXAM  06/01/2022   COVID-19 Vaccine (5 - 2023-24 season) 09/10/2022 (Originally 02/26/2022)   HEMOGLOBIN A1C  02/23/2023   Diabetic kidney evaluation - eGFR measurement  07/02/2023   Medicare Annual Wellness (AWV)  08/25/2023   Pneumonia Vaccine 5+ Years old  Completed   INFLUENZA VACCINE  Completed   HPV VACCINES  Aged Out   COLONOSCOPY (Pts 45-75yr Insurance coverage will need to be confirmed)  Discontinued   Zoster Vaccines- Shingrix  Discontinued    Discussed health benefits of physical activity, and encouraged him to engage in regular exercise appropriate for his age and condition.  Problem List Items Addressed This Visit   None  No follow-ups on file.     JJill Alexanders MD

## 2022-09-23 ENCOUNTER — Telehealth (INDEPENDENT_AMBULATORY_CARE_PROVIDER_SITE_OTHER): Payer: Medicare Other | Admitting: Family Medicine

## 2022-09-23 VITALS — BP 137/73 | HR 73 | Wt 250.0 lb

## 2022-09-23 DIAGNOSIS — C3492 Malignant neoplasm of unspecified part of left bronchus or lung: Secondary | ICD-10-CM

## 2022-09-23 DIAGNOSIS — J40 Bronchitis, not specified as acute or chronic: Secondary | ICD-10-CM | POA: Diagnosis not present

## 2022-09-23 DIAGNOSIS — J9601 Acute respiratory failure with hypoxia: Secondary | ICD-10-CM | POA: Diagnosis not present

## 2022-09-23 MED ORDER — AMOXICILLIN-POT CLAVULANATE 875-125 MG PO TABS: 1.0000 | ORAL_TABLET | Freq: Two times a day (BID) | ORAL | 0 refills | Status: DC

## 2022-09-23 NOTE — Progress Notes (Signed)
   Subjective:    Patient ID: Alex Edwards, male    DOB: October 14, 1935, 87 y.o.   MRN: [id number]  HPI Documentation for virtual audio and video telecommunications through [place] encounter: The patient was located at home. 2 patient identifiers used.  The provider was located in the office. The patient did consent to this visit and is aware of possible charges through their insurance for this visit. The other persons participating in this telemedicine service were none. Time spent on call was 5 minutes and in review of previous records >20 minutes total for counseling and coordination of care.  This virtual service is not related to other E/M service within previous 7 days.  He states that last Thursday he developed a slight cough, rhinorrhea, itchy watery eyes and some slight shortness of breath more than normal.  The coughing over the last several days has gotten much worse.  He does have underlying lung cancer.  Review of Systems     Objective:   Physical Exam Alert and in no distress, otherwise not seen       Assessment & Plan:  Bronchitis - Plan: amoxicillin-clavulanate (AUGMENTIN) 875-125 MG tablet  Stage IV squamous cell carcinoma of left lung ([place]) With his underlying lung cancer I think is reasonable to place him on antibiotic to see if it will help get him back to his normal state.

## 2022-10-24 DIAGNOSIS — J9601 Acute respiratory failure with hypoxia: Secondary | ICD-10-CM | POA: Diagnosis not present

## 2022-10-24 DIAGNOSIS — C3492 Malignant neoplasm of unspecified part of left bronchus or lung: Secondary | ICD-10-CM | POA: Diagnosis not present

## 2022-11-08 NOTE — Progress Notes (Signed)
Care Management & Coordination Services Pharmacy Note  11/11/2022 Name:  Alex Edwards MRN:  [id number] DOB:  1935/12/20  Summary: A1C not at goal <7, denies any sugars outside of range at home LDL not at goal <70  Recommendations/Changes made from today's visit: -Counseled on cholesterol lowering diet as patient declines any med therapies -Due for updated A1C, recommended patient schedule f/u with PCP, he requests to call back at another time in July  Follow up plan: -General check in Aug -Pharmacist visit in Dec   Subjective: Alex Edwards is an 87 y.o. year old male who is a primary patient of Ronnald Nian, MD.  The care coordination team was consulted for assistance with disease management and care coordination needs.    Engaged with patient by telephone for follow up visit.  Recent office visits: 09/23/22 Sharlot Gowda, MD - For bronchitis. START augmentin  09/08/22 Sharlot Gowda, MD - For stage IV lung cancer and other concerns. Restart flomax and recommended OTC claritin or allegra to help with allergies  08/31/22 Sharlot Gowda, MD - For Stage IV lung cancer and other concerns. Order placed for portable oxygen through [employer]  08/25/22 Sharlot Gowda, MD - For routine medical exam and other concerns. Start metformin 750mg  daily  08/24/22 Elisha Ponder, LPN - For AWV, no medication changes.  07/16/22 Enid Skeens, NP - Video visit for COVID-19. START paxlovid  05/25/22 Sharlot Gowda, MD - For Stage IV Lung cancer and other concerns. START levothyroxine and prednisone 5mg   Recent consult visits: 07/05/22 Si Gaul, MD (Oncology) - For lung cancer, currently chemo on hold due to side effects, in observation mode. No med changes  Hospital visits: None in previous 6 months   Objective:  Lab Results  Component Value Date   CREATININE 1.16 07/01/2022   BUN 20 07/01/2022   GFRNONAA >60 07/01/2022   GFRAA 84 01/29/2020   NA 138 07/01/2022   K 4.4 07/01/2022    CALCIUM 9.5 07/01/2022   CO2 30 07/01/2022   GLUCOSE 114 (H) 07/01/2022    Lab Results  Component Value Date/Time   HGBA1C 7.8 (A) 08/25/2022 11:33 AM   HGBA1C 6.3 (A) 02/19/2022 11:57 AM   HGBA1C 6.4 (H) 11/18/2020 04:43 AM   HGBA1C 6.7 (H) 07/26/2016 09:21 AM   MICROALBUR 26.0 06/01/2021 01:00 PM   MICROALBUR 51.8 01/29/2020 02:26 PM    Last diabetic Eye exam: No results found for: "HMDIABEYEEXA"  Last diabetic Foot exam: No results found for: "HMDIABFOOTEX"   Lab Results  Component Value Date   CHOL 207 (H) 08/25/2022   HDL 37 (L) 08/25/2022   LDLCALC 128 (H) 08/25/2022   TRIG 233 (H) 08/25/2022   CHOLHDL 5.6 (H) 08/25/2022       Latest Ref Rng & Units 07/01/2022   11:14 AM 12/28/2021    3:17 PM 09/25/2021    2:37 PM  Hepatic Function  Total Protein 6.5 - 8.1 g/dL 6.7  7.1  8.0   Albumin 3.5 - 5.0 g/dL 4.3  4.2  4.4   AST 15 - 41 U/L 13  14  15    ALT 0 - 44 U/L 11  14  11    Alk Phosphatase 38 - 126 U/L 63  56  67   Total Bilirubin 0.3 - 1.2 mg/dL 0.5  0.4  0.4     Lab Results  Component Value Date/Time   TSH 3.049 07/01/2022 11:29 AM   TSH 5.150 (H) 05/18/2022 12:18 PM  TSH 3.193 12/28/2021 03:17 PM       Latest Ref Rng & Units 07/01/2022   11:14 AM 12/28/2021    3:17 PM 09/25/2021    2:37 PM  CBC  WBC 4.0 - 10.5 K/uL 7.8  7.6  7.5   Hemoglobin 13.0 - 17.0 g/dL 16.1  09.6  04.5   Hematocrit 39.0 - 52.0 % 37.2  36.1  38.6   Platelets 150 - 400 K/uL 219  212  209     No results found for: "VD25OH", "VITAMINB12"  Clinical ASCVD: Yes  The ASCVD Risk score (Arnett DK, et al., 2019) failed to calculate for the following reasons:   The 2019 ASCVD risk score is only valid for ages 6 to 31       08/24/2022   10:04 AM 08/21/2021    3:10 PM 01/29/2021   10:59 AM  Depression screen PHQ 2/9  Decreased Interest 0 0 0  Down, Depressed, Hopeless 0 0 0  PHQ - 2 Score 0 0 0  Altered sleeping 3    Tired, decreased energy 3    Change in appetite 0    Feeling bad or  failure about yourself  0    Trouble concentrating 0    Moving slowly or fidgety/restless 0    Suicidal thoughts 0    PHQ-9 Score 6    Difficult doing work/chores Not difficult at all       Social History   Tobacco Use  Smoking Status Former   Packs/day: 1.00   Years: 50.00   Additional pack years: 0.00   Total pack years: 50.00   Types: Cigarettes   Quit date: 04/28/2014   Years since quitting: 8.5  Smokeless Tobacco Never   BP Readings from Last 3 Encounters:  09/23/22 137/73  09/08/22 (!) 100/52  08/31/22 110/70   Pulse Readings from Last 3 Encounters:  09/23/22 73  09/08/22 72  08/31/22 84   Wt Readings from Last 3 Encounters:  09/23/22 250 lb (113.4 kg)  09/08/22 250 lb 12.8 oz (113.8 kg)  08/31/22 251 lb 12.8 oz (114.2 kg)   BMI Readings from Last 3 Encounters:  09/23/22 32.98 kg/m  09/08/22 33.09 kg/m  08/31/22 33.22 kg/m    Allergies  Allergen Reactions   Paclitaxel Shortness Of Breath    SOB and light headedness, flushing and 8/10 pain in left flank Pepcid and Solumedrol administered.  Able to tolerate remainder of Taxol infusion   Vancomycin     Red Man Syndrome    Amlodipine Swelling   Latex Other (See Comments)    "possibly allergic- dry, itching, rash"    Medications Reviewed Today     Reviewed by Sherrill Raring, RPH (Pharmacist) on 11/11/22 at 0947  Med List Status: <None>   Medication Order Taking? Sig Documenting Provider Last Dose Status Informant  acetaminophen (TYLENOL) 500 MG tablet [id number] No Take 500 mg by mouth every 6 (six) hours as needed. [provider] Taking Active            Med Note Deretha Emory, ROSHENA L   Wed Aug 25, 2022 10:49 AM) prn    amoxicillin-clavulanate (AUGMENTIN) 875-125 MG tablet [id number]  Take 1 tablet by mouth 2 (two) times daily. Ronnald Nian, MD  Active   Ascorbic Acid (VITAMIN C) 1000 MG tablet [id number] No Take 1,000 mg by mouth daily. [provider] Taking Active Self   Cholecalciferol (VITAMIN D-3) 125 MCG (5000 UT) TABS [id number] No Take 1 tablet  by mouth every other day. [provider] Taking Active            Med Note Sonia Baller Aug 31, 2022 10:48 AM)    hydrochlorothiazide (MICROZIDE) 12.5 MG capsule [id number] No TAKE ONE CAPSULE BY MOUTH DAILY Ronnald Nian, MD Taking Active   ketoconazole (NIZORAL) 2 % cream [id number] No Apply 1 application topically daily.  Patient not taking: Reported on 09/23/2022   Ronnald Nian, MD Not Taking Active            Med Note Melonie [place]   Tue Aug 31, 2022 10:51 AM) Used this morning  levothyroxine (SYNTHROID) 50 MCG tablet [id number]  Take 1 tablet (50 mcg total) by mouth daily before breakfast. Ronnald Nian, MD  Active   loratadine (CLARITIN) 10 MG tablet [id number] No Take 10 mg by mouth daily. [provider] Taking Active            Med Note Katrinka Blazing, Roma Schanz   Tue Aug 31, 2022 10:51 AM) Has been out of x 3 weeks  losartan (COZAAR) 100 MG tablet [id number] No TAKE ONE TABLET BY MOUTH DAILY Ronnald Nian, MD Taking Active   metFORMIN (GLUCOPHAGE-XR) 750 MG 24 hr tablet [id number] No Take 1 tablet (750 mg total) by mouth daily with breakfast. Ronnald Nian, MD Taking Active            Med Note Katrinka Blazing, Roma Schanz   Tue Aug 31, 2022 10:50 AM) Sugars have been 85-110 fasting qam, 100-120 2hrs post prandial  metoprolol tartrate (LOPRESSOR) 50 MG tablet [id number] No TAKE ONE TABLET BY MOUTH TWICE DAILY Ronnald Nian, MD Taking Active   Multiple Vitamins-Minerals (PRESERVISION AREDS 2) CAPS [id number] No Take 1 capsule by mouth in the morning and at bedtime. [provider] Taking Active Self  Dola [place] LANCETS FINE [place] [id number] No Patient is to test one time a day DX: E11.9 Ronnald Nian, MD Taking Active Self  [facility] VERIO test strip [id number] No USE TO TEST BLOOD SUGAR ONCE DAILY. Ronnald Nian, MD Taking Active   predniSONE (DELTASONE) 5 MG tablet  [id number] No Take 1 tablet (5 mg total) by mouth daily with breakfast.  Patient not taking: Reported on 08/31/2022   Ronnald Nian, MD Not Taking Active            Med Note [facility], Dorita Sciara   Wed Sep 08, 2022 11:09 AM) On hold for the past 2 weeks  tamsulosin (FLOMAX) 0.4 MG CAPS capsule [id number] No Take 1 capsule (0.4 mg total) by mouth daily after supper. Ronnald Nian, MD Taking Active            Med Note [facility], Dorita Sciara   Wed Sep 08, 2022 11:09 AM) On hold for the past 2 weeks  triamcinolone cream (KENALOG) 0.1 % [id number] No APPLY TO AFFECTED AREA TWICE A DAY. Ronnald Nian, MD Taking Active            Med Note Katrinka Blazing, Roma Schanz   Tue Aug 31, 2022 10:51 AM) As needed            SDOH:  (Social Determinants of Health) assessments and interventions performed: Yes SDOH Interventions    Flowsheet Row Care Coordination from 11/11/2022 in [facility] Clinical Support from 08/24/2022 in [facility] Chronic Care Management from 05/13/2022 in [facility] Telephone from 03/26/2022 in [facility]  Coordination Chronic Care Management from 05/14/2021 in [facility]  SDOH Interventions       Food Insecurity Interventions Intervention Not Indicated Intervention Not Indicated -- -- --  Housing Interventions Intervention Not Indicated -- -- Intervention Not Indicated --  Transportation Interventions -- Intervention Not Indicated -- Intervention Not Indicated Intervention Not Indicated  Financial Strain Interventions -- Intervention Not Indicated Intervention Not Indicated -- Intervention Not Indicated  Physical Activity Interventions -- Intervention Not Indicated -- -- --  Stress Interventions -- Intervention Not Indicated -- -- --       Medication Assistance: None required.  Patient affirms current coverage meets needs.  Medication Access: Within the past 30 days, how often has patient missed a dose of  medication?  Is a pillbox or other method used to improve adherence? Yes  Factors that may affect medication adherence? no barriers identified Are meds synced by current pharmacy? No  Are meds delivered by current pharmacy? No  Does patient experience delays in picking up medications due to transportation concerns? No   [facility] Reviewed: Is patient disadvantaged to use [facility]?: No  Current Rx insurance plan: [facility] Name and location of Current pharmacy:  [facility] [place], [place] - [address] [facility] [address] [place] [address] Phone: [phone] Fax: [phone]  [id number] - [place], [place] - [address] AT [facility] [address] [place] [address] Phone: [phone] Fax: [phone]  [facility] services reviewed with patient today?: No  Patient requests to transfer care to [facility]?: No  Reason patient declined to change pharmacies: Not mentioned at this visit  Compliance/Adherence/Medication fill history: Care Gaps: Eye exam - never done - declines to sch through [facility] today, plans to call his eye dr to schedule Tdap vaccine -  last 02/11/2012 Covid booster - last in 2022 Diabetic kidney exam - last urine micro in 2022 - plans to request updated labs at next pcp visit Foot exam -  last in 05/2021  Star-Rating Drugs: Losartan 100mg  PDC 94% Metformin XR 750mg  PDC 100%   Assessment/Plan Hyperlipidemia: (LDL goal < 70) -Uncontrolled -Current treatment: None -Medications previously tried: Atorvastatin, zetia (myalgias) -Current dietary patterns: not discussed -Current exercise habits: limited right now, has gait issues post-chemo and working on getting portable oxygen through [employer] -Educated on Cholesterol goals;  Importance of limiting foods high in cholesterol; -Counseled on diet and exercise extensively  Diabetes (A1c goal  <7%) -Uncontrolled -Current medications: Metformin XR 750mg  1 tab daily -Medications previously tried: None  -Current home glucose readings fasting glucose: 95-115, 100 this morning post prandial glucose: 175 yesterday -Denies hypoglycemic/hyperglycemic symptoms -Current meal patterns:  Mindful of carbs, denies any sugars outside of [place] range -Current exercise: see above -Educated on A1c and blood sugar goals; Benefits of routine self-monitoring of blood sugar; Counseled to check feet daily and get yearly eye exams -Counseled to check feet daily and get yearly eye exams -Recommended to continue current medication  Sherrill Raring Clinical Pharmacist [phone]

## 2022-11-10 ENCOUNTER — Telehealth: Payer: Self-pay

## 2022-11-10 ENCOUNTER — Other Ambulatory Visit: Payer: Self-pay | Admitting: Family Medicine

## 2022-11-10 DIAGNOSIS — E039 Hypothyroidism, unspecified: Secondary | ICD-10-CM

## 2022-11-10 NOTE — Progress Notes (Signed)
Patient ID: Alex Edwards, male   DOB: 1935-08-24, 87 y.o.   MRN: [id number]  Care Management & Coordination Services Pharmacy Team  Reason for Encounter: Appointment Reminder  Contacted patient to confirm telephone appointment with Milas Kocher, PharmD on 11/11/22 at 9:30. Spoke with patient on 11/10/2022     Star Rating Drugs:  Losartan (Cozaar) 100 mg - Last filled 10/02/22 90 DS at [facility] Gaps: Eye Exam - Overdue TDAP - Overdue COVID Booster - Overdue Diabetic Urine - Overdue Foot Exam - Overdue AWV - 08/24/22   Pamala Duffel CMA Clinical Pharmacist Assistant [phone]

## 2022-11-11 ENCOUNTER — Ambulatory Visit: Payer: Medicare Other

## 2022-11-23 DIAGNOSIS — C3492 Malignant neoplasm of unspecified part of left bronchus or lung: Secondary | ICD-10-CM | POA: Diagnosis not present

## 2022-11-23 DIAGNOSIS — J9601 Acute respiratory failure with hypoxia: Secondary | ICD-10-CM | POA: Diagnosis not present

## 2022-12-02 ENCOUNTER — Other Ambulatory Visit: Payer: Self-pay | Admitting: Family Medicine

## 2022-12-22 ENCOUNTER — Other Ambulatory Visit: Payer: Self-pay | Admitting: Family Medicine

## 2022-12-22 DIAGNOSIS — E1159 Type 2 diabetes mellitus with other circulatory complications: Secondary | ICD-10-CM

## 2022-12-23 ENCOUNTER — Other Ambulatory Visit: Payer: Self-pay | Admitting: Family Medicine

## 2022-12-23 DIAGNOSIS — I10 Essential (primary) hypertension: Secondary | ICD-10-CM

## 2022-12-24 DIAGNOSIS — J9601 Acute respiratory failure with hypoxia: Secondary | ICD-10-CM | POA: Diagnosis not present

## 2022-12-24 DIAGNOSIS — C3492 Malignant neoplasm of unspecified part of left bronchus or lung: Secondary | ICD-10-CM | POA: Diagnosis not present

## 2022-12-28 ENCOUNTER — Other Ambulatory Visit: Payer: Self-pay | Admitting: Family Medicine

## 2022-12-31 ENCOUNTER — Inpatient Hospital Stay: Payer: Medicare Other

## 2023-01-04 ENCOUNTER — Inpatient Hospital Stay: Payer: Medicare Other | Admitting: Internal Medicine

## 2023-01-04 ENCOUNTER — Emergency Department (HOSPITAL_COMMUNITY): Payer: Medicare Other

## 2023-01-04 ENCOUNTER — Observation Stay (HOSPITAL_COMMUNITY): Payer: Medicare Other

## 2023-01-04 ENCOUNTER — Telehealth: Payer: Self-pay | Admitting: Internal Medicine

## 2023-01-04 ENCOUNTER — Observation Stay (HOSPITAL_COMMUNITY)
Admission: EM | Admit: 2023-01-04 | Discharge: 2023-01-06 | Disposition: A | Discharge status: Home Health | Payer: Medicare Other | Attending: Family Medicine | Admitting: Family Medicine

## 2023-01-04 ENCOUNTER — Encounter (HOSPITAL_COMMUNITY): Payer: Self-pay

## 2023-01-04 ENCOUNTER — Other Ambulatory Visit: Payer: Self-pay

## 2023-01-04 DIAGNOSIS — Z79899 Other long term (current) drug therapy: Secondary | ICD-10-CM | POA: Diagnosis not present

## 2023-01-04 DIAGNOSIS — I672 Cerebral atherosclerosis: Secondary | ICD-10-CM | POA: Diagnosis not present

## 2023-01-04 DIAGNOSIS — R6889 Other general symptoms and signs: Secondary | ICD-10-CM | POA: Diagnosis not present

## 2023-01-04 DIAGNOSIS — R0602 Shortness of breath: Secondary | ICD-10-CM | POA: Diagnosis not present

## 2023-01-04 DIAGNOSIS — Z96642 Presence of left artificial hip joint: Secondary | ICD-10-CM | POA: Diagnosis not present

## 2023-01-04 DIAGNOSIS — G459 Transient cerebral ischemic attack, unspecified: Secondary | ICD-10-CM | POA: Diagnosis not present

## 2023-01-04 DIAGNOSIS — E669 Obesity, unspecified: Secondary | ICD-10-CM | POA: Diagnosis present

## 2023-01-04 DIAGNOSIS — M353 Polymyalgia rheumatica: Secondary | ICD-10-CM | POA: Diagnosis not present

## 2023-01-04 DIAGNOSIS — C3492 Malignant neoplasm of unspecified part of left bronchus or lung: Secondary | ICD-10-CM | POA: Diagnosis present

## 2023-01-04 DIAGNOSIS — Z7984 Long term (current) use of oral hypoglycemic drugs: Secondary | ICD-10-CM | POA: Insufficient documentation

## 2023-01-04 DIAGNOSIS — R404 Transient alteration of awareness: Secondary | ICD-10-CM | POA: Diagnosis not present

## 2023-01-04 DIAGNOSIS — E1159 Type 2 diabetes mellitus with other circulatory complications: Secondary | ICD-10-CM | POA: Diagnosis present

## 2023-01-04 DIAGNOSIS — Z8673 Personal history of transient ischemic attack (TIA), and cerebral infarction without residual deficits: Secondary | ICD-10-CM | POA: Diagnosis present

## 2023-01-04 DIAGNOSIS — Z9849 Cataract extraction status, unspecified eye: Secondary | ICD-10-CM | POA: Diagnosis not present

## 2023-01-04 DIAGNOSIS — I639 Cerebral infarction, unspecified: Principal | ICD-10-CM | POA: Insufficient documentation

## 2023-01-04 DIAGNOSIS — E119 Type 2 diabetes mellitus without complications: Secondary | ICD-10-CM

## 2023-01-04 DIAGNOSIS — I152 Hypertension secondary to endocrine disorders: Secondary | ICD-10-CM | POA: Diagnosis not present

## 2023-01-04 DIAGNOSIS — E1169 Type 2 diabetes mellitus with other specified complication: Secondary | ICD-10-CM | POA: Diagnosis present

## 2023-01-04 DIAGNOSIS — H3530 Unspecified macular degeneration: Secondary | ICD-10-CM | POA: Diagnosis not present

## 2023-01-04 DIAGNOSIS — Z743 Need for continuous supervision: Secondary | ICD-10-CM | POA: Diagnosis not present

## 2023-01-04 DIAGNOSIS — I6521 Occlusion and stenosis of right carotid artery: Secondary | ICD-10-CM | POA: Diagnosis not present

## 2023-01-04 DIAGNOSIS — E039 Hypothyroidism, unspecified: Secondary | ICD-10-CM | POA: Insufficient documentation

## 2023-01-04 DIAGNOSIS — Z1152 Encounter for screening for COVID-19: Secondary | ICD-10-CM | POA: Diagnosis not present

## 2023-01-04 DIAGNOSIS — Z6839 Body mass index (BMI) 39.0-39.9, adult: Secondary | ICD-10-CM | POA: Insufficient documentation

## 2023-01-04 DIAGNOSIS — C801 Malignant (primary) neoplasm, unspecified: Secondary | ICD-10-CM | POA: Diagnosis not present

## 2023-01-04 DIAGNOSIS — Z87891 Personal history of nicotine dependence: Secondary | ICD-10-CM | POA: Insufficient documentation

## 2023-01-04 DIAGNOSIS — E785 Hyperlipidemia, unspecified: Secondary | ICD-10-CM | POA: Diagnosis not present

## 2023-01-04 DIAGNOSIS — Z91040 Latex allergy status: Secondary | ICD-10-CM | POA: Insufficient documentation

## 2023-01-04 DIAGNOSIS — E1140 Type 2 diabetes mellitus with diabetic neuropathy, unspecified: Secondary | ICD-10-CM | POA: Diagnosis not present

## 2023-01-04 DIAGNOSIS — R059 Cough, unspecified: Secondary | ICD-10-CM | POA: Diagnosis not present

## 2023-01-04 DIAGNOSIS — R61 Generalized hyperhidrosis: Secondary | ICD-10-CM | POA: Diagnosis not present

## 2023-01-04 DIAGNOSIS — G63 Polyneuropathy in diseases classified elsewhere: Secondary | ICD-10-CM

## 2023-01-04 DIAGNOSIS — R531 Weakness: Secondary | ICD-10-CM | POA: Diagnosis not present

## 2023-01-04 DIAGNOSIS — R4701 Aphasia: Secondary | ICD-10-CM | POA: Diagnosis present

## 2023-01-04 LAB — RAPID URINE DRUG SCREEN, HOSP PERFORMED
Amphetamines: NOT DETECTED
Barbiturates: NOT DETECTED
Benzodiazepines: NOT DETECTED
Cocaine: NOT DETECTED
Opiates: NOT DETECTED
Tetrahydrocannabinol: NOT DETECTED

## 2023-01-04 LAB — COMPREHENSIVE METABOLIC PANEL
ALT: 12 U/L (ref 0–44)
AST: 15 U/L (ref 15–41)
Albumin: 3.8 g/dL (ref 3.5–5.0)
Alkaline Phosphatase: 46 U/L (ref 38–126)
Anion gap: 9 (ref 5–15)
BUN: 18 mg/dL (ref 8–23)
CO2: 28 mmol/L (ref 22–32)
Calcium: 9.3 mg/dL (ref 8.9–10.3)
Chloride: 100 mmol/L (ref 98–111)
Creatinine, Ser: 1.21 mg/dL (ref 0.61–1.24)
GFR, Estimated: 59 mL/min — ABNORMAL LOW (ref >60)
Glucose, Bld: 108 mg/dL — ABNORMAL HIGH (ref 70–99)
Potassium: 4 mmol/L (ref 3.5–5.1)
Sodium: 137 mmol/L (ref 135–145)
Total Bilirubin: 0.5 mg/dL (ref 0.3–1.2)
Total Protein: 6.6 g/dL (ref 6.5–8.1)

## 2023-01-04 LAB — RESP PANEL BY RT-PCR (RSV, FLU A&B, COVID)  RVPGX2
Influenza A by PCR: NEGATIVE
Influenza B by PCR: NEGATIVE
Resp Syncytial Virus by PCR: NEGATIVE
SARS Coronavirus 2 by RT PCR: NEGATIVE

## 2023-01-04 LAB — CBG MONITORING, ED: Glucose-Capillary: 198 mg/dL — ABNORMAL HIGH (ref 70–99)

## 2023-01-04 LAB — URINALYSIS, ROUTINE W REFLEX MICROSCOPIC
Bacteria, UA: NONE SEEN
Bilirubin Urine: NEGATIVE
Glucose, UA: NEGATIVE mg/dL
Hgb urine dipstick: NEGATIVE
Ketones, ur: NEGATIVE mg/dL
Nitrite: NEGATIVE
Protein, ur: NEGATIVE mg/dL
Specific Gravity, Urine: 1.006 (ref 1.005–1.030)
pH: 6 (ref 5.0–8.0)

## 2023-01-04 LAB — DIFFERENTIAL
Abs Immature Granulocytes: 0.05 10*3/uL (ref 0.00–0.07)
Basophils Absolute: 0 10*3/uL (ref 0.0–0.1)
Basophils Relative: 0 %
Eosinophils Absolute: 0.1 10*3/uL (ref 0.0–0.5)
Eosinophils Relative: 1 %
Immature Granulocytes: 1 %
Lymphocytes Relative: 13 %
Lymphs Abs: 1 10*3/uL (ref 0.7–4.0)
Monocytes Absolute: 0.7 10*3/uL (ref 0.1–1.0)
Monocytes Relative: 9 %
Neutro Abs: 5.3 10*3/uL (ref 1.7–7.7)
Neutrophils Relative %: 76 %

## 2023-01-04 LAB — CBC
HCT: 35.8 % — ABNORMAL LOW (ref 39.0–52.0)
Hemoglobin: 11.7 g/dL — ABNORMAL LOW (ref 13.0–17.0)
MCH: 31.5 pg (ref 26.0–34.0)
MCHC: 32.7 g/dL (ref 30.0–36.0)
MCV: 96.2 fL (ref 80.0–100.0)
Platelets: 220 10*3/uL (ref 150–400)
RBC: 3.72 MIL/uL — ABNORMAL LOW (ref 4.22–5.81)
RDW: 12.2 % (ref 11.5–15.5)
WBC: 7.1 10*3/uL (ref 4.0–10.5)
nRBC: 0 % (ref 0.0–0.2)

## 2023-01-04 LAB — I-STAT CHEM 8, ED
BUN: 23 mg/dL (ref 8–23)
Calcium, Ion: 1.2 mmol/L (ref 1.15–1.40)
Chloride: 99 mmol/L (ref 98–111)
Creatinine, Ser: 1.4 mg/dL — ABNORMAL HIGH (ref 0.61–1.24)
Glucose, Bld: 105 mg/dL — ABNORMAL HIGH (ref 70–99)
HCT: 36 % — ABNORMAL LOW (ref 39.0–52.0)
Hemoglobin: 12.2 g/dL — ABNORMAL LOW (ref 13.0–17.0)
Potassium: 4.1 mmol/L (ref 3.5–5.1)
Sodium: 137 mmol/L (ref 135–145)
TCO2: 28 mmol/L (ref 22–32)

## 2023-01-04 LAB — ETHANOL: Alcohol, Ethyl (B): <10 mg/dL (ref <10)

## 2023-01-04 LAB — PROTIME-INR
INR: 1 (ref 0.8–1.2)
Prothrombin Time: 12.8 seconds (ref 11.4–15.2)

## 2023-01-04 LAB — APTT: aPTT: 32 seconds (ref 24–36)

## 2023-01-04 MED ORDER — SENNOSIDES-DOCUSATE SODIUM 8.6-50 MG PO TABS: 1.0000 | ORAL_TABLET | Freq: Every evening | ORAL | Status: DC | PRN

## 2023-01-04 MED ORDER — GADOBUTROL 1 MMOL/ML IV SOLN
10.0000 mL | Freq: Once | INTRAVENOUS | Status: AC | PRN
  Administered 2023-01-04: 10 mL via INTRAVENOUS

## 2023-01-04 MED ORDER — INSULIN ASPART 100 UNIT/ML IJ SOLN
0.0000 [IU] | Freq: Three times a day (TID) | INTRAMUSCULAR | Status: DC
  Administered 2023-01-05 – 2023-01-06 (×5): 2 [IU] via SUBCUTANEOUS

## 2023-01-04 MED ORDER — IPRATROPIUM-ALBUTEROL 0.5-2.5 (3) MG/3ML IN SOLN
3.0000 mL | Freq: Once | RESPIRATORY_TRACT | Status: AC
  Administered 2023-01-04: 3 mL via RESPIRATORY_TRACT
  Filled 2023-01-04: qty 3

## 2023-01-04 MED ORDER — ACETAMINOPHEN 160 MG/5ML PO SOLN: 650.0000 mg | ORAL | Status: DC | PRN

## 2023-01-04 MED ORDER — PREDNISONE 5 MG PO TABS
5.0000 mg | ORAL_TABLET | Freq: Every day | ORAL | Status: DC
  Administered 2023-01-05 – 2023-01-06 (×2): 5 mg via ORAL
  Filled 2023-01-04 (×2): qty 1

## 2023-01-04 MED ORDER — KETOCONAZOLE 2 % EX CREA: TOPICAL_CREAM | Freq: Every day | CUTANEOUS | 0 refills | Status: DC

## 2023-01-04 MED ORDER — IPRATROPIUM-ALBUTEROL 0.5-2.5 (3) MG/3ML IN SOLN
3.0000 mL | Freq: Four times a day (QID) | RESPIRATORY_TRACT | Status: AC | PRN
  Administered 2023-01-05 – 2023-01-06 (×2): 3 mL via RESPIRATORY_TRACT
  Filled 2023-01-04 (×2): qty 3

## 2023-01-04 MED ORDER — ACETAMINOPHEN 650 MG RE SUPP: 650.0000 mg | RECTAL | Status: DC | PRN

## 2023-01-04 MED ORDER — ASPIRIN 325 MG PO TABS
325.0000 mg | ORAL_TABLET | Freq: Every day | ORAL | Status: DC
  Administered 2023-01-04: 325 mg via ORAL
  Filled 2023-01-04: qty 1

## 2023-01-04 MED ORDER — TAMSULOSIN HCL 0.4 MG PO CAPS: 0.4000 mg | ORAL_CAPSULE | Freq: Every day | ORAL | Status: DC

## 2023-01-04 MED ORDER — ACETAMINOPHEN 325 MG PO TABS: 650.0000 mg | ORAL_TABLET | ORAL | Status: DC | PRN

## 2023-01-04 MED ORDER — ENOXAPARIN SODIUM 40 MG/0.4ML IJ SOSY
40.0000 mg | PREFILLED_SYRINGE | INTRAMUSCULAR | Status: DC
  Administered 2023-01-04 – 2023-01-05 (×2): 40 mg via SUBCUTANEOUS
  Filled 2023-01-04 (×2): qty 0.4

## 2023-01-04 MED ORDER — LEVOTHYROXINE SODIUM 50 MCG PO TABS
50.0000 ug | ORAL_TABLET | Freq: Every day | ORAL | Status: DC
  Administered 2023-01-05 – 2023-01-06 (×2): 50 ug via ORAL
  Filled 2023-01-04 (×2): qty 1

## 2023-01-04 MED ORDER — CLOPIDOGREL BISULFATE 75 MG PO TABS
75.0000 mg | ORAL_TABLET | Freq: Every day | ORAL | Status: DC
  Administered 2023-01-04 – 2023-01-06 (×3): 75 mg via ORAL
  Filled 2023-01-04 (×3): qty 1

## 2023-01-04 MED ORDER — TAMSULOSIN HCL 0.4 MG PO CAPS
0.4000 mg | ORAL_CAPSULE | Freq: Every day | ORAL | Status: DC
  Administered 2023-01-04 – 2023-01-05 (×2): 0.4 mg via ORAL
  Filled 2023-01-04 (×2): qty 1

## 2023-01-04 MED ORDER — STROKE: EARLY STAGES OF RECOVERY BOOK
Freq: Once | Status: AC
  Filled 2023-01-04: qty 1

## 2023-01-04 MED ORDER — ASPIRIN 300 MG RE SUPP: 300.0000 mg | Freq: Every day | RECTAL | Status: DC

## 2023-01-04 NOTE — ED Notes (Signed)
Patient transported to CT 

## 2023-01-04 NOTE — ED Notes (Signed)
ED TO INPATIENT HANDOFF REPORT  ED Nurse Name and Phone #: Jodie Echevaria 52  S Name/Age/Gender Alex Edwards 87 y.o. male Room/Bed: 046C/046C  Code Status   Code Status: Full Code  Home/SNF/Other Home Patient oriented to: self, place, time, and situation Is this baseline? Yes   Triage Complete: Triage complete  Chief Complaint TIA (transient ischemic attack) [G45.9]  Triage Note Pt BIBEMS from home w cc of period left arm weakness starting on Sunday, along with voice changes/slurred speech and worsening chronic cough. Pt states he has stage 4 lung cancer which he has a chronic cough from and has been coughing up sputum over the last 2 days. Pt states he feels normal at this time and is at his baseline but his family wanted him to come in for further eval due to reccuring symptoms. VSS, pt not complaining of any weakness in arm at this time or vision changes. Pt hooked up to monitor. IV in place started by EMS. Pt a&ox4. Pt denies any history of stroke and does not take a blood thinner.    Allergies Allergies  Allergen Reactions   Paclitaxel Shortness Of Breath    SOB and light headedness, flushing and 8/10 pain in left flank Pepcid and Solumedrol administered.  Able to tolerate remainder of Taxol infusion   Vancomycin     Red Man Syndrome    Amlodipine Swelling   Latex Other (See Comments)    "possibly allergic- dry, itching, rash"    Level of Care/Admitting Diagnosis ED Disposition     ED Disposition  Admit   Condition  --   Comment  Hospital Area: [facility] [[id number]]  Level of Care: Telemetry Medical [104]  May place patient in observation at [facility] or [facility] if equivalent level of care is available:: No  Covid Evaluation: Asymptomatic - no recent exposure (last 10 days) testing not required  Diagnosis: TIA (transient ischemic attack) [010272]  Admitting Physician: Synetta Fail [[id number]]  Attending Physician: Synetta Fail  [[id number]]          B Medical/Surgery History Past Medical History:  Diagnosis Date   BCE (basal cell epithelioma)    Bruises easily    on hands   Cough    smokers cough or perfurmes   Degeneration macular    right eye   Diabetes mellitus without complication (HCC)    Dyslipidemia    ED (erectile dysfunction)    Hx of adenomatous colonic polyps    Hyperlipidemia    Hypertension    borderline   Obesity    Psoriasis    Seasonal allergies    Smoker    former   Past Surgical History:  Procedure Laterality Date   BRONCHIAL BRUSHINGS  11/18/2020   Procedure: BRONCHIAL BRUSHINGS;  Surgeon: Charlott Holler, MD;  Location: [facility];  Service: Pulmonary;;   BRONCHIAL NEEDLE ASPIRATION BIOPSY  11/18/2020   Procedure: BRONCHIAL NEEDLE ASPIRATION BIOPSIES;  Surgeon: Charlott Holler, MD;  Location: [facility];  Service: Pulmonary;;   BRONCHIAL WASHINGS  11/18/2020   Procedure: BRONCHIAL WASHINGS;  Surgeon: Charlott Holler, MD;  Location: [facility];  Service: Pulmonary;;   CATARACT EXTRACTION, BILATERAL Bilateral 2013   COLONOSCOPY  2005   [facility]   ENDOBRONCHIAL ULTRASOUND N/A 11/18/2020   Procedure: ENDOBRONCHIAL ULTRASOUND;  Surgeon: Charlott Holler, MD;  Location: [facility];  Service: Pulmonary;  Laterality: N/A;   POLYPECTOMY     SKIN CANCER EXCISION Right 1993  under eye-MOHS, freeze multiple places freq   TONSILLECTOMY  as child   TOTAL HIP ARTHROPLASTY Left 01/04/2014   Procedure: LEFT TOTAL HIP ARTHROPLASTY ANTERIOR APPROACH;  Surgeon: Kathryne Hitch, MD;  Location: [facility];  Service: Orthopedics;  Laterality: Left;   TOTAL HIP ARTHROPLASTY Left 02/14/2014   Procedure: Irrigation and Debridement left hip;  Surgeon: Kathryne Hitch, MD;  Location: [facility];  Service: Orthopedics;  Laterality: Left;     A IV Location/Drains/Wounds Patient Lines/Drains/Airways Status     Active Line/Drains/Airways     Name Placement date Placement time Site Days    Peripheral IV 01/04/23 18 G Left Antecubital 01/04/23  1310  Antecubital  less than 1   External Urinary Catheter 11/18/20  1900  --  777            Intake/Output Last 24 hours No intake or output data in the 24 hours ending 01/04/23 2239  Labs/Imaging Results for orders placed or performed during the hospital encounter of 01/04/23 (from the past 48 hour(s))  Urine rapid drug screen (hosp performed)     Status: None   Collection Time: 01/04/23  2:23 PM  Result Value Ref Range   Opiates NONE DETECTED NONE DETECTED   Cocaine NONE DETECTED NONE DETECTED   Benzodiazepines NONE DETECTED NONE DETECTED   Amphetamines NONE DETECTED NONE DETECTED   Tetrahydrocannabinol NONE DETECTED NONE DETECTED   Barbiturates NONE DETECTED NONE DETECTED    Comment: (NOTE) DRUG SCREEN FOR MEDICAL PURPOSES ONLY.  IF CONFIRMATION IS NEEDED FOR ANY PURPOSE, NOTIFY LAB WITHIN 5 DAYS.  LOWEST DETECTABLE LIMITS FOR URINE DRUG SCREEN Drug Class                     Cutoff (ng/mL) Amphetamine and metabolites    1000 Barbiturate and metabolites    200 Benzodiazepine                 200 Opiates and metabolites        300 Cocaine and metabolites        300 THC                            50 Performed at [facility], [address] [place], [place] [address]   Urinalysis, Routine w reflex microscopic -Urine, Clean Catch     Status: Abnormal   Collection Time: 01/04/23  2:23 PM  Result Value Ref Range   Color, Urine STRAW (A) YELLOW   APPearance CLEAR CLEAR   Specific Gravity, Urine 1.006 1.005 - 1.030   pH 6.0 5.0 - 8.0   Glucose, UA NEGATIVE NEGATIVE mg/dL   Hgb urine dipstick NEGATIVE NEGATIVE   Bilirubin Urine NEGATIVE NEGATIVE   Ketones, ur NEGATIVE NEGATIVE mg/dL   Protein, ur NEGATIVE NEGATIVE mg/dL   Nitrite NEGATIVE NEGATIVE   Leukocytes,Ua TRACE (A) NEGATIVE   RBC / HPF 0-5 0 - 5 RBC/hpf   WBC, UA 0-5 0 - 5 WBC/hpf   Bacteria, UA NONE SEEN NONE SEEN   Squamous Epithelial / HPF 0-5  0 - 5 /HPF   Mucus PRESENT     Comment: Performed at [facility], [address] [place], [place] [address]  Resp panel by RT-PCR (RSV, Flu A&B, Covid) Anterior Nasal Swab     Status: None   Collection Time: 01/04/23  2:23 PM   Specimen: Anterior Nasal Swab  Result Value Ref Range  SARS Coronavirus 2 by RT PCR NEGATIVE NEGATIVE   Influenza A by PCR NEGATIVE NEGATIVE   Influenza B by PCR NEGATIVE NEGATIVE    Comment: (NOTE) The Xpert Xpress SARS-CoV-2/FLU/RSV plus assay is intended as an aid in the diagnosis of influenza from Nasopharyngeal swab specimens and should not be used as a sole basis for treatment. Nasal washings and aspirates are unacceptable for Xpert Xpress SARS-CoV-2/FLU/RSV testing.  Fact Sheet for Patients: BloggerCourse.com  Fact Sheet for Healthcare Providers: SeriousBroker.it  This test is not yet approved or cleared by the [place] FDA and has been authorized for detection and/or diagnosis of SARS-CoV-2 by FDA under an Emergency Use Authorization (EUA). This EUA will remain in effect (meaning this test can be used) for the duration of the COVID-19 declaration under Section 564(b)(1) of the Act, 21 U.S.C. section 360bbb-3(b)(1), unless the authorization is terminated or revoked.     Resp Syncytial Virus by PCR NEGATIVE NEGATIVE    Comment: (NOTE) Fact Sheet for Patients: BloggerCourse.com  Fact Sheet for Healthcare Providers: SeriousBroker.it  This test is not yet approved or cleared by the [place] FDA and has been authorized for detection and/or diagnosis of SARS-CoV-2 by FDA under an Emergency Use Authorization (EUA). This EUA will remain in effect (meaning this test can be used) for the duration of the COVID-19 declaration under Section 564(b)(1) of the Act, 21 U.S.C. section 360bbb-3(b)(1), unless the authorization is terminated  or revoked.  Performed at [facility], [address] [place], [place] [address]   Ethanol     Status: None   Collection Time: 01/04/23  2:35 PM  Result Value Ref Range   Alcohol, Ethyl (B) <10 <10 mg/dL    Comment: (NOTE) Lowest detectable limit for serum alcohol is 10 mg/dL.  For medical purposes only. Performed at [facility], [address], [place], [place] [address]   Protime-INR     Status: None   Collection Time: 01/04/23  2:35 PM  Result Value Ref Range   Prothrombin Time 12.8 11.4 - 15.2 seconds   INR 1.0 0.8 - 1.2    Comment: (NOTE) INR goal varies based on device and disease states. Performed at [facility], [address], [place], [place] [address]   APTT     Status: None   Collection Time: 01/04/23  2:35 PM  Result Value Ref Range   aPTT 32 24 - 36 seconds    Comment: Performed at [facility], [address] [place], [place] [address]  CBC     Status: Abnormal   Collection Time: 01/04/23  2:35 PM  Result Value Ref Range   WBC 7.1 4.0 - 10.5 K/uL   RBC 3.72 (L) 4.22 - 5.81 MIL/uL   Hemoglobin 11.7 (L) 13.0 - 17.0 g/dL   HCT 29.5 (L) 62.1 - 30.8 %   MCV 96.2 80.0 - 100.0 fL   MCH 31.5 26.0 - 34.0 pg   MCHC 32.7 30.0 - 36.0 g/dL   RDW 65.7 84.6 - 96.2 %   Platelets 220 150 - 400 K/uL   nRBC 0.0 0.0 - 0.2 %    Comment: Performed at [facility], [address], [place], [place] [address]  Differential     Status: None   Collection Time: 01/04/23  2:35 PM  Result Value Ref Range   Neutrophils Relative % 76 %   Neutro Abs 5.3 1.7 - 7.7 K/uL   Lymphocytes Relative 13 %  Lymphs Abs 1.0 0.7 - 4.0 K/uL   Monocytes Relative 9 %   Monocytes Absolute 0.7 0.1 - 1.0 K/uL   Eosinophils Relative 1 %   Eosinophils Absolute 0.1 0.0 - 0.5 K/uL   Basophils Relative 0 %   Basophils Absolute 0.0 0.0 - 0.1 K/uL   Immature Granulocytes 1 %   Abs Immature Granulocytes 0.05 0.00 - 0.07 K/uL    Comment: Performed at [facility], [address], [place], [place] [address]  Comprehensive metabolic panel     Status: Abnormal   Collection Time: 01/04/23  2:35 PM  Result Value Ref Range   Sodium 137 135 - 145 mmol/L   Potassium 4.0 3.5 - 5.1 mmol/L   Chloride 100 98 - 111 mmol/L   CO2 28 22 - 32 mmol/L   Glucose, Bld 108 (H) 70 - 99 mg/dL    Comment: Glucose reference range applies only to samples taken after fasting for at least 8 hours.   BUN 18 8 - 23 mg/dL   Creatinine, Ser 6.04 0.61 - 1.24 mg/dL   Calcium 9.3 8.9 - 54.0 mg/dL   Total Protein 6.6 6.5 - 8.1 g/dL   Albumin 3.8 3.5 - 5.0 g/dL   AST 15 15 - 41 U/L   ALT 12 0 - 44 U/L   Alkaline Phosphatase 46 38 - 126 U/L   Total Bilirubin 0.5 0.3 - 1.2 mg/dL   GFR, Estimated 59 (L) >60 mL/min    Comment: (NOTE) Calculated using the CKD-EPI Creatinine Equation (2021)    Anion gap 9 5 - 15    Comment: Performed at [facility], [address], [place], [place] [address]  I-stat chem 8, ED     Status: Abnormal   Collection Time: 01/04/23  3:02 PM  Result Value Ref Range   Sodium 137 135 - 145 mmol/L   Potassium 4.1 3.5 - 5.1 mmol/L   Chloride 99 98 - 111 mmol/L   BUN 23 8 - 23 mg/dL   Creatinine, Ser 1.47 (H) 0.61 - 1.24 mg/dL   Glucose, Bld 829 (H) 70 - 99 mg/dL    Comment: Glucose reference range applies only to samples taken after fasting for at least 8 hours.   Calcium, Ion 1.20 1.15 - 1.40 mmol/L   TCO2 28 22 - 32 mmol/L   Hemoglobin 12.2 (L) 13.0 - 17.0 g/dL   HCT 56.2 (L) 13.0 - 86.5 %  CBG monitoring, ED     Status: Abnormal   Collection Time: 01/04/23 10:35 PM  Result Value Ref Range   Glucose-Capillary 198 (H) 70 - 99 mg/dL    Comment: Glucose reference range applies only to samples taken after fasting for at least 8 hours.   Comment 1 Notify RN    CT Head Wo Contrast  Result Date: 01/04/2023 CLINICAL DATA:  Transient ischemic attack (TIA) EXAM: CT HEAD WITHOUT CONTRAST TECHNIQUE: Contiguous axial images were obtained from the  base of the skull through the vertex without intravenous contrast. RADIATION DOSE REDUCTION: This exam was performed according to the departmental dose-optimization program which includes automated exposure control, adjustment of the mA and/or kV according to patient size and/or use of iterative reconstruction technique. COMPARISON:  None Available. FINDINGS: Brain: Scratch no intracranial hemorrhage, mass effect, or midline shift. Generalized atrophy, not unexpected for age. No hydrocephalus. The basilar cisterns are patent. No evidence of territorial infarct or acute ischemia. No extra-axial or intracranial fluid collection. Vascular: Atherosclerosis of skullbase  vasculature without hyperdense vessel or abnormal calcification. Skull: No fracture or focal lesion. Sinuses/Orbits: Paranasal sinuses and mastoid air cells are clear. The visualized orbits are unremarkable. Bilateral cataract resection. Other: None. IMPRESSION: 1. No acute intracranial abnormality. 2. Generalized atrophy, not unexpected for age. Electronically Signed   By: Narda Rutherford M.D.   On: 01/04/2023 16:29   DG Chest Port 1 View  Result Date: 01/04/2023 CLINICAL DATA:  Cough, shortness of breath. EXAM: PORTABLE CHEST 1 VIEW COMPARISON:  September 21, 2020. FINDINGS: The heart size and mediastinal contours are within normal limits. Postsurgical changes are seen in the left mid lung consistent with prior tumor resection. Bibasilar atelectasis or scarring is noted. The visualized skeletal structures are unremarkable. IMPRESSION: Postsurgical changes seen in left mid lung. Bibasilar subsegmental atelectasis or scarring. Electronically Signed   By: Lupita Raider M.D.   On: 01/04/2023 15:34    Pending Labs Unresulted Labs (From admission, onward)     Start     Ordered   01/11/23 0500  Creatinine, serum  (enoxaparin (LOVENOX)    CrCl >/= 30 ml/min)  Weekly,   R     Comments: while on enoxaparin therapy    01/04/23 1714   01/05/23 0500   Lipid panel  (Labs)  Tomorrow morning,   R       Comments: Fasting    01/04/23 1714   01/05/23 0500  Hemoglobin A1c  (Labs)  Tomorrow morning,   R       Comments: To assess prior glycemic control    01/04/23 1714            Vitals/Pain Today's Vitals   01/04/23 1815 01/04/23 1830 01/04/23 1905 01/04/23 2000  BP: (!) 142/71  (!) 145/76 137/72  Pulse: 100 75 82 79  Resp: (!) 30 20 18  (!) 21  Temp:   97.9 F (36.6 C)   TempSrc:   Oral   SpO2: 94% 99% 100% 99%  Weight:      Height:      PainSc:        Isolation Precautions No active isolations  Medications Medications   stroke: early stages of recovery book (has no administration in time range)  acetaminophen (TYLENOL) tablet 650 mg (has no administration in time range)    Or  acetaminophen (TYLENOL) 160 MG/5ML solution 650 mg (has no administration in time range)    Or  acetaminophen (TYLENOL) suppository 650 mg (has no administration in time range)  senna-docusate (Senokot-S) tablet 1 tablet (has no administration in time range)  enoxaparin (LOVENOX) injection 40 mg (40 mg Subcutaneous Given 01/04/23 1948)  aspirin suppository 300 mg ( Rectal See Alternative 01/04/23 1948)    Or  aspirin tablet 325 mg (325 mg Oral Given 01/04/23 1948)  ipratropium-albuterol (DUONEB) 0.5-2.5 (3) MG/3ML nebulizer solution 3 mL (has no administration in time range)  predniSONE (DELTASONE) tablet 5 mg (has no administration in time range)  levothyroxine (SYNTHROID) tablet 50 mcg (has no administration in time range)  insulin aspart (novoLOG) injection 0-15 Units (has no administration in time range)  clopidogrel (PLAVIX) tablet 75 mg (75 mg Oral Given 01/04/23 2054)  tamsulosin (FLOMAX) capsule 0.4 mg (0.4 mg Oral Given 01/04/23 2054)  ipratropium-albuterol (DUONEB) 0.5-2.5 (3) MG/3ML nebulizer solution 3 mL (3 mLs Nebulization Given 01/04/23 1616)  gadobutrol (GADAVIST) 1 MMOL/ML injection 10 mL (10 mLs Intravenous Contrast Given 01/04/23 2206)     Mobility walks     Focused Assessments Cardiac Assessment Handoff:  Cardiac  Rhythm: Normal sinus rhythm No results found for: "CKTOTAL", "CKMB", "CKMBINDEX", "TROPONINI" Lab Results  Component Value Date   DDIMER 5.77 (H) 04/04/2014   Does the Patient currently have chest pain? No   , Neuro Assessment Handoff:  Swallow screen pass? Yes  Cardiac Rhythm: Normal sinus rhythm NIH Stroke Scale  Dizziness Present: No Headache Present: No Interval: Initial Level of Consciousness (1a.)   : Alert, keenly responsive LOC Questions (1b. )   : Answers both questions correctly LOC Commands (1c. )   : Performs both tasks correctly Best Gaze (2. )  : Normal Visual (3. )  : No visual loss Facial Palsy (4. )    : Normal symmetrical movements Motor Arm, Left (5a. )   : No drift Motor Arm, Right (5b. ) : No drift Motor Leg, Left (6a. )  : No drift Motor Leg, Right (6b. ) : No drift Limb Ataxia (7. ): Absent Sensory (8. )  : Normal, no sensory loss Best Language (9. )  : No aphasia Dysarthria (10. ): Normal Last date known well: 01/03/23 Last time known well: 0800 Neuro Assessment: Within Defined Limits Neuro Checks:   Initial (01/04/23 1953)  Has TPA been given? No If patient is a Neuro Trauma and patient is going to OR before floor call report to 4N Charge nurse: [phone] or [phone]  , Pulmonary Assessment Handoff:  Lung sounds: Bilateral Breath Sounds: Clear O2 Device: Nasal Cannula O2 Flow Rate (L/min): 3 L/min    R Recommendations: See Admitting Provider Note  Report given to:   Additional Notes: n/a

## 2023-01-04 NOTE — Consult Note (Signed)
Neurology Consultation  Reason for Consult: Intermittent blurred vision and left-sided weakness Referring Physician: Arthor Captain, PA-C  CC: Intermittent blurred vision and left-sided weakness  History is obtained from: Patient, chart  HPI: Alex Edwards is a 87 y.o. male past medical history of metastatic non-small cell lung cancer, macular degeneration of the right eye, diabetes, dyslipidemia, hypertension, obesity, former smoker presented to the emergency room for evaluation of intermittent neurological symptoms since Sunday, 01/02/2023. He reports that he woke up on Sunday normal but at some point during the day had numbness of the left arm along with blurred vision.  He is blind in the right eye so he does not see much from there but his left eye vision was blurred.  This happened again yesterday for another hour.  Today he had the similar episode of left arm numbness and blurred vision that lasted for about 3 hours which prompted him to come to the emergency room.  By the time he arrived, his symptoms had resolved. He reports that he has increased phlegm production.  He has completed chemotherapy since 2022 for his lung cancer.  No history of strokes in the past.   LKW: Intermittent symptoms-this morning woke up with symptoms so last known well was sometime last night-unclear IV thrombolysis given?: no, NIH 0 Premorbid modified Rankin scale (mRS): 2   ROS: Full ROS was performed and is negative except as noted in the HPI.   Past Medical History:  Diagnosis Date   BCE (basal cell epithelioma)    Bruises easily    on hands   Cough    smokers cough or perfurmes   Degeneration macular    right eye   Diabetes mellitus without complication (HCC)    Dyslipidemia    ED (erectile dysfunction)    Hx of adenomatous colonic polyps    Hyperlipidemia    Hypertension    borderline   Obesity    Psoriasis    Seasonal allergies    Smoker    former     Family History  Problem  Relation Age of Onset   Heart disease Brother      Social History:   reports that he quit smoking about 8 years ago. His smoking use included cigarettes. He has a 50.00 pack-year smoking history. He has never used smokeless tobacco. He reports that he does not drink alcohol and does not use drugs.  Medications  Current Facility-Administered Medications:    [START ON 01/05/2023]  stroke: early stages of recovery book, , Does not apply, Once, Synetta Fail, MD   acetaminophen (TYLENOL) tablet 650 mg, 650 mg, Oral, Q4H PRN **OR** acetaminophen (TYLENOL) 160 MG/5ML solution 650 mg, 650 mg, Per Tube, Q4H PRN **OR** acetaminophen (TYLENOL) suppository 650 mg, 650 mg, Rectal, Q4H PRN, Synetta Fail, MD   aspirin suppository 300 mg, 300 mg, Rectal, Daily **OR** aspirin tablet 325 mg, 325 mg, Oral, Daily, Synetta Fail, MD   enoxaparin (LOVENOX) injection 40 mg, 40 mg, Subcutaneous, Q24H, Synetta Fail, MD   [START ON 01/05/2023] insulin aspart (novoLOG) injection 0-15 Units, 0-15 Units, Subcutaneous, TID WC, Synetta Fail, MD   ipratropium-albuterol (DUONEB) 0.5-2.5 (3) MG/3ML nebulizer solution 3 mL, 3 mL, Nebulization, Q6H PRN, Synetta Fail, MD   [START ON 01/05/2023] levothyroxine (SYNTHROID) tablet 50 mcg, 50 mcg, Oral, QAC breakfast, Synetta Fail, MD   [START ON 01/05/2023] predniSONE (DELTASONE) tablet 5 mg, 5 mg, Oral, Q breakfast, Synetta Fail, MD  senna-docusate (Senokot-S) tablet 1 tablet, 1 tablet, Oral, QHS PRN, Synetta Fail, MD  Current Outpatient Medications:    Ascorbic Acid (VITAMIN C) 1000 MG tablet, Take 1,000 mg by mouth daily., Disp: , Rfl:    Cholecalciferol (VITAMIN D-3) 125 MCG (5000 UT) TABS, Take 1 tablet by mouth every other day., Disp: , Rfl:    hydrochlorothiazide (MICROZIDE) 12.5 MG capsule, TAKE ONE CAPSULE BY MOUTH DAILY, Disp: 90 capsule, Rfl: 0   ketoconazole (NIZORAL) 2 % cream, Apply topically daily., Disp: 60  g, Rfl: 0   levothyroxine (SYNTHROID) 50 MCG tablet, Take 1 tablet (50 mcg total) by mouth daily before breakfast., Disp: 30 tablet, Rfl: 2   loratadine (CLARITIN) 10 MG tablet, Take 10 mg by mouth daily., Disp: , Rfl:    losartan (COZAAR) 100 MG tablet, TAKE ONE TABLET BY MOUTH DAILY, Disp: 90 tablet, Rfl: 0   metFORMIN (GLUCOPHAGE-XR) 750 MG 24 hr tablet, Take 1 tablet (750 mg total) by mouth daily with breakfast., Disp: 30 tablet, Rfl: 5   metoprolol tartrate (LOPRESSOR) 50 MG tablet, TAKE ONE TABLET BY MOUTH TWICE DAILY, Disp: 180 tablet, Rfl: 0   predniSONE (DELTASONE) 5 MG tablet, Take 1 tablet (5 mg total) by mouth daily with breakfast., Disp: 90 tablet, Rfl: 1   tamsulosin (FLOMAX) 0.4 MG CAPS capsule, Take 1 capsule (0.4 mg total) by mouth daily after supper., Disp: 90 capsule, Rfl: 1   triamcinolone cream (KENALOG) 0.1 %, APPLY TO AFFECTED AREA TWICE A DAY., Disp: 454 g, Rfl: 0   ONETOUCH DELICA LANCETS FINE MISC, Patient is to test one time a day DX: E11.9, Disp: 100 each, Rfl: 4   ONETOUCH VERIO test strip, USE TO TEST BLOOD SUGAR ONCE DAILY., Disp: 50 strip, Rfl: 3   Exam: Current vital signs: BP (!) 145/76 (BP Location: Right Arm)   Pulse 82   Temp 97.9 F (36.6 C) (Oral)   Resp 18   Ht 6\' 1"  (1.854 m)   Wt 113.4 kg   SpO2 100%   BMI 32.98 kg/m  Vital signs in last 24 hours: Temp:  [97.9 F (36.6 C)-98.3 F (36.8 C)] 97.9 F (36.6 C) (07/09 1905) Pulse Rate:  [75-106] 82 (07/09 1905) Resp:  [17-35] 18 (07/09 1905) BP: (119-168)/(67-90) 145/76 (07/09 1905) SpO2:  [94 %-100 %] 100 % (07/09 1905) Weight:  [113.4 kg] 113.4 kg (07/09 1403) General: Awake alert in no distress HEENT: Normocephalic atraumatic Lungs: Distant breath sounds with scattered rales Abdomen nondistended nontender Cardiovascular: Regular rhythm Neurological exam Awake alert oriented x 3 Voice is a little hoarse but not dysarthric No evidence of aphasia Cranial nerves II to XII intact Motor  examination with no drift in any of the 4 extremities-the caveat is that he has had surgery in the left hip and has some weakness on that leg at baseline but was able to lift it against gravity without drift for 5 seconds. Sensation diminished in a stocking type pattern up to mid calf with no focality. Coordination exam with no dysmetria NIH stroke scale-0    Labs I have reviewed labs in epic and the results pertinent to this consultation are:  CBC    Component Value Date/Time   WBC 7.1 01/04/2023 1435   RBC 3.72 (L) 01/04/2023 1435   HGB 12.2 (L) 01/04/2023 1502   HGB 12.7 (L) 07/01/2022 1114   HGB 13.8 01/29/2020 1157   HCT 36.0 (L) 01/04/2023 1502   HCT 42.1 01/29/2020 1157   PLT 220  01/04/2023 1435   PLT 219 07/01/2022 1114   PLT 286 01/29/2020 1157   MCV 96.2 01/04/2023 1435   MCV 94 01/29/2020 1157   MCH 31.5 01/04/2023 1435   MCHC 32.7 01/04/2023 1435   RDW 12.2 01/04/2023 1435   RDW 12.1 01/29/2020 1157   LYMPHSABS 1.0 01/04/2023 1435   LYMPHSABS 2.2 01/29/2020 1157   MONOABS 0.7 01/04/2023 1435   EOSABS 0.1 01/04/2023 1435   EOSABS 0.2 01/29/2020 1157   BASOSABS 0.0 01/04/2023 1435   BASOSABS 0.0 01/29/2020 1157    CMP     Component Value Date/Time   NA 137 01/04/2023 1502   NA 141 01/29/2020 1157   K 4.1 01/04/2023 1502   CL 99 01/04/2023 1502   CO2 28 01/04/2023 1435   GLUCOSE 105 (H) 01/04/2023 1502   BUN 23 01/04/2023 1502   BUN 12 01/29/2020 1157   CREATININE 1.40 (H) 01/04/2023 1502   CREATININE 1.16 07/01/2022 1114   CREATININE 1.11 07/26/2016 0921   CALCIUM 9.3 01/04/2023 1435   PROT 6.6 01/04/2023 1435   PROT 6.6 01/29/2020 1157   ALBUMIN 3.8 01/04/2023 1435   ALBUMIN 4.2 01/29/2020 1157   AST 15 01/04/2023 1435   AST 13 (L) 07/01/2022 1114   ALT 12 01/04/2023 1435   ALT 11 07/01/2022 1114   ALKPHOS 46 01/04/2023 1435   BILITOT 0.5 01/04/2023 1435   BILITOT 0.5 07/01/2022 1114   GFRNONAA 59 (L) 01/04/2023 1435   GFRNONAA >60  07/01/2022 1114   GFRAA 84 01/29/2020 1157    Lipid Panel     Component Value Date/Time   CHOL 207 (H) 08/25/2022 1212   TRIG 233 (H) 08/25/2022 1212   HDL 37 (L) 08/25/2022 1212   CHOLHDL 5.6 (H) 08/25/2022 1212   CHOLHDL 5.8 (H) 07/26/2016 0921   VLDL 46 (H) 07/26/2016 0921   LDLCALC 128 (H) 08/25/2022 1212    Lab Results  Component Value Date   HGBA1C 7.8 (A) 08/25/2022      Imaging I have reviewed the images obtained:  CT-head-NA ICP  Assessment:  87 year old with above past medical history presenting for intermittent episodes of blurred vision and left arm numbness lasting anywhere from 1 to 3 hours over the last 3 days.  He has had 3 such episodes. Given his history of lung cancer, advanced age and other risk factors-stroke/TIA versus Brain metastases are on the differentials.  Recommendations: Admit to hospitalist MRI brain with and without contrast CT angiography head and neck 2D echo A1c Lipid panel Frequent neurochecks Telemetry PT OT speech therapy On no antiplatelets at home.  Will recommend dual antiplatelets-duration to be determined after testing results become available. Permissive hypertension-treat only if systolic blood pressures greater than 220 on a as needed basis for the next 1 to 2 days. Stroke team to follow Plan relayed to Dr. Alinda Money - admitting hospitalist    -- Milon Dikes, MD Neurologist [facility] Pager: [phone]

## 2023-01-04 NOTE — ED Notes (Signed)
Patient transported to MRI 

## 2023-01-04 NOTE — Telephone Encounter (Signed)
Pt needed a refill on this

## 2023-01-04 NOTE — ED Triage Notes (Signed)
Pt BIBEMS from home w cc of period left arm weakness starting on Sunday, along with voice changes/slurred speech and worsening chronic cough. Pt states he has stage 4 lung cancer which he has a chronic cough from and has been coughing up sputum over the last 2 days. Pt states he feels normal at this time and is at his baseline but his family wanted him to come in for further eval due to reccuring symptoms. VSS, pt not complaining of any weakness in arm at this time or vision changes. Pt hooked up to monitor. IV in place started by EMS. Pt a&ox4. Pt denies any history of stroke and does not take a blood thinner.

## 2023-01-04 NOTE — H&P (Signed)
History and Physical   Alex Edwards ZOX:[id number] DOB: 1936/05/09 DOA: 01/04/2023  PCP: Ronnald Nian, MD   Patient coming from: Home  Chief Complaint: Left arm weakness, visual changes, slurred speech  HPI: Alex Edwards is a 87 y.o. male with medical history significant of carotid artery disease, hypertension, hyperlipidemia, diabetes, hypothyroidism, lung cancer, neuropathy, macular degeneration, cataracts status post surgical intervention presenting with focal neurologic deficits.  History obtained with assistance of chart review and family.  Patient reportedly had intermittent episodes of left arm weakness, slurred speech, blurry vision for the past 2 days.  Woke up with symptoms today that lasted 3 hours.  Also has had some increase in his chronic cough with sputum production for the last couple of days this is in the setting of chronic chronic from lung cancer.  Reports ongoing night sweats.  Denies fevers, chills, chest pain, shortness of breath, abdominal pain, constipation, diarrhea, nausea, vomiting.  ED Course: Vital signs in the ED notable for blood pressure in the 140s to 160s systolic.  Heart rate 70s to 100s.  Lab workup included CMP with glucose 108, CBC with hemoglobin stable 11.7.  PT, PTT, INR within normal limits.  Respiratory panel for flu COVID RSV negative.  UDS negative.  Urinalysis with trace leukocytes only.  Ethanol level negative.  Chest x-ray showed no acute abnormality showed postsurgical changes and basilar atelectasis versus scarring.  CT abdomen acute abnormality.  Neurology consulted and will see in the morning plan for TIA workup.  DuoNebs ordered in the ED.  Review of Systems: As per HPI otherwise all other systems reviewed and are negative.  Past Medical History:  Diagnosis Date   BCE (basal cell epithelioma)    Bruises easily    on hands   Cough    smokers cough or perfurmes   Degeneration macular    right eye   Diabetes mellitus without  complication (HCC)    Dyslipidemia    ED (erectile dysfunction)    Hx of adenomatous colonic polyps    Hyperlipidemia    Hypertension    borderline   Obesity    Psoriasis    Seasonal allergies    Smoker    former    Past Surgical History:  Procedure Laterality Date   BRONCHIAL BRUSHINGS  11/18/2020   Procedure: BRONCHIAL BRUSHINGS;  Surgeon: Charlott Holler, MD;  Location: [facility];  Service: Pulmonary;;   BRONCHIAL NEEDLE ASPIRATION BIOPSY  11/18/2020   Procedure: BRONCHIAL NEEDLE ASPIRATION BIOPSIES;  Surgeon: Charlott Holler, MD;  Location: [facility];  Service: Pulmonary;;   BRONCHIAL WASHINGS  11/18/2020   Procedure: BRONCHIAL WASHINGS;  Surgeon: Charlott Holler, MD;  Location: [facility];  Service: Pulmonary;;   CATARACT EXTRACTION, BILATERAL Bilateral 2013   COLONOSCOPY  2005   [facility]   ENDOBRONCHIAL ULTRASOUND N/A 11/18/2020   Procedure: ENDOBRONCHIAL ULTRASOUND;  Surgeon: Charlott Holler, MD;  Location: [facility];  Service: Pulmonary;  Laterality: N/A;   POLYPECTOMY     SKIN CANCER EXCISION Right 1993   under eye-MOHS, freeze multiple places freq   TONSILLECTOMY  as child   TOTAL HIP ARTHROPLASTY Left 01/04/2014   Procedure: LEFT TOTAL HIP ARTHROPLASTY ANTERIOR APPROACH;  Surgeon: Kathryne Hitch, MD;  Location: [facility];  Service: Orthopedics;  Laterality: Left;   TOTAL HIP ARTHROPLASTY Left 02/14/2014   Procedure: Irrigation and Debridement left hip;  Surgeon: Kathryne Hitch, MD;  Location: [facility];  Service: Orthopedics;  Laterality: Left;  Social History  reports that he quit smoking about 8 years ago. His smoking use included cigarettes. He has a 50.00 pack-year smoking history. He has never used smokeless tobacco. He reports that he does not drink alcohol and does not use drugs.  Allergies  Allergen Reactions   Paclitaxel Shortness Of Breath    SOB and light headedness, flushing and 8/10 pain in left flank Pepcid and Solumedrol  administered.  Able to tolerate remainder of Taxol infusion   Vancomycin     Red Man Syndrome    Amlodipine Swelling   Latex Other (See Comments)    "possibly allergic- dry, itching, rash"    Family History  Problem Relation Age of Onset   Heart disease Brother   Reviewed on admission  Prior to Admission medications   Medication Sig Start Date End Date Taking? Authorizing Provider  acetaminophen (TYLENOL) 500 MG tablet Take 500 mg by mouth every 6 (six) hours as needed.    [provider]  amoxicillin-clavulanate (AUGMENTIN) 875-125 MG tablet Take 1 tablet by mouth 2 (two) times daily. 09/23/22   Ronnald Nian, MD  Ascorbic Acid (VITAMIN C) 1000 MG tablet Take 1,000 mg by mouth daily.    [provider]  Cholecalciferol (VITAMIN D-3) 125 MCG (5000 UT) TABS Take 1 tablet by mouth every other day.    [provider]  hydrochlorothiazide (MICROZIDE) 12.5 MG capsule TAKE ONE CAPSULE BY MOUTH DAILY 12/22/22   Ronnald Nian, MD  ketoconazole (NIZORAL) 2 % cream Apply topically daily. 01/04/23   Ronnald Nian, MD  levothyroxine (SYNTHROID) 50 MCG tablet Take 1 tablet (50 mcg total) by mouth daily before breakfast. 11/10/22   Ronnald Nian, MD  loratadine (CLARITIN) 10 MG tablet Take 10 mg by mouth daily.    [provider]  losartan (COZAAR) 100 MG tablet TAKE ONE TABLET BY MOUTH DAILY 12/28/22   Ronnald Nian, MD  metFORMIN (GLUCOPHAGE-XR) 750 MG 24 hr tablet Take 1 tablet (750 mg total) by mouth daily with breakfast. 08/25/22   Ronnald Nian, MD  metoprolol tartrate (LOPRESSOR) 50 MG tablet TAKE ONE TABLET BY MOUTH TWICE DAILY 12/23/22   Tysinger, Kermit Balo, PA-C  Multiple Vitamins-Minerals (PRESERVISION AREDS 2) CAPS Take 1 capsule by mouth in the morning and at bedtime.    [provider]  Dola Argyle LANCETS FINE MISC Patient is to test one time a day DX: E11.9 07/30/16   Ronnald Nian, MD  [facility] VERIO test strip USE TO TEST BLOOD SUGAR  ONCE DAILY. 12/02/22   Ronnald Nian, MD  predniSONE (DELTASONE) 5 MG tablet Take 1 tablet (5 mg total) by mouth daily with breakfast. Patient not taking: Reported on 08/31/2022 05/25/22   Ronnald Nian, MD  tamsulosin (FLOMAX) 0.4 MG CAPS capsule Take 1 capsule (0.4 mg total) by mouth daily after supper. 08/02/22   Ronnald Nian, MD  triamcinolone cream (KENALOG) 0.1 % APPLY TO AFFECTED AREA TWICE A DAY. 10/14/20   Ronnald Nian, MD    Physical Exam: Vitals:   01/04/23 1405 01/04/23 1414 01/04/23 1500 01/04/23 1700  BP: (!) 168/90  (!) 140/75 (!) 150/75  Pulse: 94  76 82  Resp: 20  20   Temp: 98.3 F (36.8 C)     TempSrc: Oral     SpO2: 100% 97% 100% 97%  Weight:      Height:        Physical Exam Constitutional:  General: He is not in acute distress.    Appearance: Normal appearance. He is obese.  HENT:     Head: Normocephalic and atraumatic.     Mouth/Throat:     Mouth: Mucous membranes are moist.     Pharynx: Oropharynx is clear.  Eyes:     Extraocular Movements: Extraocular movements intact.     Pupils: Pupils are equal, round, and reactive to light.  Cardiovascular:     Rate and Rhythm: Normal rate and regular rhythm.     Pulses: Normal pulses.     Heart sounds: Normal heart sounds.  Pulmonary:     Effort: Pulmonary effort is normal. No respiratory distress.     Breath sounds: Normal breath sounds.  Abdominal:     General: Bowel sounds are normal. There is no distension.     Palpations: Abdomen is soft.     Tenderness: There is no abdominal tenderness.  Musculoskeletal:        General: No swelling or deformity.  Skin:    General: Skin is warm and dry.  Neurological:     Comments: Mental Status: Patient is awake, alert, oriented x3 No signs of aphasia or neglect Cranial Nerves: II: Pupils equal, round, and reactive to light.   III,IV, VI: EOMI without ptosis or diploplia.  V: Facial sensation is symmetric to light touch. VII: Facial movement is  symmetric.  VIII: hearing is intact to voice X: Uvula elevates symmetrically XI: Shoulder shrug is symmetric. XII: tongue is midline without atrophy or fasciculations.  Motor: Good effort thorughout, at Least 5/5 bilateral UE, 5/5 bilateral lower extremitiy  Sensory: Sensation is grossly intact bilateral UEs & LEs Cerebellar: Finger-Nose intact bilalat    Labs on Admission: I have personally reviewed following labs and imaging studies  CBC: Recent Labs  Lab 01/04/23 1435 01/04/23 1502  WBC 7.1  --   NEUTROABS 5.3  --   HGB 11.7* 12.2*  HCT 35.8* 36.0*  MCV 96.2  --   PLT 220  --     Basic Metabolic Panel: Recent Labs  Lab 01/04/23 1435 01/04/23 1502  NA 137 137  K 4.0 4.1  CL 100 99  CO2 28  --   GLUCOSE 108* 105*  BUN 18 23  CREATININE 1.21 1.40*  CALCIUM 9.3  --     GFR: Estimated Creatinine Clearance: 50.9 mL/min (A) (by C-G formula based on SCr of 1.4 mg/dL (H)).  Liver Function Tests: Recent Labs  Lab 01/04/23 1435  AST 15  ALT 12  ALKPHOS 46  BILITOT 0.5  PROT 6.6  ALBUMIN 3.8    Urine analysis:    Component Value Date/Time   COLORURINE STRAW (A) 01/04/2023 1423   APPEARANCEUR CLEAR 01/04/2023 1423   LABSPEC 1.006 01/04/2023 1423   PHURINE 6.0 01/04/2023 1423   GLUCOSEU NEGATIVE 01/04/2023 1423   HGBUR NEGATIVE 01/04/2023 1423   BILIRUBINUR NEGATIVE 01/04/2023 1423   KETONESUR NEGATIVE 01/04/2023 1423   PROTEINUR NEGATIVE 01/04/2023 1423   UROBILINOGEN 0.2 02/12/2014 1427   NITRITE NEGATIVE 01/04/2023 1423   LEUKOCYTESUR TRACE (A) 01/04/2023 1423    Radiological Exams on Admission: CT Head Wo Contrast  Result Date: 01/04/2023 CLINICAL DATA:  Transient ischemic attack (TIA) EXAM: CT HEAD WITHOUT CONTRAST TECHNIQUE: Contiguous axial images were obtained from the base of the skull through the vertex without intravenous contrast. RADIATION DOSE REDUCTION: This exam was performed according to the departmental dose-optimization program which  includes automated exposure control, adjustment of the mA and/or kV  according to patient size and/or use of iterative reconstruction technique. COMPARISON:  None Available. FINDINGS: Brain: Scratch no intracranial hemorrhage, mass effect, or midline shift. Generalized atrophy, not unexpected for age. No hydrocephalus. The basilar cisterns are patent. No evidence of territorial infarct or acute ischemia. No extra-axial or intracranial fluid collection. Vascular: Atherosclerosis of skullbase vasculature without hyperdense vessel or abnormal calcification. Skull: No fracture or focal lesion. Sinuses/Orbits: Paranasal sinuses and mastoid air cells are clear. The visualized orbits are unremarkable. Bilateral cataract resection. Other: None. IMPRESSION: 1. No acute intracranial abnormality. 2. Generalized atrophy, not unexpected for age. Electronically Signed   By: Narda Rutherford M.D.   On: 01/04/2023 16:29   DG Chest Port 1 View  Result Date: 01/04/2023 CLINICAL DATA:  Cough, shortness of breath. EXAM: PORTABLE CHEST 1 VIEW COMPARISON:  September 21, 2020. FINDINGS: The heart size and mediastinal contours are within normal limits. Postsurgical changes are seen in the left mid lung consistent with prior tumor resection. Bibasilar atelectasis or scarring is noted. The visualized skeletal structures are unremarkable. IMPRESSION: Postsurgical changes seen in left mid lung. Bibasilar subsegmental atelectasis or scarring. Electronically Signed   By: Lupita Raider M.D.   On: 01/04/2023 15:34    EKG: Independently reviewed.  Sinus rhythm at 90 bpm.  Minimal baseline artifact.  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  Woke up with symptoms for 3 hours today.  Resolved in the ED. > CT head without acute abnormality in the ED. > Neurology consulted and EDP reports they will see patient in the morning, TIA vs CVA workup. - Appreciate neurology recommendations - Allow for permissive HTN (systolic < 220 and diastolic < 120) - Daily aspirin - History of statin intolerance - Echocardiogram  - CTA head & neck  - MRI brain - A1C  - Lipid panel  - Tele monitoring  - SLP eval - PT/OT  Lung cancer > Known stage IV lung cancer s/p chemotherapy.  Currently on observation only and has been stable. - Follows with oncology outpatient   Carotid artery disease Hyperlipidemia - Not currently on medication due to statin intolerance  Diabetes - SSI  Hypertension - Holding home hydrochlorothiazide, losartan, metoprolol in the setting of permissive hypertension as above  Hypothyroidism > Reportedly secondary to chemo. - Continue home Synthroid  ?\PMR > Patient CBC today rheumatologist who believes he has PMR and is working to wean him off prednisone. - Continue prednisone for now  Obesity Macular degeneration Cataract, Status post cataract surgery - Noted  DVT prophylaxis: Lovenox Code Status:   Full Family Communication:  Updated at bedside  Disposition Plan:   Patient is from:  Home  Anticipated DC to:  Home  Anticipated DC date:  1 to 2 days  Anticipated DC barriers: None  Consults called:  Neurology Admission status:  Observation, telemetry  Severity of Illness: The appropriate patient status for this patient is OBSERVATION. Observation status  is judged to be reasonable and necessary in order  to provide the required intensity of service to ensure the patient's safety. The patient's presenting symptoms, physical exam findings, and initial radiographic and laboratory data in the context of their medical condition is felt to place them at decreased risk for further clinical deterioration. Furthermore, it is anticipated that the patient will be medically stable for discharge from the hospital within 2 midnights of admission.    Synetta Fail MD [facility]  How to contact the [facility] Attending or Consulting provider 7A - 7P or covering provider during after hours 7P -7A, for this patient?   Check the care team in [facility] and look for a) attending/consulting [facility] provider listed and b) the [facility] team listed Log into [email or web address] and use [place]'s universal password to access. If you do not have the password, please contact the hospital operator. Locate the [facility] provider you are looking for under [facility] and page to a number that you can be directly reached. If you still have difficulty reaching the provider, please page the [facility] (Director on Call) for the Hospitalists listed on amion for assistance.  01/04/2023, 5:15 PM

## 2023-01-04 NOTE — ED Provider Notes (Signed)
Patient given in sign out by Arthor Captain, PA-C.  Please review their note for patient HPI, physical exam, workup.  At this time the plan is anticipated admission for suspected TIA.  Patient may need MR brain with without contrast and most likely need neuro consult.  Lab/imaging: I-STAT Chem-8: Unremarkable PT/INR: Unremarkable Respiratory panel:Unremarkable WU:[id number] AOZ:[id number] XBM:[id number] CBC with differential:Unremarkable aPTT:Unremarkable Ethanol:Unremarkable Chest x-ray: Postsurgical scarring CT head without contrast: No acute changes EKG: Sinus 90 bpm, no ST elevations or depressions noted, no blocks noted  Consults: Otelia Limes, MD Neurologist; Alinda Money, MD Hospitalist  .Critical Care  Performed by: Netta Corrigan, PA-C Authorized by: Netta Corrigan, PA-C   Critical care provider statement:    Critical care time (minutes):  30   Critical care time was exclusive of:  Separately billable procedures and treating other patients   Critical care was necessary to treat or prevent imminent or life-threatening deterioration of the following conditions: tPA consideration.   Critical care was time spent personally by me on the following activities:  Development of treatment plan with patient or surrogate, discussions with consultants, evaluation of patient's response to treatment, examination of patient, obtaining history from patient or surrogate, review of old charts, re-evaluation of patient's condition, pulse oximetry, ordering and review of radiographic studies, ordering and review of laboratory studies and ordering and performing treatments and interventions   I assumed direction of critical care for this patient from another provider in my specialty: no     Care discussed with: admitting provider    Plan: Patient CT came back unremarkable and after speak with neurology we agree patient most likely had a TIA and will need admission. Patient is not tPA candidate at  this time as this appears to be TIA in nature and has been going on for several days. Neurology did not recommend any further imaging at this time.  I spoke to the hospitalist and hospitalist accepted patient for admission.  The family was updated of this plan and the family and patient both verbalized their understanding and acceptance. patient stable for admission at this time.    Remi Deter 01/04/23 1809    Gerhard Munch, MD 01/05/23 [phone]

## 2023-01-04 NOTE — ED Provider Notes (Signed)
[place] EMERGENCY DEPARTMENT AT [facility] Provider Note   CSN: [id number] Arrival date & time: 01/04/23  1356     History  Chief Complaint  Patient presents with   Cough   Aphasia    Alex Edwards is a 87 y.o. male with a past medical history of metastatic non-small cell lung carcinoma who presents with complaints of intermittent abnormal neurologic symptoms and cough.  Patient discontinued chemo 2 months ago.  He has been having serial CT scans with Dr. Shirline Frees states that he has not had a progression of his disease.  He notes that since Sunday he has been having intermittent episodes of difficulty with slurred speech, blurry vision and left arm weakness.  The symptoms seem to come and go.  This morning he woke up with symptoms.  His symptoms lasted for approximately 3 hours.  He has a past medical history of macular degeneration of the right eye.  He states he could not tell if his right eye was blurry or not but he definitely had abnormal vision in the left eye.  He is also been having intermittent episodes of diaphoresis and is waking up with night sweats.  He also reports that over the past several weeks has had worsening cough productive of clear sputum, shortness of breath and increased need to wear his oxygen throughout the day and evening.   Cough      Home Medications Prior to Admission medications   Medication Sig Start Date End Date Taking? Authorizing Provider  acetaminophen (TYLENOL) 500 MG tablet Take 500 mg by mouth every 6 (six) hours as needed.    [provider]  amoxicillin-clavulanate (AUGMENTIN) 875-125 MG tablet Take 1 tablet by mouth 2 (two) times daily. 09/23/22   Ronnald Nian, MD  Ascorbic Acid (VITAMIN C) 1000 MG tablet Take 1,000 mg by mouth daily.    [provider]  Cholecalciferol (VITAMIN D-3) 125 MCG (5000 UT) TABS Take 1 tablet by mouth every other day.    [provider]  hydrochlorothiazide (MICROZIDE)  12.5 MG capsule TAKE ONE CAPSULE BY MOUTH DAILY 12/22/22   Ronnald Nian, MD  ketoconazole (NIZORAL) 2 % cream Apply topically daily. 01/04/23   Ronnald Nian, MD  levothyroxine (SYNTHROID) 50 MCG tablet Take 1 tablet (50 mcg total) by mouth daily before breakfast. 11/10/22   Ronnald Nian, MD  loratadine (CLARITIN) 10 MG tablet Take 10 mg by mouth daily.    [provider]  losartan (COZAAR) 100 MG tablet TAKE ONE TABLET BY MOUTH DAILY 12/28/22   Ronnald Nian, MD  metFORMIN (GLUCOPHAGE-XR) 750 MG 24 hr tablet Take 1 tablet (750 mg total) by mouth daily with breakfast. 08/25/22   Ronnald Nian, MD  metoprolol tartrate (LOPRESSOR) 50 MG tablet TAKE ONE TABLET BY MOUTH TWICE DAILY 12/23/22   Tysinger, Kermit Balo, PA-C  Multiple Vitamins-Minerals (PRESERVISION AREDS 2) CAPS Take 1 capsule by mouth in the morning and at bedtime.    [provider]  Dola Argyle LANCETS FINE MISC Patient is to test one time a day DX: E11.9 07/30/16   Ronnald Nian, MD  [facility] VERIO test strip USE TO TEST BLOOD SUGAR ONCE DAILY. 12/02/22   Ronnald Nian, MD  predniSONE (DELTASONE) 5 MG tablet Take 1 tablet (5 mg total) by mouth daily with breakfast. Patient not taking: Reported on 08/31/2022 05/25/22   Ronnald Nian, MD  tamsulosin (FLOMAX) 0.4 MG CAPS capsule Take 1  capsule (0.4 mg total) by mouth daily after supper. 08/02/22   Ronnald Nian, MD  triamcinolone cream (KENALOG) 0.1 % APPLY TO AFFECTED AREA TWICE A DAY. 10/14/20   Ronnald Nian, MD      Allergies    Paclitaxel, Vancomycin, Amlodipine, and Latex    Review of Systems   Review of Systems  Respiratory:  Positive for cough.     Physical Exam Updated Vital Signs BP (!) 168/90 (BP Location: Right Arm)   Pulse 94   Temp 98.3 F (36.8 C) (Oral)   Resp 20   Ht 6\' 1"  (1.854 m)   Wt 113.4 kg   SpO2 97%   BMI 32.98 kg/m  Physical Exam Vitals and nursing note reviewed.  Constitutional:      General: He is not in acute  distress.    Appearance: He is well-developed. He is not diaphoretic.  HENT:     Head: Normocephalic and atraumatic.     Nose: Nose normal.     Mouth/Throat:     Mouth: Mucous membranes are moist.  Eyes:     General: No scleral icterus.    Extraocular Movements: Extraocular movements intact.     Conjunctiva/sclera: Conjunctivae normal.     Pupils: Pupils are equal, round, and reactive to light.  Cardiovascular:     Rate and Rhythm: Normal rate and regular rhythm.     Heart sounds: Normal heart sounds.  Pulmonary:     Effort: Pulmonary effort is normal. No respiratory distress.     Breath sounds: Normal breath sounds.  Abdominal:     Palpations: Abdomen is soft.     Tenderness: There is no abdominal tenderness.  Musculoskeletal:     Cervical back: Normal range of motion and neck supple.  Skin:    General: Skin is warm and dry.  Neurological:     Mental Status: He is alert.     Comments: Speech is clear and goal oriented, follows commands Major Cranial nerves without deficit, no facial droop Normal strength in upper and lower extremities bilaterally including dorsiflexion and plantar flexion, strong and equal grip strength Sensation normal to light and sharp touch Moves extremities without ataxia, coordination intact Normal finger to nose and rapid alternating movements Neg romberg, no pronator drift Normal gait Normal heel-shin and balance   Psychiatric:        Behavior: Behavior normal.     ED Results / Procedures / Treatments   Labs (all labs ordered are listed, but only abnormal results are displayed) Labs Reviewed - No data to display  EKG EKG Interpretation Date/Time:  Tuesday January 04 2023 14:03:07 EDT Ventricular Rate:  90 PR Interval:  198 QRS Duration:  87 QT Interval:  344 QTC Calculation: 421 R Axis:   13  Text Interpretation: Sinus rhythm Confirmed by Eber Hong ([id number]) on 01/05/2023 12:57:41 PM  Radiology No results  found.  Procedures Procedures    Medications Ordered in ED Medications - No data to display  ED Course/ Medical Decision Making/ A&P                             Medical Decision Making Amount and/or Complexity of Data Reviewed Labs: ordered. Radiology: ordered.  Risk Prescription drug management. Decision regarding hospitalization.   87 y/o male with neuro deficits, resolved. Ddx includes Stroke, TIA, recrudescence, metatstic disease to brain. Sxs resolved and out of the window for TNK I personally visualized and  interpreted the images using our PACS system. Acute findings include:  CXR show no acute findings, resolution of old mass, and no evicence of infiltrate.  Labs:no acute findings. Mild anemia and hyperglycemia clinically insignificant to emergency work up.  CT head results pending. - will need MRI br w/wo if negative for stroke.        Final Clinical Impression(s) / ED Diagnoses Final diagnoses:  TIA (transient ischemic attack)    Rx / DC Orders ED Discharge Orders     None         Arthor Captain, PA-C 01/06/23 1008    Gerhard Munch, MD 01/09/23 1236

## 2023-01-05 ENCOUNTER — Observation Stay (HOSPITAL_COMMUNITY): Payer: Medicare Other

## 2023-01-05 ENCOUNTER — Observation Stay (HOSPITAL_BASED_OUTPATIENT_CLINIC_OR_DEPARTMENT_OTHER): Payer: Medicare Other

## 2023-01-05 DIAGNOSIS — M353 Polymyalgia rheumatica: Secondary | ICD-10-CM | POA: Insufficient documentation

## 2023-01-05 DIAGNOSIS — I639 Cerebral infarction, unspecified: Secondary | ICD-10-CM

## 2023-01-05 DIAGNOSIS — E119 Type 2 diabetes mellitus without complications: Secondary | ICD-10-CM | POA: Diagnosis not present

## 2023-01-05 DIAGNOSIS — E1169 Type 2 diabetes mellitus with other specified complication: Secondary | ICD-10-CM | POA: Diagnosis not present

## 2023-01-05 DIAGNOSIS — C3490 Malignant neoplasm of unspecified part of unspecified bronchus or lung: Secondary | ICD-10-CM | POA: Diagnosis not present

## 2023-01-05 DIAGNOSIS — I6521 Occlusion and stenosis of right carotid artery: Secondary | ICD-10-CM | POA: Diagnosis not present

## 2023-01-05 DIAGNOSIS — I6389 Other cerebral infarction: Secondary | ICD-10-CM | POA: Diagnosis not present

## 2023-01-05 DIAGNOSIS — K118 Other diseases of salivary glands: Secondary | ICD-10-CM | POA: Diagnosis not present

## 2023-01-05 DIAGNOSIS — E1159 Type 2 diabetes mellitus with other circulatory complications: Secondary | ICD-10-CM | POA: Diagnosis not present

## 2023-01-05 DIAGNOSIS — I152 Hypertension secondary to endocrine disorders: Secondary | ICD-10-CM | POA: Diagnosis not present

## 2023-01-05 DIAGNOSIS — G63 Polyneuropathy in diseases classified elsewhere: Secondary | ICD-10-CM | POA: Diagnosis not present

## 2023-01-05 DIAGNOSIS — C801 Malignant (primary) neoplasm, unspecified: Secondary | ICD-10-CM | POA: Diagnosis not present

## 2023-01-05 DIAGNOSIS — E039 Hypothyroidism, unspecified: Secondary | ICD-10-CM | POA: Insufficient documentation

## 2023-01-05 DIAGNOSIS — C3492 Malignant neoplasm of unspecified part of left bronchus or lung: Secondary | ICD-10-CM | POA: Diagnosis not present

## 2023-01-05 DIAGNOSIS — E785 Hyperlipidemia, unspecified: Secondary | ICD-10-CM | POA: Diagnosis not present

## 2023-01-05 DIAGNOSIS — I6523 Occlusion and stenosis of bilateral carotid arteries: Secondary | ICD-10-CM | POA: Diagnosis not present

## 2023-01-05 DIAGNOSIS — I6502 Occlusion and stenosis of left vertebral artery: Secondary | ICD-10-CM | POA: Diagnosis not present

## 2023-01-05 LAB — ECHOCARDIOGRAM COMPLETE
AR max vel: 2.35 cm2
AV Area VTI: 2.39 cm2
AV Area mean vel: 2.36 cm2
AV Mean grad: 11 mmHg
AV Peak grad: 18.5 mmHg
Ao pk vel: 2.15 m/s
Area-P 1/2: 2.04 cm2
Calc EF: 68.6 %
Height: 73 in
MV VTI: 2.91 cm2
S' Lateral: 3 cm
Single Plane A2C EF: 68.6 %
Single Plane A4C EF: 68.7 %
Weight: 4000 oz

## 2023-01-05 LAB — GLUCOSE, CAPILLARY
Glucose-Capillary: 126 mg/dL — ABNORMAL HIGH (ref 70–99)
Glucose-Capillary: 130 mg/dL — ABNORMAL HIGH (ref 70–99)
Glucose-Capillary: 122 mg/dL — ABNORMAL HIGH (ref 70–99)
Glucose-Capillary: 159 mg/dL — ABNORMAL HIGH (ref 70–99)

## 2023-01-05 LAB — LIPID PANEL
Cholesterol: 215 mg/dL — ABNORMAL HIGH (ref 0–200)
HDL: 41 mg/dL (ref >40)
LDL Cholesterol: 135 mg/dL — ABNORMAL HIGH (ref 0–99)
Total CHOL/HDL Ratio: 5.2 RATIO
Triglycerides: 194 mg/dL — ABNORMAL HIGH (ref <150)
VLDL: 39 mg/dL (ref 0–40)

## 2023-01-05 LAB — HEMOGLOBIN A1C
Hgb A1c MFr Bld: 6.6 % — ABNORMAL HIGH (ref 4.8–5.6)
Mean Plasma Glucose: 142.72 mg/dL

## 2023-01-05 MED ORDER — ASPIRIN 81 MG PO TBEC
81.0000 mg | DELAYED_RELEASE_TABLET | Freq: Every day | ORAL | Status: DC
  Administered 2023-01-05 – 2023-01-06 (×2): 81 mg via ORAL
  Filled 2023-01-05 (×2): qty 1

## 2023-01-05 MED ORDER — ALUM & MAG HYDROXIDE-SIMETH 200-200-20 MG/5ML PO SUSP
30.0000 mL | ORAL | Status: DC | PRN
  Administered 2023-01-05 (×3): 30 mL via ORAL
  Filled 2023-01-05 (×3): qty 30

## 2023-01-05 MED ORDER — IOHEXOL 350 MG/ML SOLN
75.0000 mL | Freq: Once | INTRAVENOUS | Status: AC | PRN
  Administered 2023-01-05: 75 mL via INTRAVENOUS

## 2023-01-05 MED ORDER — EZETIMIBE 10 MG PO TABS
10.0000 mg | ORAL_TABLET | Freq: Every day | ORAL | Status: DC
  Filled 2023-01-05: qty 1

## 2023-01-05 NOTE — Evaluation (Addendum)
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) 12/12/2020   Encounter for antineoplastic chemotherapy 12/04/2020   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   Internal carotid artery stenosis, right 11/21/2020   Aortic atherosclerosis (HCC) 02/14/2018   Onychomycosis 09/26/2017   Macular degeneration 01/25/2017   History of cataract surgery 01/25/2017   Statin intolerance 09/22/2016   Controlled type 2 diabetes mellitus without complication, without long-term current use of insulin (HCC) 09/22/2016   Senile purpura (HCC) 01/13/2015   Hx of adenomatous colonic polyps 01/13/2015   Actinic keratosis of multiple sites of head and neck 12/09/2014   Status post THR (total hip replacement) 01/04/2014   Hypertension associated with diabetes (HCC) 10/14/2011   Hyperlipidemia associated with type 2 diabetes mellitus (HCC) 10/14/2011   Psoriasis 10/14/2011   Obesity (BMI 30-39.9) 10/14/2011   Allergic rhinitis due to pollen 10/14/2011   Former smoker 10/14/2011   Past Medical History:  Past Medical History:  Diagnosis Date   BCE (basal cell epithelioma)    Bruises easily    on hands   Cough    smokers cough or perfurmes   Degeneration macular    right eye   Diabetes mellitus without complication (HCC)    Dyslipidemia    ED (erectile dysfunction)    Hx of adenomatous colonic polyps    Hyperlipidemia    Hypertension    borderline    Obesity    Psoriasis    Seasonal allergies    Smoker    former   Past Surgical History:  Past Surgical History:  Procedure Laterality Date   BRONCHIAL BRUSHINGS  11/18/2020   Procedure: BRONCHIAL BRUSHINGS;  Surgeon: Charlott Holler, MD;  Location: [facility];  Service: Pulmonary;;   BRONCHIAL NEEDLE ASPIRATION BIOPSY  11/18/2020   Procedure: BRONCHIAL NEEDLE ASPIRATION BIOPSIES;  Surgeon: Charlott Holler, MD;  Location: [facility];  Service: Pulmonary;;   BRONCHIAL WASHINGS  11/18/2020   Procedure: BRONCHIAL WASHINGS;  Surgeon: Charlott Holler, MD;  Location: [facility];  Service: Pulmonary;;   CATARACT EXTRACTION, BILATERAL Bilateral 2013   COLONOSCOPY  2005   [facility]   ENDOBRONCHIAL ULTRASOUND N/A 11/18/2020   Procedure: ENDOBRONCHIAL ULTRASOUND;  Surgeon: Charlott Holler, MD;  Location: [facility];  Service: Pulmonary;  Laterality: N/A;   POLYPECTOMY     SKIN CANCER EXCISION Right 1993   under eye-MOHS, freeze multiple places freq   TONSILLECTOMY  as child   TOTAL HIP ARTHROPLASTY Left 01/04/2014   Procedure: LEFT TOTAL HIP ARTHROPLASTY ANTERIOR APPROACH;  Surgeon: Kathryne Hitch, MD;  Location: [facility];  Service: Orthopedics;  Laterality: Left;   TOTAL HIP ARTHROPLASTY Left 02/14/2014   Procedure: Irrigation and Debridement left hip;  Surgeon: Kathryne Hitch, MD;  Location: [facility];  Service: Orthopedics;  Laterality: Left;   HPI:  Pt is an 87 yo male presenting to ED 7/9 with intermittent episodes of L arm weakness, dysarthria, and blurry vision x2 days. Per MD note, pt reports increased symptoms of  chronic cough with sputum production in setting of lung cancer. MRI Brain revealed acute infarct in R inferior frontal gyrus. PMH inlcudes lung cancer, former tobacco use, HTN, HLD, DM, R eye macular degeneration, basal cell epithelioma   Assessment / Plan / Recommendation Clinical Impression  Pt's wife and son in room upon SLP arrival who report no acute concerns  with pt's cognition or speech. Pt reports living at home with his wife and son and handling all medications and finances independently. He states that he experienced a period of dysarthria yesterday, but he agrees that it has since resolved. He scored a 28/30 on the SLUMS, which is considered WFL. No further SLP f/u is necessary.    SLP Assessment  SLP Recommendation/Assessment: Patient does not need any further Speech Lanaguage Pathology Services SLP Visit Diagnosis: Cognitive communication deficit (R41.841)    Recommendations for follow up therapy are one component of a multi-disciplinary discharge planning process, led by the attending physician.  Recommendations may be updated based on patient status, additional functional criteria and insurance authorization.    Follow Up Recommendations  No SLP follow up    Assistance Recommended at Discharge  Intermittent Supervision/Assistance  Functional Status Assessment Patient has not had a recent decline in their functional status  Frequency and Duration           SLP Evaluation Cognition  Overall Cognitive Status: Within Functional Limits for tasks assessed       Comprehension  Auditory Comprehension Overall Auditory Comprehension: Appears within functional limits for tasks assessed Visual Recognition/Discrimination Discrimination: Not tested Reading Comprehension Reading Status: Not tested    Expression Expression Primary Mode of Expression: Verbal Verbal Expression Overall Verbal Expression: Appears within functional limits for tasks assessed Written Expression Dominant Hand: Right   Oral / Motor  Oral Motor/Sensory Function Overall Oral Motor/Sensory Function: Within functional limits Motor Speech Overall Motor Speech: Appears within functional limits for tasks assessed            Gwynneth Aliment, M.A., CF-SLP Speech Language Pathology, Acute Rehabilitation Services  Secure Chat preferred [phone]  01/05/2023, 3:17  PM

## 2023-01-05 NOTE — Progress Notes (Signed)
Physical Therapy Evaluation Patient Details Name: Alex Edwards MRN: [id number] DOB: [birth year] Today's Date: 01/05/2023  History of Present Illness  87 yo male was admitted on 7/9 for L side weakness UE, speech changes and vision blurring L eye.MRI positive for R inferior frontal gyrus infarct, no brain mets from lung CA.  PMHx:  DM, colon polyps, obesity, psoriasis, smoker, macular degeneration R eye, basal cell epithelioma  Clinical Impression  Pt was seen for progression of mobility on RW to get to hallway and back with 3L O2.  Pt reports he is on 3.5 L at home, so for purpose of eval was able to walk with no desaturation.  However he does fatigue and require some recovery, so encouraged him to take a moment to rest.  He is admitted with findings of new infarct, and with his breathing issues would recommend home therapy to restore his safety and improve endurance.  Pt is in agreement but will need to see how he progresses as well.  Follow for goals of PT as are outlined below.        Assistance Recommended at Discharge Intermittent Supervision/Assistance  If plan is discharge home, recommend the following:  Can travel by private vehicle  A little help with walking and/or transfers;Assistance with cooking/housework;Assist for transportation;Help with stairs or ramp for entrance        Equipment Recommendations None recommended by PT  Recommendations for Other Services       Functional Status Assessment Patient has had a recent decline in their functional status and demonstrates the ability to make significant improvements in function in a reasonable and predictable amount of time.     Precautions / Restrictions Precautions Precautions: Fall Precaution Comments: oxygen needs Restrictions Weight Bearing Restrictions: No      Mobility  Bed Mobility Overal bed mobility: Modified Independent                  Transfers Overall transfer level: Modified  independent Equipment used: Rolling walker (2 wheels)               General transfer comment: tends to use arms more than legs    Ambulation/Gait Ambulation/Gait assistance: Min guard Gait Distance (Feet): 90 Feet Assistive device: Rolling walker (2 wheels) Gait Pattern/deviations: Step-through pattern, Decreased stride length Gait velocity: reduced Gait velocity interpretation: <1.31 ft/sec, indicative of household ambulator Pre-gait activities: standing balance and posture ck General Gait Details: pt was fatigued and took one standing rest, then completed walk with 3L O2  Stairs            Wheelchair Mobility     Tilt Bed    Modified Rankin (Stroke Patients Only)       Balance Overall balance assessment: Needs assistance Sitting-balance support: Feet supported                                         Pertinent Vitals/Pain Pain Assessment Pain Assessment: No/denies pain    Home Living Family/patient expects to be discharged to:: Private residence Living Arrangements: Spouse/significant other Available Help at Discharge: Family;Available 24 hours/day Type of Home: House Home Access: Stairs to enter Entrance Stairs-Rails:  (post at door to hold) Secretary/administrator of Steps: 2   Home Layout: Laundry or work area in basement;One level Home Equipment: Shower seat;Hand held Armed forces logistics/support/administrative officer (2 wheels);Cane - single point Additional Comments: No animals  Prior Function Prior Level of Function : Independent/Modified Independent;Driving;History of Falls (last six months) (grocery store and pharmacy - the drug store is 24 mile round trip)             Mobility Comments: does not use DME, wears oxygen at home with a long tube. pt does not take oxygen with him when he is out in the community but needs a portable tank for community       Hand Dominance   Dominant Hand: Right    Extremity/Trunk Assessment    Upper Extremity Assessment Upper Extremity Assessment: Overall WFL for tasks assessed    Lower Extremity Assessment Lower Extremity Assessment: Defer to PT evaluation    Cervical / Trunk Assessment Cervical / Trunk Assessment: Kyphotic  Communication   Communication: No difficulties  Cognition Arousal/Alertness: Awake/alert Behavior During Therapy: WFL for tasks assessed/performed Overall Cognitive Status: Within Functional Limits for tasks assessed                                          General Comments General comments (skin integrity, edema, etc.): pt on RA during session for 20 ft noted to drop oxygen saturations to 85%. pt educated durign session on purse lip breathing with return demonstration x2 during session. Pt with return demo of oxygen increased with static standing and breathing strategies. pt reports need for portable oxygen tank that he can wear and not the large tanks. Wife in room and starting to fall asleepin chair with a chirping sound comign from the wife. OT alerted son in room who called wife by name. wife states 'its coming from that bed" in reference to the sound. OT had wife check her pocket and it was her glucose meter sounding due to sugar level >230 at present. Wife denies any need for any intervention at this time    Exercises     Assessment/Plan    PT Assessment Patient needs continued PT services  PT Problem List Decreased activity tolerance;Decreased balance;Decreased mobility;Cardiopulmonary status limiting activity       PT Treatment Interventions DME instruction;Gait training;Stair training;Functional mobility training;Therapeutic activities;Therapeutic exercise;Balance training;Neuromuscular re-education;Patient/family education    PT Goals (Current goals can be found in the Care Plan section)  Acute Rehab PT Goals Patient Stated Goal: to walk and get home with assistance PT Goal Formulation: With patient/family Time For Goal  Achievement: 01/19/23 Potential to Achieve Goals: Good    Frequency Min 3X/week     Co-evaluation               AM-PAC PT "6 Clicks" Mobility  Outcome Measure Help needed turning from your back to your side while in a flat bed without using bedrails?: A Little Help needed moving from lying on your back to sitting on the side of a flat bed without using bedrails?: A Little Help needed moving to and from a bed to a chair (including a wheelchair)?: A Little Help needed standing up from a chair using your arms (e.g., wheelchair or bedside chair)?: A Little Help needed to walk in hospital room?: A Little Help needed climbing 3-5 steps with a railing? : A Lot 6 Click Score: 17    End of Session Equipment Utilized During Treatment: Gait belt;Oxygen Activity Tolerance: Patient tolerated treatment well Patient left: in bed;with call bell/phone within reach;with bed alarm set Nurse Communication: Mobility status PT Visit Diagnosis: Difficulty in  walking, not elsewhere classified (R26.2);Other abnormalities of gait and mobility (R26.89)    Time: 1308-6578 PT Time Calculation (min) (ACUTE ONLY): 20 min   Charges:   PT Evaluation $PT Eval Moderate Complexity: 1 Mod   PT General Charges $$ ACUTE PT VISIT: 1 Visit        Ivar Drape 01/05/2023, 4:37 PM  Samul Dada, PT PhD Acute Rehab Dept. Number: [facility] [id number] and [facility] [phone]

## 2023-01-05 NOTE — Evaluation (Signed)
Occupational Therapy Evaluation Patient Details Name: Alex Edwards MRN: [id number] DOB: 01-01-1936 Today's Date: 01/05/2023   History of Present Illness 87 yo male was admitted on 7/9 for L side weakness UE, speech changes and vision blurring L eye.MRI positive for R inferior frontal gyrus infarct, no brain mets from lung CA.  PMHx:  DM, colon polyps, obesity, psoriasis, smoker, macular degeneration R eye, basal cell epithelioma   Clinical Impression   PT admitted with CVA. Pt currently with functional limitiations due to the deficits listed below (see OT problem list). Pt at baseline indep with all adls and drives in the community. Pt reports all community participation on RA due to not wanting to take large oxygen tank. Pt wanting to qualify for smaller portable tank. Pt noted to go 20ft on RA with 85% saturations. Pt over estimates his current oxygen to be around 88% showing decreased awareness to risk on RA.  Pt will benefit from skilled OT to increase their independence and safety with adls and balance to allow discharge hhot ( safety for home with oxygen / showers) .  Pt and family educated on fall risk increase due to decreased saturations.        Recommendations for follow up therapy are one component of a multi-disciplinary discharge planning process, led by the attending physician.  Recommendations may be updated based on patient status, additional functional criteria and insurance authorization.   Assistance Recommended at Discharge Set up Supervision/Assistance  Patient can return home with the following A little help with bathing/dressing/bathroom;Direct supervision/assist for medications management;Direct supervision/assist for financial management    Functional Status Assessment  Patient has had a recent decline in their functional status and demonstrates the ability to make significant improvements in function in a reasonable and predictable amount of time.  Equipment  Recommendations  None recommended by OT    Recommendations for Other Services Speech consult     Precautions / Restrictions Precautions Precautions: Fall Precaution Comments: oxygen needs Restrictions Weight Bearing Restrictions: No      Mobility Bed Mobility Overal bed mobility: Modified Independent             General bed mobility comments: bed placed in same level as home and able to exit on L side with no rails    Transfers                          Balance Overall balance assessment: Needs assistance         Standing balance support: Bilateral upper extremity supported, During functional activity, Reliant on assistive device for balance Standing balance-Leahy Scale: Fair                             ADL either performed or assessed with clinical judgement   ADL Overall ADL's : Needs assistance/impaired Eating/Feeding: Set up;Sitting       Upper Body Bathing: Sitting;Min guard   Lower Body Bathing: Min guard;Sit to/from stand   Upper Body Dressing : Min guard;Sitting   Lower Body Dressing: Moderate assistance;Sit to/from stand   Toilet Transfer: Min guard;Ambulation;Rolling walker (2 wheels);Regular Toilet           Functional mobility during ADLs: Min guard;Rolling walker (2 wheels) (noted to have decreased O2 saturations with pt generalized saturations would be higher than present.) General ADL Comments: pt completed session on room air as pt reports that he baths without oxygen and  does community transfers without oxygen     Vision Baseline Vision/History: 1 Wears glasses;6 Macular Degeneration (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 pt with L eye blurry vision in all visual  fields with R eye occlusion.     Perception     Praxis      Pertinent Vitals/Pain Pain Assessment Pain Assessment: No/denies pain     Hand Dominance Right   Extremity/Trunk Assessment Upper Extremity Assessment Upper Extremity Assessment: Overall WFL for tasks assessed   Lower Extremity Assessment Lower Extremity Assessment: Defer to PT evaluation   Cervical / Trunk Assessment Cervical / Trunk Assessment: Kyphotic   Communication Communication Communication: No difficulties   Cognition Arousal/Alertness: Awake/alert Behavior During Therapy: WFL for tasks assessed/performed Overall Cognitive Status: Within Functional Limits for tasks assessed                                       General Comments  pt on RA during session for 20 ft noted to drop oxygen saturations to 85%. pt educated durign session on purse lip breathing with return demonstration x2 during session. Pt with return demo of oxygen increased with static standing and breathing strategies. pt reports need for portable oxygen tank that he can wear and not the large tanks. Wife in room and starting to fall asleepin chair with a chirping sound comign from the wife. OT alerted son in room who called wife by name. wife states 'its coming from that bed" in reference to the sound. OT had wife check her pocket and it was her glucose meter sounding due to sugar level >230 at present. Wife denies any need for any intervention at this time    Exercises     Shoulder Instructions      Home Living Family/patient expects to be discharged to:: Private residence Living Arrangements: Spouse/significant other Available Help at Discharge: Family;Available 24 hours/day Type of Home: House Home Access: Stairs to enter [employer] of Steps: 2 Entrance Stairs-Rails:  (post at door to hold) Home Layout: Laundry or work area in basement;One level     Bathroom Shower/Tub: Chief Strategy Officer:  Standard     Home Equipment: Shower seat;Hand held Armed forces logistics/support/administrative officer (2 wheels);Cane - single point   Additional Comments: No animals  Lives With: Spouse;Son    Prior Functioning/Environment Prior Level of Function : Independent/Modified Independent;Driving;History of Falls (last six months) (grocery store and pharmacy - the drug store is 24 mile round trip)             Mobility Comments: does not use DME, wears oxygen at home with a long tube. pt does not take oxygen with him when he is out in the community but needs a portable tank for community          OT Problem List: Decreased activity tolerance;Impaired balance (sitting and/or standing);Decreased safety awareness;Decreased knowledge of use of DME or AE;Decreased knowledge of precautions;Obesity      OT Treatment/Interventions: Self-care/ADL training;Therapeutic exercise;Neuromuscular education;Energy conservation;DME and/or AE instruction;Manual therapy;Therapeutic activities;Cognitive remediation/compensation;Visual/perceptual remediation/compensation;Patient/family education;Balance training    OT Goals(Current goals can be found in the care plan section) Acute  Rehab OT Goals Patient Stated Goal: to get a portable oxygen tank OT Goal Formulation: With patient/family Time For Goal Achievement: 01/19/23 Potential to Achieve Goals: Good  OT Frequency: Min 2X/week    Co-evaluation              AM-PAC OT "6 Clicks" Daily Activity     Outcome Measure Help from another person eating meals?: None Help from another person taking care of personal grooming?: None Help from another person toileting, which includes using toliet, bedpan, or urinal?: A Little Help from another person bathing (including washing, rinsing, drying)?: A Little Help from another person to put on and taking off regular upper body clothing?: None Help from another person to put on and taking off regular lower body clothing?: A  Little 6 Click Score: 21   End of Session Equipment Utilized During Treatment: Gait belt;Rolling walker (2 wheels) Nurse Communication: Mobility status;Precautions  Activity Tolerance: Patient tolerated treatment well Patient left: in bed;with call bell/phone within reach;with bed alarm set;with family/visitor present;Other (comment) (SLP arriving and dove tailing session)  OT Visit Diagnosis: Unsteadiness on feet (R26.81);Muscle weakness (generalized) (M62.81)                Time: 1433-1500 OT Time Calculation (min): 27 min Charges:  OT General Charges $OT Visit: 1 Visit OT Evaluation $OT Eval Moderate Complexity: 1 Mod   Brynn, OTR/L  Acute Rehabilitation Services Office: [phone] .   Mateo Flow 01/05/2023, 4:02 PM

## 2023-01-05 NOTE — Progress Notes (Addendum)
STROKE TEAM PROGRESS NOTE   INTERVAL HISTORY His wife is at the bedside.  Patient is sitting up in hospital bed, in no acute distress.  No acute overnight events.  Patient endorses ongoing intermittent left arm tingling with a "falling asleep" sensation that improves with repositioning of the left arm.  Patient does complain of intermittent left eye blurry vision with bilateral eye burning and watering.  MRI brain with acute infarct in the right inferior frontal gyrus. CTA head and neck negative for LVO but positive for severe right ICA origin stenosis approaching a radiographic string sign.  Up to 60% stenosis in the left ICA origin.  Vascular surgery contacted.  Patient reports that he is not currently taking any medication for his cholesterol but he does state he is unable to take statin medications.  He does have concerns with affording his medication at this time.  Patient requests Repatha as his wife is on it and states that he is only able to take this medication if he is able to get financial assistance.  Vitals:   01/04/23 2256 01/05/23 0021 01/05/23 0336 01/05/23 0735  BP: (!) 144/74 (!) 99/59 (!) 97/42 (!) 130/59  Pulse: (!) 103 90 96 94  Resp: 18 18 18 20   Temp: 98.6 F (37 C) 98.6 F (37 C) 98.4 F (36.9 C) 97.6 F (36.4 C)  TempSrc: Oral Oral Oral Oral  SpO2: 96% 96% 90% 97%  Weight:      Height:       CBC:  Recent Labs  Lab 01/04/23 1435 01/04/23 1502  WBC 7.1  --   NEUTROABS 5.3  --   HGB 11.7* 12.2*  HCT 35.8* 36.0*  MCV 96.2  --   PLT 220  --    Basic Metabolic Panel:  Recent Labs  Lab 01/04/23 1435 01/04/23 1502  NA 137 137  K 4.0 4.1  CL 100 99  CO2 28  --   GLUCOSE 108* 105*  BUN 18 23  CREATININE 1.21 1.40*  CALCIUM 9.3  --    Lipid Panel:  Recent Labs  Lab 01/05/23 0226  CHOL 215*  TRIG 194*  HDL 41  CHOLHDL 5.2  VLDL 39  LDLCALC 161*   HgbA1c:  Recent Labs  Lab 01/05/23 0226  HGBA1C 6.6*   Urine Drug Screen:  Recent Labs   Lab 01/04/23 1423  LABOPIA NONE DETECTED  COCAINSCRNUR NONE DETECTED  LABBENZ NONE DETECTED  AMPHETMU NONE DETECTED  THCU NONE DETECTED  LABBARB NONE DETECTED    Alcohol Level  Recent Labs  Lab 01/04/23 1435  ETH <10    IMAGING past 24 hours MR BRAIN W WO CONTRAST  Result Date: 01/04/2023 CLINICAL DATA:  History of lung cancer, stroke suspected EXAM: MRI HEAD WITHOUT AND WITH CONTRAST TECHNIQUE: Multiplanar, multiecho pulse sequences of the brain and surrounding structures were obtained without and with intravenous contrast. CONTRAST:  10mL GADAVIST GADOBUTROL 1 MMOL/ML IV SOLN COMPARISON:  MRI head 11/20/2020, correlation is also made with CT head 01/04/2023 FINDINGS: Brain: Focus of restricted diffusion with ADC correlate in the right inferior frontal gyrus (series 2, image 32 and series 250, image 32), which correlates with increased T2 hyperintense signal but no contrast enhancement. No abnormal parenchymal or meningeal enhancement. No acute hemorrhage, mass, mass effect, or midline shift. No hydrocephalus or extra-axial collection. No hemosiderin deposition to suggest remote hemorrhage. Normal cerebral volume for age. Vascular: Normal arterial flow voids. Normal arterial and venous enhancement. Skull and upper cervical spine: Normal  marrow signal. Sinuses/Orbits: Clear paranasal sinuses. No acute finding in the orbits. Other: The mastoid air cells are well aerated. IMPRESSION: Acute infarct in the right inferior frontal gyrus. No evidence of metastatic disease in the brain. These results will be called to the ordering clinician or representative by the Radiologist Assistant, and communication documented in the PACS or [employer]. Electronically Signed   By: Wiliam Ke M.D.   On: 01/04/2023 23:18   CT Head Wo Contrast  Result Date: 01/04/2023 CLINICAL DATA:  Transient ischemic attack (TIA) EXAM: CT HEAD WITHOUT CONTRAST TECHNIQUE: Contiguous axial images were obtained from the  base of the skull through the vertex without intravenous contrast. RADIATION DOSE REDUCTION: This exam was performed according to the departmental dose-optimization program which includes automated exposure control, adjustment of the mA and/or kV according to patient size and/or use of iterative reconstruction technique. COMPARISON:  None Available. FINDINGS: Brain: Scratch no intracranial hemorrhage, mass effect, or midline shift. Generalized atrophy, not unexpected for age. No hydrocephalus. The basilar cisterns are patent. No evidence of territorial infarct or acute ischemia. No extra-axial or intracranial fluid collection. Vascular: Atherosclerosis of skullbase vasculature without hyperdense vessel or abnormal calcification. Skull: No fracture or focal lesion. Sinuses/Orbits: Paranasal sinuses and mastoid air cells are clear. The visualized orbits are unremarkable. Bilateral cataract resection. Other: None. IMPRESSION: 1. No acute intracranial abnormality. 2. Generalized atrophy, not unexpected for age. Electronically Signed   By: Narda Rutherford M.D.   On: 01/04/2023 16:29   DG Chest Port 1 View  Result Date: 01/04/2023 CLINICAL DATA:  Cough, shortness of breath. EXAM: PORTABLE CHEST 1 VIEW COMPARISON:  September 21, 2020. FINDINGS: The heart size and mediastinal contours are within normal limits. Postsurgical changes are seen in the left mid lung consistent with prior tumor resection. Bibasilar atelectasis or scarring is noted. The visualized skeletal structures are unremarkable. IMPRESSION: Postsurgical changes seen in left mid lung. Bibasilar subsegmental atelectasis or scarring. Electronically Signed   By: Lupita Raider M.D.   On: 01/04/2023 15:34    PHYSICAL EXAM Constitutional: Appears well-developed and well-nourished.  Psych: Affect appropriate to situation, calm and cooperative with exam Eyes: No scleral injection, wears eyeglasses HENT: No OP obstrucion MSK: no joint deformities or  swelling Cardiovascular: Normal rate and regular rhythm.  Respiratory: Effort normal, non-labored breathing, nasal cannula in place GI: Soft.  No distension. There is no tenderness.  Skin: WDI  Neuro: Mental Status: Patient is awake, alert, oriented to person, place, month, year, and situation. Patient is able to give a clear and coherent history. No signs of aphasia or neglect.  Speech is clear without dysarthria. Naming repetition comprehension and fluency intact. Patient follows commands without difficulty. Cranial Nerves: II: Right eye central vision impairment due to chronic macular degeneration.  Right eye peripheral vision intact, impaired inferior visual field on the right.  Patient complains of intermittent left eye blurry vision with simultaneous eye burning and watering bilaterally. III,IV, VI: EOMI without gaze preference, nystagmus V: Facial sensation is intact and symmetric to light touch VII: Facial movement is symmetric resting and with movement VIII: Hearing is intact to voice X: Palate elevates symmetrically XI: Shoulder shrug is symmetric. XII: Tongue protrudes midline without atrophy or fasciculations.  Motor: Tone is normal. Bulk is normal. 5/5 strength was present in all four extremities without asymmetry or vertical drift. Sensory: Sensation is symmetric to light touch and temperature in the arms and legs.  Patient does complain of intermittent left arm sleeping sensation that  improved with arm positioning. Cerebellar: FNF and HKS are intact bilaterally  ASSESSMENT/PLAN Alex Edwards is a 88 y.o. male with history of metastatic non-small cell lung cancer s/p chemotherapy, macular degeneration of the right eye, DM2, HLD, HTN, obesity with a BMI of 32.98 kg/m, former smoker presenting with intermittent left eye blurry vision and left arm sensory disturbance with onset Sunday, 01/02/2023.  Patient was not a TNK candidate as his last known well time was unclear  and patient was asymptomatic on hospital arrival.  Stroke: Acute small right frontal small infarct likely secondary to known right ICA stenosis (80-99% right ICA stenosis, asymptomatic 11/19/2020) CT head No acute intracranial abnormality.  Generalized atrophy, not unexpected for age. MRI brain with and without contrast - Acute infarct in the right inferior frontal gyrus.  No evidence of metastatic disease in the brain. CT angio head and neck: Negative for LVO but positive for severe right ICA origin stenosis approaching a radiographic string sign.  Up to 60% stenosis of the left ICA origin.  Aortic atherosclerosis and emphysema 2D Echo LVEF 65-70% LDL 135 HgbA1c 6.6 VTE prophylaxis -Lovenox SQ No antithrombotic prior to admission, now on aspirin 81 mg daily and clopidogrel 75 mg daily (further regimen per vascular surgery team) Therapy recommendations:  pending Disposition:  pending  Carotid stenosis 10/2020 CUS R ICA 80-99% stenosis, left ICA 40-59% stenosis - VVS consult done with Dr. Randie Heinz and stated that pt was not candidate for treatment of asymptomatic carotid lesion. But he lost follow up after. CT angio head and neck this admission positive for severe right ICA origin stenosis approaching a radiographic string sign.  Up to 60% stenosis of the left ICA origin. Vascular surgery consulted for symptomatic right ICA stenosis this time  Hypertension Home meds: Losartan, Lopressor, HCTZ Stable Avoid low BP BP goal 140-160 before carotid revascularization Long-term BP goal normotensive after carotid revascularization  Hyperlipidemia Home meds: None LDL 135, goal < 70 Patient reports intolerance to all statin medications as well as zetia.  Does endorse wanting to try Repatha as his wife takes this medication and receives financial help to obtain this medication.  Does have concerns with starting medication due to cost. Will try to see if pt can take Leqvio  Diabetes type II  Controlled Home meds:  metformin HgbA1c 6.6, goal < 7.0 CBGs SSI Close PCP follow up  Other Stroke Risk Factors Advanced Age >/= 27  Former cigarette smoker Obesity, Body mass index is 32.98 kg/m., BMI >/= 30 associated with increased stroke risk, recommend weight loss, diet and exercise as appropriate   Other Active Problems Emphysema On home oxygen Reported increased shortness of breath and reliance on oxygen at home as of recent Metastatic Stage IV NSCLC with large left upper lobe lung mass dx May 2022 S/p palliative chemotherapy Peripheral neuropathy (presumed from chemotherapy/Keytruda) 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 Pt was seen and examined.   Wife at the bedside. Pt reclining in bed, AAO x 3, neuro intact. MRI showed right frontal cortical small infarct with known right ICA high grade stenosis. It was found 2 years ago but no intervention as it was asymptomatic. But now became symptomatic and will have VVS consult. On DAPT. Not tolerating statin and zetia in the past, will see if he is qualified for [employer]. Will  follow.   For detailed assessment and plan, please refer to above/below as I have made changes wherever appropriate.   Marvel Plan, MD PhD Stroke Neurology 01/05/2023 4:45 PM    To contact Stroke Continuity provider, please refer to [email or web address]. After hours, contact General Neurology

## 2023-01-05 NOTE — Care Management Obs Status (Signed)
MEDICARE OBSERVATION STATUS NOTIFICATION   Patient Details  Name: Alex Edwards MRN: [id number] Date of Birth: 08-09-1935   Medicare Observation Status Notification Given:  Yes    Kermit Balo, RN 01/05/2023, 4:10 PM

## 2023-01-05 NOTE — Consult Note (Signed)
[facility] [place]  ASSESSMENT / PLAN: [name] Edwards is a 87 y.o. male with symptomatic right 80 - 99 % carotid artery stenosis.   Recommend:  Abstinence from all tobacco products. Blood glucose control with goal A1c < 7%. Blood pressure control with goal blood pressure < 140/90 mmHg. Lipid reduction therapy with goal LDL-C <100 mg/dL. Aspirin 81mg  PO QD.  Clopidogrel 75mg  PO QD. Atorvastatin 40-80mg  PO QD (or other "high intensity" statin therapy).  I reviewed options available for the patient including carotid endarterectomy, carotid artery stenting, and medical therapy alone.  The patient's plaque is not amenable to stenting given its circumferential nature.  I do think a carotid endarterectomy would be feasible for this patient.  We reviewed the risk of stroke, cranial nerve injury, and bleeding.  I counseled the patient that given his limited longevity with metastatic lung cancer, it would be very reasonable to pursue nonoperative correction as well.  Patient would like some time to review his options and discuss this further with his family.  Will check back in with him tomorrow.  CHIEF COMPLAINT: stroke  HISTORY OF PRESENT ILLNESS: Alex Edwards is a 87 y.o. male admitted to the internal medicine service for left arm weakness, visual changes, slurred speech.  Patient has a history significant for stage IV non-small cell lung cancer which has had a good response to palliative chemotherapy.  He has never suffered a stroke before.  We reviewed the stroke workup to date including the significant right-sided carotid artery stenosis ipsilateral to the punctate stroke on MRI brain.  We had a long discussion about treatment options including endarterectomy, stenting, and best medical therapy alone for symptomatic carotid artery stenosis.   Past Medical History:  Diagnosis Date   BCE (basal cell epithelioma)    Bruises easily    on hands   Cough    smokers  cough or perfurmes   Degeneration macular    right eye   Diabetes mellitus without complication (HCC)    Dyslipidemia    ED (erectile dysfunction)    Hx of adenomatous colonic polyps    Hyperlipidemia    Hypertension    borderline   Obesity    Psoriasis    Seasonal allergies    Smoker    former    Past Surgical History:  Procedure Laterality Date   BRONCHIAL BRUSHINGS  11/18/2020   Procedure: BRONCHIAL BRUSHINGS;  Surgeon: Charlott Holler, MD;  Location: [facility];  Service: Pulmonary;;   BRONCHIAL NEEDLE ASPIRATION BIOPSY  11/18/2020   Procedure: BRONCHIAL NEEDLE ASPIRATION BIOPSIES;  Surgeon: Charlott Holler, MD;  Location: [facility];  Service: Pulmonary;;   BRONCHIAL WASHINGS  11/18/2020   Procedure: BRONCHIAL WASHINGS;  Surgeon: Charlott Holler, MD;  Location: [facility];  Service: Pulmonary;;   CATARACT EXTRACTION, BILATERAL Bilateral 2013   COLONOSCOPY  2005   [facility]   ENDOBRONCHIAL ULTRASOUND N/A 11/18/2020   Procedure: ENDOBRONCHIAL ULTRASOUND;  Surgeon: Charlott Holler, MD;  Location: [facility];  Service: Pulmonary;  Laterality: N/A;   POLYPECTOMY     SKIN CANCER EXCISION Right 1993   under eye-MOHS, freeze multiple places freq   TONSILLECTOMY  as child   TOTAL HIP ARTHROPLASTY Left 01/04/2014   Procedure: LEFT TOTAL HIP ARTHROPLASTY ANTERIOR APPROACH;  Surgeon: Kathryne Hitch, MD;  Location: [facility];  Service: Orthopedics;  Laterality: Left;   TOTAL HIP ARTHROPLASTY Left 02/14/2014   Procedure: Irrigation and Debridement left hip;  Surgeon:  Kathryne Hitch, MD;  Location: [facility];  Service: Orthopedics;  Laterality: Left;    Family History  Problem Relation Age of Onset   Heart disease Brother     Social History   Socioeconomic History   Marital status: Married    Spouse name: Not on file   Number of children: Not on file   Years of education: Not on file   Highest education level: Not on file  Occupational History   Not on file   Tobacco Use   Smoking status: Former    Packs/day: 1.00    Years: 50.00    Additional pack years: 0.00    Total pack years: 50.00    Types: Cigarettes    Quit date: 04/28/2014    Years since quitting: 8.6   Smokeless tobacco: Never  Vaping Use   Vaping Use: Never used  Substance and Sexual Activity   Alcohol use: No    Alcohol/week: 0.0 standard drinks of alcohol   Drug use: No   Sexual activity: Yes  Other Topics Concern   Not on file  Social History Narrative   Not on file   Social Determinants of Health   Financial Resource Strain: Medium Risk (08/24/2022)   Overall Financial Resource Strain (CARDIA)    Difficulty of Paying Living Expenses: Somewhat hard  Food Insecurity: No Food Insecurity (01/05/2023)   Hunger Vital Sign    Worried About Running Out of Food in the Last Year: Never true    Ran Out of Food in the Last Year: Never true  Transportation Needs: No Transportation Needs (01/05/2023)   PRAPARE - Administrator, Civil Service (Medical): No    Lack of Transportation (Non-Medical): No  Physical Activity: Insufficiently Active (08/24/2022)   Exercise Vital Sign    Days of Exercise per Week: 3 days    Minutes of Exercise per Session: 20 min  Stress: No Stress Concern Present (08/24/2022)   [employer] of Occupational Health - Occupational Stress Questionnaire    Feeling of Stress : Not at all  Social Connections: Not on file  Intimate Partner Violence: Not At Risk (01/05/2023)   Humiliation, Afraid, Rape, and Kick questionnaire    Fear of Current or Ex-Partner: No    Emotionally Abused: No    Physically Abused: No    Sexually Abused: No    Allergies  Allergen Reactions   Paclitaxel Shortness Of Breath    SOB and light headedness, flushing and 8/10 pain in left flank Pepcid and Solumedrol administered.  Able to tolerate remainder of Taxol infusion   Vancomycin     Red Man Syndrome    Amlodipine Swelling   Latex Other (See Comments)     "possibly allergic- dry, itching, rash"    Current Facility-Administered Medications  Medication Dose Route Frequency Provider Last Rate Last Admin   acetaminophen (TYLENOL) tablet 650 mg  650 mg Oral Q4H PRN Synetta Fail, MD       Or   acetaminophen (TYLENOL) 160 MG/5ML solution 650 mg  650 mg Per Tube Q4H PRN Synetta Fail, MD       Or   acetaminophen (TYLENOL) suppository 650 mg  650 mg Rectal Q4H PRN Synetta Fail, MD       alum & mag hydroxide-simeth (MAALOX/MYLANTA) 200-200-20 MG/5ML suspension 30 mL  30 mL Oral Q4H PRN Synetta Fail, MD   30 mL at 01/05/23 1234   aspirin EC tablet 81 mg  81 mg  Oral Daily Kara Mead, NP   81 mg at 01/05/23 0935   clopidogrel (PLAVIX) tablet 75 mg  75 mg Oral Daily Milon Dikes, MD   75 mg at 01/05/23 0931   enoxaparin (LOVENOX) injection 40 mg  40 mg Subcutaneous Q24H Synetta Fail, MD   40 mg at 01/04/23 1948   insulin aspart (novoLOG) injection 0-15 Units  0-15 Units Subcutaneous TID WC Synetta Fail, MD   2 Units at 01/05/23 1228   ipratropium-albuterol (DUONEB) 0.5-2.5 (3) MG/3ML nebulizer solution 3 mL  3 mL Nebulization Q6H PRN Synetta Fail, MD   3 mL at 01/05/23 1012   levothyroxine (SYNTHROID) tablet 50 mcg  50 mcg Oral QAC breakfast Synetta Fail, MD   50 mcg at 01/05/23 4098   predniSONE (DELTASONE) tablet 5 mg  5 mg Oral Q breakfast Synetta Fail, MD   5 mg at 01/05/23 1191   senna-docusate (Senokot-S) tablet 1 tablet  1 tablet Oral QHS PRN Synetta Fail, MD       tamsulosin [facility]) capsule 0.4 mg  0.4 mg Oral QPC supper Hillary Bow, DO   0.4 mg at 01/04/23 2054    PHYSICAL EXAM Vitals:   01/05/23 0336 01/05/23 0735 01/05/23 1019 01/05/23 1108  BP: (!) 97/42 (!) 130/59  (!) 153/68  Pulse: 96 94  (!) 102  Resp: 18 20  20   Temp: 98.4 F (36.9 C) 97.6 F (36.4 C)  98.2 F (36.8 C)  TempSrc: Oral Oral  Oral  SpO2: 90% 97% 96% 97%  Weight:      Height:        Elderly man in no distress Regular rate and rhythm Unlabored breathing No facial droop No weakness in the extremities  PERTINENT LABORATORY AND RADIOLOGIC DATA  Most recent CBC    Latest Ref Rng & Units 01/04/2023    3:02 PM 01/04/2023    2:35 PM 07/01/2022   11:14 AM  CBC  WBC 4.0 - 10.5 K/uL  7.1  7.8   Hemoglobin 13.0 - 17.0 g/dL 47.8  29.5  62.1   Hematocrit 39.0 - 52.0 % 36.0  35.8  37.2   Platelets 150 - 400 K/uL  220  219      Most recent CMP    Latest Ref Rng & Units 01/04/2023    3:02 PM 01/04/2023    2:35 PM 07/01/2022   11:14 AM  CMP  Glucose 70 - 99 mg/dL 308  657  846   BUN 8 - 23 mg/dL 23  18  20    Creatinine 0.61 - 1.24 mg/dL 9.62  9.52  8.41   Sodium 135 - 145 mmol/L 137  137  138   Potassium 3.5 - 5.1 mmol/L 4.1  4.0  4.4   Chloride 98 - 111 mmol/L 99  100  101   CO2 22 - 32 mmol/L  28  30   Calcium 8.9 - 10.3 mg/dL  9.3  9.5   Total Protein 6.5 - 8.1 g/dL  6.6  6.7   Total Bilirubin 0.3 - 1.2 mg/dL  0.5  0.5   Alkaline Phos 38 - 126 U/L  46  63   AST 15 - 41 U/L  15  13   ALT 0 - 44 U/L  12  11     Renal function Estimated Creatinine Clearance: 50.9 mL/min (A) (by C-G formula based on SCr of 1.4 mg/dL (H)).  Hgb A1c MFr Bld (%)  Date Value  01/05/2023 6.6 (H)    LDL Chol Calc (NIH)  Date Value Ref Range Status  08/25/2022 128 (H) 0 - 99 mg/dL Final   LDL Cholesterol  Date Value Ref Range Status  01/05/2023 135 (H) 0 - 99 mg/dL Final    Comment:           Total Cholesterol/HDL:CHD Risk Coronary Heart Disease Risk Table                     Men   Women  1/2 Average Risk   3.4   3.3  Average Risk       5.0   4.4  2 X Average Risk   9.6   7.1  3 X Average Risk  23.4   11.0        Use the calculated Patient Ratio above and the CHD Risk Table to determine the patient's CHD Risk.        ATP III CLASSIFICATION (LDL):  <100     mg/dL   Optimal  161-096  mg/dL   Near or Above                    Optimal  130-159  mg/dL   Borderline  045-409   mg/dL   High  >811     mg/dL   Very High Performed at [facility], [address], [place], [place] [address]     CT angiogram head and neck Focal critical stenosis of the right internal carotid artery at its origin  MRI brain right inferior frontal gyrus stroke  Rande Brunt. Lenell Antu, MD FACS [facility] Phone Number: [phone] 01/05/2023 1:12 PM   Total time spent on preparing this encounter including chart review, data review, collecting history, examining the patient, coordinating care for this new patient, 60 minutes.  Portions of this report may have been transcribed using voice recognition software.  Every effort has been made to ensure accuracy; however, inadvertent computerized transcription errors may still be present.

## 2023-01-05 NOTE — Assessment & Plan Note (Signed)
BMI 32.98

## 2023-01-05 NOTE — Hospital Course (Addendum)
[name] is an 87 y.o. 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

## 2023-01-05 NOTE — Assessment & Plan Note (Signed)
Not on statin prior to admission.  LDL 130s, now on lipitor - Continue Lipitor

## 2023-01-05 NOTE — TOC Initial Note (Signed)
Transition of Care [facility]) - Initial/Assessment Note    Patient Details  Name: Alex Edwards MRN: [id number] Date of Birth: September 11, 1935  Transition of Care [facility]) CM/SW Contact:    Kermit Balo, RN Phone Number: 01/05/2023, 4:23 PM  Clinical Narrative:                 Pt is from home with his spouse. They are together most of the time.  Pt is active with [employer] for home oxygen 3-3.5L [place]. He is hoping to qualify for inogen through [employer].  Pt manages his own medications and denies any issues.  Pt drives self as needed but spouse can provide transportation as needed.  Wife will transport home when medically ready. TOC following.  Expected Discharge Plan: Home w Home Health Services Barriers to Discharge: Continued Medical Work up   Patient Goals and CMS Choice   CMS Medicare.gov Compare Post Acute Care list provided to:: Patient Choice offered to / list presented to : Patient      Expected Discharge Plan and Services   Discharge Planning Services: CM Consult Post Acute Care Choice: Home Health Living arrangements for the past 2 months: Single Family Home                           HH Arranged: PT, OT HH Agency: [facility] Date [facility] Agency Contacted: 01/05/23   Representative spoke with at [facility] Agency: Kandee Keen  Prior Living Arrangements/Services Living arrangements for the past 2 months: Single Family Home Lives with:: Spouse Patient language and need for interpreter reviewed:: Yes Do you feel safe going back to the place where you live?: Yes        Care giver support system in place?: Yes (comment) Current home services: DME (walker/ cane/ oxygen) Criminal Activity/Legal Involvement Pertinent to Current Situation/Hospitalization: No - Comment as needed  Activities of Daily Living Home Assistive Devices/Equipment: None ADL Screening (condition at time of admission) Patient's cognitive ability adequate to safely complete daily activities?: Yes Is the  patient deaf or have difficulty hearing?: No Does the patient have difficulty seeing, even when wearing glasses/contacts?: No Does the patient have difficulty concentrating, remembering, or making decisions?: No Patient able to express need for assistance with ADLs?: Yes Does the patient have difficulty dressing or bathing?: No Independently performs ADLs?: Yes (appropriate for developmental age) Does the patient have difficulty walking or climbing stairs?: No Weakness of Legs: None Weakness of Arms/Hands: None  Permission Sought/Granted                  Emotional Assessment Appearance:: Appears stated age Attitude/Demeanor/Rapport: Engaged Affect (typically observed): Accepting Orientation: : Oriented to Self, Oriented to Place, Oriented to  Time, Oriented to Situation   Psych Involvement: No (comment)  Admission diagnosis:  TIA (transient ischemic attack) [G45.9] Patient Active Problem List   Diagnosis Date Noted   Hypothyroidism 01/05/2023   Polymyalgia rheumatica (HCC) 01/05/2023   Acute ischemic stroke (HCC) 01/04/2023   Neuropathy associated with cancer (HCC) 08/25/2022   COVID-19 07/27/2022   Itching 06/30/2021   Encounter for antineoplastic immunotherapy 01/12/2021   Left leg swelling 12/22/2020   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) 12/12/2020   Encounter for antineoplastic chemotherapy 12/04/2020   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   Internal carotid artery stenosis, right 11/21/2020   Aortic atherosclerosis (HCC) 02/14/2018   Onychomycosis 09/26/2017   Macular degeneration 01/25/2017   History of cataract  surgery 01/25/2017   Statin intolerance 09/22/2016   Controlled type 2 diabetes mellitus without complication, without long-term current use of insulin (HCC) 09/22/2016   Senile purpura (HCC) 01/13/2015   Hx of adenomatous colonic polyps 01/13/2015   Actinic keratosis of multiple sites of head and neck 12/09/2014   Status post THR (total hip  replacement) 01/04/2014   Hypertension associated with diabetes (HCC) 10/14/2011   Hyperlipidemia associated with type 2 diabetes mellitus (HCC) 10/14/2011   Psoriasis 10/14/2011   Obesity (BMI 30-39.9) 10/14/2011   Allergic rhinitis due to pollen 10/14/2011   Former smoker 10/14/2011   PCP:  Ronnald Nian, MD Pharmacy:   [facility] [place], [place] - [address] [place] [address] Phone: [phone] Fax: [phone]  [id number] - [place], [place] - [address] [facility] [address] [place] [address] Phone: [phone] Fax: [phone]     Social Determinants of Health (SDOH) Social History: SDOH Screenings   Food Insecurity: No Food Insecurity (01/05/2023)  Housing: Low Risk  (01/05/2023)  Transportation Needs: No Transportation Needs (01/05/2023)  Utilities: Not At Risk (01/05/2023)  Depression (PHQ2-9): Medium Risk (08/24/2022)  Financial Resource Strain: Medium Risk (08/24/2022)  Physical Activity: Insufficiently Active (08/24/2022)  Stress: No Stress Concern Present (08/24/2022)  Tobacco Use: Medium Risk (01/04/2023)   SDOH Interventions:     Readmission Risk Interventions     No data to display

## 2023-01-05 NOTE — Assessment & Plan Note (Signed)
Glucoses controlled - Hold metoformin - Continue SS corrections

## 2023-01-05 NOTE — Assessment & Plan Note (Signed)
Continue levothyroxine 

## 2023-01-05 NOTE — Assessment & Plan Note (Signed)
-  Continue prednisone 

## 2023-01-05 NOTE — Assessment & Plan Note (Signed)
Was on palliative keytruda until Jan, now off due to neuropathy.   - Follow up with Oncology after discharge

## 2023-01-05 NOTE — Assessment & Plan Note (Signed)
-   Hold metop, HCT, losartan for permissive HTN

## 2023-01-05 NOTE — Assessment & Plan Note (Addendum)
MRI brain showed acute stroke in right inferior frontal gyrus - Non-invasive angiography showed right carotid 80-99% stenosis. - Echocardiogram showed no cardiogenic source of embolism - LDL 135, on atorvastatin - Aspirin and Plavix ordered - Atrial fibrillation: not on monitoring - tPA not given because symptoms resolving - Dysphagia screen ordered in ER - PT eval ordered: no PT follow up - Nonsmoker  - VVS consulted, patient to consider CEA Right overnight

## 2023-01-05 NOTE — Progress Notes (Signed)
Progress Note   Patient: Alex Edwards Sr ZOX:[id number] DOB: Apr 28, 1936 DOA: 01/04/2023     0 DOS: the patient was seen and examined on 01/05/2023 at 9:57AM      Brief hospital course: [name] is an 87 y.o. 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     Assessment and Plan: * Acute ischemic stroke (HCC) MRI brain showed acute stroke in right inferior frontal gyrus - Non-invasive angiography showed right carotid 80-99% stenosis. 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   - Follow up with Oncology after discharge  Controlled type 2 diabetes mellitus without complication, without long-term current use of insulin (HCC) Glucoses controlled - Hold metoformin - Continue SS corrections  Obesity (BMI 30-39.9) BMI 32.98  Hyperlipidemia associated with type 2 diabetes mellitus (HCC) Not on statin prior to admission.  LDL 130s, now on lipitor - Continue Lipitor  Hypertension associated with diabetes (HCC) - Hold metop, HCT, losartan for permissive HTN          Subjective: Patient still has some tingling in the left arm, still has  blurry vision.  No new focal weakness, speech disturbance.     Physical Exam: BP (!) 153/68 (BP Location: Right Arm)   Pulse (!) 102   Temp 98.2 F (36.8 C) (Oral)   Resp 20   Ht 6\' 1"  (1.854 m)   Wt 113.4 kg   SpO2 97%   BMI 32.98 kg/m   Elderly adult male, slightly hard of hearing, sitting up in bed, interactive and appropriate Anicteric, conjunctiva pink, lids and lashes normal RRR, no murmurs, no peripheral edema Respiratory rate normal, lungs clear without rales or wheezes Abdomen soft without tenderness palpation or guarding Extraocular movements are intact, without nystagmus.  Cranial nerve 5 is within normal limits.  Cranial nerve 7 is symmetrical.  Cranial nerve 8 has some hearing impairemnt.  Motor strength testing is 5/5 in the upper and lower extremities bilaterally with normal motor, tone and bulk.  Sensory examination is intact to light touch.  Romberg maneuver is negative for pathology.  Heel to shin testing is within normal limits. The patient is oriented to time, place and person.  Speech is fluent.  Naming is grossly intact.  Recall, recent and remote, as well as general fund of knowledge seem within normal limits.  Attention span and concentration are within normal limits.   Data Reviewed: Discussed with neurology team and vascular surgery LDL 135 MRI brain shows right frontal infarct Hemoglobin A1c 6.6% COVID-negative   Family Communication: Wife at the bedside    Disposition: Status is: Observation The patient was admitted with stroke from symptomatic carotid stenosis.  Vascular surgery were consulted and  will plan for srugery         Author: Alberteen Sam, MD 01/05/2023 4:20 PM  For on call review [email or web address].

## 2023-01-05 NOTE — Assessment & Plan Note (Signed)
-   Consult Vascular surgery, appreciate cares ?

## 2023-01-06 ENCOUNTER — Other Ambulatory Visit: Payer: Self-pay

## 2023-01-06 DIAGNOSIS — M353 Polymyalgia rheumatica: Secondary | ICD-10-CM | POA: Diagnosis not present

## 2023-01-06 DIAGNOSIS — I639 Cerebral infarction, unspecified: Secondary | ICD-10-CM | POA: Diagnosis not present

## 2023-01-06 DIAGNOSIS — I152 Hypertension secondary to endocrine disorders: Secondary | ICD-10-CM | POA: Diagnosis not present

## 2023-01-06 DIAGNOSIS — G63 Polyneuropathy in diseases classified elsewhere: Secondary | ICD-10-CM | POA: Diagnosis not present

## 2023-01-06 DIAGNOSIS — E1159 Type 2 diabetes mellitus with other circulatory complications: Secondary | ICD-10-CM | POA: Diagnosis not present

## 2023-01-06 DIAGNOSIS — I6521 Occlusion and stenosis of right carotid artery: Secondary | ICD-10-CM | POA: Diagnosis not present

## 2023-01-06 DIAGNOSIS — E785 Hyperlipidemia, unspecified: Secondary | ICD-10-CM | POA: Diagnosis not present

## 2023-01-06 DIAGNOSIS — C3492 Malignant neoplasm of unspecified part of left bronchus or lung: Secondary | ICD-10-CM | POA: Diagnosis not present

## 2023-01-06 DIAGNOSIS — C801 Malignant (primary) neoplasm, unspecified: Secondary | ICD-10-CM | POA: Diagnosis not present

## 2023-01-06 LAB — CBC
HCT: 35.6 % — ABNORMAL LOW (ref 39.0–52.0)
Hemoglobin: 11.6 g/dL — ABNORMAL LOW (ref 13.0–17.0)
MCH: 31.8 pg (ref 26.0–34.0)
MCHC: 32.6 g/dL (ref 30.0–36.0)
MCV: 97.5 fL (ref 80.0–100.0)
Platelets: 214 10*3/uL (ref 150–400)
RBC: 3.65 MIL/uL — ABNORMAL LOW (ref 4.22–5.81)
RDW: 12.3 % (ref 11.5–15.5)
WBC: 7.2 10*3/uL (ref 4.0–10.5)
nRBC: 0 % (ref 0.0–0.2)

## 2023-01-06 LAB — BASIC METABOLIC PANEL
Anion gap: 9 (ref 5–15)
BUN: 17 mg/dL (ref 8–23)
CO2: 27 mmol/L (ref 22–32)
Calcium: 8.9 mg/dL (ref 8.9–10.3)
Chloride: 100 mmol/L (ref 98–111)
Creatinine, Ser: 1.37 mg/dL — ABNORMAL HIGH (ref 0.61–1.24)
GFR, Estimated: 51 mL/min — ABNORMAL LOW (ref >60)
Glucose, Bld: 148 mg/dL — ABNORMAL HIGH (ref 70–99)
Potassium: 4.2 mmol/L (ref 3.5–5.1)
Sodium: 136 mmol/L (ref 135–145)

## 2023-01-06 LAB — GLUCOSE, CAPILLARY
Glucose-Capillary: 129 mg/dL — ABNORMAL HIGH (ref 70–99)
Glucose-Capillary: 136 mg/dL — ABNORMAL HIGH (ref 70–99)

## 2023-01-06 MED ORDER — ROSUVASTATIN CALCIUM 10 MG PO TABS: 10.0000 mg | ORAL_TABLET | Freq: Every day | ORAL | 1 refills | Status: DC

## 2023-01-06 MED ORDER — CLOPIDOGREL BISULFATE 75 MG PO TABS: 75.0000 mg | ORAL_TABLET | Freq: Every day | ORAL | 2 refills | Status: DC

## 2023-01-06 MED ORDER — ASPIRIN 81 MG PO TBEC: 81.0000 mg | DELAYED_RELEASE_TABLET | Freq: Every day | ORAL | 12 refills | Status: DC

## 2023-01-06 NOTE — Discharge Summary (Signed)
Physician Discharge Summary   Patient: Alex Edwards MRN: [id number] DOB: 04/27/1936  Admit date:     01/04/2023  Discharge date: 01/06/23  Discharge Physician: Alberteen Sam   PCP: Ronnald Nian, MD   Recommendations at discharge:  Follow up with Vascular Surgery in 5 days for right CEA Follow up with Neurology in 6-8 weeks for Stroke, referral sent Check lipids in 3 months on new statin and Leqvio Follow up with Oncology as soon as able for CT chest for surveillance     Discharge Diagnoses: Principal Problem:   Acute ischemic stroke [facility]) Active Problems:   Internal carotid artery stenosis, right   Hypertension associated with diabetes (HCC)   Hyperlipidemia associated with type 2 diabetes mellitus (HCC)   Obesity (BMI 30-39.9)   Controlled type 2 diabetes mellitus without complication, without long-term current use of insulin (HCC)   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       * Acute ischemic stroke (HCC) MRI brain showed acute stroke in right inferior frontal gyrus.  Non-invasive angiography showed right carotid 80-99% stenosis.  Echocardiogram showed no cardiogenic source of embolism LDL 135, started on statin, tolerated here.  Thought he could possibly tolerate outpatient, was willing to try again.  In meantime, Neurology have submitted approval for Leqvio.  Aspirin and Plavix started  Atrial fibrillation: not on monitoring.  tPA not given because symptoms resolving by presentation    Symptomatic Internal carotid artery stenosis, right Neurology  recommended vascular surgery consultation for symptomatic carotid stenosis.     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  Was to have follow up CT chest this month, patient pushed this back to October he says. - Follow up with Oncology after discharge  Controlled type 2 diabetes mellitus without complication, without long-term current use of insulin (HCC) Glucoses controlled  Obesity (BMI 30-39.9) BMI 32.98  Hyperlipidemia associated with type 2 diabetes mellitus (HCC) Not on statin prior to admission.  LDL 130s.  See above.  Crestor and Leqvio.  Hypertension associated with diabetes [facility])         Consultants: Neurology   Disposition: Home health Diet recommendation:  Discharge Diet Orders (From admission, onward)     Start     Ordered   01/06/23 0000  Diet - low sodium heart healthy        01/06/23 1303            DISCHARGE MEDICATION: Allergies as of 01/06/2023       Reactions   Paclitaxel Shortness Of Breath   SOB and light headedness, flushing and 8/10 pain in left flank Pepcid and Solumedrol administered.  Able to tolerate remainder of Taxol infusion   Vancomycin    Red Man Syndrome   Amlodipine Swelling   Latex Other (See Comments)   "possibly allergic- dry, itching, rash"  Medication List     STOP taking these medications    hydrochlorothiazide 12.5 MG capsule Commonly known as: MICROZIDE   losartan 100 MG tablet Commonly known as: COZAAR       TAKE these medications    aspirin EC 81 MG tablet Take 1 tablet (81 mg total) by mouth daily. Swallow whole. Start taking on: January 07, 2023   clopidogrel 75 MG tablet Commonly known as: PLAVIX Take 1 tablet (75 mg total) by mouth daily. Start taking on: January 07, 2023    ketoconazole 2 % cream Commonly known as: NIZORAL Apply topically daily.   levothyroxine 50 MCG tablet Commonly known as: SYNTHROID Take 1 tablet (50 mcg total) by mouth daily before breakfast.   loratadine 10 MG tablet Commonly known as: CLARITIN Take 10 mg by mouth daily.   metFORMIN 750 MG 24 hr tablet Commonly known as: GLUCOPHAGE-XR Take 1 tablet (750 mg total) by mouth daily with breakfast.   metoprolol tartrate 50 MG tablet Commonly known as: LOPRESSOR TAKE ONE TABLET BY MOUTH TWICE DAILY   OneTouch Delica Lancets Fine Misc Patient is to test one time a day DX: E11.9   OneTouch Verio test strip Generic drug: glucose blood USE TO TEST BLOOD SUGAR ONCE DAILY.   predniSONE 5 MG tablet Commonly known as: DELTASONE Take 1 tablet (5 mg total) by mouth daily with breakfast.   rosuvastatin 10 MG tablet Commonly known as: Crestor Take 1 tablet (10 mg total) by mouth at bedtime.   tamsulosin 0.4 MG Caps capsule Commonly known as: FLOMAX Take 1 capsule (0.4 mg total) by mouth daily after supper.   triamcinolone cream 0.1 % Commonly known as: KENALOG APPLY TO AFFECTED AREA TWICE A DAY.   vitamin C 1000 MG tablet Take 1,000 mg by mouth daily.   Vitamin D-3 125 MCG (5000 UT) Tabs Take 1 tablet by mouth every other day.        Follow-up Information     [facility], [facility] Follow up.   Specialty: Home Health Services Why: The home health agency will contact you for the first home visit. Contact information: [address] [place] [address] [phone]         [facility] at [facility]. Call.   Specialty: Vascular Surgery Why: To Dr Verita Lamb office if questions Contact information: [address] [place] [address] [phone]        Ronnald Nian, MD. Schedule an appointment as soon as possible for a visit in 1 week(s).   Specialty: Family Medicine Contact information: [address] [place] [address] [phone]         [facility]. Schedule an appointment as soon as possible for a visit in 1 month(s).   Contact information: [address]     [address] [place] [address] [phone]               Discharge Exam: Ceasar Mons Weights   01/04/23 1403  Weight: 113.4 kg   General:  Pt is alert, awake, not in acute distress Cardiovascular: RRR, nl S1-S2, no murmurs appreciated.   No LE edema.   Respiratory: Normal respiratory rate and rhythm.  CTAB without rales or wheezes. Abdominal: Abdomen soft and non-tender.  No distension or HSM.   Neuro/Psych: Strength symmetric in upper and lower extremities.  Judgment and insight appear normal .   Condition at discharge: stable  The results of significant diagnostics from this  hospitalization (including imaging, microbiology, ancillary and laboratory) are listed below for reference.   Imaging Studies: ECHOCARDIOGRAM COMPLETE  Result Date: 01/05/2023    ECHOCARDIOGRAM REPORT   Patient Name:   LONNY EISEN SR Date of Exam: 01/05/2023 Medical Rec #:  [id number]       Height:       73.0 in Accession #:    [id number]      Weight:       250.0 lb Date of Birth:  April 20, 1936        BSA:          2.366 m Patient Age:    87 years        BP:           130/59 mmHg Patient Gender: M               HR:           93 bpm. Exam Location:  Inpatient Procedure: 2D Echo, Cardiac Doppler and Color Doppler Indications:    TIA  History:        Patient has no prior history of Echocardiogram examinations.                 Carotid Disease, cancer and TIA; Risk Factors:Hypertension,                 Diabetes and Former Smoker.  Sonographer:    Wallie Char Referring Phys: [id number] ALEXANDER B MELVIN IMPRESSIONS  1. LV function is vigorous with systolic obliteration of LV cavity and narrow LVOT. No SAM. Mild late LVOT gradient at . Left ventricular ejection fraction, by estimation, is 65  to 70%. The left ventricle has normal function. The left ventricle has no regional wall motion abnormalities. Left ventricular diastolic parameters are consistent with Grade I diastolic dysfunction (impaired relaxation).  2. Right ventricular systolic function is normal. The right ventricular size is normal.  3. The mitral valve is normal in structure. Trivial mitral valve regurgitation. No evidence of mitral stenosis. Moderate mitral annular calcification.  4. The aortic valve is tricuspid. There is mild calcification of the aortic valve. Aortic valve regurgitation is not visualized. Aortic valve sclerosis/calcification is present, without any evidence of aortic stenosis.  5. The inferior vena cava is normal in size with greater than 50% respiratory variability, suggesting right atrial pressure of 3 mmHg. FINDINGS  Left Ventricle: LV function is vigorous with systolic obliteration of LV cavity and narrow LVOT. No SAM. Mild late LVOT gradient at . Left ventricular ejection fraction, by estimation, is 65 to 70%. The left ventricle has normal function. The left  ventricle has no regional wall motion abnormalities. The left ventricular internal cavity size was normal in size. There is no left ventricular hypertrophy. Left ventricular diastolic parameters are consistent with Grade I diastolic dysfunction (impaired relaxation). Right Ventricle: The right ventricular size is normal. No increase in right ventricular wall thickness. Right ventricular systolic function is normal. Left Atrium: Left atrial size was normal in size. Right Atrium: Right atrial size was normal in size. Pericardium: There is no evidence of pericardial effusion. Mitral Valve: The mitral valve is normal in structure. Moderate mitral annular calcification. Trivial mitral valve regurgitation. No evidence of mitral valve stenosis. MV peak gradient, 9.5 mmHg. The mean mitral valve gradient is 5.0 mmHg. Tricuspid Valve: The tricuspid valve is normal  in structure. Tricuspid valve regurgitation is trivial. No evidence of tricuspid stenosis. Aortic Valve: The aortic valve is tricuspid. There is mild calcification  of the aortic valve. Aortic valve regurgitation is not visualized. Aortic valve sclerosis/calcification is present, without any evidence of aortic stenosis. Aortic valve mean gradient measures 11.0 mmHg. Aortic valve peak gradient measures 18.5 mmHg. Aortic valve area, by VTI measures 2.39 cm. Pulmonic Valve: The pulmonic valve was normal in structure. Pulmonic valve regurgitation is not visualized. No evidence of pulmonic stenosis. Aorta: The aortic root is normal in size and structure. Venous: The inferior vena cava is normal in size with greater than 50% respiratory variability, suggesting right atrial pressure of 3 mmHg. IAS/Shunts: No atrial level shunt detected by color flow Doppler.  LEFT VENTRICLE PLAX 2D LVIDd:         4.80 cm      Diastology LVIDs:         3.00 cm      LV e' medial:    5.49 cm/s LV PW:         1.10 cm      LV E/e' medial:  16.2 LV IVS:        0.80 cm      LV e' lateral:   7.43 cm/s LVOT diam:     2.20 cm      LV E/e' lateral: 12.0 LV SV:         98 LV SV Index:   41 LVOT Area:     3.80 cm  LV Volumes (MOD) LV vol d, MOD A2C: 93.9 ml LV vol d, MOD A4C: 107.0 ml LV vol s, MOD A2C: 29.5 ml LV vol s, MOD A4C: 33.5 ml LV SV MOD A2C:     64.4 ml LV SV MOD A4C:     107.0 ml LV SV MOD BP:      68.9 ml RIGHT VENTRICLE             IVC RV Basal diam:  3.70 cm     IVC diam: 1.50 cm RV S prime:     21.20 cm/s TAPSE (M-mode): 2.4 cm LEFT ATRIUM             Index        RIGHT ATRIUM           Index LA diam:        3.80 cm 1.61 cm/m   RA Area:     14.50 cm LA Vol (A2C):   45.5 ml 19.23 ml/m  RA Volume:   37.60 ml  15.89 ml/m LA Vol (A4C):   44.4 ml 18.77 ml/m LA Biplane Vol: 46.9 ml 19.83 ml/m  AORTIC VALVE AV Area (Vmax):    2.35 cm AV Area (Vmean):   2.36 cm AV Area (VTI):     2.39 cm AV Vmax:           215.00 cm/s AV Vmean:           158.000 cm/s AV VTI:            0.408 m AV Peak Grad:      18.5 mmHg AV Mean Grad:      11.0 mmHg LVOT Vmax:         133.00 cm/s LVOT Vmean:        98.200 cm/s LVOT VTI:          0.256 m LVOT/AV VTI ratio: 0.63  AORTA Ao Root diam: 3.40 cm Ao Asc diam:  3.60 cm MITRAL VALVE                TRICUSPID VALVE MV Area (  PHT): 2.04 cm     TR Peak grad:   16.8 mmHg MV Area VTI:   2.91 cm     TR Vmax:        205.00 cm/s MV Peak grad:  9.5 mmHg MV Mean grad:  5.0 mmHg     SHUNTS MV Vmax:       1.54 m/s     Systemic VTI:  0.26 m MV Vmean:      98.9 cm/s    Systemic Diam: 2.20 cm MV Decel Time: 371 msec MV E velocity: 89.10 cm/s MV A velocity: 179.00 cm/s MV E/A ratio:  0.50 Arvilla Meres MD Electronically signed by Arvilla Meres MD Signature Date/Time: 01/05/2023/10:39:43 AM    Final    CT ANGIO HEAD NECK W WO CM  Result Date: 01/05/2023 CLINICAL DATA:  87 year old male with TIA symptoms. MRI yesterday reveals small right MCA operculum infarct. History of non-small cell lung cancer. Left parotid region mass. EXAM: CT ANGIOGRAPHY HEAD AND NECK WITH AND WITHOUT CONTRAST TECHNIQUE: Multidetector CT imaging of the head and neck was performed using the standard protocol during bolus administration of intravenous contrast. Multiplanar CT image reconstructions and MIPs were obtained to evaluate the vascular anatomy. Carotid stenosis measurements (when applicable) are obtained utilizing NASCET criteria, using the distal internal carotid diameter as the denominator. RADIATION DOSE REDUCTION: This exam was performed according to the departmental dose-optimization program which includes automated exposure control, adjustment of the mA and/or kV according to patient size and/or use of iterative reconstruction technique. CONTRAST:  75mL OMNIPAQUE IOHEXOL 350 MG/ML SOLN COMPARISON:  Brain MRI and head CT yesterday.  CT chest 07/01/2022. Neck CT 11/19/2020. FINDINGS: CT HEAD Brain: Small right frontal operculum cortical infarct  largely occult by CT. No acute intracranial hemorrhage identified. No midline shift, mass effect, or evidence of intracranial mass lesion. No ventriculomegaly. Stable gray-white differentiation elsewhere. Calvarium and skull base: No acute osseous abnormality identified. Paranasal sinuses: Visualized paranasal sinuses and mastoids are stable and well aerated. Orbits: No acute orbit or scalp soft tissue finding. CTA NECK Skeleton: Absent dentition. Cervical spine degeneration. No acute or suspicious osseous lesion identified. Upper chest: Centrilobular emphysema and apical lung scarring. No superior mediastinal lymphadenopathy is visible. Other neck: Chronic relatively large and mixed density up to 5.7 cm left parotid associated neck mass has not significantly changed since 2022. Primary salivary gland histology favored. Aortic arch: Calcified aortic atherosclerosis.  3 vessel arch. Right carotid system: Brachiocephalic artery and right CCA origin soft and calcified plaque without stenosis. Bulky and severe right ICA origin calcified plaque with high-grade stenosis approaching a radiographic string sign on series 9, image 112. This is along a segment of 5-6 mm. The right ICA remains patent with no additional plaque to the skull base. Left carotid system: Mild left CCA origin plaque without stenosis. Calcified plaque at the left carotid bifurcation including moderate involvement of the left ICA origin and lateral bulb on series 9, image 109. Estimated 60% stenosis results with respect to the distal vessel. Left ICA remains patent with no additional plaque to the skull base. Vertebral arteries: Proximal right subclavian artery plaque and mild tortuosity with no significant stenosis. Calcified plaque at the right vertebral artery origin but only mild stenosis suspected. Somewhat diminutive right vertebral artery, non dominant. The right vertebral remains small but patent to the skull base with no significant stenosis  identified. Up to moderate proximal left subclavian atherosclerosis but no significant stenosis. Calcified plaque at the left vertebral artery origin  with only mild stenosis. Dominant although small left vertebral artery is then patent to the skull base with no additional plaque or stenosis. CTA HEAD Posterior circulation: Distal left vertebral artery is dominant and primarily supplies the basilar. Non dominant right V4 functionally terminates in PICA. Left PICA origin is also patent. No significant distal vertebral stenosis. Patent although diminutive basilar artery. Basilar tip and SCA origins are patent. There are fetal type bilateral PCA origins. Bilateral PCA branches are within normal limits. Anterior circulation: Both ICA siphons are patent. Left siphon up to moderate cavernous segment calcified plaque but only mild left siphon stenosis. Normal left posterior communicating artery origin. Right siphon mild to moderate calcified plaque with no significant stenosis. Normal right posterior communicating artery origin. Patent carotid termini. Patent MCA and ACA origins. Right A1 segment is dominant. Anterior communicating artery and bilateral ACA branches are within normal limits. Left MCA M1 segment and bifurcation are patent without stenosis. Right MCA M1 segment and bifurcation are patent without stenosis. Bilateral MCA branches are within normal limits. Venous sinuses: Early contrast timing, not well evaluated. Anatomic variants: Diminutive vertebrobasilar system on the basis of fetal type PCA origins with dominant left vertebral. Right V4 functionally terminates in PICA. Dominant right A1 segment. Review of the MIP images confirms the above findings IMPRESSION: 1. Negative for large vessel occlusion. But Positive for Severe Right ICA origin stenosis approaching a RADIOGRAPHIC STRING SIGN. Up to 60% stenosis of the Left ICA origin. Recommend follow-up with Vascular Surgery. 2. Small Right operculum infarct on  MRI yesterday is occult by CT. No hemorrhagic transformation or mass effect. No new No acute intracranial abnormality. 3. 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 Primary salivary gland histology is favored. 4. Aortic Atherosclerosis (ICD10-I70.0) and Emphysema (ICD10-J43.9). Electronically Signed   By: Odessa Fleming M.D.   On: 01/05/2023 09:27   MR BRAIN W WO CONTRAST  Result Date: 01/04/2023 CLINICAL DATA:  History of lung cancer, stroke suspected EXAM: MRI HEAD WITHOUT AND WITH CONTRAST TECHNIQUE: Multiplanar, multiecho pulse sequences of the brain and surrounding structures were obtained without and with intravenous contrast. CONTRAST:  10mL GADAVIST GADOBUTROL 1 MMOL/ML IV SOLN COMPARISON:  MRI head 11/20/2020, correlation is also made with CT head 01/04/2023 FINDINGS: Brain: Focus of restricted diffusion with ADC correlate in the right inferior frontal gyrus (series 2, image 32 and series 250, image 32), which correlates with increased T2 hyperintense signal but no contrast enhancement. No abnormal parenchymal or meningeal enhancement. No acute hemorrhage, mass, mass effect, or midline shift. No hydrocephalus or extra-axial collection. No hemosiderin deposition to suggest remote hemorrhage. Normal cerebral volume for age. Vascular: Normal arterial flow voids. Normal arterial and venous enhancement. Skull and upper cervical spine: Normal marrow signal. Sinuses/Orbits: Clear paranasal sinuses. No acute finding in the orbits. Other: The mastoid air cells are well aerated. IMPRESSION: Acute infarct in the right inferior frontal gyrus. No evidence of metastatic disease in the brain. These results will be called to the ordering clinician or representative by the Radiologist Assistant, and communication documented in the PACS or [employer]. Electronically Signed   By: Wiliam Ke M.D.   On: 01/04/2023 23:18   CT Head Wo Contrast  Result Date: 01/04/2023 CLINICAL DATA:  Transient  ischemic attack (TIA) EXAM: CT HEAD WITHOUT CONTRAST TECHNIQUE: Contiguous axial images were obtained from the base of the skull through the vertex without intravenous contrast. RADIATION DOSE REDUCTION: This exam was performed according to the departmental dose-optimization program which includes  automated exposure control, adjustment of the mA and/or kV according to patient size and/or use of iterative reconstruction technique. COMPARISON:  None Available. FINDINGS: Brain: Scratch no intracranial hemorrhage, mass effect, or midline shift. Generalized atrophy, not unexpected for age. No hydrocephalus. The basilar cisterns are patent. No evidence of territorial infarct or acute ischemia. No extra-axial or intracranial fluid collection. Vascular: Atherosclerosis of skullbase vasculature without hyperdense vessel or abnormal calcification. Skull: No fracture or focal lesion. Sinuses/Orbits: Paranasal sinuses and mastoid air cells are clear. The visualized orbits are unremarkable. Bilateral cataract resection. Other: None. IMPRESSION: 1. No acute intracranial abnormality. 2. Generalized atrophy, not unexpected for age. Electronically Signed   By: Narda Rutherford M.D.   On: 01/04/2023 16:29   DG Chest Port 1 View  Result Date: 01/04/2023 CLINICAL DATA:  Cough, shortness of breath. EXAM: PORTABLE CHEST 1 VIEW COMPARISON:  September 21, 2020. FINDINGS: The heart size and mediastinal contours are within normal limits. Postsurgical changes are seen in the left mid lung consistent with prior tumor resection. Bibasilar atelectasis or scarring is noted. The visualized skeletal structures are unremarkable. IMPRESSION: Postsurgical changes seen in left mid lung. Bibasilar subsegmental atelectasis or scarring. Electronically Signed   By: Lupita Raider M.D.   On: 01/04/2023 15:34    Microbiology: Results for orders placed or performed during the hospital encounter of 01/04/23  Resp panel by RT-PCR (RSV, Flu A&B, Covid)  Anterior Nasal Swab     Status: None   Collection Time: 01/04/23  2:23 PM   Specimen: Anterior Nasal Swab  Result Value Ref Range Status   SARS Coronavirus 2 by RT PCR NEGATIVE NEGATIVE Final   Influenza A by PCR NEGATIVE NEGATIVE Final   Influenza B by PCR NEGATIVE NEGATIVE Final    Comment: (NOTE) The Xpert Xpress SARS-CoV-2/FLU/RSV plus assay is intended as an aid in the diagnosis of influenza from Nasopharyngeal swab specimens and should not be used as a sole basis for treatment. Nasal washings and aspirates are unacceptable for Xpert Xpress SARS-CoV-2/FLU/RSV testing.  Fact Sheet for Patients: BloggerCourse.com  Fact Sheet for Healthcare Providers: SeriousBroker.it  This test is not yet approved or cleared by the [place] FDA and has been authorized for detection and/or diagnosis of SARS-CoV-2 by FDA under an Emergency Use Authorization (EUA). This EUA will remain in effect (meaning this test can be used) for the duration of the COVID-19 declaration under Section 564(b)(1) of the Act, 21 U.S.C. section 360bbb-3(b)(1), unless the authorization is terminated or revoked.     Resp Syncytial Virus by PCR NEGATIVE NEGATIVE Final    Comment: (NOTE) Fact Sheet for Patients: BloggerCourse.com  Fact Sheet for Healthcare Providers: SeriousBroker.it  This test is not yet approved or cleared by the [place] FDA and has been authorized for detection and/or diagnosis of SARS-CoV-2 by FDA under an Emergency Use Authorization (EUA). This EUA will remain in effect (meaning this test can be used) for the duration of the COVID-19 declaration under Section 564(b)(1) of the Act, 21 U.S.C. section 360bbb-3(b)(1), unless the authorization is terminated or revoked.  Performed at [facility], [address], [place], [place] [address]     Labs: CBC: Recent Labs  Lab  01/04/23 1435 01/04/23 1502 01/06/23 0811  WBC 7.1  --  7.2  NEUTROABS 5.3  --   --   HGB 11.7* 12.2* 11.6*  HCT 35.8* 36.0* 35.6*  MCV 96.2  --  97.5  PLT 220  --  214   Basic  Metabolic Panel: Recent Labs  Lab 01/04/23 1435 01/04/23 1502 01/06/23 0811  NA 137 137 136  K 4.0 4.1 4.2  CL 100 99 100  CO2 28  --  27  GLUCOSE 108* 105* 148*  BUN 18 23 17   CREATININE 1.21 1.40* 1.37*  CALCIUM 9.3  --  8.9   Liver Function Tests: Recent Labs  Lab 01/04/23 1435  AST 15  ALT 12  ALKPHOS 46  BILITOT 0.5  PROT 6.6  ALBUMIN 3.8   CBG: Recent Labs  Lab 01/05/23 1143 01/05/23 1654 01/05/23 2158 01/06/23 0629 01/06/23 1257  GLUCAP 130* 122* 159* 129* 136*    Discharge time spent: 35 minutes  Signed: Alberteen Sam, MD [facility] 01/06/2023

## 2023-01-06 NOTE — TOC Transition Note (Signed)
Transition of Care [facility]) - CM/SW Discharge Note   Patient Details  Name: Alex Edwards MRN: [id number] Date of Birth: [birth year]  Transition of Care [facility]) CM/SW Contact:  Kermit Balo, RN Phone Number: 01/06/2023, 9:55 AM   Clinical Narrative:    Pt is discharging home with home health therapies through [facility]. Information on the AVS. No DME needs.  Pt has transportation home.   Final next level of care: Home w Home Health Services Barriers to Discharge: No Barriers Identified   Patient Goals and CMS Choice CMS Medicare.gov Compare Post Acute Care list provided to:: Patient Choice offered to / list presented to : Patient  Discharge Placement                         Discharge Plan and Services Additional resources added to the After Visit Summary for     Discharge Planning Services: CM Consult Post Acute Care Choice: Home Health                    HH Arranged: PT, OT [facility] Agency: [facility] Date [facility] Agency Contacted: 01/05/23   Representative spoke with at [facility] Agency: Kandee Keen  Social Determinants of Health (SDOH) Interventions SDOH Screenings   Food Insecurity: No Food Insecurity (01/05/2023)  Housing: Low Risk  (01/05/2023)  Transportation Needs: No Transportation Needs (01/05/2023)  Utilities: Not At Risk (01/05/2023)  Depression (PHQ2-9): Medium Risk (08/24/2022)  Financial Resource Strain: Medium Risk (08/24/2022)  Physical Activity: Insufficiently Active (08/24/2022)  Stress: No Stress Concern Present (08/24/2022)  Tobacco Use: Medium Risk (01/04/2023)     Readmission Risk Interventions     No data to display

## 2023-01-06 NOTE — Progress Notes (Signed)
Occupational Therapy Treatment Patient Details Name: Alex Edwards MRN: [id number] DOB: 09/18/1935 Today's Date: 01/06/2023   History of present illness 87 yo male was admitted on 7/9 for L side weakness UE, speech changes and vision blurring L eye.MRI positive for R inferior frontal gyrus infarct, no brain mets from lung CA.  PMHx:  DM, colon polyps, obesity, psoriasis, smoker, macular degeneration R eye, basal cell epithelioma   OT comments  Pt making good progress with functional goals. Pt seated in chair upon arrival, pleasant and agreeable to OT. Session on RA, O2 SATs dropped to 86% with in room activity to walk to bathroom for toilet and shower transfers and LB simulated ADLs. Pt educated on energy conservation strategies and activity pacing. Pt placed back on O2 and quickly recovered to 92% along with deep, pursed lip breathing    Recommendations for follow up therapy are one component of a multi-disciplinary discharge planning process, led by the attending physician.  Recommendations may be updated based on patient status, additional functional criteria and insurance authorization.    Assistance Recommended at Discharge Set up Supervision/Assistance  Patient can return home with the following  A little help with bathing/dressing/bathroom;Direct supervision/assist for medications management;Direct supervision/assist for financial management   Equipment Recommendations  None recommended by OT    Recommendations for Other Services      Precautions / Restrictions Precautions Precautions: Fall Precaution Comments: watch O2 SATs Restrictions Weight Bearing Restrictions: No       Mobility Bed Mobility               General bed mobility comments: pt in chair upon arrival    Transfers Overall transfer level: Modified independent Equipment used: Rolling walker (2 wheels)                     Balance Overall balance assessment: Needs assistance Sitting-balance  support: Feet supported       Standing balance support: Bilateral upper extremity supported, During functional activity, Reliant on assistive device for balance Standing balance-Leahy Scale: Fair                             ADL either performed or assessed with clinical judgement   ADL Overall ADL's : Needs assistance/impaired                     Lower Body Dressing: Minimal assistance;Min guard   Toilet Transfer: Modified Independent   Toileting- Clothing Manipulation and Hygiene: Modified independent   Tub/ Shower Transfer: Supervision/safety   Functional mobility during ADLs: Supervision/safety;Modified independent General ADL Comments: session on RA, O2 SATs dropped to 86% with in room activity. Pt educated on energy conservation strategies and activity pacing. Pt placed back on O2 and quickly recovered to 92% along with deep, pursed lip breathing    Extremity/Trunk Assessment Upper Extremity Assessment Upper Extremity Assessment: Defer to OT evaluation   Lower Extremity Assessment Lower Extremity Assessment: Defer to PT evaluation        Vision Baseline Vision/History: 1 Wears glasses;6 Macular Degeneration Ability to See in Adequate Light: 0 Adequate     Perception     Praxis      Cognition Arousal/Alertness: Awake/alert Behavior During Therapy: WFL for tasks assessed/performed Overall Cognitive Status: Within Functional Limits for tasks assessed  Exercises      Shoulder Instructions       General Comments      Pertinent Vitals/ Pain       Pain Assessment Pain Assessment: No/denies pain Faces Pain Scale: No hurt  Home Living                                          Prior Functioning/Environment              Frequency  Min 2X/week        Progress Toward Goals  OT Goals(current goals can now be found in the care plan section)  Progress  towards OT goals: Progressing toward goals     Plan      Co-evaluation                 AM-PAC OT "6 Clicks" Daily Activity     Outcome Measure   Help from another person eating meals?: None Help from another person taking care of personal grooming?: None Help from another person toileting, which includes using toliet, bedpan, or urinal?: None Help from another person bathing (including washing, rinsing, drying)?: A Little Help from another person to put on and taking off regular upper body clothing?: None Help from another person to put on and taking off regular lower body clothing?: A Little 6 Click Score: 22    End of Session Equipment Utilized During Treatment: Gait belt  OT Visit Diagnosis: Unsteadiness on feet (R26.81);Muscle weakness (generalized) (M62.81)   Activity Tolerance Patient tolerated treatment well   Patient Left in bed;with call bell/phone within reach;with bed alarm set;with family/visitor present;Other (comment)   Nurse Communication Mobility status        Time: 1610-9604 OT Time Calculation (min): 24 min  Charges: OT General Charges $OT Visit: 1 Visit OT Treatments $Self Care/Home Management : 8-22 mins $Therapeutic Activity: 8-22 mins    Galen Manila 01/06/2023, 1:53 PM

## 2023-01-06 NOTE — Progress Notes (Addendum)
  Progress Note    01/06/2023 7:51 AM * No surgery found *  Subjective:  feeling much better today.  No further vision changes, left sided weakness, or slurred speech   Vitals:   01/06/23 0054 01/06/23 0342  BP: 111/61 (!) 132/55  Pulse: 90 89  Resp: 16 18  Temp: 98 F (36.7 C) 98.1 F (36.7 C)  SpO2: 98% 100%   Physical Exam: Lungs:  non labored Extremities:  symmetrical grip strength Neurologic: A&O; CN grossly intact  CBC    Component Value Date/Time   WBC 7.1 01/04/2023 1435   RBC 3.72 (L) 01/04/2023 1435   HGB 12.2 (L) 01/04/2023 1502   HGB 12.7 (L) 07/01/2022 1114   HGB 13.8 01/29/2020 1157   HCT 36.0 (L) 01/04/2023 1502   HCT 42.1 01/29/2020 1157   PLT 220 01/04/2023 1435   PLT 219 07/01/2022 1114   PLT 286 01/29/2020 1157   MCV 96.2 01/04/2023 1435   MCV 94 01/29/2020 1157   MCH 31.5 01/04/2023 1435   MCHC 32.7 01/04/2023 1435   RDW 12.2 01/04/2023 1435   RDW 12.1 01/29/2020 1157   LYMPHSABS 1.0 01/04/2023 1435   LYMPHSABS 2.2 01/29/2020 1157   MONOABS 0.7 01/04/2023 1435   EOSABS 0.1 01/04/2023 1435   EOSABS 0.2 01/29/2020 1157   BASOSABS 0.0 01/04/2023 1435   BASOSABS 0.0 01/29/2020 1157    BMET    Component Value Date/Time   NA 137 01/04/2023 1502   NA 141 01/29/2020 1157   K 4.1 01/04/2023 1502   CL 99 01/04/2023 1502   CO2 28 01/04/2023 1435   GLUCOSE 105 (H) 01/04/2023 1502   BUN 23 01/04/2023 1502   BUN 12 01/29/2020 1157   CREATININE 1.40 (H) 01/04/2023 1502   CREATININE 1.16 07/01/2022 1114   CREATININE 1.11 07/26/2016 0921   CALCIUM 9.3 01/04/2023 1435   GFRNONAA 59 (L) 01/04/2023 1435   GFRNONAA >60 07/01/2022 1114   GFRAA 84 01/29/2020 1157    INR    Component Value Date/Time   INR 1.0 01/04/2023 1435     Intake/Output Summary (Last 24 hours) at 01/06/2023 0751 Last data filed at 01/05/2023 1820 Gross per 24 hour  Intake 480 ml  Output 725 ml  Net -245 ml     Assessment/Plan:  87 y.o. male with symptomatic R  ICA stenosis  Subjectively, patient feels much better today and no longer experiencing vision changes, left sided weakness, or slurring speech Patient would prefer to manage R ICA stenosis medically using aspirin, plavix, statin.  He was offered R sided CEA however would prefer non-operative management.  He understands the risk of surgery.  He also understands the risk of medical management.  He is ok for discharge from vascular surgery standpoint.  He will follow up in 1 month in office with Dr. Lenell Antu.  ADDENDUM:  This patient is now agreeable to R carotid endarterectomy.  Ok for discharge home today.  Our office will contact the patient to arrange coming back on Tuesday 7/16 for surgery with Dr. Edilia Bo.  Emilie Rutter, PA-C [facility] [phone] 01/06/2023 7:51 AM

## 2023-01-06 NOTE — Progress Notes (Addendum)
STROKE TEAM PROGRESS NOTE   INTERVAL HISTORY Family is at the bedside.  Patient is standing up attempting to get dressed independently.  He is slightly short of breath without oxygen while dressing himself.  No acute overnight events.  Patient states that he has occasionally felt like his left arm was "falling asleep" but felt like it was more related to him sleeping on that arm.  Patient initially requested medical management but then decided to consent to CEA with plans for procedure on Tuesday 7/16.  Vitals:   01/06/23 0342 01/06/23 0807 01/06/23 1132 01/06/23 1227  BP: (!) 132/55 (!) 141/97  (!) 147/51  Pulse: 89 97  (!) 107  Resp: 18 18    Temp: 98.1 F (36.7 C) 98.4 F (36.9 C)  98 F (36.7 C)  TempSrc: Oral Oral  Oral  SpO2: 100% 97% 95% 96%  Weight:      Height:       CBC:  Recent Labs  Lab 01/04/23 1435 01/04/23 1502 01/06/23 0811  WBC 7.1  --  7.2  NEUTROABS 5.3  --   --   HGB 11.7* 12.2* 11.6*  HCT 35.8* 36.0* 35.6*  MCV 96.2  --  97.5  PLT 220  --  214   Basic Metabolic Panel:  Recent Labs  Lab 01/04/23 1435 01/04/23 1502 01/06/23 0811  NA 137 137 136  K 4.0 4.1 4.2  CL 100 99 100  CO2 28  --  27  GLUCOSE 108* 105* 148*  BUN 18 23 17   CREATININE 1.21 1.40* 1.37*  CALCIUM 9.3  --  8.9   Lipid Panel:  Recent Labs  Lab 01/05/23 0226  CHOL 215*  TRIG 194*  HDL 41  CHOLHDL 5.2  VLDL 39  LDLCALC 161*   HgbA1c:  Recent Labs  Lab 01/05/23 0226  HGBA1C 6.6*   Urine Drug Screen:  Recent Labs  Lab 01/04/23 1423  LABOPIA NONE DETECTED  COCAINSCRNUR NONE DETECTED  LABBENZ NONE DETECTED  AMPHETMU NONE DETECTED  THCU NONE DETECTED  LABBARB NONE DETECTED    Alcohol Level  Recent Labs  Lab 01/04/23 1435  ETH <10    IMAGING past 24 hours No results found.  PHYSICAL EXAM Constitutional: Appears well-developed and well-nourished.  Psych: Affect appropriate to situation, calm and cooperative with exam Eyes: No scleral injection, wears  eyeglasses HENT: No OP obstrucion MSK: no joint deformities or swelling Cardiovascular: Normal rate and regular rhythm.  Respiratory: Effort normal, non-labored breathing, nasal cannula in place GI: Soft.  No distension. There is no tenderness.  Skin: WDI  Neuro: Mental Status: Patient is awake, alert, oriented to person, place, month, year, and situation. Patient is able to give a clear and coherent history. No signs of aphasia or neglect.  Speech is clear without dysarthria. Naming repetition comprehension and fluency intact. Patient follows commands without difficulty. Cranial Nerves: II: Right eye central vision impairment due to chronic macular degeneration.  Right eye peripheral vision intact, impaired inferior visual field on the right.  Patient complains of intermittent left eye blurry vision with simultaneous eye burning and watering bilaterally. III,IV, VI: EOMI without gaze preference, nystagmus V: Facial sensation is intact and symmetric to light touch VII: Facial movement is symmetric resting and with movement VIII: Hearing is intact to voice X: Palate elevates symmetrically XI: Shoulder shrug is symmetric. XII: Tongue protrudes midline without atrophy or fasciculations.  Motor: Tone is normal. Bulk is normal. 5/5 strength was present in all four extremities without asymmetry  or vertical drift. Sensory: Sensation is symmetric to light touch and temperature in the arms and legs.  Patient does complain of intermittent left arm sleeping sensation that improved with arm positioning. Cerebellar: FNF and HKS are intact bilaterally  ASSESSMENT/PLAN Mr. Eragon Hammond is a 87 y.o. male with history of metastatic non-small cell lung cancer s/p chemotherapy, macular degeneration of the right eye, DM2, HLD, HTN, obesity with a BMI of 32.98 kg/m, former smoker presenting with intermittent left eye blurry vision and left arm sensory disturbance with onset Sunday, 01/02/2023.  Patient  was not a TNK candidate as his last known well time was unclear and patient was asymptomatic on hospital arrival.  Stroke: Acute small right frontal small infarct likely secondary to known right ICA stenosis (80-99% right ICA stenosis, asymptomatic 11/19/2020) CT head No acute intracranial abnormality.  Generalized atrophy, not unexpected for age. MRI brain with and without contrast - Acute infarct in the right inferior frontal gyrus.  No evidence of metastatic disease in the brain. CT angio head and neck: Negative for LVO but positive for severe right ICA origin stenosis approaching a radiographic string sign.  Up to 60% stenosis of the left ICA origin.  Aortic atherosclerosis and emphysema 2D Echo LVEF 65-70% LDL 135 HgbA1c 6.6 VTE prophylaxis -Lovenox SQ No antithrombotic prior to admission, now on aspirin 81 mg daily and clopidogrel 75 mg daily  Therapy recommendations: Home health Disposition: Home  Carotid stenosis 10/2020 CUS R ICA 80-99% stenosis, left ICA 40-59% stenosis - VVS consult done with Dr. Randie Heinz and stated that pt was not candidate for treatment of asymptomatic carotid lesion. But he lost follow up after. CT angio head and neck this admission positive for severe right ICA origin stenosis approaching a radiographic string sign.  Up to 60% stenosis of the left ICA origin. Vascular surgery consulted for symptomatic right ICA stenosis this time Plan for right CEA next Tuesday.   Hypertension Home meds: Losartan, Lopressor, HCTZ Stable Avoid low BP BP goal 140-160 before carotid revascularization Long-term BP goal normotensive after carotid revascularization  Hyperlipidemia Home meds: None LDL 135, goal < 70 Patient reports intolerance to all statin medications as well as zetia.  Does endorse wanting to try Repatha as his wife takes this medication and receives financial help to obtain this medication.  Does have concerns with starting medication due to cost. Will try to see  if pt can take Leqvio  Diabetes type II Controlled Home meds:  metformin HgbA1c 6.6, goal < 7.0 CBGs SSI Close PCP follow up  Other Stroke Risk Factors Advanced Age >/= 60  Former cigarette smoker Obesity, Body mass index is 32.98 kg/m., BMI >/= 30 associated with increased stroke risk, recommend weight loss, diet and exercise as appropriate   Other Active Problems Emphysema On home oxygen Reported increased shortness of breath and reliance on oxygen at home as of recent Metastatic Stage IV NSCLC with large left upper lobe lung mass dx May 2022 S/p palliative chemotherapy Peripheral neuropathy (presumed from chemotherapy/Keytruda) 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 Pt was seen and examined.   One family member at the bedside.  Patient sitting in chair, AOx3, neuro intact.  Vascular surgery has discussed with patient, plan for right CEA next Tuesday.  Patient will go home and come back for surgery.  Continue  DAPT pending Leqvio approval.  BP goal 140-180 prior to carotid revascularization.  Discussed with Dr. Maryfrances Bunnell.  For detailed assessment and plan, please refer to above/below as I have made changes wherever appropriate.   Marvel Plan, MD PhD Stroke Neurology 01/06/2023 9:04 PM    To contact Stroke Continuity provider, please refer to [email or web address]. After hours, contact General Neurology

## 2023-01-09 NOTE — Progress Notes (Signed)
Per Alex Edwards, he would like to cancel his upcoming surgery with dr Edilia Bo.  Pt expressed that he did not want to proceed with the procedure and would like to cancel his surgery.  Per pt,  he already left a message with Dr Lenell Antu some time this afternoon.  This nurse notified dr Adele Dan scheduler via voicemail.  Front OR desk was notified as well.

## 2023-01-10 ENCOUNTER — Telehealth: Payer: Self-pay

## 2023-01-10 NOTE — Telephone Encounter (Signed)
Received VM from [place] at [facility] Short stay informing that patient is cancelling left CEA surgery on 7/16 with Dr. Edilia Bo because he is unsure what route he wants to go at this time. Per Dr. Karin Lieu, schedule patient for a 2 week F/U with Dr. Lenell Antu to discuss carotid surgery. Message sent to scheduling to arrange appointment.

## 2023-01-11 ENCOUNTER — Other Ambulatory Visit: Payer: Self-pay

## 2023-01-11 ENCOUNTER — Encounter (HOSPITAL_COMMUNITY): Admission: RE | Payer: Self-pay | Source: Home / Self Care

## 2023-01-11 ENCOUNTER — Telehealth: Payer: Self-pay

## 2023-01-11 ENCOUNTER — Inpatient Hospital Stay (HOSPITAL_COMMUNITY): Admission: RE | Admit: 2023-01-11 | Payer: Medicare Other | Source: Home / Self Care | Admitting: Vascular Surgery

## 2023-01-11 SURGERY — ENDARTERECTOMY, CAROTID
Anesthesia: General | Laterality: Right

## 2023-01-11 NOTE — Telephone Encounter (Signed)
Pt called to clarify medication regimen now that he canceled his surgery.  Reviewed pt's chart, returned call for clarification, two identifiers used. Pt wanted to make sure he should restart his medications. Confirmed that since he is not having surgery, he should restart the medications he held, Plavix and ASA. Confirmed understanding.

## 2023-01-12 ENCOUNTER — Ambulatory Visit: Payer: Medicare Other | Admitting: Family Medicine

## 2023-01-23 DIAGNOSIS — C3492 Malignant neoplasm of unspecified part of left bronchus or lung: Secondary | ICD-10-CM | POA: Diagnosis not present

## 2023-01-23 DIAGNOSIS — J9601 Acute respiratory failure with hypoxia: Secondary | ICD-10-CM | POA: Diagnosis not present

## 2023-01-24 NOTE — Progress Notes (Unsigned)
[facility] [place]  ASSESSMENT / PLAN: 87 y.o. male with with symptomatic right 80 - 99 % carotid artery stenosis.    Recommend:  Abstinence from all tobacco products. Blood glucose control with goal A1c < 7%. Blood pressure control with goal blood pressure < 140/90 mmHg. Lipid reduction therapy with goal LDL-C <100 mg/dL. Aspirin 81mg  PO QD.  Clopidogrel 75mg  PO QD. Atorvastatin 40-80mg  PO QD (or other "high intensity" statin therapy).  Patient has been unsure about pursuing surgical therapy.   CHIEF COMPLAINT: ***  HISTORY OF PRESENT ILLNESS: Alex Edwards is a 87 y.o. male ***  VASCULAR SURGICAL HISTORY: ***  VASCULAR RISK FACTORS: {FINDINGS; POSITIVE NEGATIVE:931 668 5261} history of stroke / transient ischemic attack. {FINDINGS; POSITIVE NEGATIVE:931 668 5261} history of coronary artery disease. *** history of PCI. *** history of CABG.  {FINDINGS; POSITIVE NEGATIVE:931 668 5261} history of diabetes mellitus. Last A1c ***. {FINDINGS; POSITIVE NEGATIVE:931 668 5261} history of smoking. *** actively smoking. {FINDINGS; POSITIVE NEGATIVE:931 668 5261} history of hypertension. *** drug regimen with *** control. {FINDINGS; POSITIVE NEGATIVE:931 668 5261} history of chronic kidney disease.  Last GFR ***. CKD {stage:30421363}. {FINDINGS; POSITIVE NEGATIVE:931 668 5261} history of chronic obstructive pulmonary disease, treated with ***.  FUNCTIONAL STATUS: ECOG performance status: {findings; ecog performance status:31780} Ambulatory status: {TNHAmbulation:25868}  CAREY 1 AND 3 YEAR INDEX Male (2pts) 75-79 or 80-84 (2pts) >84 (3pts) Dependence in toileting (1pt) Partial or full dependence in dressing (1pt) History of malignant neoplasm (2pts) CHF (3pts) COPD (1pts) CKD (3pts)  0-3 pts 6% 1 year mortality ; 21% 3 year mortality 4-5 pts 12% 1 year mortality ; 36% 3 year mortality >5 pts 21% 1 year mortality; 54% 3 year mortality   Past Medical History:   Diagnosis Date   BCE (basal cell epithelioma)    Bruises easily    on hands   Cough    smokers cough or perfurmes   Degeneration macular    right eye   Diabetes mellitus without complication (HCC)    Dyslipidemia    ED (erectile dysfunction)    Hx of adenomatous colonic polyps    Hyperlipidemia    Hypertension    borderline   Obesity    Psoriasis    Seasonal allergies    Smoker    former    Past Surgical History:  Procedure Laterality Date   BRONCHIAL BRUSHINGS  11/18/2020   Procedure: BRONCHIAL BRUSHINGS;  Surgeon: Charlott Holler, MD;  Location: [facility];  Service: Pulmonary;;   BRONCHIAL NEEDLE ASPIRATION BIOPSY  11/18/2020   Procedure: BRONCHIAL NEEDLE ASPIRATION BIOPSIES;  Surgeon: Charlott Holler, MD;  Location: [facility];  Service: Pulmonary;;   BRONCHIAL WASHINGS  11/18/2020   Procedure: BRONCHIAL WASHINGS;  Surgeon: Charlott Holler, MD;  Location: [facility];  Service: Pulmonary;;   CATARACT EXTRACTION, BILATERAL Bilateral 2013   COLONOSCOPY  2005   [facility]   ENDOBRONCHIAL ULTRASOUND N/A 11/18/2020   Procedure: ENDOBRONCHIAL ULTRASOUND;  Surgeon: Charlott Holler, MD;  Location: [facility];  Service: Pulmonary;  Laterality: N/A;   POLYPECTOMY     SKIN CANCER EXCISION Right 1993   under eye-MOHS, freeze multiple places freq   TONSILLECTOMY  as child   TOTAL HIP ARTHROPLASTY Left 01/04/2014   Procedure: LEFT TOTAL HIP ARTHROPLASTY ANTERIOR APPROACH;  Surgeon: Kathryne Hitch, MD;  Location: [facility];  Service: Orthopedics;  Laterality: Left;   TOTAL HIP ARTHROPLASTY Left 02/14/2014   Procedure: Irrigation and Debridement left hip;  Surgeon: Kathryne Hitch, MD;  Location: [facility];  Service: Orthopedics;  Laterality: Left;    Family History  Problem Relation Age of Onset   Heart disease Brother     Social History   Socioeconomic History   Marital status: Married    Spouse name: Not on file   Number of children: Not on file   Years of  education: Not on file   Highest education level: Not on file  Occupational History   Not on file  Tobacco Use   Smoking status: Former    Current packs/day: 0.00    Average packs/day: 1 pack/day for 50.0 years (50.0 ttl pk-yrs)    Types: Cigarettes    Start date: 04/28/1964    Quit date: 04/28/2014    Years since quitting: 8.7   Smokeless tobacco: Never  Vaping Use   Vaping status: Never Used  Substance and Sexual Activity   Alcohol use: No    Alcohol/week: 0.0 standard drinks of alcohol   Drug use: No   Sexual activity: Yes  Other Topics Concern   Not on file  Social History Narrative   Not on file   Social Determinants of Health   Financial Resource Strain: Medium Risk (08/24/2022)   Overall Financial Resource Strain (CARDIA)    Difficulty of Paying Living Expenses: Somewhat hard  Food Insecurity: No Food Insecurity (01/05/2023)   Hunger Vital Sign    Worried About Running Out of Food in the Last Year: Never true    Ran Out of Food in the Last Year: Never true  Transportation Needs: No Transportation Needs (01/05/2023)   PRAPARE - Administrator, Civil Service (Medical): No    Lack of Transportation (Non-Medical): No  Physical Activity: Insufficiently Active (08/24/2022)   Exercise Vital Sign    Days of Exercise per Week: 3 days    Minutes of Exercise per Session: 20 min  Stress: No Stress Concern Present (08/24/2022)   [employer] of Occupational Health - Occupational Stress Questionnaire    Feeling of Stress : Not at all  Social Connections: Not on file  Intimate Partner Violence: Not At Risk (01/05/2023)   Humiliation, Afraid, Rape, and Kick questionnaire    Fear of Current or Ex-Partner: No    Emotionally Abused: No    Physically Abused: No    Sexually Abused: No    Allergies  Allergen Reactions   Paclitaxel Shortness Of Breath    SOB and light headedness, flushing and 8/10 pain in left flank Pepcid and Solumedrol administered.  Able to  tolerate remainder of Taxol infusion   Vancomycin     Red Man Syndrome    Amlodipine Swelling   Latex Other (See Comments)    "possibly allergic- dry, itching, rash"    Current Outpatient Medications  Medication Sig Dispense Refill   Ascorbic Acid (VITAMIN C) 1000 MG tablet Take 1,000 mg by mouth daily.     aspirin EC 81 MG tablet Take 1 tablet (81 mg total) by mouth daily. Swallow whole. 30 tablet 12   Cholecalciferol (VITAMIN D-3) 125 MCG (5000 UT) TABS Take 1 tablet by mouth every other day.     clopidogrel (PLAVIX) 75 MG tablet Take 1 tablet (75 mg total) by mouth daily. 30 tablet 2   ketoconazole (NIZORAL) 2 % cream Apply topically daily. 60 g 0   levothyroxine (SYNTHROID) 50 MCG tablet Take 1 tablet (50 mcg total) by mouth daily before breakfast. 30 tablet 2   loratadine (CLARITIN) 10 MG tablet Take 10 mg by mouth daily.  metFORMIN (GLUCOPHAGE-XR) 750 MG 24 hr tablet Take 1 tablet (750 mg total) by mouth daily with breakfast. 30 tablet 5   metoprolol tartrate (LOPRESSOR) 50 MG tablet TAKE ONE TABLET BY MOUTH TWICE DAILY 180 tablet 0   ONETOUCH DELICA LANCETS FINE MISC Patient is to test one time a day DX: E11.9 100 each 4   ONETOUCH VERIO test strip USE TO TEST BLOOD SUGAR ONCE DAILY. 50 strip 3   predniSONE (DELTASONE) 5 MG tablet Take 1 tablet (5 mg total) by mouth daily with breakfast. 90 tablet 1   rosuvastatin (CRESTOR) 10 MG tablet Take 1 tablet (10 mg total) by mouth at bedtime. 30 tablet 1   tamsulosin (FLOMAX) 0.4 MG CAPS capsule Take 1 capsule (0.4 mg total) by mouth daily after supper. 90 capsule 1   triamcinolone cream (KENALOG) 0.1 % APPLY TO AFFECTED AREA TWICE A DAY. 454 g 0   No current facility-administered medications for this visit.    PHYSICAL EXAM There were no vitals filed for this visit.  Constitutional: *** appearing. *** distress. Appears *** nourished.  Neurologic: CN ***. *** focal findings. *** sensory loss. Psychiatric: *** Mood and affect  symmetric and appropriate. Eyes: *** No icterus. No conjunctival pallor. Ears, nose, throat: *** mucous membranes moist. Midline trachea.  Cardiac: *** rate and rhythm.  Respiratory: *** unlabored. Abdominal: *** soft, non-tender, non-distended.  Peripheral vascular: *** Extremity: *** edema. *** cyanosis. *** pallor.  Skin: *** gangrene. *** ulceration.  Lymphatic: *** Stemmer's sign. *** palpable lymphadenopathy.    PERTINENT LABORATORY AND RADIOLOGIC DATA  Most recent CBC    Latest Ref Rng & Units 01/06/2023    8:11 AM 01/04/2023    3:02 PM 01/04/2023    2:35 PM  CBC  WBC 4.0 - 10.5 K/uL 7.2   7.1   Hemoglobin 13.0 - 17.0 g/dL 44.0  10.2  72.5   Hematocrit 39.0 - 52.0 % 35.6  36.0  35.8   Platelets 150 - 400 K/uL 214   220      Most recent CMP    Latest Ref Rng & Units 01/06/2023    8:11 AM 01/04/2023    3:02 PM 01/04/2023    2:35 PM  CMP  Glucose 70 - 99 mg/dL 366  440  347   BUN 8 - 23 mg/dL 17  23  18    Creatinine 0.61 - 1.24 mg/dL 4.25  9.56  3.87   Sodium 135 - 145 mmol/L 136  137  137   Potassium 3.5 - 5.1 mmol/L 4.2  4.1  4.0   Chloride 98 - 111 mmol/L 100  99  100   CO2 22 - 32 mmol/L 27   28   Calcium 8.9 - 10.3 mg/dL 8.9   9.3   Total Protein 6.5 - 8.1 g/dL   6.6   Total Bilirubin 0.3 - 1.2 mg/dL   0.5   Alkaline Phos 38 - 126 U/L   46   AST 15 - 41 U/L   15   ALT 0 - 44 U/L   12     Renal function CrCl cannot be calculated (Unknown ideal weight.).  Hgb A1c MFr Bld (%)  Date Value  01/05/2023 6.6 (H)    LDL Chol Calc (NIH)  Date Value Ref Range Status  08/25/2022 128 (H) 0 - 99 mg/dL Final   LDL Cholesterol  Date Value Ref Range Status  01/05/2023 135 (H) 0 - 99 mg/dL Final    Comment:  Total Cholesterol/HDL:CHD Risk Coronary Heart Disease Risk Table                     Men   Women  1/2 Average Risk   3.4   3.3  Average Risk       5.0   4.4  2 X Average Risk   9.6   7.1  3 X Average Risk  23.4   11.0        Use the calculated  Patient Ratio above and the CHD Risk Table to determine the patient's CHD Risk.        ATP III CLASSIFICATION (LDL):  <100     mg/dL   Optimal  161-096  mg/dL   Near or Above                    Optimal  130-159  mg/dL   Borderline  045-409  mg/dL   High  >811     mg/dL   Very High Performed at [facility], [address], [place], [place] [address]      Vascular Imaging: ***  Rande Brunt. Lenell Antu, MD FACS [facility] Phone Number: [phone] 01/24/2023 4:49 PM   Total time spent on preparing this encounter including chart review, data review, collecting history, examining the patient, coordinating care for this {tnhtimebilling:26202}  Portions of this report may have been transcribed using voice recognition software.  Every effort has been made to ensure accuracy; however, inadvertent computerized transcription errors may still be present.

## 2023-01-25 ENCOUNTER — Encounter: Payer: Self-pay | Admitting: Vascular Surgery

## 2023-01-25 ENCOUNTER — Other Ambulatory Visit: Payer: Self-pay | Admitting: Family Medicine

## 2023-01-25 ENCOUNTER — Ambulatory Visit: Payer: Medicare Other | Admitting: Vascular Surgery

## 2023-01-25 VITALS — BP 151/72 | HR 87 | Temp 98.2°F | Resp 20 | Ht 73.0 in | Wt 258.3 lb

## 2023-01-25 DIAGNOSIS — M1990 Unspecified osteoarthritis, unspecified site: Secondary | ICD-10-CM

## 2023-01-25 DIAGNOSIS — I6521 Occlusion and stenosis of right carotid artery: Secondary | ICD-10-CM

## 2023-01-25 MED ORDER — CLOPIDOGREL BISULFATE 75 MG PO TABS: 75.0000 mg | ORAL_TABLET | Freq: Every day | ORAL | 4 refills | Status: DC

## 2023-01-25 MED ORDER — ROSUVASTATIN CALCIUM 10 MG PO TABS: 10.0000 mg | ORAL_TABLET | Freq: Every day | ORAL | 4 refills | Status: DC

## 2023-01-25 NOTE — Telephone Encounter (Signed)
Rx last filled 05/25/22. Last appt. 09/08/22. Next appt. 01/26/23.

## 2023-01-26 ENCOUNTER — Encounter: Payer: Self-pay | Admitting: Family Medicine

## 2023-01-26 ENCOUNTER — Ambulatory Visit (INDEPENDENT_AMBULATORY_CARE_PROVIDER_SITE_OTHER): Payer: Medicare Other | Admitting: Family Medicine

## 2023-01-26 VITALS — BP 120/62 | HR 72 | Temp 98.0°F | Resp 20 | Ht 73.0 in | Wt 259.0 lb

## 2023-01-26 DIAGNOSIS — E1159 Type 2 diabetes mellitus with other circulatory complications: Secondary | ICD-10-CM

## 2023-01-26 DIAGNOSIS — M1990 Unspecified osteoarthritis, unspecified site: Secondary | ICD-10-CM | POA: Diagnosis not present

## 2023-01-26 DIAGNOSIS — Z789 Other specified health status: Secondary | ICD-10-CM | POA: Diagnosis not present

## 2023-01-26 DIAGNOSIS — G63 Polyneuropathy in diseases classified elsewhere: Secondary | ICD-10-CM

## 2023-01-26 DIAGNOSIS — I152 Hypertension secondary to endocrine disorders: Secondary | ICD-10-CM

## 2023-01-26 DIAGNOSIS — E669 Obesity, unspecified: Secondary | ICD-10-CM | POA: Diagnosis not present

## 2023-01-26 DIAGNOSIS — C801 Malignant (primary) neoplasm, unspecified: Secondary | ICD-10-CM | POA: Diagnosis not present

## 2023-01-26 DIAGNOSIS — C3492 Malignant neoplasm of unspecified part of left bronchus or lung: Secondary | ICD-10-CM | POA: Diagnosis not present

## 2023-01-26 DIAGNOSIS — E119 Type 2 diabetes mellitus without complications: Secondary | ICD-10-CM | POA: Diagnosis not present

## 2023-01-26 DIAGNOSIS — M353 Polymyalgia rheumatica: Secondary | ICD-10-CM | POA: Diagnosis not present

## 2023-01-26 MED ORDER — PREDNISONE 5 MG PO TABS
5.0000 mg | ORAL_TABLET | Freq: Every day | ORAL | 1 refills | Status: DC
Start: 2023-01-26 — End: 2023-07-19

## 2023-01-26 MED ORDER — ALBUTEROL SULFATE HFA 108 (90 BASE) MCG/ACT IN AERS: 2.0000 | INHALATION_SPRAY | Freq: Four times a day (QID) | RESPIRATORY_TRACT | 1 refills | Status: DC | PRN

## 2023-01-26 NOTE — Progress Notes (Signed)
Subjective:    Patient ID: Alex Edwards, male    DOB: [birth year], 87 y.o.   MRN: [id number]  Alex Edwards is a 87 y.o. male who presents for follow-up of Type 2 diabetes mellitus.  Home blood sugar records: fasting range: 97-120 Current symptoms/problems include none and have been stable. Daily foot checks: yes   Any foot concerns: no How often blood sugars checked: daily Exercise: The patient does not participate in regular exercise at present. Diet:regular He continues on [place] and is having no difficulty with that.  He recently had a CVA and he is now on aspirin as well as Plavix.  They also started him back on Crestor and he does not seem to be having the myalgias.  He is noticing some neuropathic pain but at this point is not interested in going on any medications.  He does have evidence of PMR and 5 mg of prednisone seems to be taking care of the bilateral shoulder discomfort.  He also started taking the Flomax again to help with his urinary symptoms.  He was seen recently by cardiovascular thoracic for his carotid stenosis.  They did plan on having some surgery done but has postponed that because of his present medical status.  He is using oxygen at home especially at night.  He would like to get a portable unit but is having trouble getting it approved.  He apparently has to use several tanks of O2 in order to validate the need for a portable unit.  While in the hospital he noted that when he was given nebulizer treatments it did help with his breathing.  Also in the hospital his hemoglobin A1c was 6.6.  The following portions of the patient's history were reviewed and updated as appropriate: allergies, current medications, past medical history, past social history and problem list.  ROS as in subjective above.     Objective:    Physical Exam Alert and in no distress otherwise not examined.  Blood pressure 120/62, pulse 72, temperature 98 F (36.7 C), temperature source Oral,  resp. rate 20, height 6\' 1"  (1.854 m), weight 259 lb (117.5 kg), SpO2 97%.  Lab Review    Latest Ref Rng & Units 01/06/2023    8:11 AM 01/05/2023    2:26 AM 01/04/2023    3:02 PM 01/04/2023    2:35 PM 08/25/2022   12:12 PM  Diabetic Labs  HbA1c 4.8 - 5.6 %  6.6      Chol 0 - 200 mg/dL  409    811   HDL >91 mg/dL  41    37   Calc LDL 0 - 99 mg/dL  478    295   Triglycerides <150 mg/dL  621    308   Creatinine 0.61 - 1.24 mg/dL 6.57   8.46  9.62        01/26/2023   11:33 AM 01/25/2023    2:22 PM 01/25/2023    2:20 PM 01/06/2023   12:27 PM 01/06/2023    8:07 AM  BP/Weight  Systolic BP 120 151 144 147 141  Diastolic BP 62 72 79 51 97  Wt. (Lbs) 259  258.3    BMI 34.17 kg/m2  34.08 kg/m2        06/01/2021   11:15 AM 01/29/2021   11:00 AM  Foot/eye exam completion dates  Foot Form Completion Done Done    [name]  reports that he quit smoking about 8 years ago. His smoking  use included cigarettes. He started smoking about 58 years ago. He has a 50 pack-year smoking history. He has never used smokeless tobacco. He reports that he does not drink alcohol and does not use drugs.     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  He will continue on the aspirin as well as Plavix.  I will give him some albuterol and see if that will help with his breathing.  He will continue on oxygen at home.  Discussed the neuropathy but at this point he is not interested in having any therapy for that.  He also states that he is not interested in having any more chemotherapy.  Approximately 50 minutes spent discussing all these issues with him and his wife.

## 2023-01-27 ENCOUNTER — Other Ambulatory Visit: Payer: Self-pay

## 2023-01-27 DIAGNOSIS — I6521 Occlusion and stenosis of right carotid artery: Secondary | ICD-10-CM

## 2023-01-28 ENCOUNTER — Other Ambulatory Visit: Payer: Self-pay | Admitting: Nurse Practitioner

## 2023-02-04 ENCOUNTER — Other Ambulatory Visit: Payer: Self-pay | Admitting: Nurse Practitioner

## 2023-02-15 ENCOUNTER — Ambulatory Visit: Payer: Medicare Other | Admitting: Vascular Surgery

## 2023-02-23 DIAGNOSIS — C3492 Malignant neoplasm of unspecified part of left bronchus or lung: Secondary | ICD-10-CM | POA: Diagnosis not present

## 2023-02-23 DIAGNOSIS — J9601 Acute respiratory failure with hypoxia: Secondary | ICD-10-CM | POA: Diagnosis not present

## 2023-02-24 ENCOUNTER — Other Ambulatory Visit: Payer: Self-pay | Admitting: Family Medicine

## 2023-02-24 DIAGNOSIS — E039 Hypothyroidism, unspecified: Secondary | ICD-10-CM

## 2023-02-24 DIAGNOSIS — E119 Type 2 diabetes mellitus without complications: Secondary | ICD-10-CM

## 2023-03-01 ENCOUNTER — Other Ambulatory Visit: Payer: Self-pay

## 2023-03-01 ENCOUNTER — Telehealth: Payer: Self-pay | Admitting: Family Medicine

## 2023-03-01 NOTE — Telephone Encounter (Signed)
Chelsie from [facility] is calling requesting a order for O2 states that his insurance is not in contract with [employer] any more and has to use [employer], there fax number is [phone] Her number is [phone]

## 2023-03-02 NOTE — Telephone Encounter (Signed)
Order faxed.

## 2023-03-03 ENCOUNTER — Telehealth: Payer: Self-pay

## 2023-03-03 NOTE — Telephone Encounter (Signed)
Alex Edwards, note that Alex Edwards has been denied. Patient must try and fail either Repatha or Praluent first.  Auth Submission: DENIED Site of care: Site of care: [facility] Payer: [facility] Medicare Medication & CPT/J Code(s) submitted: Leqvio (Inclisiran) [id number] Route of submission (phone, fax, portal): portal Phone # Fax # Auth type: Buy/Bill PB Units/visits requested:  Reference number: [id number] Authorization has been DENIED because the patient must try preferred meds first, Repatha or Praluent.

## 2023-03-07 NOTE — Telephone Encounter (Signed)
Repatha is every 2 weeks injection vs Praulant once a month injection.  Called the pt to discuss and review. I went over this in detail and the patient is concerned about the cost and not being able to afford it. I advised we could look into resources and see how we could further assist to help lower copays. At this time the patient has declined moving forward with treatment. He had to stop the crestor because it started to cause worsening leg cramps/pains. He states over the years he has tried all the statins and zetia.  He has not interest in starting a new medication

## 2023-03-07 NOTE — Addendum Note (Signed)
Addended by: Judi Cong on: 03/07/2023 08:43 AM   Modules accepted: Orders

## 2023-03-18 ENCOUNTER — Other Ambulatory Visit: Payer: Self-pay | Admitting: Family Medicine

## 2023-03-18 ENCOUNTER — Other Ambulatory Visit: Payer: Self-pay | Admitting: Medical

## 2023-03-18 DIAGNOSIS — I152 Hypertension secondary to endocrine disorders: Secondary | ICD-10-CM

## 2023-03-18 DIAGNOSIS — I10 Essential (primary) hypertension: Secondary | ICD-10-CM

## 2023-03-21 ENCOUNTER — Telehealth: Payer: Self-pay | Admitting: Family Medicine

## 2023-03-21 ENCOUNTER — Other Ambulatory Visit: Payer: Self-pay

## 2023-03-21 ENCOUNTER — Other Ambulatory Visit: Payer: Self-pay | Admitting: Family Medicine

## 2023-03-21 DIAGNOSIS — I152 Hypertension secondary to endocrine disorders: Secondary | ICD-10-CM

## 2023-03-21 MED ORDER — LOSARTAN POTASSIUM 100 MG PO TABS: 100.0000 mg | ORAL_TABLET | Freq: Every day | ORAL | 0 refills | Status: DC

## 2023-03-21 MED ORDER — HYDROCHLOROTHIAZIDE 12.5 MG PO CAPS: 12.5000 mg | ORAL_CAPSULE | Freq: Every day | ORAL | 0 refills | Status: DC

## 2023-03-21 NOTE — Telephone Encounter (Signed)
Pt called needs refill hydrochlorothiazide 12.5 &  Losartan 100mg  both for 90 days to [facility]  Said when he was in hospital they removed them from his med list by mistake.  Also he would like Rosuvastatin put back on it, he doesn't need refill on that but he is taking it and wants it on his list.

## 2023-03-22 ENCOUNTER — Telehealth: Payer: Self-pay

## 2023-03-22 NOTE — Telephone Encounter (Signed)
Called patient as per Cassandra [name] PA to see why he was canceling his appointments and his CT scan. Per the patient he does not want to come back in the office to be seen at this time due to the stroke he had. He stated he is being followed very closely by his neurologist so he doesn't need to come in here. He also stated if his cancer comes back he is not taking any more treatments so the scans are not important to him. He stated he would come in for a follow up in late January to be seen by Dr. Arbutus Ped. Will get a message to scheduling to have them set up an appointment in late January. Will make Dr. Arbutus Ped and [facility] know the patients decision.

## 2023-03-24 ENCOUNTER — Other Ambulatory Visit: Payer: Self-pay | Admitting: Family Medicine

## 2023-03-25 ENCOUNTER — Telehealth: Payer: Self-pay | Admitting: Internal Medicine

## 2023-03-25 NOTE — Telephone Encounter (Signed)
Rescheduled patient's appointment from October to January per patient's request. Patient no longer would like to have CT scan done.

## 2023-04-08 ENCOUNTER — Inpatient Hospital Stay: Payer: Medicare Other

## 2023-04-12 ENCOUNTER — Inpatient Hospital Stay: Payer: Medicare Other | Admitting: Internal Medicine

## 2023-04-14 ENCOUNTER — Other Ambulatory Visit (INDEPENDENT_AMBULATORY_CARE_PROVIDER_SITE_OTHER): Payer: Medicare Other

## 2023-04-14 ENCOUNTER — Ambulatory Visit: Payer: Medicare Other | Admitting: Neurology

## 2023-04-14 ENCOUNTER — Encounter: Payer: Self-pay | Admitting: Neurology

## 2023-04-14 VITALS — BP 124/64 | HR 72 | Ht 73.0 in | Wt 258.0 lb

## 2023-04-14 DIAGNOSIS — I63239 Cerebral infarction due to unspecified occlusion or stenosis of unspecified carotid arteries: Secondary | ICD-10-CM

## 2023-04-14 DIAGNOSIS — Z23 Encounter for immunization: Secondary | ICD-10-CM

## 2023-04-14 DIAGNOSIS — G620 Drug-induced polyneuropathy: Secondary | ICD-10-CM | POA: Diagnosis not present

## 2023-04-14 DIAGNOSIS — M791 Myalgia, unspecified site: Secondary | ICD-10-CM | POA: Diagnosis not present

## 2023-04-14 MED ORDER — COENZYME Q10 30 MG PO CAPS: 200.0000 mg | ORAL_CAPSULE | Freq: Three times a day (TID) | ORAL | 0 refills | Status: DC

## 2023-04-14 NOTE — Patient Instructions (Signed)
I had a long d/w patient and his wife about his recent stroke, symptomatic carotid stenosis and his chemotherapy related peripheral neuropathy, risk for recurrent stroke/TIAs, personally independently reviewed imaging studies and stroke evaluation results and answered questions.patient is refusing right carotid revascularization despite being explained his significant recurrent stroke risk as he feels his quality of life is limited and he does not want carotid surgery or even angioplasty stenting.  Continue Plavix 75 mg daily alone now and discontinue aspirin as it has been more than 3 months since his stroke for secondary stroke prevention and maintain strict control of hypertension with blood pressure goal below 130/90, diabetes with hemoglobin A1c goal below 6.5% and lipids with LDL cholesterol goal below 70 mg/dL. I also advised the patient to eat a healthy diet with plenty of whole grains, cereals, fruits and vegetables, exercise regularly and maintain ideal body weight .we discussed medication options for his paresthesias from his neuropathy but he is refusing to try them at this time.  He was counseled to use his cane or walker at all times and we discussed fall safety precautions.  I recommend he start taking Crestor and start co-Q10 200 mg daily as well to limit statin myalgias.  Return for follow-up in the future with me in 6 months or call earlier if necessary  Stroke Prevention Some medical conditions and behaviors can lead to a higher chance of having a stroke. You can help prevent a stroke by eating healthy, exercising, not smoking, and managing any medical conditions you have. Stroke is a leading cause of functional impairment. Primary prevention is particularly important because a majority of strokes are first-time events. Stroke changes the lives of not only those who experience a stroke but also their family and other caregivers. How can this condition affect me? A stroke is a medical  emergency and should be treated right away. A stroke can lead to brain damage and can sometimes be life-threatening. If a person gets medical treatment right away, there is a better chance of surviving and recovering from a stroke. What can increase my risk? The following medical conditions may increase your risk of a stroke: Cardiovascular disease. High blood pressure (hypertension). Diabetes. High cholesterol. Sickle cell disease. Blood clotting disorders (hypercoagulable state). Obesity. Sleep disorders (obstructive sleep apnea). Other risk factors include: Being older than age 18. Having a history of blood clots, stroke, or mini-stroke (transient ischemic attack, TIA). Genetic factors, such as race, ethnicity, or a family history of stroke. Smoking cigarettes or using other tobacco products. Taking birth control pills, especially if you also use tobacco. Heavy use of alcohol or drugs, especially cocaine and methamphetamine. Physical inactivity. What actions can I take to prevent this? Manage your health conditions High cholesterol levels. Eating a healthy diet is important for preventing high cholesterol. If cholesterol cannot be managed through diet alone, you may need to take medicines. Take any prescribed medicines to control your cholesterol as told by your health care provider. Hypertension. To reduce your risk of stroke, try to keep your blood pressure below 130/80. Eating a healthy diet and exercising regularly are important for controlling blood pressure. If these steps are not enough to manage your blood pressure, you may need to take medicines. Take any prescribed medicines to control hypertension as told by your health care provider. Ask your health care provider if you should monitor your blood pressure at home. Have your blood pressure checked every year, even if your blood pressure is normal. Blood pressure increases  with age and some medical  conditions. Diabetes. Eating a healthy diet and exercising regularly are important parts of managing your blood sugar (glucose). If your blood sugar cannot be managed through diet and exercise, you may need to take medicines. Take any prescribed medicines to control your diabetes as told by your health care provider. Get evaluated for obstructive sleep apnea. Talk to your health care provider about getting a sleep evaluation if you snore a lot or have excessive sleepiness. Make sure that any other medical conditions you have, such as atrial fibrillation or atherosclerosis, are managed. Nutrition Follow instructions from your health care provider about what to eat or drink to help manage your health condition. These instructions may include: Reducing your daily calorie intake. Limiting how much salt (sodium) you use to 1,500 milligrams (mg) each day. Using only healthy fats for cooking, such as olive oil, canola oil, or sunflower oil. Eating healthy foods. You can do this by: Choosing foods that are high in fiber, such as whole grains, and fresh fruits and vegetables. Eating at least 5 servings of fruits and vegetables a day. Try to fill one-half of your plate with fruits and vegetables at each meal. Choosing lean protein foods, such as lean cuts of meat, poultry without skin, fish, tofu, beans, and nuts. Eating low-fat dairy products. Avoiding foods that are high in sodium. This can help lower blood pressure. Avoiding foods that have saturated fat, trans fat, and cholesterol. This can help prevent high cholesterol. Avoiding processed and prepared foods. Counting your daily carbohydrate intake.  Lifestyle If you drink alcohol: Limit how much you have to: 0-1 drink a day for women who are not pregnant. 0-2 drinks a day for men. Know how much alcohol is in your drink. In the [place] one drink equals one 12 oz bottle of beer ( ), one 5 oz glass of wine ( ), or one 1 oz glass of hard  liquor (44mL). Do not use any products that contain nicotine or tobacco. These products include cigarettes, chewing tobacco, and vaping devices, such as e-cigarettes. If you need help quitting, ask your health care provider. Avoid secondhand smoke. Do not use drugs. Activity  Try to stay at a healthy weight. Get at least 30 minutes of exercise on most days, such as: Fast walking. Biking. Swimming. Medicines Take over-the-counter and prescription medicines only as told by your health care provider. Aspirin or blood thinners (antiplatelets or anticoagulants) may be recommended to reduce your risk of forming blood clots that can lead to stroke. Avoid taking birth control pills. Talk to your health care provider about the risks of taking birth control pills if: You are over 29 years old. You smoke. You get very bad headaches. You have had a blood clot. Where to find more information American Stroke Association: www.strokeassociation.org Get help right away if: You or a loved one has any symptoms of a stroke. "BE FAST" is an easy way to remember the main warning signs of a stroke: B - Balance. Signs are dizziness, sudden trouble walking, or loss of balance. E - Eyes. Signs are trouble seeing or a sudden change in vision. F - Face. Signs are sudden weakness or numbness of the face, or the face or eyelid drooping on one side. A - Arms. Signs are weakness or numbness in an arm. This happens suddenly and usually on one side of the body. S - Speech. Signs are sudden trouble speaking, slurred speech, or trouble understanding what people say. T - Time.  Time to call emergency services. Write down what time symptoms started. You or a loved one has other signs of a stroke, such as: A sudden, severe headache with no known cause. Nausea or vomiting. Seizure. These symptoms may represent a serious problem that is an emergency. Do not wait to see if the symptoms will go away. Get medical help right  away. Call your local emergency services (911 in the [place] Do not drive yourself to the hospital. Summary You can help to prevent a stroke by eating healthy, exercising, not smoking, limiting alcohol intake, and managing any medical conditions you may have. Do not use any products that contain nicotine or tobacco. These include cigarettes, chewing tobacco, and vaping devices, such as e-cigarettes. If you need help quitting, ask your health care provider. Remember "BE FAST" for warning signs of a stroke. Get help right away if you or a loved one has any of these signs. This information is not intended to replace advice given to you by your health care provider. Make sure you discuss any questions you have with your health care provider. Document Revised: 05/17/2022 Document Reviewed: 05/17/2022 Elsevier Patient Education  2024 [employer].

## 2023-04-14 NOTE — Progress Notes (Signed)
[facility] [address] [place]. [place] [address] [phone]       OFFICE CONSULT NOTE  [name] Date of Birth:  Jun 02, 1936 Medical Record Number:  [id number]   Referring MD: Joen Laura  Reason for Referral: Stroke  HPI: [name] is a 87 year old pleasant Caucasian male seen today for initial office consultation visit for stroke.  History is obtained from the patient and his wife and review of electronic medical records.  I personally reviewed pertinent available imaging films in PACS.  He has past medical history of metastatic non-small cell lung cancer, macular degeneration of the right eye, diabetes, dyslipidemia, hypertension, obesity, former smoker presented to the emergency room for evaluation of intermittent neurological symptoms since Sunday, 01/02/2023.  He reported that he woke up normal but at some point during the day developed numbness involving the left arm along with blurred vision.  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  This happened on consecutive days for about an hour.  On the day of admission he had a similar episode along with left arm numbness which lasted about 3 hours prompting him to come to the emergency room.  His symptoms resolved by the time he presented.  CT head on admission showed no acute abnormalities except mild generalized atrophy.  MRI scan of the brain showed acute infarct in the right inferior frontal gyrus.  CT angiogram of the head and neck showed severe right ICA origin stenosis approaching the radiology graphic string sign.  There is 60% left ICA origin stenosis as well.  2D echo showed ejection fraction of 65 to 70%.  LDL cholesterol is 136 mg percent.  Hemoglobin A1c was 6.6.  Patient was on no antithrombotics prior to admission and was started on aspirin and Plavix and vascular surgery was consulted who recommended outpatient follow-up for carotid revascularization.  The patient  however refused the surgery after discussion about risk benefits stating given his age and his comorbidities he probably not live long enough to benefit from the surgery.  Patient states he has done well since discharge.  He has had no further recurrent episodes of numbness or vision loss.  His main complaint today is paresthesias in his feet and numbness from his chemotherapy related neuropathy which she has had since 2022.  His balance is off and he has to walk using a walker or a cane.  He has had no falls.  Patient has refused trials of gabapentin and Topamax to Lyrica for his paresthesias.  He remains on Plavix which is tolerating well without bruising or bleeding.  He was on Crestor in the past but had stopped it since he had had some myalgias.  He also gets short of breath quite easily but is not on home oxygen yet.  ROS:   14 system review of systems is positive for vision loss, numbness, balance difficulties, all other systems negative  PMH:  Past Medical History:  Diagnosis Date   BCE (basal cell epithelioma)    Bruises easily    on hands   Cough    smokers cough or perfurmes   Degeneration macular    right eye   Diabetes mellitus without complication (HCC)    Dyslipidemia    ED (erectile dysfunction)    Hx of adenomatous colonic polyps    Hyperlipidemia    Hypertension    borderline   Obesity    Psoriasis    Seasonal allergies  Smoker    former    Social History:  Social History   Socioeconomic History   Marital status: Married    Spouse name: Not on file   Number of children: Not on file   Years of education: Not on file   Highest education level: Not on file  Occupational History   Not on file  Tobacco Use   Smoking status: Former    Current packs/day: 0.00    Average packs/day: 1 pack/day for 50.0 years (50.0 ttl pk-yrs)    Types: Cigarettes    Start date: 04/28/1964    Quit date: 04/28/2014    Years since quitting: 8.9   Smokeless tobacco: Never  Vaping  Use   Vaping status: Never Used  Substance and Sexual Activity   Alcohol use: No    Alcohol/week: 0.0 standard drinks of alcohol   Drug use: No   Sexual activity: Yes  Other Topics Concern   Not on file  Social History Narrative   Not on file   Social Determinants of Health   Financial Resource Strain: Medium Risk (08/24/2022)   Overall Financial Resource Strain (CARDIA)    Difficulty of Paying Living Expenses: Somewhat hard  Food Insecurity: No Food Insecurity (01/05/2023)   Hunger Vital Sign    Worried About Running Out of Food in the Last Year: Never true    Ran Out of Food in the Last Year: Never true  Transportation Needs: No Transportation Needs (01/05/2023)   PRAPARE - Administrator, Civil Service (Medical): No    Lack of Transportation (Non-Medical): No  Physical Activity: Insufficiently Active (08/24/2022)   Exercise Vital Sign    Days of Exercise per Week: 3 days    Minutes of Exercise per Session: 20 min  Stress: No Stress Concern Present (08/24/2022)   [employer] of Occupational Health - Occupational Stress Questionnaire    Feeling of Stress : Not at all  Social Connections: Not on file  Intimate Partner Violence: Not At Risk (01/05/2023)   Humiliation, Afraid, Rape, and Kick questionnaire    Fear of Current or Ex-Partner: No    Emotionally Abused: No    Physically Abused: No    Sexually Abused: No    Medications:   Current Outpatient Medications on File Prior to Visit  Medication Sig Dispense Refill   albuterol (VENTOLIN HFA) 108 (90 Base) MCG/ACT inhaler Inhale 2 puffs into the lungs every 6 (six) hours as needed for wheezing or shortness of breath. 18 g 1   Ascorbic Acid (VITAMIN C) 1000 MG tablet Take 1,000 mg by mouth daily.     aspirin EC 81 MG tablet Take 1 tablet (81 mg total) by mouth daily. Swallow whole. 30 tablet 12   Cholecalciferol (VITAMIN D-3) 125 MCG (5000 UT) TABS Take 1 tablet by mouth every other day.     clopidogrel  (PLAVIX) 75 MG tablet Take 1 tablet (75 mg total) by mouth daily. 90 tablet 4   hydrochlorothiazide (MICROZIDE) 12.5 MG capsule Take 1 capsule (12.5 mg total) by mouth daily. 90 capsule 0   ketoconazole (NIZORAL) 2 % cream Apply topically daily. 60 g 1   levothyroxine (SYNTHROID) 50 MCG tablet Take 1 tablet (50 mcg total) by mouth daily before breakfast. 30 tablet 2   loratadine (CLARITIN) 10 MG tablet Take 10 mg by mouth daily.     losartan (COZAAR) 100 MG tablet Take 1 tablet (100 mg total) by mouth daily. 90 tablet 0   metFORMIN (  GLUCOPHAGE-XR) 750 MG 24 hr tablet Take 1 tablet (750 mg total) by mouth daily with breakfast. 30 tablet 2   metoprolol tartrate (LOPRESSOR) 50 MG tablet TAKE ONE TABLET BY MOUTH TWICE DAILY 180 tablet 0   ONETOUCH DELICA LANCETS FINE MISC Patient is to test one time a day DX: E11.9 100 each 4   ONETOUCH VERIO test strip USE TO TEST BLOOD SUGAR ONCE DAILY. 50 strip 3   predniSONE (DELTASONE) 5 MG tablet Take 1 tablet (5 mg total) by mouth daily with breakfast. 90 tablet 1   tamsulosin (FLOMAX) 0.4 MG CAPS capsule Take 1 capsule (0.4 mg total) by mouth daily after supper. 90 capsule 1   triamcinolone cream (KENALOG) 0.1 % APPLY TO AFFECTED AREA TWICE A DAY. 454 g 0   [DISCONTINUED] rosuvastatin (CRESTOR) 10 MG tablet Take 1 tablet (10 mg total) by mouth at bedtime. 90 tablet 4   No current facility-administered medications on file prior to visit.    Allergies:   Allergies  Allergen Reactions   Paclitaxel Shortness Of Breath    SOB and light headedness, flushing and 8/10 pain in left flank Pepcid and Solumedrol administered.  Able to tolerate remainder of Taxol infusion   Vancomycin     Red Man Syndrome    Amlodipine Swelling   Latex Other (See Comments)    "possibly allergic- dry, itching, rash"    Physical Exam General: Obese elderly Caucasian male, seated, in no evident distress Head: head normocephalic and atraumatic.   Neck: supple with no carotid or  supraclavicular bruits Cardiovascular: regular rate and rhythm, no murmurs Musculoskeletal: no deformity Skin:  no rash/petichiae Vascular:  Normal pulses all extremities  Neurologic Exam Mental Status: Awake and fully alert. Oriented to place and time. Recent and remote memory intact. Attention span, concentration and fund of knowledge appropriate. Mood and affect appropriate.  Cranial Nerves: Fundoscopic exam not done.  Right eye central vision impairment due to macular degeneration.  Intact peripheral vision.  Normal vision in the left eye.  Extraocular movements full without nystagmus. Visual fields full to confrontation. Hearing intact. Facial sensation intact. Face, tongue, palate moves normally and symmetrically.  Motor: Normal bulk and tone. Normal strength in all tested extremity muscles. Sensory.:  Diminished touch pinprick position and vibration sensation from ankle down bilaterally.  Romberg sign positive.   Coordination: Rapid alternating movements normal in all extremities. Finger-to-nose and heel-to-shin performed accurately bilaterally. Gait and Station: Arises from chair without difficulty. Stance is broad-based.. Gait  broad-based mildly ataxic.  Not able to to heel, toe and tandem walk without difficulty.  Reflexes: 1+ and symmetric except ankle jerks are depressed.. Toes downgoing.   NIHSS  0 Modified Rankin  1   ASSESSMENT: 87 year old Caucasian male with right frontal cortical embolic infarct in July 2024 due to critical right ICA proximal stenosis which had progressed significantly from ultrasound from 11/19/2020.  Patient refused elective right carotid revascularization and continues to do so.  Vascular risk factors of carotid disease, hyperlipidemia, diabetes, hypertension, obesity. 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  Continue Plavix 75 mg daily alone now and discontinue aspirin as it has been more than 3 months since his stroke for secondary stroke prevention and maintain strict control of hypertension with blood pressure goal below 130/90, diabetes with hemoglobin A1c goal below 6.5% and lipids with LDL cholesterol goal below 70 mg/dL. I also advised the patient to eat a healthy diet with plenty of whole grains, cereals, fruits and vegetables, exercise regularly and maintain ideal body weight .we discussed medication options for his paresthesias from his neuropathy but he is refusing to try them at this time.  He was counseled to use his cane or walker at all times and we discussed fall safety precautions.  I recommend he start taking Crestor and start co-Q10 200 mg daily as well to limit statin myalgias.  I encouraged the patient and wife to strongly considered carotid revascularization since he is at significant risk given critical stenosis and significant progression of the right carotid stenosis however patient is vehemently refusing.  I also suggested medication trial for his paresthesias from his neuropathy but he is refusing that as well.  He we will return for follow-up in the future with me in 6 months or call earlier if necessary  Delia Heady, MD  Note: This document was prepared with digital dictation and possible smart phrase technology. Any transcriptional errors that result from this process are unintentional.

## 2023-05-30 ENCOUNTER — Other Ambulatory Visit: Payer: Self-pay | Admitting: Family Medicine

## 2023-05-30 DIAGNOSIS — I10 Essential (primary) hypertension: Secondary | ICD-10-CM

## 2023-05-30 DIAGNOSIS — E039 Hypothyroidism, unspecified: Secondary | ICD-10-CM

## 2023-05-30 DIAGNOSIS — E119 Type 2 diabetes mellitus without complications: Secondary | ICD-10-CM

## 2023-05-30 DIAGNOSIS — R32 Unspecified urinary incontinence: Secondary | ICD-10-CM

## 2023-05-31 ENCOUNTER — Ambulatory Visit (INDEPENDENT_AMBULATORY_CARE_PROVIDER_SITE_OTHER): Payer: Medicare Other | Admitting: Family Medicine

## 2023-05-31 ENCOUNTER — Encounter: Payer: Self-pay | Admitting: Family Medicine

## 2023-05-31 VITALS — BP 124/76 | HR 79 | Wt 264.6 lb

## 2023-05-31 DIAGNOSIS — Z8673 Personal history of transient ischemic attack (TIA), and cerebral infarction without residual deficits: Secondary | ICD-10-CM | POA: Diagnosis not present

## 2023-05-31 DIAGNOSIS — I152 Hypertension secondary to endocrine disorders: Secondary | ICD-10-CM | POA: Diagnosis not present

## 2023-05-31 DIAGNOSIS — E785 Hyperlipidemia, unspecified: Secondary | ICD-10-CM

## 2023-05-31 DIAGNOSIS — E1169 Type 2 diabetes mellitus with other specified complication: Secondary | ICD-10-CM

## 2023-05-31 DIAGNOSIS — E119 Type 2 diabetes mellitus without complications: Secondary | ICD-10-CM | POA: Diagnosis not present

## 2023-05-31 DIAGNOSIS — G63 Polyneuropathy in diseases classified elsewhere: Secondary | ICD-10-CM | POA: Diagnosis not present

## 2023-05-31 DIAGNOSIS — C801 Malignant (primary) neoplasm, unspecified: Secondary | ICD-10-CM | POA: Diagnosis not present

## 2023-05-31 DIAGNOSIS — C3492 Malignant neoplasm of unspecified part of left bronchus or lung: Secondary | ICD-10-CM

## 2023-05-31 DIAGNOSIS — E1159 Type 2 diabetes mellitus with other circulatory complications: Secondary | ICD-10-CM

## 2023-05-31 NOTE — Progress Notes (Signed)
Subjective:    Patient ID: Alex Edwards, male    DOB: [birth year], 87 y.o.   MRN: [id number]  Alex Edwards is a 87 y.o. male who presents for follow-up of Type 2 diabetes mellitus.  Patient is checking home blood sugars.   Home blood sugar records: BGs range between 95 and 116 How often is blood sugars being checked: Every morning before eating  Current symptoms/problems include none and have been unchanged. Daily foot checks: yes   Any foot concerns: none Last eye exam: 2022 Exercise: Home exercise routine includes walking 3 hrs per week. He is still having difficulty with his neuropathy from his chemotherapy and is using a cane that seems to be doing fairly well with that.  His pulse ox sedentary is above 92 however with activity it drops down into the low 80s and he does use oxygen at that time.  He has had a CVA as well as some carotid stenosis but is not interested in doing any intervention on that.  He did start taking Crestor again.  He was concerned about that causing some muscle aches and pains and he will keep me informed as to how he is doing on it.  He also states that at this time he is really not interested in pursuing any more chemotherapy or potentially any intervention except with his breathing but he states that his breathing has not changed in the last 6 months. The following portions of the patient's history were reviewed and updated as appropriate: allergies, current medications, past medical history, past social history and problem list.  ROS as in subjective above.     Objective:    Physical Exam Alert and in no distress otherwise not examined.  Blood pressure 124/76, pulse 79, weight 264 lb 9.6 oz (120 kg), SpO2 93%.  Lab Review    Latest Ref Rng & Units 01/06/2023    8:11 AM 01/05/2023    2:26 AM 01/04/2023    3:02 PM 01/04/2023    2:35 PM 08/25/2022   12:12 PM  Diabetic Labs  HbA1c 4.8 - 5.6 %  6.6      Chol 0 - 200 mg/dL  409    811   HDL >91 mg/dL  41     37   Calc LDL 0 - 99 mg/dL  478    295   Triglycerides <150 mg/dL  621    308   Creatinine 0.61 - 1.24 mg/dL 6.57   8.46  9.62        05/31/2023   11:15 AM 04/14/2023   11:14 AM 01/26/2023   11:33 AM 01/25/2023    2:22 PM 01/25/2023    2:20 PM  BP/Weight  Systolic BP 124 124 120 151 144  Diastolic BP 76 64 62 72 79  Wt. (Lbs) 264.6 258 259  258.3  BMI 34.91 kg/m2 34.04 kg/m2 34.17 kg/m2  34.08 kg/m2      06/01/2021   11:15 AM 01/29/2021   11:00 AM  Foot/eye exam completion dates  Foot Form Completion Done Done    Alex Edwards  reports that he quit smoking about 9 years ago. His smoking use included cigarettes. He started smoking about 59 years ago. He has a 50 pack-year smoking history. He has never used smokeless tobacco. He reports that he does not drink alcohol and does not use drugs.     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  At this point he is really not interested in pursuing any more intervention for his underlying lung cancer and only potentially for his breathing if it gets worse.

## 2023-06-01 LAB — HEMOGLOBIN A1C
Est. average glucose Bld gHb Est-mCnc: 157 mg/dL
Hgb A1c MFr Bld: 7.1 % — ABNORMAL HIGH (ref 4.8–5.6)

## 2023-06-20 ENCOUNTER — Other Ambulatory Visit: Payer: Self-pay | Admitting: Family Medicine

## 2023-06-20 DIAGNOSIS — E1159 Type 2 diabetes mellitus with other circulatory complications: Secondary | ICD-10-CM

## 2023-07-11 ENCOUNTER — Inpatient Hospital Stay: Payer: Medicare Other

## 2023-07-11 ENCOUNTER — Inpatient Hospital Stay: Payer: Medicare Other | Admitting: Internal Medicine

## 2023-07-19 ENCOUNTER — Other Ambulatory Visit: Payer: Self-pay | Admitting: Family Medicine

## 2023-07-19 DIAGNOSIS — M1990 Unspecified osteoarthritis, unspecified site: Secondary | ICD-10-CM

## 2023-07-19 NOTE — Telephone Encounter (Signed)
Are these okay to refill? 

## 2023-07-26 ENCOUNTER — Encounter (HOSPITAL_COMMUNITY): Payer: Medicare Other

## 2023-07-26 ENCOUNTER — Ambulatory Visit: Payer: Medicare Other | Admitting: Vascular Surgery

## 2023-08-23 ENCOUNTER — Encounter: Payer: Self-pay | Admitting: Internal Medicine

## 2023-08-30 ENCOUNTER — Ambulatory Visit: Payer: Medicare Other

## 2023-08-30 DIAGNOSIS — Z0000 Encounter for general adult medical examination without abnormal findings: Secondary | ICD-10-CM | POA: Diagnosis not present

## 2023-08-30 NOTE — Progress Notes (Signed)
 Subjective:   Alex Edwards is a 88 y.o. who presents for a Medicare Wellness preventive visit.  Visit Complete: Virtual I connected with  Alex Edwards on 08/30/23 by a audio enabled telemedicine application and verified that I am speaking with the correct person using two identifiers.  Patient Location: Home  Provider Location: Office/Clinic  I discussed the limitations of evaluation and management by telemedicine. The patient expressed understanding and agreed to proceed.  Vital Signs: Because this visit was a virtual/telehealth visit, some criteria may be missing or patient reported. Any vitals not documented were not able to be obtained and vitals that have been documented are patient reported.  VideoError- Librarian, academic were attempted between this provider and patient, however failed, due to patient having technical difficulties OR patient did not have access to video capability.  We continued and completed visit with audio only.   AWV Questionnaire: Yes: Patient Medicare AWV questionnaire was completed by the patient on 08/23/2023; I have confirmed that all information answered by patient is correct and no changes since this date.  Cardiac Risk Factors include: advanced age (>59men, >31 women);diabetes mellitus;dyslipidemia;hypertension;male gender     Objective:    Today's Vitals   There is no height or weight on file to calculate BMI.     08/30/2023   10:14 AM 01/05/2023    5:13 AM 01/04/2023    2:04 PM 08/24/2022   10:03 AM 07/05/2022    1:39 PM 12/30/2021   10:03 AM 09/29/2021    1:25 PM  Advanced Directives  Does Patient Have a Medical Advance Directive? No  No No No No No  Type of Agricultural consultant;Living will Healthcare Power of [place];Living will   Does patient want to make changes to medical advance directive?      No - Patient declined   Copy of Healthcare Power of Attorney in Chart?     No - copy  requested No - copy requested   Would patient like information on creating a medical advance directive? No - Patient declined No - Patient declined No - Patient declined        Current Medications (verified) Outpatient Encounter Medications as of 08/30/2023  Medication Sig   albuterol (VENTOLIN HFA) 108 (90 Base) MCG/ACT inhaler Inhale 2 puffs into the lungs every 6 (six) hours as needed for wheezing or shortness of breath.   Ascorbic Acid (VITAMIN C) 1000 MG tablet Take 1,000 mg by mouth daily.   Cholecalciferol (VITAMIN D-3) 125 MCG (5000 UT) TABS Take 1 tablet by mouth every other day.   clopidogrel (PLAVIX) 75 MG tablet Take 1 tablet (75 mg total) by mouth daily.   hydrochlorothiazide (MICROZIDE) 12.5 MG capsule Take 1 capsule (12.5 mg total) by mouth daily.   ketoconazole (NIZORAL) 2 % cream Apply topically daily.   levothyroxine (SYNTHROID) 50 MCG tablet Take 1 tablet (50 mcg total) by mouth daily before breakfast.   loratadine (CLARITIN) 10 MG tablet Take 10 mg by mouth daily.   losartan (COZAAR) 100 MG tablet Take 1 tablet (100 mg total) by mouth daily.   metFORMIN (GLUCOPHAGE-XR) 750 MG 24 hr tablet Take 1 tablet (750 mg total) by mouth daily with breakfast.   metoprolol tartrate (LOPRESSOR) 50 MG tablet TAKE ONE TABLET BY MOUTH TWICE DAILY   ONETOUCH DELICA LANCETS FINE MISC Patient is to test one time a day DX: E11.9   ONETOUCH VERIO test strip  USE TO TEST BLOOD SUGAR ONCE DAILY.   predniSONE (DELTASONE) 5 MG tablet Take 1 tablet (5 mg total) by mouth daily with breakfast.   tamsulosin (FLOMAX) 0.4 MG CAPS capsule TAKE ONE CAPSULE BY MOUTH DAILY AFTER SUPPER   triamcinolone cream (KENALOG) 0.1 % APPLY TO AFFECTED AREA TWICE A DAY.   co-enzyme Q-10 30 MG capsule Take 7 capsules (210 mg total) by mouth 3 (three) times daily. (Patient not taking: Reported on 08/30/2023)   [DISCONTINUED] rosuvastatin (CRESTOR) 10 MG tablet Take 1 tablet (10 mg total) by mouth at bedtime.   No  facility-administered encounter medications on file as of 08/30/2023.    Allergies (verified) Paclitaxel, Vancomycin, Amlodipine, and Latex   History: Past Medical History:  Diagnosis Date   BCE (basal cell epithelioma)    Bruises easily    on hands   Cough    smokers cough or perfurmes   Degeneration macular    right eye   Diabetes mellitus without complication (HCC)    Dyslipidemia    ED (erectile dysfunction)    Hx of adenomatous colonic polyps    Hyperlipidemia    Hypertension    borderline   Obesity    Psoriasis    Seasonal allergies    Smoker    former   Past Surgical History:  Procedure Laterality Date   BRONCHIAL BRUSHINGS  11/18/2020   Procedure: BRONCHIAL BRUSHINGS;  Surgeon: Charlott Holler, MD;  Location: [facility];  Service: Pulmonary;;   BRONCHIAL NEEDLE ASPIRATION BIOPSY  11/18/2020   Procedure: BRONCHIAL NEEDLE ASPIRATION BIOPSIES;  Surgeon: Charlott Holler, MD;  Location: [facility];  Service: Pulmonary;;   BRONCHIAL WASHINGS  11/18/2020   Procedure: BRONCHIAL WASHINGS;  Surgeon: Charlott Holler, MD;  Location: [facility];  Service: Pulmonary;;   CATARACT EXTRACTION, BILATERAL Bilateral 2013   COLONOSCOPY  2005   [facility]   ENDOBRONCHIAL ULTRASOUND N/A 11/18/2020   Procedure: ENDOBRONCHIAL ULTRASOUND;  Surgeon: Charlott Holler, MD;  Location: [facility];  Service: Pulmonary;  Laterality: N/A;   POLYPECTOMY     SKIN CANCER EXCISION Right 1993   under eye-MOHS, freeze multiple places freq   TONSILLECTOMY  as child   TOTAL HIP ARTHROPLASTY Left 01/04/2014   Procedure: LEFT TOTAL HIP ARTHROPLASTY ANTERIOR APPROACH;  Surgeon: Kathryne Hitch, MD;  Location: [facility];  Service: Orthopedics;  Laterality: Left;   TOTAL HIP ARTHROPLASTY Left 02/14/2014   Procedure: Irrigation and Debridement left hip;  Surgeon: Kathryne Hitch, MD;  Location: [facility];  Service: Orthopedics;  Laterality: Left;   Family History  Problem Relation Age of Onset    Heart disease Brother    Social History   Socioeconomic History   Marital status: Married    Spouse name: Not on file   Number of children: Not on file   Years of education: Not on file   Highest education level: Not on file  Occupational History   Not on file  Tobacco Use   Smoking status: Former    Current packs/day: 0.00    Average packs/day: 1 pack/day for 50.0 years (50.0 ttl pk-yrs)    Types: Cigarettes    Start date: 04/28/1964    Quit date: 04/28/2014    Years since quitting: 9.3   Smokeless tobacco: Never  Vaping Use   Vaping status: Never Used  Substance and Sexual Activity   Alcohol use: No    Alcohol/week: 0.0 standard drinks of alcohol   Drug use: No   Sexual activity:  Yes  Other Topics Concern   Not on file  Social History Narrative   Not on file   Social Drivers of Health   Financial Resource Strain: Medium Risk (08/30/2023)   Overall Financial Resource Strain (CARDIA)    Difficulty of Paying Living Expenses: Somewhat hard  Food Insecurity: No Food Insecurity (08/30/2023)   Hunger Vital Sign    Worried About Running Out of Food in the Last Year: Never true    Ran Out of Food in the Last Year: Never true  Transportation Needs: No Transportation Needs (08/30/2023)   PRAPARE - Administrator, Civil Service (Medical): No    Lack of Transportation (Non-Medical): No  Physical Activity: Insufficiently Active (08/30/2023)   Exercise Vital Sign    Days of Exercise per Week: 7 days    Minutes of Exercise per Session: 10 min  Stress: No Stress Concern Present (08/30/2023)   [employer] of Occupational Health - Occupational Stress Questionnaire    Feeling of Stress : Not at all  Social Connections: Moderately Isolated (08/30/2023)   Social Connection and Isolation Panel [NHANES]    Frequency of Communication with Friends and Family: Three times a week    Frequency of Social Gatherings with Friends and Family: Once a week    Attends Religious Services:  Never    Database administrator or Organizations: No    Attends Engineer, structural: Never    Marital Status: Married    Tobacco Counseling Counseling given: Not Answered    Clinical Intake:  Pre-visit preparation completed: Yes  Pain : No/denies pain     Nutritional Risks: None Diabetes: Yes CBG done?: No Did pt. bring in CBG monitor from home?: No  How often do you need to have someone help you when you read instructions, pamphlets, or other written materials from your doctor or pharmacy?: 1 - Never  Interpreter Needed?: No  Information entered by :: NAllen LPN   Activities of Daily Living     08/30/2023   10:07 AM 08/23/2023    9:19 AM  In your present state of health, do you have any difficulty performing the following activities:  Hearing? 0 0  Vision? 1 0  Comment macular degeneration   Difficulty concentrating or making decisions? 0 0  Walking or climbing stairs? 1 1  Dressing or bathing? 0 0  Doing errands, shopping? 1 1  Comment has someone with him   Preparing Food and eating ? N N  Using the Toilet? N N  In the past six months, have you accidently leaked urine? N Y  Do you have problems with loss of bowel control? N N  Managing your Medications? N N  Managing your Finances? N N  Housekeeping or managing your Housekeeping? N Y    Patient Care Team: Ronnald Nian, MD as PCP - General (Family Medicine) Syliva Overman, RN as Oncology Nurse Navigator (Oncology) Charlott Holler, MD as Consulting Physician (Pulmonary Disease) Sherrill Raring, [facility] (Pharmacist)  Indicate any recent Medical Services you may have received from other than [facility] providers in the past year (date may be approximate).     Assessment:   This is a routine wellness examination for Alex Edwards.  Hearing/Vision screen Hearing Screening - Comments:: Denies hearing issues Vision Screening - Comments:: No regular eye exams   Goals Addressed             This Visit's  Progress    Patient Stated  08/30/2023, stay alive       Depression Screen     08/30/2023   10:15 AM 08/24/2022   10:04 AM 08/21/2021    3:10 PM 01/29/2021   10:59 AM 01/29/2020   10:32 AM 01/24/2019    9:41 AM 01/31/2018   11:13 AM  PHQ 2/9 Scores  PHQ - 2 Score 0 0 0 0 0 0 0  PHQ- 9 Score 6 6         Fall Risk     08/23/2023    9:19 AM 08/24/2022   10:03 AM 08/21/2022   12:37 PM 08/21/2021    3:10 PM 08/20/2021   11:10 AM  Fall Risk   Falls in the past year? 0 0 0 0 0  Number falls in past yr: 0 0     Injury with Fall? 0 0     Risk for fall due to : Medication side effect Medication side effect  Medication side effect   Follow up Falls prevention discussed;Falls evaluation completed Falls prevention discussed;Education provided;Falls evaluation completed  Falls evaluation completed;Education provided;Falls prevention discussed     MEDICARE RISK AT HOME:  Medicare Risk at Home If so, are there any without handrails?: (Patient-Rptd) No Home free of loose throw rugs in walkways, pet beds, electrical cords, etc?: (Patient-Rptd) No Adequate lighting in your home to reduce risk of falls?: (Patient-Rptd) Yes Life alert?: (Patient-Rptd) No Use of a cane, walker or w/c?: (Patient-Rptd) Yes Grab bars in the bathroom?: (Patient-Rptd) No Shower chair or bench in shower?: (Patient-Rptd) Yes Elevated toilet seat or a handicapped toilet?: (Patient-Rptd) No  TIMED UP AND GO:  Was the test performed?  No  Cognitive Function: 6CIT completed        08/30/2023   10:16 AM 08/24/2022   10:07 AM  6CIT Screen  What Year? 0 points 0 points  What month? 0 points 0 points  What time? 0 points 0 points  Count back from 20 0 points 0 points  Months in reverse 0 points 0 points  Repeat phrase 0 points 0 points  Total Score 0 points 0 points    Immunizations Immunization History  Administered Date(s) Administered   Fluad Quad(high Dose 65+) 04/10/2019, 04/09/2020, 04/14/2021, 04/07/2022    Fluad Trivalent(High Dose 65+) 04/14/2023   Influenza Split 04/22/2009, 07/02/2010, 05/12/2011, 03/28/2012   Influenza, High Dose Seasonal PF 04/02/2013, 04/23/2014, 04/09/2015, 03/16/2016, 03/29/2017, 04/17/2018   PFIZER Comirnaty(Gray Top)Covid-19 Tri-Sucrose Vaccine 01/29/2021   PFIZER(Purple Top)SARS-COV-2 Vaccination 08/24/2019, 09/18/2019, 05/28/2020   Pneumococcal Conjugate-13 04/23/2014   Pneumococcal Polysaccharide-23 03/29/2005   Td 03/29/2005   Tdap 02/11/2012    Screening Tests Health Maintenance  Topic Date Due   OPHTHALMOLOGY EXAM  Never done   DTaP/Tdap/Td (3 - Td or Tdap) 02/10/2022   Diabetic kidney evaluation - Urine ACR  06/01/2022   FOOT EXAM  06/01/2022   COVID-19 Vaccine (5 - 2024-25 season) 02/27/2023   HEMOGLOBIN A1C  11/29/2023   Diabetic kidney evaluation - eGFR measurement  01/06/2024   Medicare Annual Wellness (AWV)  08/29/2024   Pneumonia Vaccine 45+ Years old  Completed   INFLUENZA VACCINE  Completed   HPV VACCINES  Aged Out   Colonoscopy  Discontinued   Zoster Vaccines- Shingrix  Discontinued    Health Maintenance  Health Maintenance Due  Topic Date Due   OPHTHALMOLOGY EXAM  Never done   DTaP/Tdap/Td (3 - Td or Tdap) 02/10/2022   Diabetic kidney evaluation - Urine ACR  06/01/2022   FOOT EXAM  06/01/2022   COVID-19 Vaccine (5 - 2024-25 season) 02/27/2023   Health Maintenance Items Addressed: Will get TDAP in next few months. Will follow up with eye doctor , diabetic labs will be taken care of next visit. Declines covid vaccine  Additional Screening:  Vision Screening: Recommended annual ophthalmology exams for early detection of glaucoma and other disorders of the eye.  Dental Screening: Recommended annual dental exams for proper oral hygiene  Community Resource Referral / Chronic Care Management: CRR required this visit?  No   CCM required this visit?  No     Plan:     I have personally reviewed and noted the following in the  patient's chart:   Medical and social history Use of alcohol, tobacco or illicit drugs  Current medications and supplements including opioid prescriptions. Patient is not currently taking opioid prescriptions. Functional ability and status Nutritional status Physical activity Advanced directives List of other physicians Hospitalizations, surgeries, and ER visits in previous 12 months Vitals Screenings to include cognitive, depression, and falls Referrals and appointments  In addition, I have reviewed and discussed with patient certain preventive protocols, quality metrics, and best practice recommendations. A written personalized care plan for preventive services as well as general preventive health recommendations were provided to patient.     Barb Merino, LPN   07/03/1094   After Visit Summary: (MyChart) Due to this being a telephonic visit, the after visit summary with patients personalized plan was offered to patient via MyChart   Notes: Nothing significant to report at this time.

## 2023-08-30 NOTE — Patient Instructions (Signed)
 Alex Edwards , Thank you for taking time to come for your Medicare Wellness Visit. I appreciate your ongoing commitment to your health goals. Please review the following plan we discussed and let me know if I can assist you in the future.   Referrals/Orders/Follow-Ups/Clinician Recommendations: none  This is a list of the screening recommended for you and due dates:  Health Maintenance  Topic Date Due   Eye exam for diabetics  Never done   DTaP/Tdap/Td vaccine (3 - Td or Tdap) 02/10/2022   Yearly kidney health urinalysis for diabetes  06/01/2022   Complete foot exam   06/01/2022   COVID-19 Vaccine (5 - 2024-25 season) 02/27/2023   Hemoglobin A1C  11/29/2023   Yearly kidney function blood test for diabetes  01/06/2024   Medicare Annual Wellness Visit  08/29/2024   Pneumonia Vaccine  Completed   Flu Shot  Completed   HPV Vaccine  Aged Out   Colon Cancer Screening  Discontinued   Zoster (Shingles) Vaccine  Discontinued    Advanced directives: (Provided) Advance directive discussed with you today. I have provided a copy for you to complete at home and have notarized. Once this is complete, please bring a copy in to our office so we can scan it into your chart.   Next Medicare Annual Wellness Visit scheduled for next year: Yes.  insert Preventive Care attachment Insert FALL PREVENTION attachment if needed

## 2023-10-06 ENCOUNTER — Other Ambulatory Visit: Payer: Self-pay | Admitting: Family Medicine

## 2023-10-19 ENCOUNTER — Other Ambulatory Visit: Payer: Self-pay | Admitting: Family Medicine

## 2023-10-19 DIAGNOSIS — M1990 Unspecified osteoarthritis, unspecified site: Secondary | ICD-10-CM

## 2023-11-01 ENCOUNTER — Ambulatory Visit: Payer: Medicare Other | Admitting: Family Medicine

## 2023-11-01 ENCOUNTER — Encounter: Payer: Self-pay | Admitting: Family Medicine

## 2023-11-01 VITALS — BP 124/70 | HR 87 | Wt 264.4 lb

## 2023-11-01 DIAGNOSIS — Z23 Encounter for immunization: Secondary | ICD-10-CM

## 2023-11-01 DIAGNOSIS — C3492 Malignant neoplasm of unspecified part of left bronchus or lung: Secondary | ICD-10-CM | POA: Diagnosis not present

## 2023-11-01 DIAGNOSIS — M353 Polymyalgia rheumatica: Secondary | ICD-10-CM

## 2023-11-01 DIAGNOSIS — Z8673 Personal history of transient ischemic attack (TIA), and cerebral infarction without residual deficits: Secondary | ICD-10-CM | POA: Diagnosis not present

## 2023-11-01 DIAGNOSIS — E119 Type 2 diabetes mellitus without complications: Secondary | ICD-10-CM

## 2023-11-01 DIAGNOSIS — C801 Malignant (primary) neoplasm, unspecified: Secondary | ICD-10-CM

## 2023-11-01 DIAGNOSIS — E785 Hyperlipidemia, unspecified: Secondary | ICD-10-CM | POA: Diagnosis not present

## 2023-11-01 DIAGNOSIS — E1169 Type 2 diabetes mellitus with other specified complication: Secondary | ICD-10-CM | POA: Diagnosis not present

## 2023-11-01 DIAGNOSIS — Z789 Other specified health status: Secondary | ICD-10-CM | POA: Diagnosis not present

## 2023-11-01 DIAGNOSIS — I152 Hypertension secondary to endocrine disorders: Secondary | ICD-10-CM | POA: Diagnosis not present

## 2023-11-01 DIAGNOSIS — J9611 Chronic respiratory failure with hypoxia: Secondary | ICD-10-CM | POA: Diagnosis not present

## 2023-11-01 DIAGNOSIS — E1159 Type 2 diabetes mellitus with other circulatory complications: Secondary | ICD-10-CM | POA: Diagnosis not present

## 2023-11-01 LAB — POCT GLYCOSYLATED HEMOGLOBIN (HGB A1C): Hemoglobin A1C: 7.1 % — AB (ref 4.0–5.6)

## 2023-11-01 NOTE — Progress Notes (Signed)
   Subjective:    Patient ID: Alex Edwards, male    DOB: 08-Dec-1935, 88 y.o.   MRN: [id number]  HPI He is here for an interval evaluation.  He continues on metformin  and is doing well on that.  He is also on home oxygen  to keep his oxygen  levels in the good range.  He knows to keep it above 92.  Continues on prednisone  for his PMR.  He did stop it once or twice and found that he truly does need this at 5 mg on a regular basis.  Still does complains of neuropathy mainly in the form of numbness in his feet.  Does have a history of CVA however is not interested in following up with the carotid stenosis not wanting surgery.  He is no longer taking Crestor  as it is causing myopathy.   Review of Systems     Objective:    Physical Exam Alert and in no distress.  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) - Plan: CANCELED: CBC with Differential/Platelet, CANCELED: Comprehensive metabolic panel with GFR  PMR (polymyalgia rheumatica) (HCC)  Hypertension associated with diabetes (HCC)  Hyperlipidemia associated with type 2 diabetes mellitus (HCC) - Plan: CANCELED: Lipid panel  Controlled type 2 diabetes mellitus without complication, without long-term current use of insulin  (HCC) - Plan: POCT glycosylated hemoglobin (Hb A1C), CANCELED: CBC with Differential/Platelet, CANCELED: Comprehensive metabolic panel with GFR, CANCELED: Lipid panel, CANCELED: POCT UA - Microalbumin  Need for vaccination against Streptococcus pneumoniae - Plan: Pneumococcal conjugate vaccine 20-valent (Prevnar 20)  Statin intolerance  Neuropathy associated with cancer (HCC)  History of CVA (cerebrovascular accident) At this point he seems to be handling himself fairly well.  Continue on present medication.  Discussed the neuropathy and at this point really no intervention I think would be beneficial.  He is comfortable with that.

## 2023-11-14 ENCOUNTER — Telehealth: Payer: Self-pay | Admitting: Neurology

## 2023-11-14 NOTE — Telephone Encounter (Signed)
 Pt said do not have a need to see Dr. Janett Medin right now.

## 2023-12-02 DIAGNOSIS — C3492 Malignant neoplasm of unspecified part of left bronchus or lung: Secondary | ICD-10-CM | POA: Diagnosis not present

## 2023-12-02 DIAGNOSIS — J9601 Acute respiratory failure with hypoxia: Secondary | ICD-10-CM | POA: Diagnosis not present

## 2023-12-05 ENCOUNTER — Ambulatory Visit: Payer: Medicare Other | Admitting: Neurology

## 2023-12-07 DIAGNOSIS — J9601 Acute respiratory failure with hypoxia: Secondary | ICD-10-CM | POA: Diagnosis not present

## 2023-12-07 DIAGNOSIS — C3492 Malignant neoplasm of unspecified part of left bronchus or lung: Secondary | ICD-10-CM | POA: Diagnosis not present

## 2023-12-26 ENCOUNTER — Other Ambulatory Visit: Payer: Self-pay | Admitting: Family Medicine

## 2023-12-26 DIAGNOSIS — I152 Hypertension secondary to endocrine disorders: Secondary | ICD-10-CM

## 2024-01-01 DIAGNOSIS — C3492 Malignant neoplasm of unspecified part of left bronchus or lung: Secondary | ICD-10-CM | POA: Diagnosis not present

## 2024-01-01 DIAGNOSIS — J9601 Acute respiratory failure with hypoxia: Secondary | ICD-10-CM | POA: Diagnosis not present

## 2024-01-06 DIAGNOSIS — C3492 Malignant neoplasm of unspecified part of left bronchus or lung: Secondary | ICD-10-CM | POA: Diagnosis not present

## 2024-01-06 DIAGNOSIS — J9601 Acute respiratory failure with hypoxia: Secondary | ICD-10-CM | POA: Diagnosis not present

## 2024-01-17 ENCOUNTER — Other Ambulatory Visit: Payer: Self-pay

## 2024-01-17 ENCOUNTER — Other Ambulatory Visit: Payer: Self-pay | Admitting: Family Medicine

## 2024-01-17 DIAGNOSIS — M1990 Unspecified osteoarthritis, unspecified site: Secondary | ICD-10-CM

## 2024-01-17 DIAGNOSIS — I6521 Occlusion and stenosis of right carotid artery: Secondary | ICD-10-CM

## 2024-01-17 MED ORDER — PREDNISONE 5 MG PO TABS: 5.0000 mg | ORAL_TABLET | Freq: Every day | ORAL | 0 refills | Status: DC

## 2024-01-17 MED ORDER — KETOCONAZOLE 2 % EX CREA: TOPICAL_CREAM | Freq: Every day | CUTANEOUS | 0 refills | Status: AC

## 2024-01-17 MED ORDER — LOSARTAN POTASSIUM 100 MG PO TABS: 100.0000 mg | ORAL_TABLET | Freq: Every day | ORAL | 3 refills | Status: AC

## 2024-02-01 DIAGNOSIS — C3492 Malignant neoplasm of unspecified part of left bronchus or lung: Secondary | ICD-10-CM | POA: Diagnosis not present

## 2024-02-01 DIAGNOSIS — J9601 Acute respiratory failure with hypoxia: Secondary | ICD-10-CM | POA: Diagnosis not present

## 2024-02-06 DIAGNOSIS — J9601 Acute respiratory failure with hypoxia: Secondary | ICD-10-CM | POA: Diagnosis not present

## 2024-02-06 DIAGNOSIS — C3492 Malignant neoplasm of unspecified part of left bronchus or lung: Secondary | ICD-10-CM | POA: Diagnosis not present

## 2024-02-07 ENCOUNTER — Other Ambulatory Visit: Payer: Self-pay | Admitting: Family Medicine

## 2024-02-07 NOTE — Telephone Encounter (Unsigned)
 Copied from CRM [id number]. Topic: Clinical - Prescription Issue >> Feb 07, 2024 11:06 AM Gustabo D wrote: Patient need replacement Oxygen  tank- [facility] is needing a prescription sent so they can get this to the patient. Needs prescription for contour next EZ meter or accu check guide meter- [facility] Send test strips with it. Says his insurance doesn't pay for the ones he has

## 2024-02-09 ENCOUNTER — Other Ambulatory Visit: Payer: Self-pay

## 2024-02-09 DIAGNOSIS — E119 Type 2 diabetes mellitus without complications: Secondary | ICD-10-CM

## 2024-02-09 MED ORDER — ACCU-CHEK GUIDE W/DEVICE KIT
1.0000 | PACK | 1 refills | Status: AC
Start: 2024-02-09 — End: ?

## 2024-02-23 ENCOUNTER — Other Ambulatory Visit: Payer: Self-pay | Admitting: Family Medicine

## 2024-02-23 DIAGNOSIS — I10 Essential (primary) hypertension: Secondary | ICD-10-CM

## 2024-03-03 DIAGNOSIS — J9601 Acute respiratory failure with hypoxia: Secondary | ICD-10-CM | POA: Diagnosis not present

## 2024-03-08 DIAGNOSIS — C3492 Malignant neoplasm of unspecified part of left bronchus or lung: Secondary | ICD-10-CM | POA: Diagnosis not present

## 2024-03-13 ENCOUNTER — Ambulatory Visit (INDEPENDENT_AMBULATORY_CARE_PROVIDER_SITE_OTHER): Admitting: Family Medicine

## 2024-03-13 DIAGNOSIS — C3492 Malignant neoplasm of unspecified part of left bronchus or lung: Secondary | ICD-10-CM | POA: Diagnosis not present

## 2024-03-13 DIAGNOSIS — H3530 Unspecified macular degeneration: Secondary | ICD-10-CM | POA: Diagnosis not present

## 2024-03-13 DIAGNOSIS — I152 Hypertension secondary to endocrine disorders: Secondary | ICD-10-CM

## 2024-03-13 DIAGNOSIS — Z23 Encounter for immunization: Secondary | ICD-10-CM | POA: Diagnosis not present

## 2024-03-13 DIAGNOSIS — E1169 Type 2 diabetes mellitus with other specified complication: Secondary | ICD-10-CM | POA: Diagnosis not present

## 2024-03-13 DIAGNOSIS — I700 Atherosclerosis of aorta: Secondary | ICD-10-CM

## 2024-03-13 DIAGNOSIS — E119 Type 2 diabetes mellitus without complications: Secondary | ICD-10-CM | POA: Diagnosis not present

## 2024-03-13 DIAGNOSIS — G63 Polyneuropathy in diseases classified elsewhere: Secondary | ICD-10-CM | POA: Diagnosis not present

## 2024-03-13 DIAGNOSIS — C801 Malignant (primary) neoplasm, unspecified: Secondary | ICD-10-CM

## 2024-03-13 DIAGNOSIS — E1159 Type 2 diabetes mellitus with other circulatory complications: Secondary | ICD-10-CM

## 2024-03-13 DIAGNOSIS — E785 Hyperlipidemia, unspecified: Secondary | ICD-10-CM

## 2024-03-13 DIAGNOSIS — I6521 Occlusion and stenosis of right carotid artery: Secondary | ICD-10-CM | POA: Diagnosis not present

## 2024-03-13 LAB — POCT GLYCOSYLATED HEMOGLOBIN (HGB A1C): Hemoglobin A1C: 6.6 % — AB (ref 4.0–5.6)

## 2024-03-13 LAB — LIPID PANEL

## 2024-03-13 MED ORDER — CLOPIDOGREL BISULFATE 75 MG PO TABS: 75.0000 mg | ORAL_TABLET | Freq: Every day | ORAL | 3 refills | Status: AC

## 2024-03-13 NOTE — Progress Notes (Signed)
 Subjective:    Patient ID: Alex Edwards, male    DOB: [birth year], 88 y.o.   MRN: [id number]  Alex Edwards is a 88 y.o. male who presents for follow-up of Type 2 diabetes mellitus.  Home blood sugar records: 110 Current symptoms/problems include visual disturbances, foot issues and have been worsening. Daily foot checks:yes   Any foot concerns: yes How often blood sugars checked: Exercise: The patient does not participate in regular exercise at present. Diet:reg Discussed the use of AI scribe software for clinical note transcription with the patient, who gave verbal consent to proceed.  He has been experiencing worsening neuropathy symptoms, characterized by a loss of control over his feet. There is an inability to feel his feet, which has been gradually worsening since November 2022, following chemotherapy. No pain is reported, but there is decreased function, leading to falls due to balance issues, the most recent being last week without injury. He uses a walker at home but attempted to use a cane for this visit. Walking outside is challenging, and he prefers to stay indoors.  He experiences breathing difficulties, particularly in the morning, and uses supplemental oxygen . His oxygen  level drops below 90% with activity, reaching as low as 86% after showering. He is currently on 3 to 3.5 liters of oxygen  at home, increasing to 5 liters when active.  He has been experiencing dizziness and blurred vision for the past three to four months, with a history of blindness in his right eye due to macular degeneration. He has not yet seen an eye doctor for these symptoms yet. He also reports frequent night sweats.  His current medications include a blood thinner for a blockage in his right carotid artery, metformin  for diabetes, and thyroid  medication. His A1c is reported to be 6.6.  His middle son lives with him and acts as his caregiver, handling errands, yard work, and housework. He is interested  in exploring options for compensating his son for his caregiving duties.      The following portions of the patient's history were reviewed and updated as appropriate: allergies, current medications, past medical history, past social history and problem list.  ROS as in subjective above.     Objective:    Physical Exam Alert and in no distress otherwise not examined.  Hemoglobin A1c is 6.6      Latest Ref Rng & Units 11/01/2023   12:15 PM 05/31/2023   11:54 AM 01/06/2023    8:11 AM 01/05/2023    2:26 AM 01/04/2023    3:02 PM  Diabetic Labs  HbA1c 4.0 - 5.6 % 7.1  7.1   6.6    Chol 0 - 200 mg/dL    784    HDL >59 mg/dL    41    Calc LDL 0 - 99 mg/dL    864    Triglycerides <150 mg/dL    805    Creatinine 9.38 - 1.24 mg/dL   8.62   8.59       09/28/7972   10:58 AM 08/30/2023   10:06 AM 05/31/2023   11:15 AM 04/14/2023   11:14 AM 01/26/2023   11:33 AM  BP/Weight  Systolic BP 124 -- 124 124 120  Diastolic BP 70 -- 76 64 62  Wt. (Lbs) 264.4 -- 264.6 258 259  BMI 34.88 kg/m2  34.91 kg/m2 34.04 kg/m2 34.17 kg/m2      06/01/2021   11:15 AM 01/29/2021   11:00 AM  Foot/eye exam  completion dates  Foot Form Completion Done Done    Alie  reports that he quit smoking about 9 years ago. His smoking use included cigarettes. He started smoking about 59 years ago. He has a 50 pack-year smoking history. He has never used smokeless tobacco. He reports that he does not drink alcohol  and does not use drugs.     Assessment & Plan:       Chronic respiratory failure with hypoxemia Chronic respiratory failure with hypoxemia, requiring supplemental oxygen . Oxygen  saturation drops below 90% with activity. Discussed potential lung damage from cancer and chemotherapy. He is hesitant to undergo further imaging. - Continue supplemental oxygen  therapy at 3 to 3.5 L/min. - Order chest x-ray when he is ready.  Chemotherapy-induced peripheral neuropathy Gradual worsening of peripheral neuropathy since  November 2022, attributed to chemotherapy. No pain, but significant loss of control and sensation in feet, leading to falls. Oncologist confirmed neuropathy as a side effect. No effective treatment available for neuropathy without pain. - Continue using walker for mobility and fall prevention. - Consider referral to physical therapy or occupational therapy if needed.  Blindness, right eye Blindness in the right eye. Blurred vision in the remaining eye, affecting mobility and daily activities. - he is to schedule appointment with ophthalmologist for evaluation of blurred vision and overall eye health.  Dizziness and blurred vision Dizziness and blurred vision for 3-4 months. - Schedule appointment with ophthalmologist for evaluation of blurred vision and dizziness.  Atherosclerotic cardiovascular disease Atherosclerotic cardiovascular disease. On blood thinner to prevent clot formation. - Continue Plavix  to prevent clot formation.  Type 2 diabetes mellitus Type 2 diabetes mellitus well-controlled. Recent A1c is 6.6%. - Continue current diabetes management regimen, including metformin .  General Health Maintenance Due for routine vaccinations. Expressed interest in receiving them. - Administer flu shot today. - Recommend tetanus and RSV vaccinations at pharmacy.      Recheck in 4 monthes

## 2024-03-14 ENCOUNTER — Ambulatory Visit: Payer: Self-pay | Admitting: Family Medicine

## 2024-03-14 LAB — COMPREHENSIVE METABOLIC PANEL WITH GFR
ALT: 8 IU/L (ref 0–44)
AST: 11 IU/L (ref 0–40)
Albumin: 4.2 g/dL (ref 3.7–4.7)
Alkaline Phosphatase: 58 IU/L (ref 48–129)
BUN/Creatinine Ratio: 15 (ref 10–24)
BUN: 20 mg/dL (ref 8–27)
Bilirubin Total: 0.4 mg/dL (ref 0.0–1.2)
CO2: 24 mmol/L (ref 20–29)
Calcium: 9.5 mg/dL (ref 8.6–10.2)
Chloride: 97 mmol/L (ref 96–106)
Creatinine, Ser: 1.36 mg/dL — AB (ref 0.76–1.27)
Globulin, Total: 2.3 g/dL (ref 1.5–4.5)
Glucose: 113 mg/dL — AB (ref 70–99)
Potassium: 4.9 mmol/L (ref 3.5–5.2)
Sodium: 136 mmol/L (ref 134–144)
Total Protein: 6.5 g/dL (ref 6.0–8.5)
eGFR: 51 mL/min/1.73 — AB (ref >59)

## 2024-03-14 LAB — CBC WITH DIFFERENTIAL/PLATELET
Basophils Absolute: 0 x10E3/uL (ref 0.0–0.2)
Basos: 0 %
EOS (ABSOLUTE): 0.1 x10E3/uL (ref 0.0–0.4)
Eos: 1 %
Hematocrit: 36.4 % — ABNORMAL LOW (ref 37.5–51.0)
Hemoglobin: 11.9 g/dL — ABNORMAL LOW (ref 13.0–17.7)
Immature Grans (Abs): 0 x10E3/uL (ref 0.0–0.1)
Immature Granulocytes: 0 %
Lymphocytes Absolute: 1 x10E3/uL (ref 0.7–3.1)
Lymphs: 14 %
MCH: 31.2 pg (ref 26.6–33.0)
MCHC: 32.7 g/dL (ref 31.5–35.7)
MCV: 95 fL (ref 79–97)
Monocytes Absolute: 0.6 x10E3/uL (ref 0.1–0.9)
Monocytes: 8 %
Neutrophils Absolute: 5.9 x10E3/uL (ref 1.4–7.0)
Neutrophils: 76 %
Platelets: 256 x10E3/uL (ref 150–450)
RBC: 3.82 x10E6/uL — ABNORMAL LOW (ref 4.14–5.80)
RDW: 12 % (ref 11.6–15.4)
WBC: 7.7 x10E3/uL (ref 3.4–10.8)

## 2024-03-14 LAB — LIPID PANEL
Cholesterol, Total: 201 mg/dL — AB (ref 100–199)
HDL: 41 mg/dL (ref >39)
LDL CALC COMMENT:: 4.9 ratio (ref 0.0–5.0)
LDL Chol Calc (NIH): 133 mg/dL — AB (ref 0–99)
Triglycerides: 150 mg/dL — AB (ref 0–149)
VLDL Cholesterol Cal: 27 mg/dL (ref 5–40)

## 2024-03-26 ENCOUNTER — Other Ambulatory Visit: Payer: Self-pay | Admitting: Family Medicine

## 2024-03-26 DIAGNOSIS — R32 Unspecified urinary incontinence: Secondary | ICD-10-CM

## 2024-03-27 ENCOUNTER — Encounter: Payer: Self-pay | Admitting: Licensed Clinical Social Worker

## 2024-03-27 NOTE — Patient Outreach (Signed)
 SW called patient and has him scheduled for 03/30/2024 at 10:00 am patient stated that he need the paperwork done to get the grandson who lives with him paid as a caretaker , SW will go over the SDOH needs.  Tobias [name] [name], PhD [facility], [facility] Social Worker Direct Dial: [phone]  Fax: [phone]

## 2024-03-30 ENCOUNTER — Other Ambulatory Visit: Payer: Self-pay | Admitting: Licensed Clinical Social Worker

## 2024-03-30 NOTE — Patient Outreach (Signed)
 Complex Care Management   Visit Note  03/30/2024  Name:  Alex Edwards MRN: [id number] DOB: 06/22/1936  Situation: Referral received for Complex Care Management related to Payer for Caretaker ( his son)  I obtained verbal consent from Patient.  Visit completed with Patient  on the phone  Background:   Past Medical History:  Diagnosis Date   BCE (basal cell epithelioma)    Bruises easily    on hands   Cough    smokers cough or perfurmes   Degeneration macular    right eye   Diabetes mellitus without complication (HCC)    Dyslipidemia    ED (erectile dysfunction)    Hx of adenomatous colonic polyps    Hyperlipidemia    Hypertension    borderline   Obesity    Psoriasis    Seasonal allergies    Smoker    former    Assessment: Patient was inquiring about getting services to have his son who lives with him and his wife to get paid to keep up the house, yard work, errands, and taking him to the doctor. He stated that his son is having a hard time finding a job and he is giving his son $64 a month and wants to see if there were andy programs that would pay hsi son for taking care of him and he house. SW educated the patient on the PCS and the CAP/DA program and told him that these programs has stipulations and it did not seem that he would follow in these. He stated that his son has spoken to someone in regards to this but has not heard form anyone.    SDOH Interventions    Flowsheet Row Patient Outreach from 03/30/2024 in [place] POPUL[facility] HEALTH DEPARTMENT Clinical Support from 08/30/2023 in [facility] Care Coordination from 11/11/2022 in [facility] Clinical Support from 08/24/2022 in [facility] Chronic Care Management from 05/13/2022 in [facility] Telephone from 03/26/2022 in [facility] Community Care Coordination  SDOH Interventions        Food Insecurity Interventions Intervention Not Indicated Intervention  Not Indicated Intervention Not Indicated Intervention Not Indicated -- --  Housing Interventions Intervention Not Indicated Intervention Not Indicated Intervention Not Indicated -- -- Intervention Not Indicated  Transportation Interventions Intervention Not Indicated Intervention Not Indicated -- Intervention Not Indicated -- Intervention Not Indicated  Utilities Interventions Intervention Not Indicated Intervention Not Indicated -- -- -- --  Alcohol  Usage Interventions -- Intervention Not Indicated (Score <7) -- -- -- --  Financial Strain Interventions -- Patient Declined -- Intervention Not Indicated Intervention Not Indicated --  Physical Activity Interventions -- Intervention Not Indicated -- Intervention Not Indicated -- --  Stress Interventions -- Intervention Not Indicated -- Intervention Not Indicated -- --  Social Connections Interventions -- Intervention Not Indicated -- -- -- --  Health Literacy Interventions -- Intervention Not Indicated -- -- -- --    Recommendation:   none  Follow Up Plan:   Closing From:  Complex Care Management  Tobias [name] [name], PhD [facility], [facility] Social Worker Direct Dial: [phone]  Fax: [phone]

## 2024-03-30 NOTE — Patient Instructions (Signed)
 Visit Information  Thank you for taking time to visit with me today. Please don't hesitate to contact me if I can be of assistance to you before our next scheduled appointment.  Our next appointment is no further scheduled appointments.   Please call the care guide team at [phone] if you need to cancel or reschedule your appointment.   Following is a copy of your care plan:   Goals Addressed   None     Please call the Suicide and Crisis Lifeline: 988 go to [facility] Urgent [facility] [address] [place] [phone]) call 911 if you are experiencing a Mental Health or Behavioral Health Crisis or need someone to talk to.  Patient verbalizes understanding of instructions and care plan provided today and agrees to view in MyChart. Active MyChart status and patient understanding of how to access instructions and care plan via MyChart confirmed with patient.     Alex Edwards [name] [name], PhD [facility], [facility] Social Worker Direct Dial: [phone]  Fax: [phone]

## 2024-04-02 DIAGNOSIS — J9601 Acute respiratory failure with hypoxia: Secondary | ICD-10-CM | POA: Diagnosis not present

## 2024-04-07 DIAGNOSIS — J9601 Acute respiratory failure with hypoxia: Secondary | ICD-10-CM | POA: Diagnosis not present

## 2024-04-07 DIAGNOSIS — C3492 Malignant neoplasm of unspecified part of left bronchus or lung: Secondary | ICD-10-CM | POA: Diagnosis not present

## 2024-05-19 ENCOUNTER — Other Ambulatory Visit: Payer: Self-pay | Admitting: Family Medicine

## 2024-05-19 DIAGNOSIS — E039 Hypothyroidism, unspecified: Secondary | ICD-10-CM

## 2024-05-19 DIAGNOSIS — E119 Type 2 diabetes mellitus without complications: Secondary | ICD-10-CM

## 2024-05-21 ENCOUNTER — Ambulatory Visit
Admission: RE | Admit: 2024-05-21 | Discharge: 2024-05-21 | Disposition: A | Discharge status: Home / Self Care | Source: Ambulatory Visit | Attending: Family Medicine | Admitting: Family Medicine

## 2024-05-21 ENCOUNTER — Telehealth: Payer: Self-pay | Admitting: Family Medicine

## 2024-05-21 ENCOUNTER — Ambulatory Visit: Admitting: Family Medicine

## 2024-05-21 ENCOUNTER — Telehealth: Payer: Self-pay | Admitting: *Deleted

## 2024-05-21 VITALS — BP 120/70 | HR 73 | Temp 99.9°F | Ht 73.0 in | Wt 251.0 lb

## 2024-05-21 DIAGNOSIS — J9611 Chronic respiratory failure with hypoxia: Secondary | ICD-10-CM | POA: Diagnosis not present

## 2024-05-21 DIAGNOSIS — R319 Hematuria, unspecified: Secondary | ICD-10-CM | POA: Diagnosis not present

## 2024-05-21 DIAGNOSIS — R0602 Shortness of breath: Secondary | ICD-10-CM

## 2024-05-21 DIAGNOSIS — R509 Fever, unspecified: Secondary | ICD-10-CM

## 2024-05-21 DIAGNOSIS — Z7984 Long term (current) use of oral hypoglycemic drugs: Secondary | ICD-10-CM

## 2024-05-21 DIAGNOSIS — Z5181 Encounter for therapeutic drug level monitoring: Secondary | ICD-10-CM

## 2024-05-21 DIAGNOSIS — Z9981 Dependence on supplemental oxygen: Secondary | ICD-10-CM

## 2024-05-21 DIAGNOSIS — C3492 Malignant neoplasm of unspecified part of left bronchus or lung: Secondary | ICD-10-CM | POA: Diagnosis not present

## 2024-05-21 DIAGNOSIS — E119 Type 2 diabetes mellitus without complications: Secondary | ICD-10-CM

## 2024-05-21 LAB — POCT URINALYSIS DIP (PROADVANTAGE DEVICE)
Bilirubin, UA: NEGATIVE
Glucose, UA: NEGATIVE mg/dL
Ketones, POC UA: NEGATIVE mg/dL
Leukocytes, UA: NEGATIVE
Nitrite, UA: NEGATIVE
Protein Ur, POC: 30 mg/dL — AB
Specific Gravity, Urine: 1.01
Urobilinogen, Ur: 0.2
pH, UA: 6.5 (ref 5.0–8.0)

## 2024-05-21 MED ORDER — AMOXICILLIN-POT CLAVULANATE 875-125 MG PO TABS: 1.0000 | ORAL_TABLET | Freq: Two times a day (BID) | ORAL | 0 refills | Status: DC

## 2024-05-21 MED ORDER — SULFAMETHOXAZOLE-TRIMETHOPRIM 800-160 MG PO TABS: 1.0000 | ORAL_TABLET | Freq: Two times a day (BID) | ORAL | 0 refills | Status: DC

## 2024-05-21 NOTE — Progress Notes (Signed)
 Chief Complaint  Patient presents with   Hematuria    Blood in urine that started Saturday. Left side pain.    Patient present accompanied by his wife with complaint of hematuria and L side pain. He reports that he voids into urinal, (container) and on Saturday he noticed that the urine looked bloody, like a dark brown (not red). Didn't see any clots. It was slightly cloudy, not malodorous (just strong urine smell).  Saturday night, into Sunday, he noticed pain at his L side.  Tylenol  was effective in treating this discomfort.SABRA He isn't having any pain today. Last tylenol  was last night.  He denies any dysuria--just a very slight discomfort this morning, not with other voids. He denies urgency, frequency.  He thinks he may not have been drinking enough water He has made an effort to increase his water intake in the last few days.  Today he had a hard time voiding, volumes are lower than usual, didn't go as often last night.  He typically voids every 2 hours at night, chronically.  No h/o kidney stones.  He recalls something mentioned about his bladder on CT scans. Chart reviewed-- CT abd 06/2022 mentioned dependent bladder stones. This CT also mentioned enlarged prostate with mass effects along the base of the bladder  He is not aware of any fever, denies chills.  Lab Results  Component Value Date   WBC 7.7 03/13/2024   HGB 11.9 (L) 03/13/2024   HCT 36.4 (L) 03/13/2024   MCV 95 03/13/2024   PLT 256 03/13/2024     PMH, PSH, SH reviewed  DM (controlled, A1c 6.6 in 02/2024), neuropathy (from chemo), atherosclerosis, HTN, carotid artery stenosis, h/o CVA, on Plavix  Macular degeneration, lung CA (s/p chemo), on oxygen . Never diagnosed with COPD, suspects. Only uses albuterol  prn, not on other inhalers. Former smoker (x50 years), quit 2012  Outpatient Encounter Medications as of 05/21/2024  Medication Sig Note   albuterol  (VENTOLIN  HFA) 108 (90 Base) MCG/ACT inhaler Inhale 2  puffs into the lungs every 6 (six) hours as needed for wheezing or shortness of breath. 05/21/2024: 3-4 times a day   Blood Glucose Monitoring Suppl (ACCU-CHEK GUIDE) w/Device KIT 1 each by Does not apply route as directed. DX: E11.9    Cholecalciferol (VITAMIN D-3) 125 MCG (5000 UT) TABS Take 1 tablet by mouth every other day. 05/21/2024: Ran out   clopidogrel  (PLAVIX ) 75 MG tablet Take 1 tablet (75 mg total) by mouth daily.    hydrochlorothiazide  (MICROZIDE ) 12.5 MG capsule Take 1 capsule (12.5 mg total) by mouth daily.    ketoconazole  (NIZORAL ) 2 % cream Apply topically daily. 05/21/2024: As needed   levothyroxine  (SYNTHROID ) 50 MCG tablet Take 1 tablet (50 mcg total) by mouth daily before breakfast.    losartan  (COZAAR ) 100 MG tablet Take 1 tablet (100 mg total) by mouth daily.    metFORMIN  (GLUCOPHAGE -XR) 750 MG 24 hr tablet Take 1 tablet (750 mg total) by mouth daily with breakfast.    metoprolol  tartrate (LOPRESSOR ) 50 MG tablet TAKE ONE TABLET BY MOUTH TWICE DAILY    ONETOUCH DELICA LANCETS FINE MISC Patient is to test one time a day DX: E11.9    predniSONE  (DELTASONE ) 5 MG tablet Take 1 tablet (5 mg total) by mouth daily with breakfast.    tamsulosin  (FLOMAX ) 0.4 MG CAPS capsule TAKE ONE CAPSULE BY MOUTH DAILY AFTER SUPPER    Ascorbic Acid  (VITAMIN C ) 1000 MG tablet Take 1,000 mg by mouth daily. (Patient not taking: Reported  on 05/21/2024) 05/21/2024: Ran out   loratadine  (CLARITIN ) 10 MG tablet Take 10 mg by mouth daily. (Patient not taking: Reported on 05/21/2024) 08/31/2022: Has been out of x 3 weeks   triamcinolone  cream (KENALOG ) 0.1 % APPLY TO AFFECTED AREA TWICE A DAY. (Patient not taking: Reported on 05/21/2024) 08/31/2022: As needed   [DISCONTINUED] co-enzyme Q-10 30 MG capsule Take 7 capsules (210 mg total) by mouth 3 (three) times daily. (Patient not taking: Reported on 03/13/2024)    [DISCONTINUED] rosuvastatin  (CRESTOR ) 10 MG tablet Take 1 tablet (10 mg total) by mouth at bedtime.  03/07/2023: pt states has had SE, leg cramps and unable to take   No facility-administered encounter medications on file as of 05/21/2024.   Allergies  Allergen Reactions   Paclitaxel  Shortness Of Breath    SOB and light headedness, flushing and 8/10 pain in left flank Pepcid  and Solumedrol administered.  Able to tolerate remainder of Taxol  infusion   Vancomycin      Red Man Syndrome    Amlodipine  Swelling   Latex Other (See Comments)    possibly allergic- dry, itching, rash    ROS: No fever, rare night sweats. Denies cold symptoms. Having increased SOB over the last month, worse when he first wakes up. Thinks breathing has gotten worse over the last 6 months. He coughs up phlegm more in the mornings, intermittently notices some yellow, but is usually white/clear later in the day. He denies nausea, vomiting, diarrhea. He had a hard BM today No bleeding (other than in urine), no rashes Moods are good   PHYSICAL EXAM:  BP 120/70   Pulse 73   Temp 99.9 F (37.7 C) (Tympanic)   Ht 6' 1 (1.854 m)   Wt 251 lb (113.9 kg)   SpO2 96%   BMI 33.12 kg/m   Pleasant, elderly, obese male, wearing oxygen  nasal cannula. He is alert and oriented, and is speaking fairly comfortably, in no acute distress HEENT: conjunctiva and sclera are clear.  Lips appear dry. Neck: no lymphadenopathy. L sided mass, nontender and unchanged per pt Heart: regular rate and rhythm Lungs: coarse breath sounds bilaterally which cleared with cough. Fair air movement. Abdomen: mildly tender at LLQ, no rebound or guarding (evaluated in seated position) Back: no spinal or CVA tenderness Extremities: no edema Psych: normal mood, affect, hygiene and grooming    ASSESSMENT/PLAN:  Hematuria, unspecified type - suspect kidney stone. Will cover for infection and check culture given LG fever. Prefers to wait for CT. Increase fluid intake - Plan: POCT Urinalysis DIP (Proadvantage Device), Urine Culture, CBC with  Differential/Platelet, Basic metabolic panel with GFR  Shortness of breath - worsening in the last few months. CXR. Hasn't had eval/treatment for COPD. To use albuterol  prn. f/u with PCP next week. ?pulm referral vs spirometry here - Plan: DG Chest 2 View  Chronic hypoxic respiratory failure, on home oxygen  therapy (HCC) - cont oxygen . Discussed further eval for lung function (PFTs, poss pulm referral), may need additional treatment if e/o COPD. - Plan: DG Chest 2 View  Fever, unspecified fever cause - low grade. Check CBC, CXR. 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 Neuropathy from chemo. having worsening SOB. Start with CXR  Medication monitoring encounter - Plan: CBC with Differential/Platelet, Basic metabolic panel with GFR  Controlled type 2 diabetes mellitus without complication, without long-term current use of insulin  (HCC) - controlled  Initially rx'd septra  DS--notified of poss interaction (hyperkalemia with ARB and trimeth ), so changed to Augmentin . Likely will d/c after a couple of days if culture negative.  Checking labs and CXR today. Unclear status of his cancer, hasn't had imaging or seen oncologist in over a year. Will start with CXR. Unclear if any recurrence, vs more of a COPD picture. Hasn't had spirometry or PFTs. If has obstructive changes, would benefit from additional medications for COPD.  Suspect kidney stone, hopefully will pass with increased hydration and time. If persistent bleeding or LLQ discomfort, will need CT (stone protocol).  LG fever--? Etiology, if related to hematuria, vs lungs vs not related to infection. Cover for UTI with Augmentin  until culture back.  I spent 50 minutes dedicated to the care of this patient, including pre-visit review of  records, face to face time, post-visit ordering of testing and documentation.   We discussed the possibility of CT scan to further evaluate your urinary tract. We will wait on the blood tests and chest xray results, as you suggested/preferred. I will cover for a possible infection given the fever, though the urine results themselves don't suggest infection (just blood, not white cells).  We are sending the urine for culture. If the culture is negative, you will be told to STOP the antibiotic.  Follow up next week with Dr. Joyce to further discuss your breathing, possibly referring to pulmonary (vs doing testing in our office). We also briefly discussed the enlarged prostate noted on the CT from 06/2022 (which didn't seem to have any further evaluation, cannot rule out the potential for any cancer, vs benign enlargement).  Stay well hydrated--this will help keep your urine flowing, pass any stones, and keep the bowel movements softer.

## 2024-05-21 NOTE — Telephone Encounter (Signed)
 Called pt per Dr Joyce to set up appt for Blood in urine, reached voice mail [email or web address].

## 2024-05-21 NOTE — Telephone Encounter (Unsigned)
 Copied from CRM #[id number]. Topic: Clinical - Medication Question >> May 21, 2024  2:51 PM Jasmin G wrote: Reason for CRM: Ms. Luke from [facility] [place], [place] - [address] requested a call back at [phone] to discuss recent prescription for sulfamethoxazole -trimethoprim  (BACTRIM  DS) 800-160 MG tablet as she states it interacts with his current bp med.

## 2024-05-21 NOTE — Patient Instructions (Addendum)
 Go to [facility] at [employer] now for your chest x-ray.  We discussed the possibility of CT scan to further evaluate your urinary tract. We will wait on the blood tests and chest xray results, as you suggested/preferred. I will cover for a possible infection given the fever, though the urine results themselves don't suggest infection (just blood, not white cells).  We are sending the urine for culture. If the culture is negative, you will be told to STOP the antibiotic.  Follow up next week with Dr. Joyce to further discuss your breathing, possibly referring to pulmonary (vs doing testing in our office). We also briefly discussed the enlarged prostate noted on the CT from 06/2022 (which didn't seem to have any further evaluation, cannot rule out the potential for any cancer, vs benign enlargement).  Stay well hydrated--this will help keep your urine flowing, pass any stones, and keep the bowel movements softer.

## 2024-05-22 ENCOUNTER — Ambulatory Visit: Payer: Self-pay | Admitting: Family Medicine

## 2024-05-22 LAB — BASIC METABOLIC PANEL WITH GFR
BUN/Creatinine Ratio: 12 (ref 10–24)
BUN: 27 mg/dL (ref 8–27)
CO2: 24 mmol/L (ref 20–29)
Calcium: 8.5 mg/dL — ABNORMAL LOW (ref 8.6–10.2)
Chloride: 94 mmol/L — ABNORMAL LOW (ref 96–106)
Creatinine, Ser: 2.22 mg/dL — AB (ref 0.76–1.27)
Glucose: 99 mg/dL (ref 70–99)
Potassium: 4.8 mmol/L (ref 3.5–5.2)
Sodium: 131 mmol/L — ABNORMAL LOW (ref 134–144)
eGFR: 28 mL/min/1.73 — AB (ref >59)

## 2024-05-22 LAB — CBC WITH DIFFERENTIAL/PLATELET
Basophils Absolute: 0 x10E3/uL (ref 0.0–0.2)
Basos: 0 %
EOS (ABSOLUTE): 0 x10E3/uL (ref 0.0–0.4)
Eos: 0 %
Hematocrit: 33.7 % — ABNORMAL LOW (ref 37.5–51.0)
Hemoglobin: 11.2 g/dL — ABNORMAL LOW (ref 13.0–17.7)
Immature Grans (Abs): 0.1 x10E3/uL (ref 0.0–0.1)
Immature Granulocytes: 1 %
Lymphocytes Absolute: 0.8 x10E3/uL (ref 0.7–3.1)
Lymphs: 8 %
MCH: 31.1 pg (ref 26.6–33.0)
MCHC: 33.2 g/dL (ref 31.5–35.7)
MCV: 94 fL (ref 79–97)
Monocytes Absolute: 1.2 x10E3/uL — ABNORMAL HIGH (ref 0.1–0.9)
Monocytes: 11 %
Neutrophils Absolute: 8.7 x10E3/uL — ABNORMAL HIGH (ref 1.4–7.0)
Neutrophils: 80 %
Platelets: 236 x10E3/uL (ref 150–450)
RBC: 3.6 x10E6/uL — ABNORMAL LOW (ref 4.14–5.80)
RDW: 11.8 % (ref 11.6–15.4)
WBC: 10.8 x10E3/uL (ref 3.4–10.8)

## 2024-05-23 LAB — URINE CULTURE

## 2024-05-29 ENCOUNTER — Emergency Department (HOSPITAL_COMMUNITY)

## 2024-05-29 ENCOUNTER — Encounter: Payer: Self-pay | Admitting: Family Medicine

## 2024-05-29 ENCOUNTER — Ambulatory Visit: Admitting: Family Medicine

## 2024-05-29 ENCOUNTER — Observation Stay (HOSPITAL_COMMUNITY)
Admission: EM | Admit: 2024-05-29 | Discharge: 2024-05-31 | Disposition: A | Attending: Internal Medicine | Admitting: Internal Medicine

## 2024-05-29 ENCOUNTER — Other Ambulatory Visit: Payer: Self-pay

## 2024-05-29 ENCOUNTER — Encounter (HOSPITAL_COMMUNITY): Payer: Self-pay

## 2024-05-29 VITALS — BP 122/64 | HR 77 | Resp 28 | Ht 73.0 in | Wt 231.8 lb

## 2024-05-29 DIAGNOSIS — J9611 Chronic respiratory failure with hypoxia: Secondary | ICD-10-CM

## 2024-05-29 DIAGNOSIS — Z9981 Dependence on supplemental oxygen: Secondary | ICD-10-CM

## 2024-05-29 DIAGNOSIS — C3492 Malignant neoplasm of unspecified part of left bronchus or lung: Secondary | ICD-10-CM | POA: Diagnosis not present

## 2024-05-29 DIAGNOSIS — J441 Chronic obstructive pulmonary disease with (acute) exacerbation: Secondary | ICD-10-CM | POA: Insufficient documentation

## 2024-05-29 DIAGNOSIS — B338 Other specified viral diseases: Principal | ICD-10-CM | POA: Diagnosis present

## 2024-05-29 DIAGNOSIS — M353 Polymyalgia rheumatica: Secondary | ICD-10-CM

## 2024-05-29 DIAGNOSIS — E119 Type 2 diabetes mellitus without complications: Secondary | ICD-10-CM

## 2024-05-29 DIAGNOSIS — I10 Essential (primary) hypertension: Secondary | ICD-10-CM | POA: Diagnosis not present

## 2024-05-29 DIAGNOSIS — M1990 Unspecified osteoarthritis, unspecified site: Secondary | ICD-10-CM

## 2024-05-29 DIAGNOSIS — R319 Hematuria, unspecified: Secondary | ICD-10-CM | POA: Diagnosis not present

## 2024-05-29 DIAGNOSIS — E039 Hypothyroidism, unspecified: Secondary | ICD-10-CM | POA: Diagnosis present

## 2024-05-29 DIAGNOSIS — R0682 Tachypnea, not elsewhere classified: Secondary | ICD-10-CM

## 2024-05-29 DIAGNOSIS — R0600 Dyspnea, unspecified: Principal | ICD-10-CM

## 2024-05-29 DIAGNOSIS — Z5111 Encounter for antineoplastic chemotherapy: Secondary | ICD-10-CM

## 2024-05-29 LAB — URINALYSIS, W/ REFLEX TO CULTURE (INFECTION SUSPECTED)
Bilirubin Urine: NEGATIVE
Glucose, UA: NEGATIVE mg/dL
Ketones, ur: NEGATIVE mg/dL
Nitrite: NEGATIVE
Protein, ur: NEGATIVE mg/dL
Specific Gravity, Urine: 1.011 (ref 1.005–1.030)
pH: 5 (ref 5.0–8.0)

## 2024-05-29 LAB — I-STAT CHEM 8, ED
BUN: 26 mg/dL — ABNORMAL HIGH (ref 8–23)
Calcium, Ion: 1.11 mmol/L — ABNORMAL LOW (ref 1.15–1.40)
Chloride: 97 mmol/L — ABNORMAL LOW (ref 98–111)
Creatinine, Ser: 1.9 mg/dL — ABNORMAL HIGH (ref 0.61–1.24)
Glucose, Bld: 114 mg/dL — ABNORMAL HIGH (ref 70–99)
HCT: 35 % — ABNORMAL LOW (ref 39.0–52.0)
Hemoglobin: 11.9 g/dL — ABNORMAL LOW (ref 13.0–17.0)
Potassium: 4.6 mmol/L (ref 3.5–5.1)
Sodium: 131 mmol/L — ABNORMAL LOW (ref 135–145)
TCO2: 24 mmol/L (ref 22–32)

## 2024-05-29 LAB — RESP PANEL BY RT-PCR (RSV, FLU A&B, COVID)  RVPGX2
Influenza A by PCR: NEGATIVE
Influenza B by PCR: NEGATIVE
Resp Syncytial Virus by PCR: POSITIVE — AB
SARS Coronavirus 2 by RT PCR: NEGATIVE

## 2024-05-29 LAB — COMPREHENSIVE METABOLIC PANEL WITH GFR
ALT: 13 U/L (ref 0–44)
AST: 19 U/L (ref 15–41)
Albumin: 3.4 g/dL — ABNORMAL LOW (ref 3.5–5.0)
Alkaline Phosphatase: 47 U/L (ref 38–126)
Anion gap: 11 (ref 5–15)
BUN: 28 mg/dL — ABNORMAL HIGH (ref 8–23)
CO2: 22 mmol/L (ref 22–32)
Calcium: 8.9 mg/dL (ref 8.9–10.3)
Chloride: 96 mmol/L — ABNORMAL LOW (ref 98–111)
Creatinine, Ser: 1.72 mg/dL — ABNORMAL HIGH (ref 0.61–1.24)
GFR, Estimated: 38 mL/min — ABNORMAL LOW (ref >60)
Glucose, Bld: 108 mg/dL — ABNORMAL HIGH (ref 70–99)
Potassium: 4.9 mmol/L (ref 3.5–5.1)
Sodium: 129 mmol/L — ABNORMAL LOW (ref 135–145)
Total Bilirubin: 0.4 mg/dL (ref 0.0–1.2)
Total Protein: 6.7 g/dL (ref 6.5–8.1)

## 2024-05-29 LAB — CBC WITH DIFFERENTIAL/PLATELET
Abs Immature Granulocytes: 0.07 K/uL (ref 0.00–0.07)
Basophils Absolute: 0 K/uL (ref 0.0–0.1)
Basophils Relative: 0 %
Eosinophils Absolute: 0 K/uL (ref 0.0–0.5)
Eosinophils Relative: 0 %
HCT: 34.4 % — ABNORMAL LOW (ref 39.0–52.0)
Hemoglobin: 11.3 g/dL — ABNORMAL LOW (ref 13.0–17.0)
Immature Granulocytes: 1 %
Lymphocytes Relative: 5 %
Lymphs Abs: 0.5 K/uL — ABNORMAL LOW (ref 0.7–4.0)
MCH: 31.4 pg (ref 26.0–34.0)
MCHC: 32.8 g/dL (ref 30.0–36.0)
MCV: 95.6 fL (ref 80.0–100.0)
Monocytes Absolute: 0.9 K/uL (ref 0.1–1.0)
Monocytes Relative: 8 %
Neutro Abs: 10.3 K/uL — ABNORMAL HIGH (ref 1.7–7.7)
Neutrophils Relative %: 86 %
Platelets: 290 K/uL (ref 150–400)
RBC: 3.6 MIL/uL — ABNORMAL LOW (ref 4.22–5.81)
RDW: 12.2 % (ref 11.5–15.5)
WBC: 11.9 K/uL — ABNORMAL HIGH (ref 4.0–10.5)
nRBC: 0 % (ref 0.0–0.2)

## 2024-05-29 LAB — GLUCOSE, CAPILLARY: Glucose-Capillary: 219 mg/dL — ABNORMAL HIGH (ref 70–99)

## 2024-05-29 LAB — TROPONIN I (HIGH SENSITIVITY): Troponin I (High Sensitivity): 13 ng/L (ref <18)

## 2024-05-29 LAB — I-STAT CG4 LACTIC ACID, ED: Lactic Acid, Venous: 1.2 mmol/L (ref 0.5–1.9)

## 2024-05-29 MED ORDER — ALBUTEROL SULFATE (2.5 MG/3ML) 0.083% IN NEBU
2.5000 mg | INHALATION_SOLUTION | Freq: Four times a day (QID) | RESPIRATORY_TRACT | Status: DC | PRN
  Filled 2024-05-29: qty 3

## 2024-05-29 MED ORDER — INSULIN ASPART 100 UNIT/ML IJ SOLN
0.0000 [IU] | Freq: Every day | INTRAMUSCULAR | Status: DC
  Administered 2024-05-29: 2 [IU] via SUBCUTANEOUS
  Filled 2024-05-29: qty 2

## 2024-05-29 MED ORDER — TAMSULOSIN HCL 0.4 MG PO CAPS
0.4000 mg | ORAL_CAPSULE | Freq: Every day | ORAL | Status: DC
  Administered 2024-05-29 – 2024-05-30 (×2): 0.4 mg via ORAL
  Filled 2024-05-29 (×2): qty 1

## 2024-05-29 MED ORDER — VITAMIN D 25 MCG (1000 UNIT) PO TABS
5000.0000 [IU] | ORAL_TABLET | ORAL | Status: DC
  Administered 2024-05-30: 5000 [IU] via ORAL
  Filled 2024-05-29 (×2): qty 5

## 2024-05-29 MED ORDER — LOSARTAN POTASSIUM 50 MG PO TABS
100.0000 mg | ORAL_TABLET | Freq: Every day | ORAL | Status: DC
  Administered 2024-05-30 – 2024-05-31 (×2): 100 mg via ORAL
  Filled 2024-05-29 (×3): qty 2

## 2024-05-29 MED ORDER — INSULIN ASPART 100 UNIT/ML IJ SOLN: 0.0000 [IU] | Freq: Three times a day (TID) | INTRAMUSCULAR | Status: DC

## 2024-05-29 MED ORDER — PREDNISONE 10 MG PO TABS
40.0000 mg | ORAL_TABLET | Freq: Every day | ORAL | Status: DC
  Administered 2024-05-30 – 2024-05-31 (×2): 40 mg via ORAL
  Filled 2024-05-29 (×2): qty 4

## 2024-05-29 MED ORDER — ENOXAPARIN SODIUM 40 MG/0.4ML IJ SOSY
40.0000 mg | PREFILLED_SYRINGE | INTRAMUSCULAR | Status: DC
  Administered 2024-05-29 – 2024-05-30 (×2): 40 mg via SUBCUTANEOUS
  Filled 2024-05-29 (×2): qty 0.4

## 2024-05-29 MED ORDER — BENZONATATE 100 MG PO CAPS
100.0000 mg | ORAL_CAPSULE | Freq: Two times a day (BID) | ORAL | Status: DC | PRN
  Administered 2024-05-29 – 2024-05-31 (×2): 100 mg via ORAL
  Filled 2024-05-29 (×2): qty 1

## 2024-05-29 MED ORDER — GUAIFENESIN ER 600 MG PO TB12
600.0000 mg | ORAL_TABLET | Freq: Two times a day (BID) | ORAL | Status: DC
  Administered 2024-05-29 – 2024-05-31 (×4): 600 mg via ORAL
  Filled 2024-05-29 (×4): qty 1

## 2024-05-29 MED ORDER — VITAMIN C 500 MG PO TABS
1000.0000 mg | ORAL_TABLET | Freq: Every day | ORAL | Status: DC
  Administered 2024-05-29 – 2024-05-31 (×3): 1000 mg via ORAL
  Filled 2024-05-29 (×3): qty 2

## 2024-05-29 MED ORDER — ACETAMINOPHEN 325 MG PO TABS: 650.0000 mg | ORAL_TABLET | Freq: Four times a day (QID) | ORAL | Status: DC | PRN

## 2024-05-29 MED ORDER — METOPROLOL TARTRATE 25 MG PO TABS
50.0000 mg | ORAL_TABLET | Freq: Two times a day (BID) | ORAL | Status: DC
  Administered 2024-05-29 – 2024-05-31 (×4): 50 mg via ORAL
  Filled 2024-05-29 (×4): qty 2

## 2024-05-29 MED ORDER — METHYLPREDNISOLONE SODIUM SUCC 125 MG IJ SOLR
125.0000 mg | Freq: Once | INTRAMUSCULAR | Status: AC
  Administered 2024-05-29: 125 mg via INTRAVENOUS
  Filled 2024-05-29: qty 2

## 2024-05-29 MED ORDER — ALBUTEROL SULFATE (2.5 MG/3ML) 0.083% IN NEBU
10.0000 mg/h | INHALATION_SOLUTION | Freq: Once | RESPIRATORY_TRACT | Status: AC
  Administered 2024-05-29: 10 mg/h via RESPIRATORY_TRACT
  Filled 2024-05-29: qty 12

## 2024-05-29 MED ORDER — TAMSULOSIN HCL 0.4 MG PO CAPS: 0.4000 mg | ORAL_CAPSULE | Freq: Every day | ORAL | Status: DC

## 2024-05-29 MED ORDER — IPRATROPIUM-ALBUTEROL 0.5-2.5 (3) MG/3ML IN SOLN
3.0000 mL | Freq: Once | RESPIRATORY_TRACT | Status: AC
  Administered 2024-05-29: 3 mL via RESPIRATORY_TRACT
  Filled 2024-05-29: qty 3

## 2024-05-29 MED ORDER — IPRATROPIUM-ALBUTEROL 0.5-2.5 (3) MG/3ML IN SOLN
3.0000 mL | Freq: Four times a day (QID) | RESPIRATORY_TRACT | Status: DC | PRN
  Administered 2024-05-30 – 2024-05-31 (×2): 3 mL via RESPIRATORY_TRACT
  Filled 2024-05-29 (×2): qty 3

## 2024-05-29 MED ORDER — LEVOTHYROXINE SODIUM 50 MCG PO TABS
50.0000 ug | ORAL_TABLET | Freq: Every day | ORAL | Status: DC
  Administered 2024-05-30 – 2024-05-31 (×2): 50 ug via ORAL
  Filled 2024-05-29 (×2): qty 1

## 2024-05-29 MED ORDER — ACETAMINOPHEN 650 MG RE SUPP: 650.0000 mg | Freq: Four times a day (QID) | RECTAL | Status: DC | PRN

## 2024-05-29 MED ORDER — CLOPIDOGREL BISULFATE 75 MG PO TABS
75.0000 mg | ORAL_TABLET | Freq: Every day | ORAL | Status: DC
  Administered 2024-05-30 – 2024-05-31 (×2): 75 mg via ORAL
  Filled 2024-05-29 (×3): qty 1

## 2024-05-29 NOTE — ED Provider Notes (Signed)
 Care of the patient assumed at signout. 4:10 PM As exam patient continues to require his typical supplemental oxygen , has persistent increased work of breathing, though he notes he has improved markedly since arrival. Findings reviewed, negative for pneumonia, positive for RSV, likely contributing to his COPD exacerbation. With persistent tachypnea, tachycardia, wheezing, patient will be admitted for further monitoring, management, frequency of nebs.   Garrick Charleston, MD 05/29/24 [phone]

## 2024-05-29 NOTE — ED Notes (Signed)
 CCMD Called

## 2024-05-29 NOTE — ED Notes (Signed)
Xray a bedside

## 2024-05-29 NOTE — ED Provider Notes (Incomplete)
 [place] EMERGENCY DEPARTMENT AT [facility] Provider Note   CSN: [id number] Arrival date & time: 05/29/24  1221     Patient presents with: Shortness of Breath   Alex Edwards is a 88 y.o. male.  {Add pertinent medical, surgical, social history, OB history to HPI:2922} 88 year old male with prior medical history as detailed below presents for evaluation.  Patient with known history of lung cancer, emphysema, chronic FiO2 requirement of 2 to 3 L.  He reports increased shortness of breath x 2 to 3 days.  He reports increased wheezing.  He denies fever.  He reports mildly increased cough.  He denies chest pain.  The history is provided by the patient and medical records.       Prior to Admission medications   Medication Sig Start Date End Date Taking? Authorizing Provider  albuterol  (VENTOLIN  HFA) 108 (90 Base) MCG/ACT inhaler Inhale 2 puffs into the lungs every 6 (six) hours as needed for wheezing or shortness of breath. 01/17/24   Joyce Norleen BROCKS, MD  amoxicillin -clavulanate (AUGMENTIN ) 875-125 MG tablet Take 1 tablet by mouth 2 (two) times daily. Patient not taking: Reported on 05/29/2024 05/21/24   Randol Dawes, MD  Ascorbic Acid  (VITAMIN C ) 1000 MG tablet Take 1,000 mg by mouth daily. Patient not taking: Reported on 05/29/2024    [provider]  Blood Glucose Monitoring Suppl (ACCU-CHEK GUIDE) w/Device KIT 1 each by Does not apply route as directed. DX: E11.9 02/09/24   Joyce Norleen BROCKS, MD  Cholecalciferol (VITAMIN D-3) 125 MCG (5000 UT) TABS Take 1 tablet by mouth every other day. Patient not taking: Reported on 05/29/2024    [provider]  clopidogrel  (PLAVIX ) 75 MG tablet Take 1 tablet (75 mg total) by mouth daily. 03/13/24   Joyce Norleen BROCKS, MD  hydrochlorothiazide  (MICROZIDE ) 12.5 MG capsule Take 1 capsule (12.5 mg total) by mouth daily. 12/26/23   Joyce Norleen BROCKS, MD  ketoconazole  (NIZORAL ) 2 % cream Apply topically daily. 01/17/24   Lalonde,  John C, MD  levothyroxine  (SYNTHROID ) 50 MCG tablet Take 1 tablet (50 mcg total) by mouth daily before breakfast. 05/21/24   Lalonde, John C, MD  loratadine  (CLARITIN ) 10 MG tablet Take 10 mg by mouth daily. Patient not taking: Reported on 05/29/2024    [provider]  losartan  (COZAAR ) 100 MG tablet Take 1 tablet (100 mg total) by mouth daily. 01/17/24   Joyce Norleen BROCKS, MD  metFORMIN  (GLUCOPHAGE -XR) 750 MG 24 hr tablet Take 1 tablet (750 mg total) by mouth daily with breakfast. 05/21/24   Lalonde, John C, MD  metoprolol  tartrate (LOPRESSOR ) 50 MG tablet TAKE ONE TABLET BY MOUTH TWICE DAILY 02/24/24   Lalonde, John C, MD  [facility] DELICA LANCETS FINE MISC Patient is to test one time a day DX: E11.9 07/30/16   Joyce Norleen BROCKS, MD  predniSONE  (DELTASONE ) 5 MG tablet Take 1 tablet (5 mg total) by mouth daily with breakfast. 01/17/24   Joyce Norleen BROCKS, MD  tamsulosin  (FLOMAX ) 0.4 MG CAPS capsule TAKE ONE CAPSULE BY MOUTH DAILY AFTER SUPPER 03/26/24   Lalonde, John C, MD  triamcinolone  cream (KENALOG ) 0.1 % APPLY TO AFFECTED AREA TWICE A DAY. Patient not taking: Reported on 05/29/2024 10/14/20   Joyce Norleen BROCKS, MD  rosuvastatin  (CRESTOR ) 10 MG tablet Take 1 tablet (10 mg total) by mouth at bedtime. 01/25/23 03/07/23  Magda Debby SAILOR, MD    Allergies: Paclitaxel , Vancomycin , Amlodipine , and Latex    Review of Systems  All other systems reviewed and are negative.   Updated Vital Signs BP (!) 151/61 (BP Location: Left Arm)   Pulse 79   Temp 98.2 F (36.8 C)   Resp (!) 30   SpO2 98%   Physical Exam Vitals and nursing note reviewed.  Constitutional:      General: He is not in acute distress.    Appearance: He is well-developed.  HENT:     Head: Normocephalic and atraumatic.  Eyes:     Conjunctiva/sclera: Conjunctivae normal.  Cardiovascular:     Rate and Rhythm: Normal rate and regular rhythm.     Heart sounds: No murmur heard. Pulmonary:     Effort: Pulmonary effort is normal. No  respiratory distress.     Comments: Scattered bilateral expiratory wheezes in all lung field Abdominal:     Palpations: Abdomen is soft.     Tenderness: There is no abdominal tenderness.  Musculoskeletal:        General: No swelling.     Cervical back: Neck supple.  Skin:    General: Skin is warm and dry.     Capillary Refill: Capillary refill takes less than 2 seconds.  Neurological:     Mental Status: He is alert.  Psychiatric:        Mood and Affect: Mood normal.     (all labs ordered are listed, but only abnormal results are displayed) Labs Reviewed  RESP PANEL BY RT-PCR (RSV, FLU A&B, COVID)  RVPGX2  CBC WITH DIFFERENTIAL/PLATELET  BRAIN NATRIURETIC PEPTIDE  COMPREHENSIVE METABOLIC PANEL WITH GFR  URINALYSIS, W/ REFLEX TO CULTURE (INFECTION SUSPECTED)  I-STAT CHEM 8, ED  I-STAT CG4 LACTIC ACID, ED  TROPONIN I (HIGH SENSITIVITY)    EKG: None  Radiology: No results found.  {Document cardiac monitor, telemetry assessment procedure when appropriate:32947} Procedures   Medications Ordered in the ED  methylPREDNISolone  sodium succinate (SOLU-MEDROL ) 125 mg/2 mL injection 125 mg (has no administration in time range)  ipratropium-albuterol  (DUONEB) 0.5-2.5 (3) MG/3ML nebulizer solution 3 mL (3 mLs Nebulization Given 05/29/24 1317)      {Click here for ABCD2, HEART and other calculators REFRESH Note before signing:1}                              Medical Decision Making Amount and/or Complexity of Data Reviewed Labs: ordered. Radiology: ordered.  Risk Prescription drug management.   ***  {Document critical care time when appropriate  Document review of labs and clinical decision tools ie CHADS2VASC2, etc  Document your independent review of radiology images and any outside records  Document your discussion with family members, caretakers and with consultants  Document social determinants of health affecting pt's care  Document your decision making why or why  not admission, treatments were needed:32947:::1}   Final diagnoses:  None    ED Discharge Orders     None

## 2024-05-29 NOTE — Progress Notes (Signed)
   Subjective:    Patient ID: Alex Edwards, male    DOB: 01-11-1936, 88 y.o.   MRN: [id number]  Discussed the use of AI scribe software for clinical note transcription with the patient, who gave verbal consent to proceed.  History of Present Illness   Alex Edwards is an 88 year old male who presents with shortness of breath and desaturation.  He has been experiencing shortness of breath and increased respiratory rate, which started yesterday and has been gradually worsening over the past couple of days. His oxygen  levels drop significantly with activity, reaching as low as 81% during exertion such as showering, which took him two hours due to the need to rest frequently. He describes his breathing as 'huffing and puffing.'  He discusses a recent episode of hematuria, which began on Thanksgiving Day with blood clots in his urine. The hematuria resolved by Saturday afternoon, and his urine has been clear since then. He was prescribed Augmentin  but discontinued the antibiotic after taking two doses.  The culture was negative.  He mentions experiencing severe pain in his lower back on the left side, which he describes as 'the worst pain you've ever had.' He has not undergone imaging for this pain but inquires about the possibility of detecting kidney stones via ultrasound. No current hematuria and notes improvement in urine clarity since Saturday.  He is concerned about the cost of the CT scan versus ultrasound.          Review of Systems     Objective:    Physical Exam Physical Exam   VITALS: RR- 28 GENERAL: Pale appearance. CHEST: Wheezing and squeaking on auscultation. Respiratory rate is 28 breaths per minute.            Assessment & Plan:  Assessment and Plan    Acute hypoxemic respiratory distress Acute hypoxemic respiratory distress with tachypnea and oxygen  desaturation. Wheezing and pallor noted. Immediate hospital evaluation required. - Referred to emergency room for  evaluation and management. - Instructed to inform emergency room staff about oxygen  use and desaturation with activity.  Left flank pain, possible nephrolithiasis Left flank pain possibly due to nephrolithiasis. Pain and hematuria resolved. No imaging performed. [employer] evaluation to assess flank pain and possible nephrolithiasis.

## 2024-05-29 NOTE — H&P (Signed)
 History and Physical    Alex Edwards FMW:[id number] DOB: [birth year] DOA: 05/29/2024  DOS: the patient was seen and examined on 05/29/2024  PCP: Joyce Norleen BROCKS, MD   Patient coming from: Home  I have personally briefly reviewed patient's old medical records in [facility]  88 y/o M with PMH of lung cancer s/p chemotherapy finished in 2022 does not follow with oncology, chronic hypoxic respiratory failure on 2-3L oxygen , recent episode of symptomatic nephrolithiasis with transient hematuria status post 2 days on antibiotics, CVA on plavix , right coronary artery stenosis, T2DM, HTN, recent AKI on CKD (baseline around 2), and remote tobacco use who presents with progressive shortness of breath of 1 day duration. History was obtained from patient at bedside who reports being in his usual state of health until yesterday when he woke up with shortness of breath. This progressively worsened to the point that he had a hard time catching his breath today and so decided to come to hospital for further evaluation. Reports having associated occasional cough productive of clear sputum. Has chronic night sweats that haven't worsened. Denies chest pain, palpitations, fever, chills, sick contacts, appetite or weight changes.    ED Course: Patient noted to be in respiratory distress given albuterol  neb once, duoneb once and IV solumedrol 125mg  once. Patient remained near baseline oxygen  but continued to experience considerable shortness of breath so decision was made for admission.  Review of Systems:  Review of Systems  Constitutional:  Negative for chills, fever and malaise/fatigue.  HENT:  Negative for congestion, sinus pain and sore throat.   Respiratory:  Positive for cough, sputum production, shortness of breath and wheezing. Negative for hemoptysis.   Cardiovascular:  Negative for chest pain, palpitations and leg swelling.  Gastrointestinal:  Negative for abdominal pain, constipation, diarrhea,  nausea and vomiting.  Genitourinary:  Negative for dysuria, hematuria and urgency.  Neurological:  Negative for dizziness.    Past Medical History:  Diagnosis Date   BCE (basal cell epithelioma)    Bruises easily    on hands   Cough    smokers cough or perfurmes   Degeneration macular    right eye   Diabetes mellitus without complication (HCC)    Dyslipidemia    ED (erectile dysfunction)    Hx of adenomatous colonic polyps    Hyperlipidemia    Hypertension    borderline   Obesity    Psoriasis    Seasonal allergies    Smoker    former    Past Surgical History:  Procedure Laterality Date   BRONCHIAL BRUSHINGS  11/18/2020   Procedure: BRONCHIAL BRUSHINGS;  Surgeon: Meade Verdon RAMAN, MD;  Location: [facility];  Service: Pulmonary;;   BRONCHIAL NEEDLE ASPIRATION BIOPSY  11/18/2020   Procedure: BRONCHIAL NEEDLE ASPIRATION BIOPSIES;  Surgeon: Meade Verdon RAMAN, MD;  Location: [facility];  Service: Pulmonary;;   BRONCHIAL WASHINGS  11/18/2020   Procedure: BRONCHIAL WASHINGS;  Surgeon: Meade Verdon RAMAN, MD;  Location: [facility];  Service: Pulmonary;;   CATARACT EXTRACTION, BILATERAL Bilateral 2013   COLONOSCOPY  2005   [facility]   ENDOBRONCHIAL ULTRASOUND N/A 11/18/2020   Procedure: ENDOBRONCHIAL ULTRASOUND;  Surgeon: Meade Verdon RAMAN, MD;  Location: [facility];  Service: Pulmonary;  Laterality: N/A;   POLYPECTOMY     SKIN CANCER EXCISION Right 1993   under eye-MOHS, freeze multiple places freq   TONSILLECTOMY  as child   TOTAL HIP ARTHROPLASTY Left 01/04/2014   Procedure: LEFT TOTAL HIP ARTHROPLASTY ANTERIOR APPROACH;  Surgeon: Lonni CINDERELLA Poli, MD;  Location: [facility];  Service: Orthopedics;  Laterality: Left;   TOTAL HIP ARTHROPLASTY Left 02/14/2014   Procedure: Irrigation and Debridement left hip;  Surgeon: Lonni CINDERELLA Poli, MD;  Location: [facility];  Service: Orthopedics;  Laterality: Left;     reports that he quit smoking about 10 years ago. His smoking use included  cigarettes. He started smoking about 60 years ago. He has a 50 pack-year smoking history. He has never used smokeless tobacco. He reports that he does not drink alcohol  and does not use drugs.  Allergies  Allergen Reactions   Paclitaxel  Shortness Of Breath    SOB and light headedness, flushing and 8/10 pain in left flank Pepcid  and Solumedrol administered.  Able to tolerate remainder of Taxol  infusion   Vancomycin      Red Man Syndrome    Amlodipine  Swelling   Latex Other (See Comments)    possibly allergic- dry, itching, rash    Family History  Problem Relation Age of Onset   Heart disease Brother     Prior to Admission medications   Medication Sig Start Date End Date Taking? Authorizing Provider  albuterol  (VENTOLIN  HFA) 108 (90 Base) MCG/ACT inhaler Inhale 2 puffs into the lungs every 6 (six) hours as needed for wheezing or shortness of breath. 01/17/24  Yes Joyce Norleen BROCKS, MD  Ascorbic Acid  (VITAMIN C ) 1000 MG tablet Take 1,000 mg by mouth daily.   Yes [provider]  Blood Glucose Monitoring Suppl (ACCU-CHEK GUIDE) w/Device KIT 1 each by Does not apply route as directed. DX: E11.9 02/09/24  Yes Joyce Norleen BROCKS, MD  Cholecalciferol (VITAMIN D-3) 125 MCG (5000 UT) TABS Take 1 tablet by mouth every other day.   Yes [provider]  clopidogrel  (PLAVIX ) 75 MG tablet Take 1 tablet (75 mg total) by mouth daily. 03/13/24  Yes Joyce Norleen BROCKS, MD  hydrochlorothiazide  (MICROZIDE ) 12.5 MG capsule Take 1 capsule (12.5 mg total) by mouth daily. 12/26/23  Yes Joyce Norleen BROCKS, MD  ketoconazole  (NIZORAL ) 2 % cream Apply topically daily. Patient taking differently: Apply 1 Application topically daily as needed for irritation. Groin rash 01/17/24  Yes Joyce Norleen BROCKS, MD  levothyroxine  (SYNTHROID ) 50 MCG tablet Take 1 tablet (50 mcg total) by mouth daily before breakfast. 05/21/24  Yes Joyce Norleen BROCKS, MD  loratadine  (CLARITIN ) 10 MG tablet Take 10 mg by mouth daily.   Yes  [provider]  losartan  (COZAAR ) 100 MG tablet Take 1 tablet (100 mg total) by mouth daily. 01/17/24  Yes Joyce Norleen BROCKS, MD  metFORMIN  (GLUCOPHAGE -XR) 750 MG 24 hr tablet Take 1 tablet (750 mg total) by mouth daily with breakfast. 05/21/24  Yes Joyce Norleen BROCKS, MD  metoprolol  tartrate (LOPRESSOR ) 50 MG tablet TAKE ONE TABLET BY MOUTH TWICE DAILY 02/24/24  Yes Lalonde, John C, MD  [facility] DELICA LANCETS FINE MISC Patient is to test one time a day DX: E11.9 07/30/16  Yes Joyce Norleen BROCKS, MD  predniSONE  (DELTASONE ) 5 MG tablet Take 1 tablet (5 mg total) by mouth daily with breakfast. Patient taking differently: Take 5 mg by mouth daily with breakfast. Takes for bilateral shoulder arthritis 01/17/24  Yes Joyce Norleen BROCKS, MD  tamsulosin  (FLOMAX ) 0.4 MG CAPS capsule TAKE ONE CAPSULE BY MOUTH DAILY AFTER SUPPER 03/26/24  Yes Lalonde, John C, MD  triamcinolone  cream (KENALOG ) 0.1 % APPLY TO AFFECTED AREA TWICE A DAY. 10/14/20  Yes Joyce Norleen BROCKS, MD  rosuvastatin  (CRESTOR ) 10 MG tablet  Take 1 tablet (10 mg total) by mouth at bedtime. 01/25/23 03/07/23  Magda Debby SAILOR, MD    Physical Exam: Vitals:   05/29/24 1600 05/29/24 1619 05/29/24 1700 05/29/24 1724  BP: (!) 141/104 116/72 (!) 120/55   Pulse: 88 86 82   Resp: (!) 26 18 (!) 25   Temp:    98.4 F (36.9 C)  TempSrc:    Oral  SpO2: 97% 98% 98%   Weight:      Height:        Physical Exam Vitals and nursing note reviewed.  Constitutional:      General: He is not in acute distress.    Appearance: He is obese. He is ill-appearing.     Comments: frail  HENT:     Head: Normocephalic and atraumatic.     Mouth/Throat:     Mouth: Mucous membranes are moist.  Cardiovascular:     Rate and Rhythm: Normal rate and regular rhythm.     Pulses: Normal pulses.     Heart sounds: Normal heart sounds.  Pulmonary:     Effort: Respiratory distress present.     Breath sounds: Normal breath sounds. No wheezing.  Abdominal:     General: Bowel  sounds are normal. There is no distension.     Palpations: Abdomen is soft.     Tenderness: There is no abdominal tenderness.  Musculoskeletal:     Right lower leg: No edema.     Left lower leg: No edema.  Neurological:     Mental Status: He is alert. Mental status is at baseline.      Labs on Admission: I have personally reviewed following labs and imaging studies  CBC: Recent Labs  Lab 05/29/24 1344 05/29/24 1420  WBC 11.9*  --   NEUTROABS 10.3*  --   HGB 11.3* 11.9*  HCT 34.4* 35.0*  MCV 95.6  --   PLT 290  --    Basic Metabolic Panel: Recent Labs  Lab 05/29/24 1344 05/29/24 1420  NA 129* 131*  K 4.9 4.6  CL 96* 97*  CO2 22  --   GLUCOSE 108* 114*  BUN 28* 26*  CREATININE 1.72* 1.90*  CALCIUM  8.9  --    GFR: Estimated Creatinine Clearance: 35.5 mL/min (A) (by C-G formula based on SCr of 1.9 mg/dL (H)). Liver Function Tests: Recent Labs  Lab 05/29/24 1344  AST 19  ALT 13  ALKPHOS 47  BILITOT 0.4  PROT 6.7  ALBUMIN 3.4*   No results for input(s): LIPASE, AMYLASE in the last 168 hours. No results for input(s): AMMONIA in the last 168 hours. Coagulation Profile: No results for input(s): INR, PROTIME in the last 168 hours. Cardiac Enzymes: Recent Labs  Lab 05/29/24 1344  TROPONINIHS 13   ProBNP, BNP (last 5 results) No results for input(s): PROBNP, BNP in the last 8760 hours. HbA1C: No results for input(s): HGBA1C in the last 72 hours. CBG: No results for input(s): GLUCAP in the last 168 hours. Lipid Profile: No results for input(s): CHOL, HDL, LDLCALC, TRIG, CHOLHDL, LDLDIRECT in the last 72 hours. Thyroid  Function Tests: No results for input(s): TSH, T4TOTAL, FREET4, T3FREE, THYROIDAB in the last 72 hours. Anemia Panel: No results for input(s): VITAMINB12, FOLATE, FERRITIN, TIBC, IRON, RETICCTPCT in the last 72 hours. Urine analysis:    Component Value Date/Time   COLORURINE YELLOW  05/29/2024 1610   APPEARANCEUR CLEAR 05/29/2024 1610   LABSPEC 1.011 05/29/2024 1610   LABSPEC 1.010 05/21/2024 1332  PHURINE 5.0 05/29/2024 1610   GLUCOSEU NEGATIVE 05/29/2024 1610   HGBUR MODERATE (A) 05/29/2024 1610   BILIRUBINUR NEGATIVE 05/29/2024 1610   BILIRUBINUR negative 05/21/2024 1332   KETONESUR NEGATIVE 05/29/2024 1610   PROTEINUR NEGATIVE 05/29/2024 1610   UROBILINOGEN 0.2 02/12/2014 1427   NITRITE NEGATIVE 05/29/2024 1610   LEUKOCYTESUR TRACE (A) 05/29/2024 1610    Radiological Exams on Admission: I have personally reviewed images DG Chest Port 1 View Result Date: 05/29/2024 CLINICAL DATA:  Shortness of breath. EXAM: PORTABLE CHEST 1 VIEW COMPARISON:  05/21/2024 FINDINGS: The heart size and mediastinal contours are within normal limits. Stable chronic lung disease with prominent bilateral parenchymal scarring. There is no evidence of pulmonary edema, consolidation, pneumothorax, nodule or pleural fluid. The visualized skeletal structures are unremarkable. IMPRESSION: Stable chronic lung disease with prominent bilateral parenchymal scarring. No acute findings. Electronically Signed   By: Marcey Moan M.D.   On: 05/29/2024 13:36    EKG: My personal interpretation of EKG shows: NSR, Qtc 407    Assessment/Plan Principal Problem:   RSV (respiratory syncytial virus infection)    Assessment and Plan:  88 y/o M with PMH of lung cancer s/p chemotherapy finished in 2022 does not follow with oncology, chronic hypoxic respiratory failure on 2-3L oxygen , recent episode of symptomatic nephrolithiasis with transient hematuria status post 2 days on antibiotics, CVA on plavix , right coronary artery stenosis, T2DM, HTN, recent AKI on CKD (baseline around 2), and remote tobacco use who presents with progressive shortness of breath of 1 day duration.   Acute respiratory syncytial infection Possible acute COPD exacerbation Presents with cough, shortness of breath CXR with no  chronic parenchymal scarring, no acute pathology Resp viral panel positive for RSV Given albuterol , duoneb and solumedrol in the ED Start duoneb q6h ATC Start albuterol  PRN  Start prednisone  40mg  daily to complete 5 day course Start guaifenesin Start tessalon perles PRN for cough Encourage oral intake/hydration  Hypoxia Currently on 3.5L oxygen , at home on 2-3L oxygen  Not far from baseline Likely worsened in the setting of acute RSV infection Conservative care as elsewhere Wean oxygen  as tolerated  Mild hyponatremia Na 131 < 132, baseline within normal limits Asymptomatic at present Etiology unclear Will hold home hydrochlorothiazide  Monitor BMP  T2DM Hold home metformin  Start SSI with FS ACHS  Hx of Carotid artery disease Hx of CVA No residual neurological deficits Cont home plavix   HTN Home home hydrochlorothiazide  given mild hyponatremia Cont home losartan   Bilateral shoulder arthritis Takes prednisone  5mg  daily at home Started on prednisone  40mg  daily as elsewhere  DVT prophylaxis: Lovenox  Code Status: Full Code Family Communication: Patient stated he will update his wife himself.  Disposition Plan: Likely Home  Consults called: None  Admission status: Observation, Med-Surg   Norval Bar, MD [facility] 05/29/2024, 5:28 PM

## 2024-05-29 NOTE — ED Triage Notes (Signed)
 Patient arrives POV, breathing 30-40 times a minute with accessory muscle use, on baseline O2, reports hx of lung cancer and emphysema, unable to speak in complete sentences. Reports that his O2 drops into the 80s if he moves at all, got worse starting yesterday.

## 2024-05-30 DIAGNOSIS — E1165 Type 2 diabetes mellitus with hyperglycemia: Secondary | ICD-10-CM

## 2024-05-30 DIAGNOSIS — R0902 Hypoxemia: Secondary | ICD-10-CM

## 2024-05-30 DIAGNOSIS — J205 Acute bronchitis due to respiratory syncytial virus: Secondary | ICD-10-CM

## 2024-05-30 DIAGNOSIS — J441 Chronic obstructive pulmonary disease with (acute) exacerbation: Secondary | ICD-10-CM | POA: Diagnosis not present

## 2024-05-30 DIAGNOSIS — E871 Hypo-osmolality and hyponatremia: Secondary | ICD-10-CM | POA: Diagnosis not present

## 2024-05-30 LAB — CBC
HCT: 31.1 % — ABNORMAL LOW (ref 39.0–52.0)
Hemoglobin: 10 g/dL — ABNORMAL LOW (ref 13.0–17.0)
MCH: 29.8 pg (ref 26.0–34.0)
MCHC: 32.2 g/dL (ref 30.0–36.0)
MCV: 92.6 fL (ref 80.0–100.0)
Platelets: 287 K/uL (ref 150–400)
RBC: 3.36 MIL/uL — ABNORMAL LOW (ref 4.22–5.81)
RDW: 12.4 % (ref 11.5–15.5)
WBC: 11.2 K/uL — ABNORMAL HIGH (ref 4.0–10.5)
nRBC: 0 % (ref 0.0–0.2)

## 2024-05-30 LAB — BASIC METABOLIC PANEL WITH GFR
Anion gap: 8 (ref 5–15)
BUN: 32 mg/dL — ABNORMAL HIGH (ref 8–23)
CO2: 26 mmol/L (ref 22–32)
Calcium: 8.7 mg/dL — ABNORMAL LOW (ref 8.9–10.3)
Chloride: 99 mmol/L (ref 98–111)
Creatinine, Ser: 1.8 mg/dL — ABNORMAL HIGH (ref 0.61–1.24)
GFR, Estimated: 36 mL/min — ABNORMAL LOW (ref >60)
Glucose, Bld: 132 mg/dL — ABNORMAL HIGH (ref 70–99)
Potassium: 4.9 mmol/L (ref 3.5–5.1)
Sodium: 133 mmol/L — ABNORMAL LOW (ref 135–145)

## 2024-05-30 LAB — GLUCOSE, CAPILLARY
Glucose-Capillary: 126 mg/dL — ABNORMAL HIGH (ref 70–99)
Glucose-Capillary: 132 mg/dL — ABNORMAL HIGH (ref 70–99)
Glucose-Capillary: 148 mg/dL — ABNORMAL HIGH (ref 70–99)
Glucose-Capillary: 136 mg/dL — ABNORMAL HIGH (ref 70–99)

## 2024-05-30 NOTE — Evaluation (Signed)
 Physical Therapy Evaluation Patient Details Name: Alex Edwards MRN: [id number] DOB: June 28, 1936 Today's Date: 05/30/2024  History of Present Illness  88 y.o. male admitted on 12/2 for dyspnea for 2-3 days. He is on 3L [place] at baseline. Labs positive for acute RSV. Med hx significant for lung cancer, emphysema,  DM, colon polyps, obesity, psoriasis, smoker, macular degeneration R eye, basal cell epithelioma, HTN, PMR.  Clinical Impression  PTA, pt living in single story home with 2 STE with wife and son. Pt's son acts as his primary caretaker, and typically drives pt in community as needed. He can perform most ADLs independently, but requires standing or seated rest breaks as he completes them due to inc dyspnea. He states he is on 3L [place] at home, which he has been managing well. Currently, pt is CGA for all mobility except STS, for which he requires minA to power up. Noted slight inc in dyspnea during seated conversation, but pt's SpO2 at 95% throughout session, including while ambulating. Pt appears close to his functional baseline, just stating he feels slight weaker than normal. Following a lengthy discussion, pt agreeable to recommendation of HH PT upon d/c to improve endurance and activity tolerance in home. Acute PT to follow.       If plan is discharge home, recommend the following: A little help with walking and/or transfers;A little help with bathing/dressing/bathroom;Assistance with cooking/housework;Assist for transportation;Help with stairs or ramp for entrance   Can travel by private vehicle        Equipment Recommendations Rollator (4 wheels)  Recommendations for Other Services  OT consult    Functional Status Assessment Patient has had a recent decline in their functional status and demonstrates the ability to make significant improvements in function in a reasonable and predictable amount of time.     Precautions / Restrictions Precautions Precautions: Fall Recall of  Precautions/Restrictions: Intact Restrictions Weight Bearing Restrictions Per Provider Order: No      Mobility  Bed Mobility Overal bed mobility: Needs Assistance Bed Mobility: Supine to Sit     Supine to sit: Contact guard     General bed mobility comments: Inc time and CGA for safety    Transfers Overall transfer level: Needs assistance Equipment used: Rolling walker (2 wheels) Transfers: Sit to/from Stand Sit to Stand: Min assist           General transfer comment: MinA for power up into standing. Describes mild lightheadedness upon standing, which slowly improved.    Ambulation/Gait Ambulation/Gait assistance: Contact guard assist Gait Distance (Feet): 140 Feet Assistive device: Rolling walker (2 wheels) Gait Pattern/deviations: Step-through pattern, Decreased stride length, Knee hyperextension - left, Trunk flexed Gait velocity: Dec Gait velocity interpretation: 1.31 - 2.62 ft/sec, indicative of limited community ambulator   General Gait Details: Overall steady gait with RW, with pt stating he feels a little weaker than normal. No rest breaks needed, but pt stating his legs were getting tired as we ambulated back to the room. On 3L [place], SpO2 remained between 95-97% throughout gait.  Stairs            Wheelchair Mobility     Tilt Bed    Modified Rankin (Stroke Patients Only)       Balance Overall balance assessment: Needs assistance Sitting-balance support: Feet supported, No upper extremity supported Sitting balance-Leahy Scale: Fair     Standing balance support: Bilateral upper extremity supported, During functional activity, Reliant on assistive device for balance Standing balance-Leahy Scale: Poor Standing balance  comment: Reliant on external support                             Pertinent Vitals/Pain Pain Assessment Pain Assessment: No/denies pain    Home Living Family/patient expects to be discharged to:: Private  residence Living Arrangements: Spouse/significant other;Children Available Help at Discharge: Family;Available 24 hours/day Type of Home: House Home Access: Stairs to enter Entrance Stairs-Rails: None Entrance Stairs-Number of Steps: 2   Home Layout: One level (+basement, which he does not go down to) Home Equipment: Cane - single Librarian, Academic (2 wheels);Shower seat Additional Comments: Son acts as his caretaker. Drives pt, goes to grocery store for pt and wife, does their yardwork. Is able to dress self, occasionally guards pt while walking. Only leaves home when going to doctor's appointment. At baseline, is on 3L O2 [place], and has been since 2022. States he feels comfortable managing O2 at home. Feels he gets out of breath withinc talking, standing up, etc.    Prior Function Prior Level of Function : Needs assist;History of Falls (last six months)             Mobility Comments: Pt uses an AD intermittently, typically a SPC. Has a RW, but does use it. [place] about one month ago, stood got dizzy and fell. Did not seriously injure himself. States he only walks around the house occasionally and he is fairly sedentary. ADLs Comments: States he must do ADLs in shifts so that he can keep his O2 up.     Extremity/Trunk Assessment   Upper Extremity Assessment Upper Extremity Assessment: Generalized weakness    Lower Extremity Assessment Lower Extremity Assessment: Generalized weakness    Cervical / Trunk Assessment Cervical / Trunk Assessment: Kyphotic  Communication   Communication Communication: No apparent difficulties    Cognition Arousal: Alert Behavior During Therapy: WFL for tasks assessed/performed   PT - Cognitive impairments: No apparent impairments                         Following commands: Intact       Cueing Cueing Techniques: Verbal cues     General Comments General comments (skin integrity, edema, etc.): Appears close to his functional  baseline, states he feels slightly weaker than normal    Exercises     Assessment/Plan    PT Assessment Patient needs continued PT services  PT Problem List Decreased strength;Decreased activity tolerance;Decreased balance;Decreased mobility;Cardiopulmonary status limiting activity;Decreased knowledge of use of DME;Decreased safety awareness       PT Treatment Interventions DME instruction;Gait training;Stair training;Functional mobility training;Therapeutic activities;Therapeutic exercise;Balance training;Neuromuscular re-education;Cognitive remediation;Patient/family education;Manual techniques;Modalities    PT Goals (Current goals can be found in the Care Plan section)  Acute Rehab PT Goals Patient Stated Goal: to be able to walk further PT Goal Formulation: With patient Time For Goal Achievement: 06/13/24 Potential to Achieve Goals: Good    Frequency Min 2X/week     Co-evaluation               AM-PAC PT 6 Clicks Mobility  Outcome Measure Help needed turning from your back to your side while in a flat bed without using bedrails?: A Little Help needed moving from lying on your back to sitting on the side of a flat bed without using bedrails?: A Little Help needed moving to and from a bed to a chair (including a wheelchair)?: A Little Help needed standing up  from a chair using your arms (e.g., wheelchair or bedside chair)?: A Little Help needed to walk in hospital room?: A Little Help needed climbing 3-5 steps with a railing? : A Little 6 Click Score: 18    End of Session Equipment Utilized During Treatment: Gait belt Activity Tolerance: Patient tolerated treatment well Patient left: in bed;with call bell/phone within reach;with bed alarm set Nurse Communication: Mobility status PT Visit Diagnosis: Other abnormalities of gait and mobility (R26.89);Difficulty in walking, not elsewhere classified (R26.2);Muscle weakness (generalized) (M62.81)    Time: 1030-1103 PT  Time Calculation (min) (ACUTE ONLY): 33 min   Charges:   PT Evaluation $PT Eval Low Complexity: 1 Low PT Treatments $Therapeutic Activity: 8-22 mins PT General Charges $$ ACUTE PT VISIT: 1 Visit         Alex Edwards, SPT   Alex Edwards 05/30/2024, 1:34 PM

## 2024-05-30 NOTE — Care Management Obs Status (Signed)
 MEDICARE OBSERVATION STATUS NOTIFICATION   Patient Details  Name: Rashaud Ybarbo MRN: [id number] Date of Birth: 28-Jan-1936   Medicare Observation Status Notification Given:  Yes    Juhi Lagrange 05/30/2024, 12:36 PM

## 2024-05-30 NOTE — Progress Notes (Signed)
 Progress Note   Patient: Alex Edwards FMW:[id number] DOB: 1935/11/21 DOA: 05/29/2024     0 DOS: the patient was seen and examined on 05/30/2024   Brief hospital course: Alex Edwards is a 88 y/o M with PMH of lung cancer s/p chemotherapy finished in 2022 does not follow with oncology, chronic hypoxic respiratory failure on 2-3L oxygen , recent episode of symptomatic nephrolithiasis with transient hematuria status post 2 days on antibiotics, CVA on plavix , right coronary artery stenosis, T2DM, HTN, recent AKI on CKD (baseline around 2), and remote tobacco use presented with progressive shortness of breath of 1 day duration.  Patient was admitted to the hospitalist service for acute COPD exacerbation, RSV infection.  Assessment and Plan: Acute COPD exacerbation RSV infection- Patient does have cough, shortness of breath, generalized weakness. Continue DuoNebs Q6 hourly, antitussive  medications. Continue prednisone  40 mg total 5 days. Encourage incentive spirometry, out of bed to chair. Continue to wean oxygen  to baseline 2 to 3 L oxygen  at home.  Hypoxia: In the setting of COPD, RSV infection. Continue to wean supplemental oxygen  to 2 to 3 L. Will get PT OT evaluation for patient to ambulate with pulse ox.  Hyponatremia: Seems chronic, baseline. Hold HCTZ.  Type 2 diabetes mellitus: Continue Accu-Cheks, sliding scale insulin . Hold home metformin  therapy.  History of carotid artery stenosis CVA with no residual neurodeficits. Continue home Plavix  therapy.  Hyperlipidemia- Continue statin therapy.  Hypertension- Hold hydrochlorothiazide , continue losartan  therapy.  Bilateral shoulder arthritis- Continue prednisone  40 mg for 5 days and then restart home 5 mg dose at home.       Out of bed to chair. Incentive spirometry. Nursing supportive care. Fall, aspiration precautions. Diet:  Diet Orders (From admission, onward)     Start     Ordered   05/29/24 2057  Diet heart  healthy/carb modified Room service appropriate? Yes; Fluid consistency: Thin  Diet effective now       Question Answer Comment  Diet-HS Snack? Nothing   Room service appropriate? Yes   Fluid consistency: Thin      05/29/24 2056           DVT prophylaxis: enoxaparin  (LOVENOX ) injection 40 mg Start: 05/29/24 2200 SCDs Start: 05/29/24 2057  Level of care: Med-Surg   Code Status: Full Code  Subjective: Patient is seen and examined today morning.  He is sitting on edge of bed, talking to his family over phone.  Does have shortness of breath with exertion, productive cough.  Eating fair.  Physical Exam: Vitals:   05/29/24 2104 05/30/24 0450 05/30/24 0758 05/30/24 0847  BP: (!) 152/62 (!) 114/55 133/61   Pulse: 96 84 84   Resp: (!) 24 20 20    Temp: 98.4 F (36.9 C) 98.1 F (36.7 C) 98.3 F (36.8 C)   TempSrc: Oral  Oral   SpO2: 98% 98% 98% 98%  Weight:      Height:        General - Elderly obese Caucasian male, moderate respiratory distress HEENT - PERRLA, EOMI, atraumatic head, non tender sinuses. Lung - Clear, diffuse rales, rhonchi, wheezes, using accessory muscles while talking Heart - S1, S2 heard, no murmurs, rubs, trace pedal edema. Abdomen - Soft, non tender, bowel sounds good Neuro - Alert, awake and oriented x 3, non focal exam. Skin - Warm and dry.  Data Reviewed:      Latest Ref Rng & Units 05/30/2024    3:51 AM 05/29/2024    2:20 PM 05/29/2024  1:44 PM  CBC  WBC 4.0 - 10.5 K/uL 11.2   11.9   Hemoglobin 13.0 - 17.0 g/dL 89.9  88.0  88.6   Hematocrit 39.0 - 52.0 % 31.1  35.0  34.4   Platelets 150 - 400 K/uL 287   290       Latest Ref Rng & Units 05/30/2024    3:51 AM 05/29/2024    2:20 PM 05/29/2024    1:44 PM  BMP  Glucose 70 - 99 mg/dL 867  885  891   BUN 8 - 23 mg/dL 32  26  28   Creatinine 0.61 - 1.24 mg/dL 8.19  8.09  8.27   Sodium 135 - 145 mmol/L 133  131  129   Potassium 3.5 - 5.1 mmol/L 4.9  4.6  4.9   Chloride 98 - 111 mmol/L 99  97  96    CO2 22 - 32 mmol/L 26   22   Calcium  8.9 - 10.3 mg/dL 8.7   8.9    DG Chest Port 1 View Result Date: 05/29/2024 CLINICAL DATA:  Shortness of breath. EXAM: PORTABLE CHEST 1 VIEW COMPARISON:  05/21/2024 FINDINGS: The heart size and mediastinal contours are within normal limits. Stable chronic lung disease with prominent bilateral parenchymal scarring. There is no evidence of pulmonary edema, consolidation, pneumothorax, nodule or pleural fluid. The visualized skeletal structures are unremarkable. IMPRESSION: Stable chronic lung disease with prominent bilateral parenchymal scarring. No acute findings. Electronically Signed   By: Marcey Moan M.D.   On: 05/29/2024 13:36    Family Communication: Discussed with patient, understand and agree. All questions answered.  Disposition: Status is: Observation The patient remains OBS appropriate and will d/c before 2 midnights.  Planned Discharge Destination: Home with Home Health     Time spent: 43 minutes  Author: Concepcion Riser, MD 05/30/2024 2:09 PM Secure chat 7am to 7pm For on call review [email or web address].

## 2024-05-30 NOTE — Care Management Obs Status (Signed)
 MEDICARE OBSERVATION STATUS NOTIFICATION   Patient Details  Name: Alex Edwards MRN: [id number] Date of Birth: [birth year]   Medicare Observation Status Notification Given:  Yes  Due to the contact precaution spoke with the patients wife via phone will leave a copy of the notice in the patient room.    Leshon Armistead 05/30/2024, 12:34 PM

## 2024-05-30 NOTE — TOC CM/SW Note (Signed)
 Transition of Care [facility]) - Inpatient Brief Assessment   Patient Details  Name: Alex Edwards MRN: [id number] Date of Birth: Jun 05, 1936  [facility] of [facility]) CM/SW Contact:    Lauraine FORBES Saa, LCSWA Phone Number: 05/30/2024, 9:04 AM   Clinical Narrative:  9:04 AM Per chart review, resides at home with spouse. Patient has a PCP and insurance. Patient does not have SNF history. Patient has HH history with [facility] and [facility]. Patient has DME (oxygen , walker, cane) history with [facility] and [facility]. Patient's preferred pharmacy's are [facility] and [facility] [address]. No TOC needs identified at this time. TOC will continue to follow.  Transition of Care Asessment: Insurance and Status: Insurance coverage has been reviewed Patient has primary care physician: Yes Home environment has been reviewed: Private Residence Prior level of function:: N/A Prior/Current Home Services: No current home services Social Drivers of Health Review: SDOH reviewed no interventions necessary Readmission risk has been reviewed: Yes (Currently Observation Status) Transition of care needs: no transition of care needs at this time

## 2024-05-31 DIAGNOSIS — E119 Type 2 diabetes mellitus without complications: Secondary | ICD-10-CM

## 2024-05-31 DIAGNOSIS — C3492 Malignant neoplasm of unspecified part of left bronchus or lung: Secondary | ICD-10-CM | POA: Diagnosis not present

## 2024-05-31 DIAGNOSIS — E038 Other specified hypothyroidism: Secondary | ICD-10-CM

## 2024-05-31 DIAGNOSIS — J205 Acute bronchitis due to respiratory syncytial virus: Secondary | ICD-10-CM | POA: Diagnosis not present

## 2024-05-31 DIAGNOSIS — J441 Chronic obstructive pulmonary disease with (acute) exacerbation: Secondary | ICD-10-CM | POA: Diagnosis not present

## 2024-05-31 LAB — GLUCOSE, CAPILLARY
Glucose-Capillary: 106 mg/dL — ABNORMAL HIGH (ref 70–99)
Glucose-Capillary: 164 mg/dL — ABNORMAL HIGH (ref 70–99)

## 2024-05-31 MED ORDER — PSEUDOEPHEDRINE HCL 30 MG PO TABS
30.0000 mg | ORAL_TABLET | ORAL | Status: DC | PRN
  Administered 2024-05-31: 30 mg via ORAL
  Filled 2024-05-31 (×2): qty 1

## 2024-05-31 MED ORDER — OXYMETAZOLINE HCL 0.05 % NA SOLN: 1.0000 | Freq: Two times a day (BID) | NASAL | 0 refills | Status: AC

## 2024-05-31 MED ORDER — OXYMETAZOLINE HCL 0.05 % NA SOLN
1.0000 | Freq: Two times a day (BID) | NASAL | Status: DC
  Administered 2024-05-31: 1 via NASAL
  Filled 2024-05-31: qty 30

## 2024-05-31 MED ORDER — PSEUDOEPHEDRINE HCL 30 MG PO TABS: 30.0000 mg | ORAL_TABLET | Freq: Four times a day (QID) | ORAL | 0 refills | Status: AC | PRN

## 2024-05-31 MED ORDER — GUAIFENESIN ER 600 MG PO TB12: 600.0000 mg | ORAL_TABLET | Freq: Two times a day (BID) | ORAL | 0 refills | Status: AC

## 2024-05-31 MED ORDER — PREDNISONE 5 MG PO TABS: 5.0000 mg | ORAL_TABLET | Freq: Every day | ORAL | 0 refills | Status: AC

## 2024-05-31 MED ORDER — PREDNISONE 20 MG PO TABS: ORAL_TABLET | ORAL | 0 refills | Status: AC

## 2024-05-31 MED ORDER — IPRATROPIUM-ALBUTEROL 0.5-2.5 (3) MG/3ML IN SOLN: 3.0000 mL | Freq: Four times a day (QID) | RESPIRATORY_TRACT | 3 refills | Status: AC | PRN

## 2024-05-31 NOTE — ED Provider Notes (Signed)
 [facility] GENERAL MED/SURG UNIT Provider Note    CSN: [id number] Arrival date & time: 05/29/24  1221     Patient presents with: Shortness of Breath   Alex Edwards is a 88 y.o. male.   88 year old male with prior medical history as detailed below presents for evaluation.  Patient with known history of lung cancer, emphysema, chronic FiO2 requirement of 2 to 3 L.  He reports increased shortness of breath x 2 to 3 days.  He reports increased wheezing.  He denies fever.  He reports mildly increased cough.  He denies chest pain.  The history is provided by the patient and medical records.       Prior to Admission medications   Medication Sig Start Date End Date Taking? Authorizing Provider  albuterol  (VENTOLIN  HFA) 108 (90 Base) MCG/ACT inhaler Inhale 2 puffs into the lungs every 6 (six) hours as needed for wheezing or shortness of breath. 01/17/24   Joyce Norleen BROCKS, MD  amoxicillin -clavulanate (AUGMENTIN ) 875-125 MG tablet Take 1 tablet by mouth 2 (two) times daily. Patient not taking: Reported on 05/29/2024 05/21/24   Randol Dawes, MD  Ascorbic Acid  (VITAMIN C ) 1000 MG tablet Take 1,000 mg by mouth daily. Patient not taking: Reported on 05/29/2024    [provider]  Blood Glucose Monitoring Suppl (ACCU-CHEK GUIDE) w/Device KIT 1 each by Does not apply route as directed. DX: E11.9 02/09/24   Joyce Norleen BROCKS, MD  Cholecalciferol (VITAMIN D-3) 125 MCG (5000 UT) TABS Take 1 tablet by mouth every other day. Patient not taking: Reported on 05/29/2024    [provider]  clopidogrel  (PLAVIX ) 75 MG tablet Take 1 tablet (75 mg total) by mouth daily. 03/13/24   Joyce Norleen BROCKS, MD  hydrochlorothiazide  (MICROZIDE ) 12.5 MG capsule Take 1 capsule (12.5 mg total) by mouth daily. 12/26/23   Joyce Norleen BROCKS, MD  ketoconazole  (NIZORAL ) 2 % cream Apply topically daily. 01/17/24   Lalonde, John C, MD  levothyroxine  (SYNTHROID ) 50 MCG tablet Take 1 tablet (50 mcg total) by mouth  daily before breakfast. 05/21/24   Lalonde, John C, MD  loratadine  (CLARITIN ) 10 MG tablet Take 10 mg by mouth daily. Patient not taking: Reported on 05/29/2024    [provider]  losartan  (COZAAR ) 100 MG tablet Take 1 tablet (100 mg total) by mouth daily. 01/17/24   Joyce Norleen BROCKS, MD  metFORMIN  (GLUCOPHAGE -XR) 750 MG 24 hr tablet Take 1 tablet (750 mg total) by mouth daily with breakfast. 05/21/24   Lalonde, John C, MD  metoprolol  tartrate (LOPRESSOR ) 50 MG tablet TAKE ONE TABLET BY MOUTH TWICE DAILY 02/24/24   Lalonde, John C, MD  [facility] DELICA LANCETS FINE MISC Patient is to test one time a day DX: E11.9 07/30/16   Joyce Norleen BROCKS, MD  predniSONE  (DELTASONE ) 5 MG tablet Take 1 tablet (5 mg total) by mouth daily with breakfast. 01/17/24   Joyce Norleen BROCKS, MD  tamsulosin  (FLOMAX ) 0.4 MG CAPS capsule TAKE ONE CAPSULE BY MOUTH DAILY AFTER SUPPER 03/26/24   Lalonde, John C, MD  triamcinolone  cream (KENALOG ) 0.1 % APPLY TO AFFECTED AREA TWICE A DAY. Patient not taking: Reported on 05/29/2024 10/14/20   Joyce Norleen BROCKS, MD  rosuvastatin  (CRESTOR ) 10 MG tablet Take 1 tablet (10 mg total) by mouth at bedtime. 01/25/23 03/07/23  Magda Debby SAILOR, MD    Allergies: Paclitaxel , Vancomycin , Amlodipine , and Latex    Review of Systems  All other systems reviewed and are negative.  Updated Vital Signs BP (!) 151/61 (BP Location: Left Arm)   Pulse 79   Temp 98.2 F (36.8 C)   Resp (!) 30   SpO2 98%   Physical Exam Vitals and nursing note reviewed.  Constitutional:      General: He is not in acute distress.    Appearance: He is well-developed.  HENT:     Head: Normocephalic and atraumatic.  Eyes:     Conjunctiva/sclera: Conjunctivae normal.  Cardiovascular:     Rate and Rhythm: Normal rate and regular rhythm.     Heart sounds: No murmur heard. Pulmonary:     Effort: Pulmonary effort is normal. No respiratory distress.     Comments: Scattered bilateral expiratory wheezes in all lung  field Abdominal:     Palpations: Abdomen is soft.     Tenderness: There is no abdominal tenderness.  Musculoskeletal:        General: No swelling.     Cervical back: Neck supple.  Skin:    General: Skin is warm and dry.     Capillary Refill: Capillary refill takes less than 2 seconds.  Neurological:     Mental Status: He is alert.  Psychiatric:        Mood and Affect: Mood normal.     (all labs ordered are listed, but only abnormal results are displayed) Labs Reviewed  RESP PANEL BY RT-PCR (RSV, FLU A&B, COVID)  [id number]  CBC WITH DIFFERENTIAL/PLATELET  BRAIN NATRIURETIC PEPTIDE  COMPREHENSIVE METABOLIC PANEL WITH GFR  URINALYSIS, W/ REFLEX TO CULTURE (INFECTION SUSPECTED)  I-STAT CHEM 8, ED  I-STAT CG4 LACTIC ACID, ED  TROPONIN I (HIGH SENSITIVITY)    EKG: None  Radiology: No results found.   Procedures   Medications Ordered in the ED  methylPREDNISolone  sodium succinate (SOLU-MEDROL ) 125 mg/2 mL injection 125 mg (has no administration in time range)  ipratropium-albuterol  (DUONEB) 0.5-2.5 (3) MG/3ML nebulizer solution 3 mL (3 mLs Nebulization Given 05/29/24 1317)                                    Medical Decision Making Patient presents with increased shortness of breath.  Symptoms most consistent with likely COPD exacerbation.  Screening imaging and labs ordered.  Initial treatment ordered.  Oncoming ED provider aware of case and need for disposition.  Amount and/or Complexity of Data Reviewed Labs: ordered. Radiology: ordered.  Risk Prescription drug management. Decision regarding hospitalization.        Final diagnoses:  Dyspnea, unspecified type    ED Discharge Orders     None          Laurice Maude BROCKS, MD 05/30/24 [id number]    Laurice Maude BROCKS, MD 05/31/24 SHERRIAN   Laurice Maude BROCKS, MD 05/31/24 [phone]

## 2024-05-31 NOTE — Discharge Summary (Incomplete)
 Physician Discharge Summary   Patient: Alex Edwards MRN: [id number] DOB: 04-25-1936  Admit date:     05/29/2024  Discharge date: {dischdate:26783}  Discharge Physician: Concepcion Riser   PCP: Joyce Norleen BROCKS, MD   Recommendations at discharge:  {Tip this will not be part of the note when signed- Example include specific recommendations for outpatient follow-up, pending tests to follow-up on. (Optional):26781}  ***  Discharge Diagnoses: Principal Problem:   RSV (respiratory syncytial virus infection)  Resolved Problems:   * No resolved hospital problems. [facility] Course: No notes on file  Assessment and Plan: No notes have been filed under this hospital service. Service: Hospitalist     {Tip this will not be part of the note when signed Body mass index is 30.54 kg/m. , ,  (Optional):26781}  {(NOTE) Pain control PDMP Statment (Optional):26782} Consultants: *** Procedures performed: ***  Disposition: {Plan; Disposition:26390} Diet recommendation:  Discharge Diet Orders (From admission, onward)     Start     Ordered   05/31/24 0000  Diet - low sodium heart healthy        05/31/24 1159   05/31/24 0000  Diet Carb Modified        05/31/24 1159           {Diet_Plan:26776} DISCHARGE MEDICATION: Allergies as of 05/31/2024       Reactions   Paclitaxel  Shortness Of Breath   SOB and light headedness, flushing and 8/10 pain in left flank Pepcid  and Solumedrol administered.  Able to tolerate remainder of Taxol  infusion   Vancomycin     Red Man Syndrome   Amlodipine  Swelling   Latex Other (See Comments)   possibly allergic- dry, itching, rash        Medication List     TAKE these medications    Accu-Chek Guide w/Device Kit 1 each by Does not apply route as directed. DX: E11.9   albuterol  108 (90 Base) MCG/ACT inhaler Commonly known as: VENTOLIN  HFA Inhale 2 puffs into the lungs every 6 (six) hours as needed for wheezing or shortness of breath.    clopidogrel  75 MG tablet Commonly known as: PLAVIX  Take 1 tablet (75 mg total) by mouth daily.   guaiFENesin 600 MG 12 hr tablet Commonly known as: MUCINEX Take 1 tablet (600 mg total) by mouth 2 (two) times daily for 10 days.   hydrochlorothiazide  12.5 MG capsule Commonly known as: MICROZIDE  Take 1 capsule (12.5 mg total) by mouth daily.   ipratropium-albuterol  0.5-2.5 (3) MG/3ML Soln Commonly known as: DUONEB Take 3 mLs by nebulization every 6 (six) hours as needed.   ketoconazole  2 % cream Commonly known as: NIZORAL  Apply topically daily. What changed:  how much to take when to take this reasons to take this additional instructions   levothyroxine  50 MCG tablet Commonly known as: SYNTHROID  Take 1 tablet (50 mcg total) by mouth daily before breakfast.   loratadine  10 MG tablet Commonly known as: CLARITIN  Take 10 mg by mouth daily.   losartan  100 MG tablet Commonly known as: COZAAR  Take 1 tablet (100 mg total) by mouth daily.   metFORMIN  750 MG 24 hr tablet Commonly known as: GLUCOPHAGE -XR Take 1 tablet (750 mg total) by mouth daily with breakfast.   metoprolol  tartrate 50 MG tablet Commonly known as: LOPRESSOR  TAKE ONE TABLET BY MOUTH TWICE DAILY   OneTouch Delica Lancets Fine Misc Patient is to test one time a day DX: E11.9   oxymetazoline 0.05 % nasal spray Commonly known as:  AFRIN Place 1 spray into both nostrils 2 (two) times daily.   predniSONE  20 MG tablet Commonly known as: DELTASONE  Take 2 tablets (40 mg total) by mouth daily with breakfast for 3 days, THEN 1 tablet (20 mg total) daily with breakfast for 3 days. After 6 days continue 5mg  dose. Start taking on: June 01, 2024 What changed:  medication strength See the new instructions.   predniSONE  5 MG tablet Commonly known as: DELTASONE  Take 1 tablet (5 mg total) by mouth daily with breakfast. Start taking on: June 06, 2024 What changed: You were already taking a medication with the  same name, and this prescription was added. Make sure you understand how and when to take each.   pseudoephedrine 30 MG tablet Commonly known as: SUDAFED Take 1 tablet (30 mg total) by mouth every 6 (six) hours as needed for congestion.   tamsulosin  0.4 MG Caps capsule Commonly known as: FLOMAX  TAKE ONE CAPSULE BY MOUTH DAILY AFTER SUPPER   triamcinolone  cream 0.1 % Commonly known as: KENALOG  APPLY TO AFFECTED AREA TWICE A DAY.   vitamin C  1000 MG tablet Take 1,000 mg by mouth daily.   Vitamin D-3 125 MCG (5000 UT) Tabs Take 1 tablet by mouth every other day.               Durable Medical Equipment  (From admission, onward)           Start     Ordered   05/31/24 0000  For home use only DME Nebulizer machine       Question Answer Comment  Patient needs a nebulizer to treat with the following condition COPD (chronic obstructive pulmonary disease) (HCC)   Length of Need 6 Months   Additional equipment included Administration kit   Additional equipment included Filter      05/31/24 1159   05/31/24 0000  For home use only DME 4 wheeled rolling walker with seat       Question Answer Comment  Patient needs a walker to treat with the following condition Hypoxia   Patient needs a walker to treat with the following condition COPD (chronic obstructive pulmonary disease) (HCC)      05/31/24 1159            Discharge Exam: Filed Weights   05/29/24 1348  Weight: 105 kg   ***  Condition at discharge: {DC Condition:26389}  The results of significant diagnostics from this hospitalization (including imaging, microbiology, ancillary and laboratory) are listed below for reference.   Imaging Studies: DG Chest Port 1 View Result Date: 05/29/2024 CLINICAL DATA:  Shortness of breath. EXAM: PORTABLE CHEST 1 VIEW COMPARISON:  05/21/2024 FINDINGS: The heart size and mediastinal contours are within normal limits. Stable chronic lung disease with prominent bilateral  parenchymal scarring. There is no evidence of pulmonary edema, consolidation, pneumothorax, nodule or pleural fluid. The visualized skeletal structures are unremarkable. IMPRESSION: Stable chronic lung disease with prominent bilateral parenchymal scarring. No acute findings. Electronically Signed   By: Marcey Moan M.D.   On: 05/29/2024 13:36   DG Chest 2 View Result Date: 05/22/2024 CLINICAL DATA:  History of lung cancer with worsening shortness of breath. EXAM: CHEST - 2 VIEW COMPARISON:  Chest x-ray 01/04/2023 and CT scan 07/01/2022 FINDINGS: The cardiac silhouette, mediastinal and hilar contours are within normal limits and stable. Stable appearing area treated lung cancer in the left upper lobe laterally. Stable underlying emphysematous changes and pulmonary scarring. No infiltrates or effusions. No pulmonary edema. No  pneumothorax. The bony thorax is intact. IMPRESSION: 1. Stable appearing treated lung cancer in the left upper lobe laterally. 2. Stable emphysematous changes and pulmonary scarring. 3. No acute pulmonary findings. Electronically Signed   By: MYRTIS Stammer M.D.   On: 05/22/2024 15:02    Microbiology: Results for orders placed or performed during the hospital encounter of 05/29/24  Resp panel by RT-PCR (RSV, Flu A&B, Covid) Anterior Nasal Swab     Status: Abnormal   Collection Time: 05/29/24 12:56 PM   Specimen: Anterior Nasal Swab  Result Value Ref Range Status   SARS Coronavirus 2 by RT PCR NEGATIVE NEGATIVE Final   Influenza A by PCR NEGATIVE NEGATIVE Final   Influenza B by PCR NEGATIVE NEGATIVE Final    Comment: (NOTE) The Xpert Xpress SARS-CoV-2/FLU/RSV plus assay is intended as an aid in the diagnosis of influenza from Nasopharyngeal swab specimens and should not be used as a sole basis for treatment. Nasal washings and aspirates are unacceptable for Xpert Xpress SARS-CoV-2/FLU/RSV testing.  Fact Sheet for Patients: bloggercourse.com  Fact  Sheet for Healthcare Providers: seriousbroker.it  This test is not yet approved or cleared by the [place]  FDA and has been authorized for detection and/or diagnosis of SARS-CoV-2 by FDA under an Emergency Use Authorization (EUA). This EUA will remain in effect (meaning this test can be used) for the duration of the COVID-19 declaration under Section 564(b)(1) of the Act, 21 U.S.C. section 360bbb-3(b)(1), unless the authorization is terminated or revoked.     Resp Syncytial Virus by PCR POSITIVE (A) NEGATIVE Final    Comment: (NOTE) Fact Sheet for Patients: bloggercourse.com  Fact Sheet for Healthcare Providers: seriousbroker.it  This test is not yet approved or cleared by the [place]  FDA and has been authorized for detection and/or diagnosis of SARS-CoV-2 by FDA under an Emergency Use Authorization (EUA). This EUA will remain in effect (meaning this test can be used) for the duration of the COVID-19 declaration under Section 564(b)(1) of the Act, 21 U.S.C. section 360bbb-3(b)(1), unless the authorization is terminated or revoked.  Performed at [facility], [address] [place], [place] [address]     Labs: CBC: Recent Labs  Lab 05/29/24 1344 05/29/24 1420 05/30/24 0351  WBC 11.9*  --  11.2*  NEUTROABS 10.3*  --   --   HGB 11.3* 11.9* 10.0*  HCT 34.4* 35.0* 31.1*  MCV 95.6  --  92.6  PLT 290  --  287   Basic Metabolic Panel: Recent Labs  Lab 05/29/24 1344 05/29/24 1420 05/30/24 0351  NA 129* 131* 133*  K 4.9 4.6 4.9  CL 96* 97* 99  CO2 22  --  26  GLUCOSE 108* 114* 132*  BUN 28* 26* 32*  CREATININE 1.72* 1.90* 1.80*  CALCIUM  8.9  --  8.7*   Liver Function Tests: Recent Labs  Lab 05/29/24 1344  AST 19  ALT 13  ALKPHOS 47  BILITOT 0.4  PROT 6.7  ALBUMIN 3.4*   CBG: Recent Labs  Lab 05/30/24 0815 05/30/24 1241 05/30/24 1719 05/30/24 2120  05/31/24 0801  GLUCAP 126* 132* 148* 136* 106*    Discharge time spent: {LESS THAN/GREATER THAN:26388} 30 minutes.  Signed: Concepcion Riser, MD [facility] 05/31/2024

## 2024-05-31 NOTE — Plan of Care (Signed)
   Problem: Education: Goal: Ability to describe self-care measures that may prevent or decrease complications (Diabetes Survival Skills Education) will improve Outcome: Progressing Goal: Individualized Educational Video(s) Outcome: Progressing   Problem: Coping: Goal: Ability to adjust to condition or change in health will improve Outcome: Progressing   Problem: Fluid Volume: Goal: Ability to maintain a balanced intake and output will improve Outcome: Progressing   Problem: Health Behavior/Discharge Planning: Goal: Ability to identify and utilize available resources and services will improve Outcome: Progressing Goal: Ability to manage health-related needs will improve Outcome: Progressing   Problem: Metabolic: Goal: Ability to maintain appropriate glucose levels will improve Outcome: Progressing   Problem: Skin Integrity: Goal: Risk for impaired skin integrity will decrease Outcome: Progressing

## 2024-05-31 NOTE — TOC Transition Note (Addendum)
 Transition of Care [facility]) - Discharge Note   Patient Details  Name: Alex Edwards MRN: [id number] Date of Birth: [birth year]  Transition of Care [facility]) CM/SW Contact:  Roxie KANDICE Stain, RN Phone Number: 05/31/2024, 12:24 PM   Clinical Narrative:   Patient stable for discharge.  Patient declines home health at this time.  Patient has walker at home.  Patient to schedule follow up apt. Nebulizer machine ordered from [employer] to be delivered to the home.   Final next level of care: Home/Self Care Barriers to Discharge: Barriers Resolved   Patient Goals and CMS Choice Patient states their goals for this hospitalization and ongoing recovery are:: return home          Discharge Placement                 home      Discharge Plan and Services Additional resources added to the After Visit Summary for                                       Social Drivers of Health (SDOH) Interventions SDOH Screenings   Food Insecurity: No Food Insecurity (05/29/2024)  Housing: Low Risk  (05/29/2024)  Transportation Needs: No Transportation Needs (05/29/2024)  Utilities: Not At Risk (05/29/2024)  Alcohol  Screen: Low Risk  (08/30/2023)  Depression (PHQ2-9): Low Risk  (03/13/2024)  Financial Resource Strain: Medium Risk (05/21/2024)  Physical Activity: Insufficiently Active (05/21/2024)  Social Connections: Unknown (05/29/2024)  Recent Concern: Social Connections - Moderately Isolated (05/21/2024)  Stress: No Stress Concern Present (08/30/2023)  Tobacco Use: Medium Risk (05/29/2024)  Health Literacy: Adequate Health Literacy (08/30/2023)     Readmission Risk Interventions     No data to display

## 2024-05-31 NOTE — Evaluation (Signed)
 Occupational Therapy Evaluation Patient Details Name: Alex Edwards MRN: [id number] DOB: 1936-06-27 Today's Date: 05/31/2024   History of Present Illness   88 y.o. male admitted on 12/2 for dyspnea for 2-3 days. He is on 3L [place] at baseline. Labs positive for acute RSV. Med hx significant for lung cancer, emphysema,  DM, colon polyps, obesity, psoriasis, smoker, macular degeneration R eye, basal cell epithelioma, HTN, PMR.     Clinical Impressions PTA, pt lives with spouse and son, reports ambulatory with intermittent cane use and Modified Independent with ADLs though does require increased time d/t respiratory issues. Pt presents now with deficits in strength, cardiopulmonary endurance and dynamic standing balance. Pt requires overall Supervision for ADLs and transfers using RW. Pt reports nose is stuffy and breathing more difficult today. Provided energy conservation strategy handout and discussed implementation into daily routine. Recommend pt use RW for mobility around the home initially. Also recommend HHOT follow up though pt likely to politely decline.   SpO2 95% on 3 L O2     If plan is discharge home, recommend the following:   A little help with bathing/dressing/bathroom;Assistance with cooking/housework     Functional Status Assessment   Patient has had a recent decline in their functional status and demonstrates the ability to make significant improvements in function in a reasonable and predictable amount of time.     Equipment Recommendations   None recommended by OT     Recommendations for Other Services         Precautions/Restrictions   Precautions Precautions: Fall Recall of Precautions/Restrictions: Intact Precaution/Restrictions Comments: monitor O2/DOE (3 L O2 at baseline) Restrictions Weight Bearing Restrictions Per Provider Order: No     Mobility Bed Mobility Overal bed mobility: Modified Independent Bed Mobility: Supine to Sit                 Transfers Overall transfer level: Needs assistance Equipment used: Rolling walker (2 wheels) Transfers: Sit to/from Stand Sit to Stand: Supervision                  Balance Overall balance assessment: Needs assistance Sitting-balance support: No upper extremity supported, Feet supported Sitting balance-Leahy Scale: Good     Standing balance support: Bilateral upper extremity supported, No upper extremity supported, During functional activity Standing balance-Leahy Scale: Fair                             ADL either performed or assessed with clinical judgement   ADL Overall ADL's : Needs assistance/impaired Eating/Feeding: Independent   Grooming: Supervision/safety;Standing   Upper Body Bathing: Modified independent   Lower Body Bathing: Supervison/ safety   Upper Body Dressing : Modified independent   Lower Body Dressing: Supervision/safety   Toilet Transfer: Supervision/safety;Stand-pivot;BSC/3in1;Rolling walker (2 wheels)   Toileting- Clothing Manipulation and Hygiene: Supervision/safety         General ADL Comments: Reports DOE/stuffy nose but SpO2 WFL with personal pulse ox in room. provided energy conservation strategies handout and discussed current ADL routine     Vision Ability to See in Adequate Light: 0 Adequate Patient Visual Report: No change from baseline Vision Assessment?: No apparent visual deficits     Perception         Praxis         Pertinent Vitals/Pain Pain Assessment Pain Assessment: No/denies pain     Extremity/Trunk Assessment Upper Extremity Assessment Upper Extremity Assessment: Overall WFL for tasks assessed;Right hand  dominant   Lower Extremity Assessment Lower Extremity Assessment: Defer to PT evaluation   Cervical / Trunk Assessment Cervical / Trunk Assessment: Kyphotic   Communication Communication Communication: No apparent difficulties   Cognition Arousal: Alert Behavior During  Therapy: WFL for tasks assessed/performed Cognition: No apparent impairments                               Following commands: Intact       Cueing  General Comments   Cueing Techniques: Verbal cues      Exercises     Shoulder Instructions      Home Living Family/patient expects to be discharged to:: Private residence Living Arrangements: Spouse/significant other;Children Available Help at Discharge: Family;Available 24 hours/day Type of Home: House Home Access: Stairs to enter [employer] of Steps: 2 Entrance Stairs-Rails: None Home Layout: Two level;Laundry or work area in basement;Able to live on main level with bedroom/bathroom (does not go down to basement)     Bathroom Shower/Tub: Chief Strategy Officer: Standard Bathroom Accessibility: Yes   Home Equipment: Cane - single Librarian, Academic (2 wheels);Shower seat   Additional Comments: 3 L O2 at home, reports quick desats without O2 with activity      Prior Functioning/Environment Prior Level of Function : Needs assist;History of Falls (last six months)             Mobility Comments: Pt uses an AD intermittently, typically a SPC. Has a RW, but does use it. [place] about one month ago, stood got dizzy and fell. Did not seriously injure himself. States he only walks around the house occasionally and he is fairly sedentary. ADLs Comments: Reports able to manage ADLs but has to take breaks and takes increased time to complete. Son assists with IADLs, transportation    OT Problem List: Decreased strength;Decreased activity tolerance;Impaired balance (sitting and/or standing);Cardiopulmonary status limiting activity   OT Treatment/Interventions: Self-care/ADL training;Therapeutic exercise;Energy conservation;DME and/or AE instruction;Therapeutic activities;Patient/family education;Balance training      OT Goals(Current goals can be found in the care plan section)   Acute  Rehab OT Goals Patient Stated Goal: get rid of stuffy nose, improve breathing OT Goal Formulation: With patient Time For Goal Achievement: 06/14/24 Potential to Achieve Goals: Good   OT Frequency:  Min 2X/week    Co-evaluation              AM-PAC OT 6 Clicks Daily Activity     Outcome Measure Help from another person eating meals?: None Help from another person taking care of personal grooming?: A Little Help from another person toileting, which includes using toliet, bedpan, or urinal?: A Little Help from another person bathing (including washing, rinsing, drying)?: A Little Help from another person to put on and taking off regular upper body clothing?: None Help from another person to put on and taking off regular lower body clothing?: A Little 6 Click Score: 20   End of Session Equipment Utilized During Treatment: Rolling walker (2 wheels);Oxygen  Nurse Communication: Other (comment) (stuffy nose/breathing treatment request)  Activity Tolerance: Patient limited by fatigue Patient left: in bed;with call bell/phone within reach  OT Visit Diagnosis: Muscle weakness (generalized) (M62.81)                Time: 9272-9253 OT Time Calculation (min): 19 min Charges:  OT General Charges $OT Visit: 1 Visit OT Evaluation $OT Eval Low Complexity: 1 Low  Mikaili Flippin B, OTR/L Acute  Rehab Services Office: [phone]   Mliss Fish 05/31/2024, 7:58 AM

## 2024-05-31 NOTE — Progress Notes (Signed)
 DISCHARGE NOTE HOME ESIQUIO BOESEN Sr to be discharged Home per MD order. Discussed prescriptions and follow up appointments with the patient. Prescriptions given to patient; medication list explained in detail. Patient verbalized understanding.  Skin clean, dry and intact without evidence of skin break down, no evidence of skin tears noted. IV catheter discontinued intact. Site without signs and symptoms of complications. Dressing and pressure applied. Pt denies pain at the site currently. No complaints noted.  Patient free of lines, drains, and wounds.   An After Visit Summary (AVS) was printed and given to the patient. Patient escorted via wheelchair, and discharged home via private auto.  Peyton SHAUNNA Pepper, RN

## 2024-06-01 DIAGNOSIS — J441 Chronic obstructive pulmonary disease with (acute) exacerbation: Secondary | ICD-10-CM | POA: Insufficient documentation

## 2024-06-01 NOTE — Discharge Summary (Signed)
 Physician Discharge Summary   Patient: Alex Edwards MRN: [id number] DOB: [birth year]  Admit date:     05/29/2024  Discharge date: 05/31/2024  Discharge Physician: Concepcion Riser   PCP: Joyce Norleen BROCKS, MD   Recommendations at discharge:    PCP follow up in 1 week.  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 *  Hospital Course: Alex Edwards is a 88 y/o M with PMH of lung cancer s/p chemotherapy finished in 2022 does not follow with oncology, chronic hypoxic respiratory failure on 2-3L oxygen , recent episode of symptomatic nephrolithiasis with transient hematuria status post 2 days on antibiotics, CVA on plavix , right coronary artery stenosis, T2DM, HTN, recent AKI on CKD (baseline around 2), and remote tobacco use presented with progressive shortness of breath of 1 day duration.  Patient was admitted to the hospitalist service for acute COPD exacerbation, RSV infection.   Assessment and Plan: Acute COPD exacerbation RSV infection- Patient does have cough, shortness of breath, generalized weakness. Continue DuoNebs Q6 hourly, antitussive  medications. Continue prednisone  40 mg total 5 days. Encourage incentive spirometry, out of bed to chair. Weaned oxygen  to baseline 2 to 3 L. Advised duonebs, prednisone  taper upon discharge. Understands to follow up with PCP, Oncology upon discharge. Advised pulmonology evaluation as outpatient. He decline HH services at home.   Hypoxia: In the setting of COPD, RSV infection. Weaned supplemental oxygen  to baseline 2 to 3 L. Nebulizer machine prescribed along with duonebs.  Hyponatremia: Seems chronic, baseline.    Type 2 diabetes mellitus: Treated with Accu-Cheks, sliding scale insulin . Resumed metformin  therapy on discharge.   History of carotid artery stenosis CVA with no residual neurodeficits. Continue home Plavix  therapy.   Hyperlipidemia- Continue statin therapy.   Hypertension- Resumed losartan , hydrochlorothiazide  therapy.   Bilateral shoulder arthritis- Continue prednisone  40 mg for 3 days and then 20mg  for 3 days after which he can restart home 5 mg dose daily.      Consultants: none Procedures performed: none  Disposition: Home Diet recommendation:  Discharge Diet Orders (From admission, onward)     Start     Ordered   05/31/24 0000  Diet - low sodium heart healthy        05/31/24 1159   05/31/24 0000  Diet Carb Modified        05/31/24 1159           Cardiac and Carb modified diet DISCHARGE MEDICATION: Allergies as of 05/31/2024       Reactions   Paclitaxel  Shortness Of Breath   SOB and light headedness, flushing and 8/10 pain in left flank Pepcid  and Solumedrol administered.  Able to tolerate remainder of Taxol  infusion   Vancomycin     Red Man Syndrome   Amlodipine  Swelling   Latex Other (See Comments)   possibly allergic- dry, itching, rash        Medication List     TAKE these medications    Accu-Chek Guide w/Device Kit 1 each by Does not apply route as directed. DX: E11.9   albuterol  108 (90 Base) MCG/ACT inhaler Commonly known as: VENTOLIN  HFA Inhale 2 puffs into the lungs every 6 (six) hours as needed for wheezing or shortness  of breath.   clopidogrel  75 MG tablet Commonly known as: PLAVIX  Take 1 tablet (75 mg total) by mouth daily.   guaiFENesin  600 MG 12 hr tablet Commonly known as: MUCINEX  Take 1 tablet (600 mg total) by mouth 2 (two) times daily for 10 days.   hydrochlorothiazide  12.5 MG capsule Commonly known as: MICROZIDE  Take 1 capsule (12.5 mg total) by mouth daily.   ipratropium-albuterol  0.5-2.5 (3) MG/3ML  Soln Commonly known as: DUONEB Take 3 mLs by nebulization every 6 (six) hours as needed.   ketoconazole  2 % cream Commonly known as: NIZORAL  Apply topically daily. What changed:  how much to take when to take this reasons to take this additional instructions   levothyroxine  50 MCG tablet Commonly known as: SYNTHROID  Take 1 tablet (50 mcg total) by mouth daily before breakfast.   loratadine  10 MG tablet Commonly known as: CLARITIN  Take 10 mg by mouth daily.   losartan  100 MG tablet Commonly known as: COZAAR  Take 1 tablet (100 mg total) by mouth daily.   metFORMIN  750 MG 24 hr tablet Commonly known as: GLUCOPHAGE -XR Take 1 tablet (750 mg total) by mouth daily with breakfast.   metoprolol  tartrate 50 MG tablet Commonly known as: LOPRESSOR  TAKE ONE TABLET BY MOUTH TWICE DAILY   OneTouch Delica Lancets Fine Misc Patient is to test one time a day DX: E11.9   oxymetazoline  0.05 % nasal spray Commonly known as: AFRIN Place 1 spray into both nostrils 2 (two) times daily.   predniSONE  20 MG tablet Commonly known as: DELTASONE  Take 2 tablets (40 mg total) by mouth daily with breakfast for 3 days, THEN 1 tablet (20 mg total) daily with breakfast for 3 days. After 6 days continue 5mg  dose. Start taking on: June 01, 2024 What changed:  medication strength See the new instructions.   predniSONE  5 MG tablet Commonly known as: DELTASONE  Take 1 tablet (5 mg total) by mouth daily with breakfast. Start taking on: June 06, 2024 What changed: You were already taking a medication with the same name, and this prescription was added. Make sure you understand how and when to take each.   pseudoephedrine  30 MG tablet Commonly known as: SUDAFED Take 1 tablet (30 mg total) by mouth every 6 (six) hours as needed for congestion.   tamsulosin  0.4 MG Caps capsule Commonly known as: FLOMAX  TAKE ONE CAPSULE BY MOUTH DAILY AFTER SUPPER   triamcinolone  cream 0.1 % Commonly known as:  KENALOG  APPLY TO AFFECTED AREA TWICE A DAY.   vitamin C  1000 MG tablet Take 1,000 mg by mouth daily.   Vitamin D -3 125 MCG (5000 UT) Tabs Take 1 tablet by mouth every other day.               Durable Medical Equipment  (From admission, onward)           Start     Ordered   05/31/24 0000  For home use only DME Nebulizer machine       Question Answer Comment  Patient needs a nebulizer to treat with the following condition COPD (chronic obstructive pulmonary disease) (HCC)   Length of Need 6 Months   Additional equipment included Administration kit   Additional equipment included Filter      05/31/24 1159   05/31/24 0000  For home use only DME 4 wheeled rolling walker with seat       Question Answer Comment  Patient needs a walker to treat with the following condition Hypoxia  Patient needs a walker to treat with the following condition COPD (chronic obstructive pulmonary disease) (HCC)      05/31/24 1159            Follow-up Information     Joyce Norleen BROCKS, MD Follow up in 2 day(s).   Specialty: Family Medicine Why: Hospital follow up Contact information: [address] [place] [address] [phone]                Discharge Exam: Filed Weights   05/29/24 1348  Weight: 105 kg      05/31/2024    7:57 AM 05/31/2024    5:09 AM 05/30/2024    8:53 PM  Vitals with BMI  Systolic 152 101 849  Diastolic 85 89 76  Pulse 91 79 90    General - Elderly obese Caucasian male, mild respiratory distress HEENT - PERRLA, EOMI, atraumatic head, non tender sinuses. Lung - Clear, diffuse rales, rhonchi, wheezes. Heart - S1, S2 heard, no murmurs, rubs, trace pedal edema. Abdomen - Soft, non tender, bowel sounds good Neuro - Alert, awake and oriented x 3, non focal exam. Skin - Warm and dry.  Condition at discharge: stable  The results of significant diagnostics from this hospitalization (including imaging, microbiology, ancillary and  laboratory) are listed below for reference.   Imaging Studies: DG Chest Port 1 View Result Date: 05/29/2024 CLINICAL DATA:  Shortness of breath. EXAM: PORTABLE CHEST 1 VIEW COMPARISON:  05/21/2024 FINDINGS: The heart size and mediastinal contours are within normal limits. Stable chronic lung disease with prominent bilateral parenchymal scarring. There is no evidence of pulmonary edema, consolidation, pneumothorax, nodule or pleural fluid. The visualized skeletal structures are unremarkable. IMPRESSION: Stable chronic lung disease with prominent bilateral parenchymal scarring. No acute findings. Electronically Signed   By: Marcey Moan M.D.   On: 05/29/2024 13:36   DG Chest 2 View Result Date: 05/22/2024 CLINICAL DATA:  History of lung cancer with worsening shortness of breath. EXAM: CHEST - 2 VIEW COMPARISON:  Chest x-ray 01/04/2023 and CT scan 07/01/2022 FINDINGS: The cardiac silhouette, mediastinal and hilar contours are within normal limits and stable. Stable appearing area treated lung cancer in the left upper lobe laterally. Stable underlying emphysematous changes and pulmonary scarring. No infiltrates or effusions. No pulmonary edema. No pneumothorax. The bony thorax is intact. IMPRESSION: 1. Stable appearing treated lung cancer in the left upper lobe laterally. 2. Stable emphysematous changes and pulmonary scarring. 3. No acute pulmonary findings. Electronically Signed   By: MYRTIS Stammer M.D.   On: 05/22/2024 15:02    Microbiology: Results for orders placed or performed during the hospital encounter of 05/29/24  Resp panel by RT-PCR (RSV, Flu A&B, Covid) Anterior Nasal Swab     Status: Abnormal   Collection Time: 05/29/24 12:56 PM   Specimen: Anterior Nasal Swab  Result Value Ref Range Status   SARS Coronavirus 2 by RT PCR NEGATIVE NEGATIVE Final   Influenza A by PCR NEGATIVE NEGATIVE Final   Influenza B by PCR NEGATIVE NEGATIVE Final    Comment: (NOTE) The Xpert Xpress  SARS-CoV-2/FLU/RSV plus assay is intended as an aid in the diagnosis of influenza from Nasopharyngeal swab specimens and should not be used as a sole basis for treatment. Nasal washings and aspirates are unacceptable for Xpert Xpress SARS-CoV-2/FLU/RSV testing.  Fact Sheet for Patients: bloggercourse.com  Fact Sheet for Healthcare Providers: seriousbroker.it  This test is not yet approved or cleared by the [place]  FDA and has been authorized for  detection and/or diagnosis of SARS-CoV-2 by FDA under an Emergency Use Authorization (EUA). This EUA will remain in effect (meaning this test can be used) for the duration of the COVID-19 declaration under Section 564(b)(1) of the Act, 21 U.S.C. section 360bbb-3(b)(1), unless the authorization is terminated or revoked.     Resp Syncytial Virus by PCR POSITIVE (A) NEGATIVE Final    Comment: (NOTE) Fact Sheet for Patients: bloggercourse.com  Fact Sheet for Healthcare Providers: seriousbroker.it  This test is not yet approved or cleared by the [place]  FDA and has been authorized for detection and/or diagnosis of SARS-CoV-2 by FDA under an Emergency Use Authorization (EUA). This EUA will remain in effect (meaning this test can be used) for the duration of the COVID-19 declaration under Section 564(b)(1) of the Act, 21 U.S.C. section 360bbb-3(b)(1), unless the authorization is terminated or revoked.  Performed at [facility], [address] [place], [place] [address]     Labs: CBC: Recent Labs  Lab 05/29/24 1344 05/29/24 1420 05/30/24 0351  WBC 11.9*  --  11.2*  NEUTROABS 10.3*  --   --   HGB 11.3* 11.9* 10.0*  HCT 34.4* 35.0* 31.1*  MCV 95.6  --  92.6  PLT 290  --  287   Basic Metabolic Panel: Recent Labs  Lab 05/29/24 1344 05/29/24 1420 05/30/24 0351  NA 129* 131* 133*  K 4.9 4.6 4.9  CL 96* 97* 99   CO2 22  --  26  GLUCOSE 108* 114* 132*  BUN 28* 26* 32*  CREATININE 1.72* 1.90* 1.80*  CALCIUM  8.9  --  8.7*   Liver Function Tests: Recent Labs  Lab 05/29/24 1344  AST 19  ALT 13  ALKPHOS 47  BILITOT 0.4  PROT 6.7  ALBUMIN 3.4*   CBG: Recent Labs  Lab 05/30/24 1241 05/30/24 1719 05/30/24 2120 05/31/24 0801 05/31/24 1220  GLUCAP 132* 148* 136* 106* 164*    Discharge time spent: 35 minutes.  Signed: Concepcion Riser, MD [facility] 06/01/2024

## 2024-06-14 ENCOUNTER — Other Ambulatory Visit: Payer: Self-pay | Admitting: Family Medicine

## 2024-06-14 DIAGNOSIS — E1159 Type 2 diabetes mellitus with other circulatory complications: Secondary | ICD-10-CM

## 2024-06-19 ENCOUNTER — Telehealth: Payer: Self-pay

## 2024-06-19 NOTE — Telephone Encounter (Signed)
 Copied from CRM #[id number]. Topic: General - Other >> Jun 19, 2024 10:58 AM Gustabo D wrote: Pt want a call back from [place]

## 2024-06-26 NOTE — Telephone Encounter (Signed)
 Pt called

## 2024-07-17 ENCOUNTER — Ambulatory Visit: Payer: Self-pay | Admitting: Family Medicine

## 2024-07-17 ENCOUNTER — Encounter: Payer: Self-pay | Admitting: Family Medicine

## 2024-07-17 VITALS — BP 132/78 | HR 84 | Wt 257.8 lb

## 2024-07-17 DIAGNOSIS — G629 Polyneuropathy, unspecified: Secondary | ICD-10-CM

## 2024-07-17 DIAGNOSIS — J9611 Chronic respiratory failure with hypoxia: Secondary | ICD-10-CM | POA: Diagnosis not present

## 2024-07-17 DIAGNOSIS — Z9981 Dependence on supplemental oxygen: Secondary | ICD-10-CM | POA: Diagnosis not present

## 2024-07-17 DIAGNOSIS — E119 Type 2 diabetes mellitus without complications: Secondary | ICD-10-CM

## 2024-07-17 LAB — POCT GLYCOSYLATED HEMOGLOBIN (HGB A1C): Hemoglobin A1C: 6.1 % — AB (ref 4.0–5.6)

## 2024-07-17 MED ORDER — METFORMIN HCL ER 750 MG PO TB24: 750.0000 mg | ORAL_TABLET | Freq: Every day | ORAL | 1 refills | Status: AC

## 2024-07-17 MED ORDER — GABAPENTIN 300 MG PO CAPS: 300.0000 mg | ORAL_CAPSULE | Freq: Every evening | ORAL | 1 refills | Status: AC

## 2024-07-17 NOTE — Progress Notes (Signed)
" ° °  Name: Alex Edwards   Date of Visit: 07/17/24   Date of last visit with me: Visit date not found   CHIEF COMPLAINT:  Chief Complaint  Patient presents with   Follow-up    4 month follow up on diabetes.        HPI:  Discussed the use of AI scribe software for clinical note transcription with the patient, who gave verbal consent to proceed.  History of Present Illness   Alex Edwards is an 89 year old male with respiratory issues and diabetes who presents for follow-up after recent hospitalization for RSV.  He was recently hospitalized due to RSV, which caused significant respiratory distress. Since discharge, he has been using a nebulizer once or twice daily, which takes about an hour or two to start working but provides relief. He is currently out of Nuclemax, which he started taking after his hospital discharge. He experiences shortness of breath but denies wheezing. He did not experience shortness of breath this morning and attributes some breathing difficulties to being 'gestured up'.  He has diabetes and regularly monitors his blood sugar levels. This morning, his blood sugar was 130, which he attributes to eating peanut butter and crackers the previous night. He usually maintains his blood sugar around 100 to 105. He is currently taking metformin  and recently refilled his prescription, which he receives in a three-month supply.  He experiences neuropathy, which he describes as one of his biggest problems, affecting his mobility and balance. He does not report pain but mentions difficulty standing up and performing activities. He performs exercises daily, such as going up and down on his toes, to help with his condition. He is not currently taking any medication for neuropathy.         OBJECTIVE:       03/13/2024   11:12 AM  Depression screen PHQ 2/9  Decreased Interest 0  Down, Depressed, Hopeless 0  PHQ - 2 Score 0     BP Readings from Last 3 Encounters:   07/17/24 132/78  05/31/24 (!) 152/85  05/29/24 122/64    BP 132/78   Pulse 84   Wt 257 lb 12.8 oz (116.9 kg)   SpO2 94%   BMI 34.01 kg/m    Physical Exam          Physical Exam  ASSESSMENT/PLAN:   Assessment & Plan Controlled type 2 diabetes mellitus without complication, without long-term current use of insulin  (HCC)  Neuropathy  Chronic hypoxic respiratory failure, on home oxygen  therapy (HCC)    Assessment and Plan    Chronic hypoxic respiratory failure Recent RSV infection exacerbated condition. Shortness of breath persists, relieved by nebulizer. - Continue nebulizer treatment once or twice daily as needed.  Type 2 diabetes mellitus Blood glucose levels slightly elevated post-meal. On metformin  with recent refill. - Ordered A1c test. A1C improved, 6.1  - Refilled metformin  prescription.  Polyneuropathy Mobility issues and numbness present. Discussed gabapentin  for symptom relief. - Initiated gabapentin  at low dose at night. - Increase to two pills at night if needed. - Scheduled follow-up in three months.         Alex Edwards A. Vita MD [facility] "

## 2024-07-17 NOTE — Patient Instructions (Signed)
 VISIT SUMMARY:  During your visit, we discussed your recent hospitalization for RSV and your ongoing management of respiratory issues, diabetes, and neuropathy. We reviewed your current treatments and made some adjustments to help manage your symptoms more effectively.  YOUR PLAN:  -CHRONIC HYPOXIC RESPIRATORY FAILURE: This condition means your body is not getting enough oxygen , which can cause difficulty breathing. Your recent RSV infection made this worse. Continue using your nebulizer once or twice daily as needed to help relieve your shortness of breath.  -TYPE 2 DIABETES MELLITUS: This is a condition where your body does not use insulin  properly, leading to high blood sugar levels. Your blood sugar levels were slightly elevated after eating peanut butter and crackers. We have ordered an A1c test to check your average blood sugar levels over the past few months and refilled your metformin  prescription.  -POLYNEUROPATHY: This condition affects your nerves, causing numbness and mobility issues. We discussed starting gabapentin  to help relieve your symptoms. Begin with a low dose at of one pill 1 at night and increase to two pills at night if needed. We will follow up in three months to see how you are doing.  INSTRUCTIONS:  Please continue using your nebulizer as needed for your respiratory issues. Take your metformin  as prescribed and monitor your blood sugar levels regularly. Start taking gabapentin  at a low dose at night for your neuropathy, and increase to two pills at night if necessary. We will follow up in three months to assess your progress. Additionally, we have ordered an A1c test to monitor your diabetes management.

## 2024-09-11 ENCOUNTER — Ambulatory Visit: Payer: Self-pay

## 2024-10-15 ENCOUNTER — Ambulatory Visit: Admitting: Family Medicine
